# Patient Record
Sex: Male | Born: 1960 | Race: Black or African American | Hispanic: No | Marital: Married | Smoking: Former smoker
Health system: Southern US, Community
[De-identification: ages and names within clinical notes are randomized; demographics above are authoritative.]

## PROBLEM LIST (undated history)

## (undated) ENCOUNTER — Emergency Department (HOSPITAL_COMMUNITY): Discharge status: Absent without leave | Payer: Medicare Other

## (undated) DIAGNOSIS — M549 Dorsalgia, unspecified: Secondary | ICD-10-CM

## (undated) DIAGNOSIS — M1990 Unspecified osteoarthritis, unspecified site: Secondary | ICD-10-CM

## (undated) DIAGNOSIS — R51 Headache: Secondary | ICD-10-CM

## (undated) DIAGNOSIS — I82409 Acute embolism and thrombosis of unspecified deep veins of unspecified lower extremity: Secondary | ICD-10-CM

## (undated) DIAGNOSIS — E785 Hyperlipidemia, unspecified: Secondary | ICD-10-CM

## (undated) DIAGNOSIS — I10 Essential (primary) hypertension: Secondary | ICD-10-CM

## (undated) DIAGNOSIS — D649 Anemia, unspecified: Secondary | ICD-10-CM

## (undated) DIAGNOSIS — I509 Heart failure, unspecified: Secondary | ICD-10-CM

## (undated) DIAGNOSIS — I219 Acute myocardial infarction, unspecified: Secondary | ICD-10-CM

## (undated) DIAGNOSIS — N189 Chronic kidney disease, unspecified: Secondary | ICD-10-CM

## (undated) DIAGNOSIS — H548 Legal blindness, as defined in USA: Secondary | ICD-10-CM

## (undated) DIAGNOSIS — E119 Type 2 diabetes mellitus without complications: Secondary | ICD-10-CM

## (undated) DIAGNOSIS — K219 Gastro-esophageal reflux disease without esophagitis: Secondary | ICD-10-CM

## (undated) DIAGNOSIS — R519 Headache, unspecified: Secondary | ICD-10-CM

## (undated) DIAGNOSIS — Z9289 Personal history of other medical treatment: Secondary | ICD-10-CM

## (undated) HISTORY — PX: KIDNEY TRANSPLANT: SHX239

## (undated) HISTORY — PX: EYE SURGERY: SHX253

## (undated) HISTORY — DX: Chronic kidney disease, unspecified: N18.9

## (undated) HISTORY — DX: Essential (primary) hypertension: I10

## (undated) HISTORY — PX: OTHER SURGICAL HISTORY: SHX169

## (undated) HISTORY — DX: Heart failure, unspecified: I50.9

## (undated) HISTORY — DX: Hyperlipidemia, unspecified: E78.5

## (undated) HISTORY — PX: AV FISTULA PLACEMENT: SHX1204

## (undated) HISTORY — DX: Type 2 diabetes mellitus without complications: E11.9

## (undated) HISTORY — PX: NEPHRECTOMY TRANSPLANTED ORGAN: SUR880

## (undated) HISTORY — PX: REFRACTIVE SURGERY: SHX103

## (undated) HISTORY — PX: COLONOSCOPY: SHX174

---
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
## 2012-06-19 DIAGNOSIS — H3343 Traction detachment of retina, bilateral: Secondary | ICD-10-CM | POA: Insufficient documentation

## 2016-08-13 DIAGNOSIS — N2581 Secondary hyperparathyroidism of renal origin: Secondary | ICD-10-CM | POA: Diagnosis not present

## 2016-08-13 DIAGNOSIS — N186 End stage renal disease: Secondary | ICD-10-CM | POA: Diagnosis not present

## 2016-08-13 DIAGNOSIS — D631 Anemia in chronic kidney disease: Secondary | ICD-10-CM | POA: Diagnosis not present

## 2016-08-15 DIAGNOSIS — N186 End stage renal disease: Secondary | ICD-10-CM | POA: Diagnosis not present

## 2016-08-15 DIAGNOSIS — D631 Anemia in chronic kidney disease: Secondary | ICD-10-CM | POA: Diagnosis not present

## 2016-08-15 DIAGNOSIS — N2581 Secondary hyperparathyroidism of renal origin: Secondary | ICD-10-CM | POA: Diagnosis not present

## 2016-08-20 DIAGNOSIS — D631 Anemia in chronic kidney disease: Secondary | ICD-10-CM | POA: Diagnosis not present

## 2016-08-20 DIAGNOSIS — N2581 Secondary hyperparathyroidism of renal origin: Secondary | ICD-10-CM | POA: Diagnosis not present

## 2016-08-20 DIAGNOSIS — N186 End stage renal disease: Secondary | ICD-10-CM | POA: Diagnosis not present

## 2016-08-22 DIAGNOSIS — N186 End stage renal disease: Secondary | ICD-10-CM | POA: Diagnosis not present

## 2016-08-22 DIAGNOSIS — N2581 Secondary hyperparathyroidism of renal origin: Secondary | ICD-10-CM | POA: Diagnosis not present

## 2016-08-22 DIAGNOSIS — D631 Anemia in chronic kidney disease: Secondary | ICD-10-CM | POA: Diagnosis not present

## 2016-08-24 DIAGNOSIS — N186 End stage renal disease: Secondary | ICD-10-CM | POA: Diagnosis not present

## 2016-08-24 DIAGNOSIS — N2581 Secondary hyperparathyroidism of renal origin: Secondary | ICD-10-CM | POA: Diagnosis not present

## 2016-08-24 DIAGNOSIS — D631 Anemia in chronic kidney disease: Secondary | ICD-10-CM | POA: Diagnosis not present

## 2016-08-27 DIAGNOSIS — N186 End stage renal disease: Secondary | ICD-10-CM | POA: Diagnosis not present

## 2016-08-27 DIAGNOSIS — N2581 Secondary hyperparathyroidism of renal origin: Secondary | ICD-10-CM | POA: Diagnosis not present

## 2016-08-27 DIAGNOSIS — D631 Anemia in chronic kidney disease: Secondary | ICD-10-CM | POA: Diagnosis not present

## 2016-08-29 DIAGNOSIS — N186 End stage renal disease: Secondary | ICD-10-CM | POA: Diagnosis not present

## 2016-08-29 DIAGNOSIS — D631 Anemia in chronic kidney disease: Secondary | ICD-10-CM | POA: Diagnosis not present

## 2016-08-29 DIAGNOSIS — N2581 Secondary hyperparathyroidism of renal origin: Secondary | ICD-10-CM | POA: Diagnosis not present

## 2016-08-31 DIAGNOSIS — N2581 Secondary hyperparathyroidism of renal origin: Secondary | ICD-10-CM | POA: Diagnosis not present

## 2016-08-31 DIAGNOSIS — N186 End stage renal disease: Secondary | ICD-10-CM | POA: Diagnosis not present

## 2016-08-31 DIAGNOSIS — D631 Anemia in chronic kidney disease: Secondary | ICD-10-CM | POA: Diagnosis not present

## 2016-09-03 DIAGNOSIS — N2581 Secondary hyperparathyroidism of renal origin: Secondary | ICD-10-CM | POA: Diagnosis not present

## 2016-09-03 DIAGNOSIS — N186 End stage renal disease: Secondary | ICD-10-CM | POA: Diagnosis not present

## 2016-09-03 DIAGNOSIS — D631 Anemia in chronic kidney disease: Secondary | ICD-10-CM | POA: Diagnosis not present

## 2016-09-05 DIAGNOSIS — N2581 Secondary hyperparathyroidism of renal origin: Secondary | ICD-10-CM | POA: Diagnosis not present

## 2016-09-05 DIAGNOSIS — N186 End stage renal disease: Secondary | ICD-10-CM | POA: Diagnosis not present

## 2016-09-05 DIAGNOSIS — D631 Anemia in chronic kidney disease: Secondary | ICD-10-CM | POA: Diagnosis not present

## 2016-09-07 DIAGNOSIS — N2581 Secondary hyperparathyroidism of renal origin: Secondary | ICD-10-CM | POA: Diagnosis not present

## 2016-09-07 DIAGNOSIS — N186 End stage renal disease: Secondary | ICD-10-CM | POA: Diagnosis not present

## 2016-09-07 DIAGNOSIS — D631 Anemia in chronic kidney disease: Secondary | ICD-10-CM | POA: Diagnosis not present

## 2016-09-10 DIAGNOSIS — N186 End stage renal disease: Secondary | ICD-10-CM | POA: Diagnosis not present

## 2016-09-10 DIAGNOSIS — D631 Anemia in chronic kidney disease: Secondary | ICD-10-CM | POA: Diagnosis not present

## 2016-09-10 DIAGNOSIS — N2581 Secondary hyperparathyroidism of renal origin: Secondary | ICD-10-CM | POA: Diagnosis not present

## 2016-09-11 DIAGNOSIS — M549 Dorsalgia, unspecified: Secondary | ICD-10-CM | POA: Diagnosis not present

## 2016-09-11 DIAGNOSIS — M7581 Other shoulder lesions, right shoulder: Secondary | ICD-10-CM | POA: Diagnosis not present

## 2016-09-12 DIAGNOSIS — D631 Anemia in chronic kidney disease: Secondary | ICD-10-CM | POA: Diagnosis not present

## 2016-09-12 DIAGNOSIS — N186 End stage renal disease: Secondary | ICD-10-CM | POA: Diagnosis not present

## 2016-09-12 DIAGNOSIS — N2581 Secondary hyperparathyroidism of renal origin: Secondary | ICD-10-CM | POA: Diagnosis not present

## 2016-09-13 DIAGNOSIS — N186 End stage renal disease: Secondary | ICD-10-CM | POA: Diagnosis not present

## 2016-09-14 DIAGNOSIS — N2581 Secondary hyperparathyroidism of renal origin: Secondary | ICD-10-CM | POA: Diagnosis not present

## 2016-09-14 DIAGNOSIS — N186 End stage renal disease: Secondary | ICD-10-CM | POA: Diagnosis not present

## 2016-09-14 DIAGNOSIS — D631 Anemia in chronic kidney disease: Secondary | ICD-10-CM | POA: Diagnosis not present

## 2016-09-17 DIAGNOSIS — D631 Anemia in chronic kidney disease: Secondary | ICD-10-CM | POA: Diagnosis not present

## 2016-09-17 DIAGNOSIS — N186 End stage renal disease: Secondary | ICD-10-CM | POA: Diagnosis not present

## 2016-09-17 DIAGNOSIS — N2581 Secondary hyperparathyroidism of renal origin: Secondary | ICD-10-CM | POA: Diagnosis not present

## 2016-09-19 DIAGNOSIS — N2581 Secondary hyperparathyroidism of renal origin: Secondary | ICD-10-CM | POA: Diagnosis not present

## 2016-09-19 DIAGNOSIS — D631 Anemia in chronic kidney disease: Secondary | ICD-10-CM | POA: Diagnosis not present

## 2016-09-19 DIAGNOSIS — N186 End stage renal disease: Secondary | ICD-10-CM | POA: Diagnosis not present

## 2016-09-21 DIAGNOSIS — D631 Anemia in chronic kidney disease: Secondary | ICD-10-CM | POA: Diagnosis not present

## 2016-09-21 DIAGNOSIS — N2581 Secondary hyperparathyroidism of renal origin: Secondary | ICD-10-CM | POA: Diagnosis not present

## 2016-09-21 DIAGNOSIS — N186 End stage renal disease: Secondary | ICD-10-CM | POA: Diagnosis not present

## 2016-09-24 DIAGNOSIS — N2581 Secondary hyperparathyroidism of renal origin: Secondary | ICD-10-CM | POA: Diagnosis not present

## 2016-09-24 DIAGNOSIS — D631 Anemia in chronic kidney disease: Secondary | ICD-10-CM | POA: Diagnosis not present

## 2016-09-24 DIAGNOSIS — N186 End stage renal disease: Secondary | ICD-10-CM | POA: Diagnosis not present

## 2016-09-26 DIAGNOSIS — D631 Anemia in chronic kidney disease: Secondary | ICD-10-CM | POA: Diagnosis not present

## 2016-09-26 DIAGNOSIS — N2581 Secondary hyperparathyroidism of renal origin: Secondary | ICD-10-CM | POA: Diagnosis not present

## 2016-09-26 DIAGNOSIS — N186 End stage renal disease: Secondary | ICD-10-CM | POA: Diagnosis not present

## 2016-09-27 DIAGNOSIS — E113593 Type 2 diabetes mellitus with proliferative diabetic retinopathy without macular edema, bilateral: Secondary | ICD-10-CM | POA: Diagnosis not present

## 2016-09-27 DIAGNOSIS — Z961 Presence of intraocular lens: Secondary | ICD-10-CM | POA: Diagnosis not present

## 2016-09-28 DIAGNOSIS — N2581 Secondary hyperparathyroidism of renal origin: Secondary | ICD-10-CM | POA: Diagnosis not present

## 2016-09-28 DIAGNOSIS — N186 End stage renal disease: Secondary | ICD-10-CM | POA: Diagnosis not present

## 2016-09-28 DIAGNOSIS — D631 Anemia in chronic kidney disease: Secondary | ICD-10-CM | POA: Diagnosis not present

## 2016-10-01 DIAGNOSIS — N2581 Secondary hyperparathyroidism of renal origin: Secondary | ICD-10-CM | POA: Diagnosis not present

## 2016-10-01 DIAGNOSIS — N186 End stage renal disease: Secondary | ICD-10-CM | POA: Diagnosis not present

## 2016-10-01 DIAGNOSIS — D631 Anemia in chronic kidney disease: Secondary | ICD-10-CM | POA: Diagnosis not present

## 2016-10-03 DIAGNOSIS — N2581 Secondary hyperparathyroidism of renal origin: Secondary | ICD-10-CM | POA: Diagnosis not present

## 2016-10-03 DIAGNOSIS — D631 Anemia in chronic kidney disease: Secondary | ICD-10-CM | POA: Diagnosis not present

## 2016-10-03 DIAGNOSIS — N186 End stage renal disease: Secondary | ICD-10-CM | POA: Diagnosis not present

## 2016-10-05 DIAGNOSIS — N186 End stage renal disease: Secondary | ICD-10-CM | POA: Diagnosis not present

## 2016-10-05 DIAGNOSIS — D631 Anemia in chronic kidney disease: Secondary | ICD-10-CM | POA: Diagnosis not present

## 2016-10-05 DIAGNOSIS — N2581 Secondary hyperparathyroidism of renal origin: Secondary | ICD-10-CM | POA: Diagnosis not present

## 2016-10-08 DIAGNOSIS — N186 End stage renal disease: Secondary | ICD-10-CM | POA: Diagnosis not present

## 2016-10-08 DIAGNOSIS — N2581 Secondary hyperparathyroidism of renal origin: Secondary | ICD-10-CM | POA: Diagnosis not present

## 2016-10-08 DIAGNOSIS — D631 Anemia in chronic kidney disease: Secondary | ICD-10-CM | POA: Diagnosis not present

## 2016-10-10 DIAGNOSIS — N186 End stage renal disease: Secondary | ICD-10-CM | POA: Diagnosis not present

## 2016-10-10 DIAGNOSIS — N2581 Secondary hyperparathyroidism of renal origin: Secondary | ICD-10-CM | POA: Diagnosis not present

## 2016-10-10 DIAGNOSIS — D631 Anemia in chronic kidney disease: Secondary | ICD-10-CM | POA: Diagnosis not present

## 2016-10-16 DIAGNOSIS — E875 Hyperkalemia: Secondary | ICD-10-CM | POA: Diagnosis not present

## 2016-10-16 DIAGNOSIS — N2581 Secondary hyperparathyroidism of renal origin: Secondary | ICD-10-CM | POA: Diagnosis not present

## 2016-10-16 DIAGNOSIS — N186 End stage renal disease: Secondary | ICD-10-CM | POA: Diagnosis not present

## 2016-10-18 DIAGNOSIS — N2581 Secondary hyperparathyroidism of renal origin: Secondary | ICD-10-CM | POA: Diagnosis not present

## 2016-10-18 DIAGNOSIS — N186 End stage renal disease: Secondary | ICD-10-CM | POA: Diagnosis not present

## 2016-10-18 DIAGNOSIS — E875 Hyperkalemia: Secondary | ICD-10-CM | POA: Diagnosis not present

## 2016-10-23 DIAGNOSIS — Z992 Dependence on renal dialysis: Secondary | ICD-10-CM | POA: Diagnosis not present

## 2016-10-23 DIAGNOSIS — R109 Unspecified abdominal pain: Secondary | ICD-10-CM | POA: Diagnosis not present

## 2016-10-23 DIAGNOSIS — E875 Hyperkalemia: Secondary | ICD-10-CM | POA: Diagnosis not present

## 2016-10-23 DIAGNOSIS — E1122 Type 2 diabetes mellitus with diabetic chronic kidney disease: Secondary | ICD-10-CM | POA: Diagnosis not present

## 2016-10-23 DIAGNOSIS — I120 Hypertensive chronic kidney disease with stage 5 chronic kidney disease or end stage renal disease: Secondary | ICD-10-CM | POA: Diagnosis not present

## 2016-10-23 DIAGNOSIS — N2581 Secondary hyperparathyroidism of renal origin: Secondary | ICD-10-CM | POA: Diagnosis not present

## 2016-10-23 DIAGNOSIS — Z794 Long term (current) use of insulin: Secondary | ICD-10-CM | POA: Diagnosis not present

## 2016-10-23 DIAGNOSIS — N186 End stage renal disease: Secondary | ICD-10-CM | POA: Diagnosis not present

## 2016-10-23 DIAGNOSIS — M5416 Radiculopathy, lumbar region: Secondary | ICD-10-CM | POA: Diagnosis not present

## 2016-10-25 DIAGNOSIS — E875 Hyperkalemia: Secondary | ICD-10-CM | POA: Diagnosis not present

## 2016-10-25 DIAGNOSIS — N186 End stage renal disease: Secondary | ICD-10-CM | POA: Diagnosis not present

## 2016-10-25 DIAGNOSIS — N2581 Secondary hyperparathyroidism of renal origin: Secondary | ICD-10-CM | POA: Diagnosis not present

## 2016-10-27 DIAGNOSIS — E875 Hyperkalemia: Secondary | ICD-10-CM | POA: Diagnosis not present

## 2016-10-27 DIAGNOSIS — N186 End stage renal disease: Secondary | ICD-10-CM | POA: Diagnosis not present

## 2016-10-27 DIAGNOSIS — N2581 Secondary hyperparathyroidism of renal origin: Secondary | ICD-10-CM | POA: Diagnosis not present

## 2016-10-30 DIAGNOSIS — N186 End stage renal disease: Secondary | ICD-10-CM | POA: Diagnosis not present

## 2016-10-30 DIAGNOSIS — E875 Hyperkalemia: Secondary | ICD-10-CM | POA: Diagnosis not present

## 2016-10-30 DIAGNOSIS — N2581 Secondary hyperparathyroidism of renal origin: Secondary | ICD-10-CM | POA: Diagnosis not present

## 2016-11-01 DIAGNOSIS — E875 Hyperkalemia: Secondary | ICD-10-CM | POA: Diagnosis not present

## 2016-11-01 DIAGNOSIS — N186 End stage renal disease: Secondary | ICD-10-CM | POA: Diagnosis not present

## 2016-11-01 DIAGNOSIS — N2581 Secondary hyperparathyroidism of renal origin: Secondary | ICD-10-CM | POA: Diagnosis not present

## 2016-11-03 DIAGNOSIS — E875 Hyperkalemia: Secondary | ICD-10-CM | POA: Diagnosis not present

## 2016-11-03 DIAGNOSIS — N2581 Secondary hyperparathyroidism of renal origin: Secondary | ICD-10-CM | POA: Diagnosis not present

## 2016-11-03 DIAGNOSIS — N186 End stage renal disease: Secondary | ICD-10-CM | POA: Diagnosis not present

## 2016-11-06 DIAGNOSIS — N186 End stage renal disease: Secondary | ICD-10-CM | POA: Diagnosis not present

## 2016-11-06 DIAGNOSIS — N2581 Secondary hyperparathyroidism of renal origin: Secondary | ICD-10-CM | POA: Diagnosis not present

## 2016-11-06 DIAGNOSIS — E875 Hyperkalemia: Secondary | ICD-10-CM | POA: Diagnosis not present

## 2016-11-08 DIAGNOSIS — N2581 Secondary hyperparathyroidism of renal origin: Secondary | ICD-10-CM | POA: Diagnosis not present

## 2016-11-08 DIAGNOSIS — N186 End stage renal disease: Secondary | ICD-10-CM | POA: Diagnosis not present

## 2016-11-08 DIAGNOSIS — E875 Hyperkalemia: Secondary | ICD-10-CM | POA: Diagnosis not present

## 2016-11-10 DIAGNOSIS — Z992 Dependence on renal dialysis: Secondary | ICD-10-CM | POA: Diagnosis not present

## 2016-11-10 DIAGNOSIS — N2581 Secondary hyperparathyroidism of renal origin: Secondary | ICD-10-CM | POA: Diagnosis not present

## 2016-11-10 DIAGNOSIS — E875 Hyperkalemia: Secondary | ICD-10-CM | POA: Diagnosis not present

## 2016-11-10 DIAGNOSIS — N186 End stage renal disease: Secondary | ICD-10-CM | POA: Diagnosis not present

## 2016-11-13 DIAGNOSIS — N2581 Secondary hyperparathyroidism of renal origin: Secondary | ICD-10-CM | POA: Diagnosis not present

## 2016-11-13 DIAGNOSIS — Z23 Encounter for immunization: Secondary | ICD-10-CM | POA: Diagnosis not present

## 2016-11-13 DIAGNOSIS — N186 End stage renal disease: Secondary | ICD-10-CM | POA: Diagnosis not present

## 2016-11-15 DIAGNOSIS — N2581 Secondary hyperparathyroidism of renal origin: Secondary | ICD-10-CM | POA: Diagnosis not present

## 2016-11-15 DIAGNOSIS — Z23 Encounter for immunization: Secondary | ICD-10-CM | POA: Diagnosis not present

## 2016-11-15 DIAGNOSIS — N186 End stage renal disease: Secondary | ICD-10-CM | POA: Diagnosis not present

## 2016-11-17 DIAGNOSIS — N186 End stage renal disease: Secondary | ICD-10-CM | POA: Diagnosis not present

## 2016-11-17 DIAGNOSIS — Z23 Encounter for immunization: Secondary | ICD-10-CM | POA: Diagnosis not present

## 2016-11-17 DIAGNOSIS — N2581 Secondary hyperparathyroidism of renal origin: Secondary | ICD-10-CM | POA: Diagnosis not present

## 2016-11-20 DIAGNOSIS — Z23 Encounter for immunization: Secondary | ICD-10-CM | POA: Diagnosis not present

## 2016-11-20 DIAGNOSIS — N2581 Secondary hyperparathyroidism of renal origin: Secondary | ICD-10-CM | POA: Diagnosis not present

## 2016-11-20 DIAGNOSIS — N186 End stage renal disease: Secondary | ICD-10-CM | POA: Diagnosis not present

## 2016-11-22 DIAGNOSIS — N2581 Secondary hyperparathyroidism of renal origin: Secondary | ICD-10-CM | POA: Diagnosis not present

## 2016-11-22 DIAGNOSIS — N186 End stage renal disease: Secondary | ICD-10-CM | POA: Diagnosis not present

## 2016-11-22 DIAGNOSIS — Z23 Encounter for immunization: Secondary | ICD-10-CM | POA: Diagnosis not present

## 2016-11-24 DIAGNOSIS — Z23 Encounter for immunization: Secondary | ICD-10-CM | POA: Diagnosis not present

## 2016-11-24 DIAGNOSIS — N2581 Secondary hyperparathyroidism of renal origin: Secondary | ICD-10-CM | POA: Diagnosis not present

## 2016-11-24 DIAGNOSIS — N186 End stage renal disease: Secondary | ICD-10-CM | POA: Diagnosis not present

## 2016-11-27 DIAGNOSIS — Z23 Encounter for immunization: Secondary | ICD-10-CM | POA: Diagnosis not present

## 2016-11-27 DIAGNOSIS — N186 End stage renal disease: Secondary | ICD-10-CM | POA: Diagnosis not present

## 2016-11-27 DIAGNOSIS — N2581 Secondary hyperparathyroidism of renal origin: Secondary | ICD-10-CM | POA: Diagnosis not present

## 2016-11-29 DIAGNOSIS — N186 End stage renal disease: Secondary | ICD-10-CM | POA: Diagnosis not present

## 2016-11-29 DIAGNOSIS — N2581 Secondary hyperparathyroidism of renal origin: Secondary | ICD-10-CM | POA: Diagnosis not present

## 2016-11-29 DIAGNOSIS — Z23 Encounter for immunization: Secondary | ICD-10-CM | POA: Diagnosis not present

## 2016-12-04 DIAGNOSIS — N186 End stage renal disease: Secondary | ICD-10-CM | POA: Diagnosis not present

## 2016-12-04 DIAGNOSIS — N2581 Secondary hyperparathyroidism of renal origin: Secondary | ICD-10-CM | POA: Diagnosis not present

## 2016-12-04 DIAGNOSIS — Z23 Encounter for immunization: Secondary | ICD-10-CM | POA: Diagnosis not present

## 2016-12-06 DIAGNOSIS — N186 End stage renal disease: Secondary | ICD-10-CM | POA: Diagnosis not present

## 2016-12-06 DIAGNOSIS — N2581 Secondary hyperparathyroidism of renal origin: Secondary | ICD-10-CM | POA: Diagnosis not present

## 2016-12-06 DIAGNOSIS — Z23 Encounter for immunization: Secondary | ICD-10-CM | POA: Diagnosis not present

## 2016-12-08 DIAGNOSIS — N186 End stage renal disease: Secondary | ICD-10-CM | POA: Diagnosis not present

## 2016-12-08 DIAGNOSIS — Z23 Encounter for immunization: Secondary | ICD-10-CM | POA: Diagnosis not present

## 2016-12-08 DIAGNOSIS — N2581 Secondary hyperparathyroidism of renal origin: Secondary | ICD-10-CM | POA: Diagnosis not present

## 2016-12-10 DIAGNOSIS — Z992 Dependence on renal dialysis: Secondary | ICD-10-CM | POA: Diagnosis not present

## 2016-12-10 DIAGNOSIS — N186 End stage renal disease: Secondary | ICD-10-CM | POA: Diagnosis not present

## 2016-12-13 DIAGNOSIS — Z23 Encounter for immunization: Secondary | ICD-10-CM | POA: Diagnosis not present

## 2016-12-13 DIAGNOSIS — D631 Anemia in chronic kidney disease: Secondary | ICD-10-CM | POA: Diagnosis not present

## 2016-12-13 DIAGNOSIS — N2581 Secondary hyperparathyroidism of renal origin: Secondary | ICD-10-CM | POA: Diagnosis not present

## 2016-12-13 DIAGNOSIS — N186 End stage renal disease: Secondary | ICD-10-CM | POA: Diagnosis not present

## 2016-12-15 DIAGNOSIS — D631 Anemia in chronic kidney disease: Secondary | ICD-10-CM | POA: Diagnosis not present

## 2016-12-15 DIAGNOSIS — N186 End stage renal disease: Secondary | ICD-10-CM | POA: Diagnosis not present

## 2016-12-15 DIAGNOSIS — Z23 Encounter for immunization: Secondary | ICD-10-CM | POA: Diagnosis not present

## 2016-12-15 DIAGNOSIS — N2581 Secondary hyperparathyroidism of renal origin: Secondary | ICD-10-CM | POA: Diagnosis not present

## 2016-12-18 DIAGNOSIS — D631 Anemia in chronic kidney disease: Secondary | ICD-10-CM | POA: Diagnosis not present

## 2016-12-18 DIAGNOSIS — Z23 Encounter for immunization: Secondary | ICD-10-CM | POA: Diagnosis not present

## 2016-12-18 DIAGNOSIS — N2581 Secondary hyperparathyroidism of renal origin: Secondary | ICD-10-CM | POA: Diagnosis not present

## 2016-12-18 DIAGNOSIS — N186 End stage renal disease: Secondary | ICD-10-CM | POA: Diagnosis not present

## 2016-12-20 DIAGNOSIS — D631 Anemia in chronic kidney disease: Secondary | ICD-10-CM | POA: Diagnosis not present

## 2016-12-20 DIAGNOSIS — N186 End stage renal disease: Secondary | ICD-10-CM | POA: Diagnosis not present

## 2016-12-20 DIAGNOSIS — Z23 Encounter for immunization: Secondary | ICD-10-CM | POA: Diagnosis not present

## 2016-12-20 DIAGNOSIS — N2581 Secondary hyperparathyroidism of renal origin: Secondary | ICD-10-CM | POA: Diagnosis not present

## 2016-12-22 DIAGNOSIS — Z23 Encounter for immunization: Secondary | ICD-10-CM | POA: Diagnosis not present

## 2016-12-22 DIAGNOSIS — D631 Anemia in chronic kidney disease: Secondary | ICD-10-CM | POA: Diagnosis not present

## 2016-12-22 DIAGNOSIS — N186 End stage renal disease: Secondary | ICD-10-CM | POA: Diagnosis not present

## 2016-12-22 DIAGNOSIS — N2581 Secondary hyperparathyroidism of renal origin: Secondary | ICD-10-CM | POA: Diagnosis not present

## 2016-12-25 DIAGNOSIS — N2581 Secondary hyperparathyroidism of renal origin: Secondary | ICD-10-CM | POA: Diagnosis not present

## 2016-12-25 DIAGNOSIS — N186 End stage renal disease: Secondary | ICD-10-CM | POA: Diagnosis not present

## 2016-12-25 DIAGNOSIS — D631 Anemia in chronic kidney disease: Secondary | ICD-10-CM | POA: Diagnosis not present

## 2016-12-25 DIAGNOSIS — Z23 Encounter for immunization: Secondary | ICD-10-CM | POA: Diagnosis not present

## 2016-12-27 DIAGNOSIS — N186 End stage renal disease: Secondary | ICD-10-CM | POA: Diagnosis not present

## 2016-12-27 DIAGNOSIS — N2581 Secondary hyperparathyroidism of renal origin: Secondary | ICD-10-CM | POA: Diagnosis not present

## 2016-12-27 DIAGNOSIS — D631 Anemia in chronic kidney disease: Secondary | ICD-10-CM | POA: Diagnosis not present

## 2016-12-27 DIAGNOSIS — Z23 Encounter for immunization: Secondary | ICD-10-CM | POA: Diagnosis not present

## 2016-12-29 DIAGNOSIS — Z23 Encounter for immunization: Secondary | ICD-10-CM | POA: Diagnosis not present

## 2016-12-29 DIAGNOSIS — N186 End stage renal disease: Secondary | ICD-10-CM | POA: Diagnosis not present

## 2016-12-29 DIAGNOSIS — N2581 Secondary hyperparathyroidism of renal origin: Secondary | ICD-10-CM | POA: Diagnosis not present

## 2016-12-29 DIAGNOSIS — D631 Anemia in chronic kidney disease: Secondary | ICD-10-CM | POA: Diagnosis not present

## 2017-01-01 DIAGNOSIS — N2581 Secondary hyperparathyroidism of renal origin: Secondary | ICD-10-CM | POA: Diagnosis not present

## 2017-01-01 DIAGNOSIS — D631 Anemia in chronic kidney disease: Secondary | ICD-10-CM | POA: Diagnosis not present

## 2017-01-01 DIAGNOSIS — N186 End stage renal disease: Secondary | ICD-10-CM | POA: Diagnosis not present

## 2017-01-01 DIAGNOSIS — Z23 Encounter for immunization: Secondary | ICD-10-CM | POA: Diagnosis not present

## 2017-01-03 DIAGNOSIS — N2581 Secondary hyperparathyroidism of renal origin: Secondary | ICD-10-CM | POA: Diagnosis not present

## 2017-01-03 DIAGNOSIS — D631 Anemia in chronic kidney disease: Secondary | ICD-10-CM | POA: Diagnosis not present

## 2017-01-03 DIAGNOSIS — Z23 Encounter for immunization: Secondary | ICD-10-CM | POA: Diagnosis not present

## 2017-01-03 DIAGNOSIS — N186 End stage renal disease: Secondary | ICD-10-CM | POA: Diagnosis not present

## 2017-01-04 DIAGNOSIS — Z79899 Other long term (current) drug therapy: Secondary | ICD-10-CM | POA: Diagnosis not present

## 2017-01-04 DIAGNOSIS — E11319 Type 2 diabetes mellitus with unspecified diabetic retinopathy without macular edema: Secondary | ICD-10-CM | POA: Diagnosis not present

## 2017-01-04 DIAGNOSIS — K604 Rectal fistula: Secondary | ICD-10-CM | POA: Diagnosis not present

## 2017-01-04 DIAGNOSIS — Z992 Dependence on renal dialysis: Secondary | ICD-10-CM | POA: Diagnosis not present

## 2017-01-04 DIAGNOSIS — E785 Hyperlipidemia, unspecified: Secondary | ICD-10-CM | POA: Diagnosis not present

## 2017-01-04 DIAGNOSIS — N186 End stage renal disease: Secondary | ICD-10-CM | POA: Diagnosis not present

## 2017-01-04 DIAGNOSIS — Z87891 Personal history of nicotine dependence: Secondary | ICD-10-CM | POA: Diagnosis not present

## 2017-01-04 DIAGNOSIS — K610 Anal abscess: Secondary | ICD-10-CM | POA: Diagnosis not present

## 2017-01-04 DIAGNOSIS — I2510 Atherosclerotic heart disease of native coronary artery without angina pectoris: Secondary | ICD-10-CM | POA: Diagnosis not present

## 2017-01-04 DIAGNOSIS — K605 Anorectal fistula: Secondary | ICD-10-CM | POA: Diagnosis not present

## 2017-01-04 DIAGNOSIS — Z794 Long term (current) use of insulin: Secondary | ICD-10-CM | POA: Diagnosis not present

## 2017-01-04 DIAGNOSIS — E1122 Type 2 diabetes mellitus with diabetic chronic kidney disease: Secondary | ICD-10-CM | POA: Diagnosis not present

## 2017-01-04 DIAGNOSIS — I120 Hypertensive chronic kidney disease with stage 5 chronic kidney disease or end stage renal disease: Secondary | ICD-10-CM | POA: Diagnosis not present

## 2017-01-08 DIAGNOSIS — N2581 Secondary hyperparathyroidism of renal origin: Secondary | ICD-10-CM | POA: Diagnosis not present

## 2017-01-08 DIAGNOSIS — N186 End stage renal disease: Secondary | ICD-10-CM | POA: Diagnosis not present

## 2017-01-08 DIAGNOSIS — D631 Anemia in chronic kidney disease: Secondary | ICD-10-CM | POA: Diagnosis not present

## 2017-01-08 DIAGNOSIS — Z23 Encounter for immunization: Secondary | ICD-10-CM | POA: Diagnosis not present

## 2017-01-10 DIAGNOSIS — Z992 Dependence on renal dialysis: Secondary | ICD-10-CM | POA: Diagnosis not present

## 2017-01-10 DIAGNOSIS — N186 End stage renal disease: Secondary | ICD-10-CM | POA: Diagnosis not present

## 2017-01-10 DIAGNOSIS — N2581 Secondary hyperparathyroidism of renal origin: Secondary | ICD-10-CM | POA: Diagnosis not present

## 2017-01-10 DIAGNOSIS — D631 Anemia in chronic kidney disease: Secondary | ICD-10-CM | POA: Diagnosis not present

## 2017-01-10 DIAGNOSIS — Z23 Encounter for immunization: Secondary | ICD-10-CM | POA: Diagnosis not present

## 2017-01-12 DIAGNOSIS — N2581 Secondary hyperparathyroidism of renal origin: Secondary | ICD-10-CM | POA: Diagnosis not present

## 2017-01-12 DIAGNOSIS — D631 Anemia in chronic kidney disease: Secondary | ICD-10-CM | POA: Diagnosis not present

## 2017-01-12 DIAGNOSIS — N186 End stage renal disease: Secondary | ICD-10-CM | POA: Diagnosis not present

## 2017-01-15 DIAGNOSIS — N2581 Secondary hyperparathyroidism of renal origin: Secondary | ICD-10-CM | POA: Diagnosis not present

## 2017-01-15 DIAGNOSIS — N186 End stage renal disease: Secondary | ICD-10-CM | POA: Diagnosis not present

## 2017-01-15 DIAGNOSIS — D631 Anemia in chronic kidney disease: Secondary | ICD-10-CM | POA: Diagnosis not present

## 2017-01-17 DIAGNOSIS — N2581 Secondary hyperparathyroidism of renal origin: Secondary | ICD-10-CM | POA: Diagnosis not present

## 2017-01-17 DIAGNOSIS — N186 End stage renal disease: Secondary | ICD-10-CM | POA: Diagnosis not present

## 2017-01-17 DIAGNOSIS — D631 Anemia in chronic kidney disease: Secondary | ICD-10-CM | POA: Diagnosis not present

## 2017-01-19 DIAGNOSIS — D631 Anemia in chronic kidney disease: Secondary | ICD-10-CM | POA: Diagnosis not present

## 2017-01-19 DIAGNOSIS — N2581 Secondary hyperparathyroidism of renal origin: Secondary | ICD-10-CM | POA: Diagnosis not present

## 2017-01-19 DIAGNOSIS — N186 End stage renal disease: Secondary | ICD-10-CM | POA: Diagnosis not present

## 2017-01-22 DIAGNOSIS — D631 Anemia in chronic kidney disease: Secondary | ICD-10-CM | POA: Diagnosis not present

## 2017-01-22 DIAGNOSIS — N186 End stage renal disease: Secondary | ICD-10-CM | POA: Diagnosis not present

## 2017-01-22 DIAGNOSIS — N2581 Secondary hyperparathyroidism of renal origin: Secondary | ICD-10-CM | POA: Diagnosis not present

## 2017-01-24 DIAGNOSIS — D631 Anemia in chronic kidney disease: Secondary | ICD-10-CM | POA: Diagnosis not present

## 2017-01-24 DIAGNOSIS — N2581 Secondary hyperparathyroidism of renal origin: Secondary | ICD-10-CM | POA: Diagnosis not present

## 2017-01-24 DIAGNOSIS — N186 End stage renal disease: Secondary | ICD-10-CM | POA: Diagnosis not present

## 2017-01-26 DIAGNOSIS — N2581 Secondary hyperparathyroidism of renal origin: Secondary | ICD-10-CM | POA: Diagnosis not present

## 2017-01-26 DIAGNOSIS — N186 End stage renal disease: Secondary | ICD-10-CM | POA: Diagnosis not present

## 2017-01-26 DIAGNOSIS — D631 Anemia in chronic kidney disease: Secondary | ICD-10-CM | POA: Diagnosis not present

## 2017-01-29 DIAGNOSIS — N186 End stage renal disease: Secondary | ICD-10-CM | POA: Diagnosis not present

## 2017-01-29 DIAGNOSIS — N2581 Secondary hyperparathyroidism of renal origin: Secondary | ICD-10-CM | POA: Diagnosis not present

## 2017-01-29 DIAGNOSIS — D631 Anemia in chronic kidney disease: Secondary | ICD-10-CM | POA: Diagnosis not present

## 2017-01-31 DIAGNOSIS — N186 End stage renal disease: Secondary | ICD-10-CM | POA: Diagnosis not present

## 2017-01-31 DIAGNOSIS — D631 Anemia in chronic kidney disease: Secondary | ICD-10-CM | POA: Diagnosis not present

## 2017-01-31 DIAGNOSIS — N2581 Secondary hyperparathyroidism of renal origin: Secondary | ICD-10-CM | POA: Diagnosis not present

## 2017-02-02 DIAGNOSIS — D631 Anemia in chronic kidney disease: Secondary | ICD-10-CM | POA: Diagnosis not present

## 2017-02-02 DIAGNOSIS — N186 End stage renal disease: Secondary | ICD-10-CM | POA: Diagnosis not present

## 2017-02-02 DIAGNOSIS — N2581 Secondary hyperparathyroidism of renal origin: Secondary | ICD-10-CM | POA: Diagnosis not present

## 2017-02-05 DIAGNOSIS — N186 End stage renal disease: Secondary | ICD-10-CM | POA: Diagnosis not present

## 2017-02-05 DIAGNOSIS — N2581 Secondary hyperparathyroidism of renal origin: Secondary | ICD-10-CM | POA: Diagnosis not present

## 2017-02-05 DIAGNOSIS — D631 Anemia in chronic kidney disease: Secondary | ICD-10-CM | POA: Diagnosis not present

## 2017-02-07 DIAGNOSIS — D631 Anemia in chronic kidney disease: Secondary | ICD-10-CM | POA: Diagnosis not present

## 2017-02-07 DIAGNOSIS — N186 End stage renal disease: Secondary | ICD-10-CM | POA: Diagnosis not present

## 2017-02-07 DIAGNOSIS — N2581 Secondary hyperparathyroidism of renal origin: Secondary | ICD-10-CM | POA: Diagnosis not present

## 2017-02-09 DIAGNOSIS — N186 End stage renal disease: Secondary | ICD-10-CM | POA: Diagnosis not present

## 2017-02-09 DIAGNOSIS — N2581 Secondary hyperparathyroidism of renal origin: Secondary | ICD-10-CM | POA: Diagnosis not present

## 2017-02-09 DIAGNOSIS — D631 Anemia in chronic kidney disease: Secondary | ICD-10-CM | POA: Diagnosis not present

## 2017-02-09 DIAGNOSIS — Z992 Dependence on renal dialysis: Secondary | ICD-10-CM | POA: Diagnosis not present

## 2017-02-12 DIAGNOSIS — N2581 Secondary hyperparathyroidism of renal origin: Secondary | ICD-10-CM | POA: Diagnosis not present

## 2017-02-12 DIAGNOSIS — D631 Anemia in chronic kidney disease: Secondary | ICD-10-CM | POA: Diagnosis not present

## 2017-02-12 DIAGNOSIS — N186 End stage renal disease: Secondary | ICD-10-CM | POA: Diagnosis not present

## 2017-02-12 DIAGNOSIS — Z23 Encounter for immunization: Secondary | ICD-10-CM | POA: Diagnosis not present

## 2017-02-14 DIAGNOSIS — Z23 Encounter for immunization: Secondary | ICD-10-CM | POA: Diagnosis not present

## 2017-02-14 DIAGNOSIS — N186 End stage renal disease: Secondary | ICD-10-CM | POA: Diagnosis not present

## 2017-02-14 DIAGNOSIS — N2581 Secondary hyperparathyroidism of renal origin: Secondary | ICD-10-CM | POA: Diagnosis not present

## 2017-02-14 DIAGNOSIS — D631 Anemia in chronic kidney disease: Secondary | ICD-10-CM | POA: Diagnosis not present

## 2017-02-16 DIAGNOSIS — Z23 Encounter for immunization: Secondary | ICD-10-CM | POA: Diagnosis not present

## 2017-02-16 DIAGNOSIS — D631 Anemia in chronic kidney disease: Secondary | ICD-10-CM | POA: Diagnosis not present

## 2017-02-16 DIAGNOSIS — N186 End stage renal disease: Secondary | ICD-10-CM | POA: Diagnosis not present

## 2017-02-16 DIAGNOSIS — N2581 Secondary hyperparathyroidism of renal origin: Secondary | ICD-10-CM | POA: Diagnosis not present

## 2017-02-19 DIAGNOSIS — Z23 Encounter for immunization: Secondary | ICD-10-CM | POA: Diagnosis not present

## 2017-02-19 DIAGNOSIS — N186 End stage renal disease: Secondary | ICD-10-CM | POA: Diagnosis not present

## 2017-02-19 DIAGNOSIS — D631 Anemia in chronic kidney disease: Secondary | ICD-10-CM | POA: Diagnosis not present

## 2017-02-19 DIAGNOSIS — N2581 Secondary hyperparathyroidism of renal origin: Secondary | ICD-10-CM | POA: Diagnosis not present

## 2017-02-21 DIAGNOSIS — D631 Anemia in chronic kidney disease: Secondary | ICD-10-CM | POA: Diagnosis not present

## 2017-02-21 DIAGNOSIS — N2581 Secondary hyperparathyroidism of renal origin: Secondary | ICD-10-CM | POA: Diagnosis not present

## 2017-02-21 DIAGNOSIS — Z23 Encounter for immunization: Secondary | ICD-10-CM | POA: Diagnosis not present

## 2017-02-21 DIAGNOSIS — N186 End stage renal disease: Secondary | ICD-10-CM | POA: Diagnosis not present

## 2017-02-23 DIAGNOSIS — D631 Anemia in chronic kidney disease: Secondary | ICD-10-CM | POA: Diagnosis not present

## 2017-02-23 DIAGNOSIS — N186 End stage renal disease: Secondary | ICD-10-CM | POA: Diagnosis not present

## 2017-02-23 DIAGNOSIS — N2581 Secondary hyperparathyroidism of renal origin: Secondary | ICD-10-CM | POA: Diagnosis not present

## 2017-02-23 DIAGNOSIS — Z23 Encounter for immunization: Secondary | ICD-10-CM | POA: Diagnosis not present

## 2017-02-26 DIAGNOSIS — D631 Anemia in chronic kidney disease: Secondary | ICD-10-CM | POA: Diagnosis not present

## 2017-02-26 DIAGNOSIS — N186 End stage renal disease: Secondary | ICD-10-CM | POA: Diagnosis not present

## 2017-02-26 DIAGNOSIS — N2581 Secondary hyperparathyroidism of renal origin: Secondary | ICD-10-CM | POA: Diagnosis not present

## 2017-02-26 DIAGNOSIS — Z23 Encounter for immunization: Secondary | ICD-10-CM | POA: Diagnosis not present

## 2017-02-28 DIAGNOSIS — N2581 Secondary hyperparathyroidism of renal origin: Secondary | ICD-10-CM | POA: Diagnosis not present

## 2017-02-28 DIAGNOSIS — N186 End stage renal disease: Secondary | ICD-10-CM | POA: Diagnosis not present

## 2017-02-28 DIAGNOSIS — Z23 Encounter for immunization: Secondary | ICD-10-CM | POA: Diagnosis not present

## 2017-02-28 DIAGNOSIS — D631 Anemia in chronic kidney disease: Secondary | ICD-10-CM | POA: Diagnosis not present

## 2017-03-02 DIAGNOSIS — D631 Anemia in chronic kidney disease: Secondary | ICD-10-CM | POA: Diagnosis not present

## 2017-03-02 DIAGNOSIS — N186 End stage renal disease: Secondary | ICD-10-CM | POA: Diagnosis not present

## 2017-03-02 DIAGNOSIS — N2581 Secondary hyperparathyroidism of renal origin: Secondary | ICD-10-CM | POA: Diagnosis not present

## 2017-03-02 DIAGNOSIS — Z23 Encounter for immunization: Secondary | ICD-10-CM | POA: Diagnosis not present

## 2017-03-05 DIAGNOSIS — N186 End stage renal disease: Secondary | ICD-10-CM | POA: Diagnosis not present

## 2017-03-05 DIAGNOSIS — D631 Anemia in chronic kidney disease: Secondary | ICD-10-CM | POA: Diagnosis not present

## 2017-03-05 DIAGNOSIS — N2581 Secondary hyperparathyroidism of renal origin: Secondary | ICD-10-CM | POA: Diagnosis not present

## 2017-03-05 DIAGNOSIS — Z23 Encounter for immunization: Secondary | ICD-10-CM | POA: Diagnosis not present

## 2017-03-07 DIAGNOSIS — N2581 Secondary hyperparathyroidism of renal origin: Secondary | ICD-10-CM | POA: Diagnosis not present

## 2017-03-07 DIAGNOSIS — N186 End stage renal disease: Secondary | ICD-10-CM | POA: Diagnosis not present

## 2017-03-07 DIAGNOSIS — Z23 Encounter for immunization: Secondary | ICD-10-CM | POA: Diagnosis not present

## 2017-03-07 DIAGNOSIS — D631 Anemia in chronic kidney disease: Secondary | ICD-10-CM | POA: Diagnosis not present

## 2017-03-08 DIAGNOSIS — E103553 Type 1 diabetes mellitus with stable proliferative diabetic retinopathy, bilateral: Secondary | ICD-10-CM | POA: Diagnosis not present

## 2017-03-08 DIAGNOSIS — Z961 Presence of intraocular lens: Secondary | ICD-10-CM | POA: Diagnosis not present

## 2017-03-08 DIAGNOSIS — H52223 Regular astigmatism, bilateral: Secondary | ICD-10-CM | POA: Diagnosis not present

## 2017-03-08 DIAGNOSIS — H5203 Hypermetropia, bilateral: Secondary | ICD-10-CM | POA: Diagnosis not present

## 2017-03-09 DIAGNOSIS — Z23 Encounter for immunization: Secondary | ICD-10-CM | POA: Diagnosis not present

## 2017-03-09 DIAGNOSIS — N186 End stage renal disease: Secondary | ICD-10-CM | POA: Diagnosis not present

## 2017-03-09 DIAGNOSIS — N2581 Secondary hyperparathyroidism of renal origin: Secondary | ICD-10-CM | POA: Diagnosis not present

## 2017-03-09 DIAGNOSIS — D631 Anemia in chronic kidney disease: Secondary | ICD-10-CM | POA: Diagnosis not present

## 2017-03-12 DIAGNOSIS — Z992 Dependence on renal dialysis: Secondary | ICD-10-CM | POA: Diagnosis not present

## 2017-03-12 DIAGNOSIS — N186 End stage renal disease: Secondary | ICD-10-CM | POA: Diagnosis not present

## 2017-03-14 DIAGNOSIS — D631 Anemia in chronic kidney disease: Secondary | ICD-10-CM | POA: Diagnosis not present

## 2017-03-14 DIAGNOSIS — D509 Iron deficiency anemia, unspecified: Secondary | ICD-10-CM | POA: Diagnosis not present

## 2017-03-14 DIAGNOSIS — N186 End stage renal disease: Secondary | ICD-10-CM | POA: Diagnosis not present

## 2017-03-14 DIAGNOSIS — E875 Hyperkalemia: Secondary | ICD-10-CM | POA: Diagnosis not present

## 2017-03-14 DIAGNOSIS — N2581 Secondary hyperparathyroidism of renal origin: Secondary | ICD-10-CM | POA: Diagnosis not present

## 2017-03-16 DIAGNOSIS — N2581 Secondary hyperparathyroidism of renal origin: Secondary | ICD-10-CM | POA: Diagnosis not present

## 2017-03-16 DIAGNOSIS — E875 Hyperkalemia: Secondary | ICD-10-CM | POA: Diagnosis not present

## 2017-03-16 DIAGNOSIS — D631 Anemia in chronic kidney disease: Secondary | ICD-10-CM | POA: Diagnosis not present

## 2017-03-16 DIAGNOSIS — D509 Iron deficiency anemia, unspecified: Secondary | ICD-10-CM | POA: Diagnosis not present

## 2017-03-16 DIAGNOSIS — N186 End stage renal disease: Secondary | ICD-10-CM | POA: Diagnosis not present

## 2017-03-18 DIAGNOSIS — Z992 Dependence on renal dialysis: Secondary | ICD-10-CM | POA: Diagnosis not present

## 2017-03-18 DIAGNOSIS — I10 Essential (primary) hypertension: Secondary | ICD-10-CM | POA: Diagnosis not present

## 2017-03-18 DIAGNOSIS — N186 End stage renal disease: Secondary | ICD-10-CM | POA: Diagnosis not present

## 2017-03-18 DIAGNOSIS — E1021 Type 1 diabetes mellitus with diabetic nephropathy: Secondary | ICD-10-CM | POA: Diagnosis not present

## 2017-03-19 DIAGNOSIS — N186 End stage renal disease: Secondary | ICD-10-CM | POA: Diagnosis not present

## 2017-03-19 DIAGNOSIS — E875 Hyperkalemia: Secondary | ICD-10-CM | POA: Diagnosis not present

## 2017-03-19 DIAGNOSIS — D509 Iron deficiency anemia, unspecified: Secondary | ICD-10-CM | POA: Diagnosis not present

## 2017-03-19 DIAGNOSIS — N2581 Secondary hyperparathyroidism of renal origin: Secondary | ICD-10-CM | POA: Diagnosis not present

## 2017-03-19 DIAGNOSIS — D631 Anemia in chronic kidney disease: Secondary | ICD-10-CM | POA: Diagnosis not present

## 2017-03-21 DIAGNOSIS — N186 End stage renal disease: Secondary | ICD-10-CM | POA: Diagnosis not present

## 2017-03-21 DIAGNOSIS — N2581 Secondary hyperparathyroidism of renal origin: Secondary | ICD-10-CM | POA: Diagnosis not present

## 2017-03-21 DIAGNOSIS — E875 Hyperkalemia: Secondary | ICD-10-CM | POA: Diagnosis not present

## 2017-03-21 DIAGNOSIS — D509 Iron deficiency anemia, unspecified: Secondary | ICD-10-CM | POA: Diagnosis not present

## 2017-03-21 DIAGNOSIS — D631 Anemia in chronic kidney disease: Secondary | ICD-10-CM | POA: Diagnosis not present

## 2017-03-23 DIAGNOSIS — N2581 Secondary hyperparathyroidism of renal origin: Secondary | ICD-10-CM | POA: Diagnosis not present

## 2017-03-23 DIAGNOSIS — N186 End stage renal disease: Secondary | ICD-10-CM | POA: Diagnosis not present

## 2017-03-23 DIAGNOSIS — E875 Hyperkalemia: Secondary | ICD-10-CM | POA: Diagnosis not present

## 2017-03-23 DIAGNOSIS — D509 Iron deficiency anemia, unspecified: Secondary | ICD-10-CM | POA: Diagnosis not present

## 2017-03-23 DIAGNOSIS — D631 Anemia in chronic kidney disease: Secondary | ICD-10-CM | POA: Diagnosis not present

## 2017-03-26 DIAGNOSIS — N186 End stage renal disease: Secondary | ICD-10-CM | POA: Diagnosis not present

## 2017-03-26 DIAGNOSIS — E875 Hyperkalemia: Secondary | ICD-10-CM | POA: Diagnosis not present

## 2017-03-26 DIAGNOSIS — D509 Iron deficiency anemia, unspecified: Secondary | ICD-10-CM | POA: Diagnosis not present

## 2017-03-26 DIAGNOSIS — D631 Anemia in chronic kidney disease: Secondary | ICD-10-CM | POA: Diagnosis not present

## 2017-03-26 DIAGNOSIS — N2581 Secondary hyperparathyroidism of renal origin: Secondary | ICD-10-CM | POA: Diagnosis not present

## 2017-03-28 DIAGNOSIS — N2581 Secondary hyperparathyroidism of renal origin: Secondary | ICD-10-CM | POA: Diagnosis not present

## 2017-03-28 DIAGNOSIS — D631 Anemia in chronic kidney disease: Secondary | ICD-10-CM | POA: Diagnosis not present

## 2017-03-28 DIAGNOSIS — E875 Hyperkalemia: Secondary | ICD-10-CM | POA: Diagnosis not present

## 2017-03-28 DIAGNOSIS — D509 Iron deficiency anemia, unspecified: Secondary | ICD-10-CM | POA: Diagnosis not present

## 2017-03-28 DIAGNOSIS — N186 End stage renal disease: Secondary | ICD-10-CM | POA: Diagnosis not present

## 2017-03-30 DIAGNOSIS — N2581 Secondary hyperparathyroidism of renal origin: Secondary | ICD-10-CM | POA: Diagnosis not present

## 2017-03-30 DIAGNOSIS — E875 Hyperkalemia: Secondary | ICD-10-CM | POA: Diagnosis not present

## 2017-03-30 DIAGNOSIS — D509 Iron deficiency anemia, unspecified: Secondary | ICD-10-CM | POA: Diagnosis not present

## 2017-03-30 DIAGNOSIS — N186 End stage renal disease: Secondary | ICD-10-CM | POA: Diagnosis not present

## 2017-03-30 DIAGNOSIS — D631 Anemia in chronic kidney disease: Secondary | ICD-10-CM | POA: Diagnosis not present

## 2017-04-02 DIAGNOSIS — N186 End stage renal disease: Secondary | ICD-10-CM | POA: Diagnosis not present

## 2017-04-02 DIAGNOSIS — D631 Anemia in chronic kidney disease: Secondary | ICD-10-CM | POA: Diagnosis not present

## 2017-04-02 DIAGNOSIS — E875 Hyperkalemia: Secondary | ICD-10-CM | POA: Diagnosis not present

## 2017-04-02 DIAGNOSIS — D509 Iron deficiency anemia, unspecified: Secondary | ICD-10-CM | POA: Diagnosis not present

## 2017-04-02 DIAGNOSIS — N2581 Secondary hyperparathyroidism of renal origin: Secondary | ICD-10-CM | POA: Diagnosis not present

## 2017-04-04 DIAGNOSIS — D509 Iron deficiency anemia, unspecified: Secondary | ICD-10-CM | POA: Diagnosis not present

## 2017-04-04 DIAGNOSIS — E875 Hyperkalemia: Secondary | ICD-10-CM | POA: Diagnosis not present

## 2017-04-04 DIAGNOSIS — N186 End stage renal disease: Secondary | ICD-10-CM | POA: Diagnosis not present

## 2017-04-04 DIAGNOSIS — N2581 Secondary hyperparathyroidism of renal origin: Secondary | ICD-10-CM | POA: Diagnosis not present

## 2017-04-04 DIAGNOSIS — D631 Anemia in chronic kidney disease: Secondary | ICD-10-CM | POA: Diagnosis not present

## 2017-04-06 DIAGNOSIS — E875 Hyperkalemia: Secondary | ICD-10-CM | POA: Diagnosis not present

## 2017-04-06 DIAGNOSIS — N2581 Secondary hyperparathyroidism of renal origin: Secondary | ICD-10-CM | POA: Diagnosis not present

## 2017-04-06 DIAGNOSIS — D509 Iron deficiency anemia, unspecified: Secondary | ICD-10-CM | POA: Diagnosis not present

## 2017-04-06 DIAGNOSIS — D631 Anemia in chronic kidney disease: Secondary | ICD-10-CM | POA: Diagnosis not present

## 2017-04-06 DIAGNOSIS — N186 End stage renal disease: Secondary | ICD-10-CM | POA: Diagnosis not present

## 2017-04-09 DIAGNOSIS — E875 Hyperkalemia: Secondary | ICD-10-CM | POA: Diagnosis not present

## 2017-04-09 DIAGNOSIS — D509 Iron deficiency anemia, unspecified: Secondary | ICD-10-CM | POA: Diagnosis not present

## 2017-04-09 DIAGNOSIS — N2581 Secondary hyperparathyroidism of renal origin: Secondary | ICD-10-CM | POA: Diagnosis not present

## 2017-04-09 DIAGNOSIS — N186 End stage renal disease: Secondary | ICD-10-CM | POA: Diagnosis not present

## 2017-04-09 DIAGNOSIS — D631 Anemia in chronic kidney disease: Secondary | ICD-10-CM | POA: Diagnosis not present

## 2017-04-11 DIAGNOSIS — D509 Iron deficiency anemia, unspecified: Secondary | ICD-10-CM | POA: Diagnosis not present

## 2017-04-11 DIAGNOSIS — N186 End stage renal disease: Secondary | ICD-10-CM | POA: Diagnosis not present

## 2017-04-11 DIAGNOSIS — E875 Hyperkalemia: Secondary | ICD-10-CM | POA: Diagnosis not present

## 2017-04-11 DIAGNOSIS — N2581 Secondary hyperparathyroidism of renal origin: Secondary | ICD-10-CM | POA: Diagnosis not present

## 2017-04-11 DIAGNOSIS — D631 Anemia in chronic kidney disease: Secondary | ICD-10-CM | POA: Diagnosis not present

## 2017-04-12 DIAGNOSIS — N186 End stage renal disease: Secondary | ICD-10-CM | POA: Diagnosis not present

## 2017-04-12 DIAGNOSIS — Z992 Dependence on renal dialysis: Secondary | ICD-10-CM | POA: Diagnosis not present

## 2017-04-13 DIAGNOSIS — D631 Anemia in chronic kidney disease: Secondary | ICD-10-CM | POA: Diagnosis not present

## 2017-04-13 DIAGNOSIS — D509 Iron deficiency anemia, unspecified: Secondary | ICD-10-CM | POA: Diagnosis not present

## 2017-04-13 DIAGNOSIS — N186 End stage renal disease: Secondary | ICD-10-CM | POA: Diagnosis not present

## 2017-04-13 DIAGNOSIS — N2581 Secondary hyperparathyroidism of renal origin: Secondary | ICD-10-CM | POA: Diagnosis not present

## 2017-04-17 DIAGNOSIS — D631 Anemia in chronic kidney disease: Secondary | ICD-10-CM | POA: Diagnosis not present

## 2017-04-17 DIAGNOSIS — D509 Iron deficiency anemia, unspecified: Secondary | ICD-10-CM | POA: Diagnosis not present

## 2017-04-17 DIAGNOSIS — N186 End stage renal disease: Secondary | ICD-10-CM | POA: Diagnosis not present

## 2017-04-17 DIAGNOSIS — N2581 Secondary hyperparathyroidism of renal origin: Secondary | ICD-10-CM | POA: Diagnosis not present

## 2017-04-20 DIAGNOSIS — D509 Iron deficiency anemia, unspecified: Secondary | ICD-10-CM | POA: Diagnosis not present

## 2017-04-20 DIAGNOSIS — N186 End stage renal disease: Secondary | ICD-10-CM | POA: Diagnosis not present

## 2017-04-20 DIAGNOSIS — N2581 Secondary hyperparathyroidism of renal origin: Secondary | ICD-10-CM | POA: Diagnosis not present

## 2017-04-20 DIAGNOSIS — D631 Anemia in chronic kidney disease: Secondary | ICD-10-CM | POA: Diagnosis not present

## 2017-04-22 DIAGNOSIS — N186 End stage renal disease: Secondary | ICD-10-CM | POA: Diagnosis not present

## 2017-04-22 DIAGNOSIS — Z992 Dependence on renal dialysis: Secondary | ICD-10-CM | POA: Diagnosis not present

## 2017-04-22 DIAGNOSIS — G8929 Other chronic pain: Secondary | ICD-10-CM | POA: Diagnosis not present

## 2017-04-22 DIAGNOSIS — E1021 Type 1 diabetes mellitus with diabetic nephropathy: Secondary | ICD-10-CM | POA: Diagnosis not present

## 2017-04-22 DIAGNOSIS — G4733 Obstructive sleep apnea (adult) (pediatric): Secondary | ICD-10-CM | POA: Diagnosis not present

## 2017-04-23 DIAGNOSIS — N186 End stage renal disease: Secondary | ICD-10-CM | POA: Diagnosis not present

## 2017-04-23 DIAGNOSIS — N2581 Secondary hyperparathyroidism of renal origin: Secondary | ICD-10-CM | POA: Diagnosis not present

## 2017-04-23 DIAGNOSIS — D509 Iron deficiency anemia, unspecified: Secondary | ICD-10-CM | POA: Diagnosis not present

## 2017-04-23 DIAGNOSIS — D631 Anemia in chronic kidney disease: Secondary | ICD-10-CM | POA: Diagnosis not present

## 2017-04-27 DIAGNOSIS — D631 Anemia in chronic kidney disease: Secondary | ICD-10-CM | POA: Diagnosis not present

## 2017-04-27 DIAGNOSIS — N2581 Secondary hyperparathyroidism of renal origin: Secondary | ICD-10-CM | POA: Diagnosis not present

## 2017-04-27 DIAGNOSIS — N186 End stage renal disease: Secondary | ICD-10-CM | POA: Diagnosis not present

## 2017-04-27 DIAGNOSIS — D509 Iron deficiency anemia, unspecified: Secondary | ICD-10-CM | POA: Diagnosis not present

## 2017-04-30 DIAGNOSIS — D631 Anemia in chronic kidney disease: Secondary | ICD-10-CM | POA: Diagnosis not present

## 2017-04-30 DIAGNOSIS — D509 Iron deficiency anemia, unspecified: Secondary | ICD-10-CM | POA: Diagnosis not present

## 2017-04-30 DIAGNOSIS — N2581 Secondary hyperparathyroidism of renal origin: Secondary | ICD-10-CM | POA: Diagnosis not present

## 2017-04-30 DIAGNOSIS — N186 End stage renal disease: Secondary | ICD-10-CM | POA: Diagnosis not present

## 2017-05-02 DIAGNOSIS — D509 Iron deficiency anemia, unspecified: Secondary | ICD-10-CM | POA: Diagnosis not present

## 2017-05-02 DIAGNOSIS — N186 End stage renal disease: Secondary | ICD-10-CM | POA: Diagnosis not present

## 2017-05-02 DIAGNOSIS — N2581 Secondary hyperparathyroidism of renal origin: Secondary | ICD-10-CM | POA: Diagnosis not present

## 2017-05-02 DIAGNOSIS — D631 Anemia in chronic kidney disease: Secondary | ICD-10-CM | POA: Diagnosis not present

## 2017-05-04 DIAGNOSIS — D509 Iron deficiency anemia, unspecified: Secondary | ICD-10-CM | POA: Diagnosis not present

## 2017-05-04 DIAGNOSIS — N186 End stage renal disease: Secondary | ICD-10-CM | POA: Diagnosis not present

## 2017-05-04 DIAGNOSIS — D631 Anemia in chronic kidney disease: Secondary | ICD-10-CM | POA: Diagnosis not present

## 2017-05-04 DIAGNOSIS — N2581 Secondary hyperparathyroidism of renal origin: Secondary | ICD-10-CM | POA: Diagnosis not present

## 2017-05-07 DIAGNOSIS — N186 End stage renal disease: Secondary | ICD-10-CM | POA: Diagnosis not present

## 2017-05-07 DIAGNOSIS — D509 Iron deficiency anemia, unspecified: Secondary | ICD-10-CM | POA: Diagnosis not present

## 2017-05-07 DIAGNOSIS — D631 Anemia in chronic kidney disease: Secondary | ICD-10-CM | POA: Diagnosis not present

## 2017-05-07 DIAGNOSIS — N2581 Secondary hyperparathyroidism of renal origin: Secondary | ICD-10-CM | POA: Diagnosis not present

## 2017-05-09 DIAGNOSIS — N2581 Secondary hyperparathyroidism of renal origin: Secondary | ICD-10-CM | POA: Diagnosis not present

## 2017-05-09 DIAGNOSIS — N186 End stage renal disease: Secondary | ICD-10-CM | POA: Diagnosis not present

## 2017-05-09 DIAGNOSIS — D631 Anemia in chronic kidney disease: Secondary | ICD-10-CM | POA: Diagnosis not present

## 2017-05-09 DIAGNOSIS — D509 Iron deficiency anemia, unspecified: Secondary | ICD-10-CM | POA: Diagnosis not present

## 2017-05-11 DIAGNOSIS — N186 End stage renal disease: Secondary | ICD-10-CM | POA: Diagnosis not present

## 2017-05-11 DIAGNOSIS — D509 Iron deficiency anemia, unspecified: Secondary | ICD-10-CM | POA: Diagnosis not present

## 2017-05-11 DIAGNOSIS — N2581 Secondary hyperparathyroidism of renal origin: Secondary | ICD-10-CM | POA: Diagnosis not present

## 2017-05-11 DIAGNOSIS — D631 Anemia in chronic kidney disease: Secondary | ICD-10-CM | POA: Diagnosis not present

## 2017-05-12 DIAGNOSIS — Z992 Dependence on renal dialysis: Secondary | ICD-10-CM | POA: Diagnosis not present

## 2017-05-12 DIAGNOSIS — N186 End stage renal disease: Secondary | ICD-10-CM | POA: Diagnosis not present

## 2017-05-16 DIAGNOSIS — Z992 Dependence on renal dialysis: Secondary | ICD-10-CM | POA: Insufficient documentation

## 2017-05-16 DIAGNOSIS — N2581 Secondary hyperparathyroidism of renal origin: Secondary | ICD-10-CM | POA: Diagnosis not present

## 2017-05-16 DIAGNOSIS — Z1159 Encounter for screening for other viral diseases: Secondary | ICD-10-CM | POA: Diagnosis not present

## 2017-05-16 DIAGNOSIS — I10 Essential (primary) hypertension: Secondary | ICD-10-CM | POA: Insufficient documentation

## 2017-05-16 DIAGNOSIS — D509 Iron deficiency anemia, unspecified: Secondary | ICD-10-CM | POA: Diagnosis not present

## 2017-05-16 DIAGNOSIS — E889 Metabolic disorder, unspecified: Secondary | ICD-10-CM | POA: Insufficient documentation

## 2017-05-16 DIAGNOSIS — Z23 Encounter for immunization: Secondary | ICD-10-CM | POA: Diagnosis not present

## 2017-05-16 DIAGNOSIS — N186 End stage renal disease: Secondary | ICD-10-CM | POA: Diagnosis not present

## 2017-05-16 DIAGNOSIS — D631 Anemia in chronic kidney disease: Secondary | ICD-10-CM | POA: Insufficient documentation

## 2017-05-16 DIAGNOSIS — N185 Chronic kidney disease, stage 5: Secondary | ICD-10-CM

## 2017-05-16 DIAGNOSIS — M9080 Osteopathy in diseases classified elsewhere, unspecified site: Secondary | ICD-10-CM | POA: Insufficient documentation

## 2017-05-17 DIAGNOSIS — K611 Rectal abscess: Secondary | ICD-10-CM | POA: Insufficient documentation

## 2017-05-17 DIAGNOSIS — T859XXA Unspecified complication of internal prosthetic device, implant and graft, initial encounter: Secondary | ICD-10-CM | POA: Insufficient documentation

## 2017-05-18 DIAGNOSIS — N2581 Secondary hyperparathyroidism of renal origin: Secondary | ICD-10-CM | POA: Diagnosis not present

## 2017-05-18 DIAGNOSIS — Z992 Dependence on renal dialysis: Secondary | ICD-10-CM | POA: Diagnosis not present

## 2017-05-18 DIAGNOSIS — D509 Iron deficiency anemia, unspecified: Secondary | ICD-10-CM | POA: Diagnosis not present

## 2017-05-18 DIAGNOSIS — Z23 Encounter for immunization: Secondary | ICD-10-CM | POA: Diagnosis not present

## 2017-05-18 DIAGNOSIS — N186 End stage renal disease: Secondary | ICD-10-CM | POA: Diagnosis not present

## 2017-05-19 DIAGNOSIS — M545 Low back pain: Secondary | ICD-10-CM | POA: Diagnosis not present

## 2017-05-19 DIAGNOSIS — Z87891 Personal history of nicotine dependence: Secondary | ICD-10-CM | POA: Diagnosis not present

## 2017-05-19 DIAGNOSIS — M546 Pain in thoracic spine: Secondary | ICD-10-CM | POA: Diagnosis not present

## 2017-05-19 DIAGNOSIS — G8929 Other chronic pain: Secondary | ICD-10-CM | POA: Diagnosis not present

## 2017-05-21 DIAGNOSIS — Z23 Encounter for immunization: Secondary | ICD-10-CM | POA: Diagnosis not present

## 2017-05-21 DIAGNOSIS — N2581 Secondary hyperparathyroidism of renal origin: Secondary | ICD-10-CM | POA: Diagnosis not present

## 2017-05-21 DIAGNOSIS — Z992 Dependence on renal dialysis: Secondary | ICD-10-CM | POA: Diagnosis not present

## 2017-05-21 DIAGNOSIS — D509 Iron deficiency anemia, unspecified: Secondary | ICD-10-CM | POA: Diagnosis not present

## 2017-05-21 DIAGNOSIS — N186 End stage renal disease: Secondary | ICD-10-CM | POA: Diagnosis not present

## 2017-05-25 DIAGNOSIS — D509 Iron deficiency anemia, unspecified: Secondary | ICD-10-CM | POA: Diagnosis not present

## 2017-05-25 DIAGNOSIS — N186 End stage renal disease: Secondary | ICD-10-CM | POA: Diagnosis not present

## 2017-05-25 DIAGNOSIS — N2581 Secondary hyperparathyroidism of renal origin: Secondary | ICD-10-CM | POA: Diagnosis not present

## 2017-05-25 DIAGNOSIS — Z23 Encounter for immunization: Secondary | ICD-10-CM | POA: Diagnosis not present

## 2017-05-25 DIAGNOSIS — Z992 Dependence on renal dialysis: Secondary | ICD-10-CM | POA: Diagnosis not present

## 2017-05-28 DIAGNOSIS — Z23 Encounter for immunization: Secondary | ICD-10-CM | POA: Diagnosis not present

## 2017-05-28 DIAGNOSIS — N2581 Secondary hyperparathyroidism of renal origin: Secondary | ICD-10-CM | POA: Diagnosis not present

## 2017-05-28 DIAGNOSIS — N186 End stage renal disease: Secondary | ICD-10-CM | POA: Diagnosis not present

## 2017-05-28 DIAGNOSIS — D509 Iron deficiency anemia, unspecified: Secondary | ICD-10-CM | POA: Diagnosis not present

## 2017-05-28 DIAGNOSIS — Z992 Dependence on renal dialysis: Secondary | ICD-10-CM | POA: Diagnosis not present

## 2017-05-30 DIAGNOSIS — Z23 Encounter for immunization: Secondary | ICD-10-CM | POA: Diagnosis not present

## 2017-05-30 DIAGNOSIS — N2581 Secondary hyperparathyroidism of renal origin: Secondary | ICD-10-CM | POA: Diagnosis not present

## 2017-05-30 DIAGNOSIS — Z992 Dependence on renal dialysis: Secondary | ICD-10-CM | POA: Diagnosis not present

## 2017-05-30 DIAGNOSIS — N186 End stage renal disease: Secondary | ICD-10-CM | POA: Diagnosis not present

## 2017-05-30 DIAGNOSIS — D509 Iron deficiency anemia, unspecified: Secondary | ICD-10-CM | POA: Diagnosis not present

## 2017-06-01 DIAGNOSIS — N186 End stage renal disease: Secondary | ICD-10-CM | POA: Diagnosis not present

## 2017-06-01 DIAGNOSIS — Z992 Dependence on renal dialysis: Secondary | ICD-10-CM | POA: Diagnosis not present

## 2017-06-01 DIAGNOSIS — Z23 Encounter for immunization: Secondary | ICD-10-CM | POA: Diagnosis not present

## 2017-06-01 DIAGNOSIS — N2581 Secondary hyperparathyroidism of renal origin: Secondary | ICD-10-CM | POA: Diagnosis not present

## 2017-06-01 DIAGNOSIS — D509 Iron deficiency anemia, unspecified: Secondary | ICD-10-CM | POA: Diagnosis not present

## 2017-06-04 DIAGNOSIS — N186 End stage renal disease: Secondary | ICD-10-CM | POA: Diagnosis not present

## 2017-06-04 DIAGNOSIS — N2581 Secondary hyperparathyroidism of renal origin: Secondary | ICD-10-CM | POA: Diagnosis not present

## 2017-06-04 DIAGNOSIS — Z23 Encounter for immunization: Secondary | ICD-10-CM | POA: Diagnosis not present

## 2017-06-04 DIAGNOSIS — D509 Iron deficiency anemia, unspecified: Secondary | ICD-10-CM | POA: Diagnosis not present

## 2017-06-04 DIAGNOSIS — Z992 Dependence on renal dialysis: Secondary | ICD-10-CM | POA: Diagnosis not present

## 2017-06-06 DIAGNOSIS — Z23 Encounter for immunization: Secondary | ICD-10-CM | POA: Diagnosis not present

## 2017-06-06 DIAGNOSIS — N2581 Secondary hyperparathyroidism of renal origin: Secondary | ICD-10-CM | POA: Diagnosis not present

## 2017-06-06 DIAGNOSIS — Z992 Dependence on renal dialysis: Secondary | ICD-10-CM | POA: Diagnosis not present

## 2017-06-06 DIAGNOSIS — D509 Iron deficiency anemia, unspecified: Secondary | ICD-10-CM | POA: Diagnosis not present

## 2017-06-06 DIAGNOSIS — N186 End stage renal disease: Secondary | ICD-10-CM | POA: Diagnosis not present

## 2017-06-11 DIAGNOSIS — N186 End stage renal disease: Secondary | ICD-10-CM | POA: Diagnosis not present

## 2017-06-11 DIAGNOSIS — N2581 Secondary hyperparathyroidism of renal origin: Secondary | ICD-10-CM | POA: Diagnosis not present

## 2017-06-11 DIAGNOSIS — Z992 Dependence on renal dialysis: Secondary | ICD-10-CM | POA: Diagnosis not present

## 2017-06-11 DIAGNOSIS — Z23 Encounter for immunization: Secondary | ICD-10-CM | POA: Diagnosis not present

## 2017-06-11 DIAGNOSIS — D509 Iron deficiency anemia, unspecified: Secondary | ICD-10-CM | POA: Diagnosis not present

## 2017-06-15 DIAGNOSIS — Z992 Dependence on renal dialysis: Secondary | ICD-10-CM | POA: Diagnosis not present

## 2017-06-15 DIAGNOSIS — N186 End stage renal disease: Secondary | ICD-10-CM | POA: Diagnosis not present

## 2017-06-15 DIAGNOSIS — D509 Iron deficiency anemia, unspecified: Secondary | ICD-10-CM | POA: Diagnosis not present

## 2017-06-15 DIAGNOSIS — N2581 Secondary hyperparathyroidism of renal origin: Secondary | ICD-10-CM | POA: Diagnosis not present

## 2017-06-18 DIAGNOSIS — N186 End stage renal disease: Secondary | ICD-10-CM | POA: Diagnosis not present

## 2017-06-18 DIAGNOSIS — Z992 Dependence on renal dialysis: Secondary | ICD-10-CM | POA: Diagnosis not present

## 2017-06-18 DIAGNOSIS — D509 Iron deficiency anemia, unspecified: Secondary | ICD-10-CM | POA: Diagnosis not present

## 2017-06-18 DIAGNOSIS — N2581 Secondary hyperparathyroidism of renal origin: Secondary | ICD-10-CM | POA: Diagnosis not present

## 2017-06-20 DIAGNOSIS — N186 End stage renal disease: Secondary | ICD-10-CM | POA: Diagnosis not present

## 2017-06-20 DIAGNOSIS — N2581 Secondary hyperparathyroidism of renal origin: Secondary | ICD-10-CM | POA: Diagnosis not present

## 2017-06-20 DIAGNOSIS — D509 Iron deficiency anemia, unspecified: Secondary | ICD-10-CM | POA: Diagnosis not present

## 2017-06-20 DIAGNOSIS — Z992 Dependence on renal dialysis: Secondary | ICD-10-CM | POA: Diagnosis not present

## 2017-06-25 DIAGNOSIS — D509 Iron deficiency anemia, unspecified: Secondary | ICD-10-CM | POA: Diagnosis not present

## 2017-06-25 DIAGNOSIS — N186 End stage renal disease: Secondary | ICD-10-CM | POA: Diagnosis not present

## 2017-06-25 DIAGNOSIS — N2581 Secondary hyperparathyroidism of renal origin: Secondary | ICD-10-CM | POA: Diagnosis not present

## 2017-06-25 DIAGNOSIS — Z992 Dependence on renal dialysis: Secondary | ICD-10-CM | POA: Diagnosis not present

## 2017-06-27 DIAGNOSIS — N186 End stage renal disease: Secondary | ICD-10-CM | POA: Diagnosis not present

## 2017-06-27 DIAGNOSIS — Z992 Dependence on renal dialysis: Secondary | ICD-10-CM | POA: Diagnosis not present

## 2017-06-27 DIAGNOSIS — D509 Iron deficiency anemia, unspecified: Secondary | ICD-10-CM | POA: Diagnosis not present

## 2017-06-27 DIAGNOSIS — N2581 Secondary hyperparathyroidism of renal origin: Secondary | ICD-10-CM | POA: Diagnosis not present

## 2017-06-29 DIAGNOSIS — N2581 Secondary hyperparathyroidism of renal origin: Secondary | ICD-10-CM | POA: Diagnosis not present

## 2017-06-29 DIAGNOSIS — D509 Iron deficiency anemia, unspecified: Secondary | ICD-10-CM | POA: Diagnosis not present

## 2017-06-29 DIAGNOSIS — Z992 Dependence on renal dialysis: Secondary | ICD-10-CM | POA: Diagnosis not present

## 2017-06-29 DIAGNOSIS — N186 End stage renal disease: Secondary | ICD-10-CM | POA: Diagnosis not present

## 2017-07-02 DIAGNOSIS — N2581 Secondary hyperparathyroidism of renal origin: Secondary | ICD-10-CM | POA: Diagnosis not present

## 2017-07-02 DIAGNOSIS — Z992 Dependence on renal dialysis: Secondary | ICD-10-CM | POA: Diagnosis not present

## 2017-07-02 DIAGNOSIS — D509 Iron deficiency anemia, unspecified: Secondary | ICD-10-CM | POA: Diagnosis not present

## 2017-07-02 DIAGNOSIS — N186 End stage renal disease: Secondary | ICD-10-CM | POA: Diagnosis not present

## 2017-07-05 DIAGNOSIS — N2581 Secondary hyperparathyroidism of renal origin: Secondary | ICD-10-CM | POA: Diagnosis not present

## 2017-07-05 DIAGNOSIS — D509 Iron deficiency anemia, unspecified: Secondary | ICD-10-CM | POA: Diagnosis not present

## 2017-07-05 DIAGNOSIS — N186 End stage renal disease: Secondary | ICD-10-CM | POA: Diagnosis not present

## 2017-07-05 DIAGNOSIS — Z992 Dependence on renal dialysis: Secondary | ICD-10-CM | POA: Diagnosis not present

## 2017-07-06 DIAGNOSIS — D509 Iron deficiency anemia, unspecified: Secondary | ICD-10-CM | POA: Diagnosis not present

## 2017-07-06 DIAGNOSIS — N2581 Secondary hyperparathyroidism of renal origin: Secondary | ICD-10-CM | POA: Diagnosis not present

## 2017-07-06 DIAGNOSIS — N186 End stage renal disease: Secondary | ICD-10-CM | POA: Diagnosis not present

## 2017-07-06 DIAGNOSIS — Z992 Dependence on renal dialysis: Secondary | ICD-10-CM | POA: Diagnosis not present

## 2017-07-09 DIAGNOSIS — N2581 Secondary hyperparathyroidism of renal origin: Secondary | ICD-10-CM | POA: Diagnosis not present

## 2017-07-09 DIAGNOSIS — N186 End stage renal disease: Secondary | ICD-10-CM | POA: Diagnosis not present

## 2017-07-09 DIAGNOSIS — D509 Iron deficiency anemia, unspecified: Secondary | ICD-10-CM | POA: Diagnosis not present

## 2017-07-09 DIAGNOSIS — Z992 Dependence on renal dialysis: Secondary | ICD-10-CM | POA: Diagnosis not present

## 2017-07-11 DIAGNOSIS — N186 End stage renal disease: Secondary | ICD-10-CM | POA: Diagnosis not present

## 2017-07-11 DIAGNOSIS — N2581 Secondary hyperparathyroidism of renal origin: Secondary | ICD-10-CM | POA: Diagnosis not present

## 2017-07-11 DIAGNOSIS — Z992 Dependence on renal dialysis: Secondary | ICD-10-CM | POA: Diagnosis not present

## 2017-07-11 DIAGNOSIS — D509 Iron deficiency anemia, unspecified: Secondary | ICD-10-CM | POA: Diagnosis not present

## 2017-07-12 DIAGNOSIS — N186 End stage renal disease: Secondary | ICD-10-CM | POA: Diagnosis not present

## 2017-07-12 DIAGNOSIS — Z992 Dependence on renal dialysis: Secondary | ICD-10-CM | POA: Diagnosis not present

## 2017-07-13 DIAGNOSIS — N2581 Secondary hyperparathyroidism of renal origin: Secondary | ICD-10-CM | POA: Diagnosis not present

## 2017-07-13 DIAGNOSIS — N186 End stage renal disease: Secondary | ICD-10-CM | POA: Diagnosis not present

## 2017-07-13 DIAGNOSIS — Z992 Dependence on renal dialysis: Secondary | ICD-10-CM | POA: Diagnosis not present

## 2017-07-13 DIAGNOSIS — D509 Iron deficiency anemia, unspecified: Secondary | ICD-10-CM | POA: Diagnosis not present

## 2017-07-16 DIAGNOSIS — N186 End stage renal disease: Secondary | ICD-10-CM | POA: Diagnosis not present

## 2017-07-16 DIAGNOSIS — N2581 Secondary hyperparathyroidism of renal origin: Secondary | ICD-10-CM | POA: Diagnosis not present

## 2017-07-16 DIAGNOSIS — Z992 Dependence on renal dialysis: Secondary | ICD-10-CM | POA: Diagnosis not present

## 2017-07-16 DIAGNOSIS — D509 Iron deficiency anemia, unspecified: Secondary | ICD-10-CM | POA: Diagnosis not present

## 2017-07-17 DIAGNOSIS — D509 Iron deficiency anemia, unspecified: Secondary | ICD-10-CM | POA: Diagnosis not present

## 2017-07-17 DIAGNOSIS — Z992 Dependence on renal dialysis: Secondary | ICD-10-CM | POA: Diagnosis not present

## 2017-07-17 DIAGNOSIS — N186 End stage renal disease: Secondary | ICD-10-CM | POA: Diagnosis not present

## 2017-07-17 DIAGNOSIS — N2581 Secondary hyperparathyroidism of renal origin: Secondary | ICD-10-CM | POA: Diagnosis not present

## 2017-07-18 DIAGNOSIS — D509 Iron deficiency anemia, unspecified: Secondary | ICD-10-CM | POA: Diagnosis not present

## 2017-07-18 DIAGNOSIS — N2581 Secondary hyperparathyroidism of renal origin: Secondary | ICD-10-CM | POA: Diagnosis not present

## 2017-07-18 DIAGNOSIS — N186 End stage renal disease: Secondary | ICD-10-CM | POA: Diagnosis not present

## 2017-07-18 DIAGNOSIS — Z992 Dependence on renal dialysis: Secondary | ICD-10-CM | POA: Diagnosis not present

## 2017-07-23 DIAGNOSIS — N186 End stage renal disease: Secondary | ICD-10-CM | POA: Diagnosis not present

## 2017-07-23 DIAGNOSIS — D509 Iron deficiency anemia, unspecified: Secondary | ICD-10-CM | POA: Diagnosis not present

## 2017-07-23 DIAGNOSIS — Z992 Dependence on renal dialysis: Secondary | ICD-10-CM | POA: Diagnosis not present

## 2017-07-23 DIAGNOSIS — N2581 Secondary hyperparathyroidism of renal origin: Secondary | ICD-10-CM | POA: Diagnosis not present

## 2017-07-25 DIAGNOSIS — N186 End stage renal disease: Secondary | ICD-10-CM | POA: Diagnosis not present

## 2017-07-25 DIAGNOSIS — Z992 Dependence on renal dialysis: Secondary | ICD-10-CM | POA: Diagnosis not present

## 2017-07-25 DIAGNOSIS — N2581 Secondary hyperparathyroidism of renal origin: Secondary | ICD-10-CM | POA: Diagnosis not present

## 2017-07-25 DIAGNOSIS — D509 Iron deficiency anemia, unspecified: Secondary | ICD-10-CM | POA: Diagnosis not present

## 2017-07-27 DIAGNOSIS — Z992 Dependence on renal dialysis: Secondary | ICD-10-CM | POA: Diagnosis not present

## 2017-07-27 DIAGNOSIS — N2581 Secondary hyperparathyroidism of renal origin: Secondary | ICD-10-CM | POA: Diagnosis not present

## 2017-07-27 DIAGNOSIS — D509 Iron deficiency anemia, unspecified: Secondary | ICD-10-CM | POA: Diagnosis not present

## 2017-07-27 DIAGNOSIS — N186 End stage renal disease: Secondary | ICD-10-CM | POA: Diagnosis not present

## 2017-07-30 DIAGNOSIS — N2581 Secondary hyperparathyroidism of renal origin: Secondary | ICD-10-CM | POA: Diagnosis not present

## 2017-07-30 DIAGNOSIS — Z992 Dependence on renal dialysis: Secondary | ICD-10-CM | POA: Diagnosis not present

## 2017-07-30 DIAGNOSIS — N186 End stage renal disease: Secondary | ICD-10-CM | POA: Diagnosis not present

## 2017-07-30 DIAGNOSIS — D509 Iron deficiency anemia, unspecified: Secondary | ICD-10-CM | POA: Diagnosis not present

## 2017-08-01 DIAGNOSIS — N2581 Secondary hyperparathyroidism of renal origin: Secondary | ICD-10-CM | POA: Diagnosis not present

## 2017-08-01 DIAGNOSIS — N186 End stage renal disease: Secondary | ICD-10-CM | POA: Diagnosis not present

## 2017-08-01 DIAGNOSIS — D509 Iron deficiency anemia, unspecified: Secondary | ICD-10-CM | POA: Diagnosis not present

## 2017-08-01 DIAGNOSIS — Z992 Dependence on renal dialysis: Secondary | ICD-10-CM | POA: Diagnosis not present

## 2017-08-03 DIAGNOSIS — N186 End stage renal disease: Secondary | ICD-10-CM | POA: Diagnosis not present

## 2017-08-03 DIAGNOSIS — D509 Iron deficiency anemia, unspecified: Secondary | ICD-10-CM | POA: Diagnosis not present

## 2017-08-03 DIAGNOSIS — Z992 Dependence on renal dialysis: Secondary | ICD-10-CM | POA: Diagnosis not present

## 2017-08-03 DIAGNOSIS — N2581 Secondary hyperparathyroidism of renal origin: Secondary | ICD-10-CM | POA: Diagnosis not present

## 2017-08-05 DIAGNOSIS — N186 End stage renal disease: Secondary | ICD-10-CM | POA: Diagnosis not present

## 2017-08-05 DIAGNOSIS — Z992 Dependence on renal dialysis: Secondary | ICD-10-CM | POA: Diagnosis not present

## 2017-08-05 DIAGNOSIS — D509 Iron deficiency anemia, unspecified: Secondary | ICD-10-CM | POA: Diagnosis not present

## 2017-08-05 DIAGNOSIS — N2581 Secondary hyperparathyroidism of renal origin: Secondary | ICD-10-CM | POA: Diagnosis not present

## 2017-08-07 DIAGNOSIS — N2581 Secondary hyperparathyroidism of renal origin: Secondary | ICD-10-CM | POA: Diagnosis not present

## 2017-08-07 DIAGNOSIS — D509 Iron deficiency anemia, unspecified: Secondary | ICD-10-CM | POA: Diagnosis not present

## 2017-08-07 DIAGNOSIS — Z992 Dependence on renal dialysis: Secondary | ICD-10-CM | POA: Diagnosis not present

## 2017-08-07 DIAGNOSIS — N186 End stage renal disease: Secondary | ICD-10-CM | POA: Diagnosis not present

## 2017-08-11 DIAGNOSIS — N2581 Secondary hyperparathyroidism of renal origin: Secondary | ICD-10-CM | POA: Diagnosis not present

## 2017-08-11 DIAGNOSIS — N186 End stage renal disease: Secondary | ICD-10-CM | POA: Diagnosis not present

## 2017-08-11 DIAGNOSIS — D509 Iron deficiency anemia, unspecified: Secondary | ICD-10-CM | POA: Diagnosis not present

## 2017-08-11 DIAGNOSIS — Z992 Dependence on renal dialysis: Secondary | ICD-10-CM | POA: Diagnosis not present

## 2017-08-12 DIAGNOSIS — Z992 Dependence on renal dialysis: Secondary | ICD-10-CM | POA: Diagnosis not present

## 2017-08-12 DIAGNOSIS — N186 End stage renal disease: Secondary | ICD-10-CM | POA: Diagnosis not present

## 2017-08-13 DIAGNOSIS — D631 Anemia in chronic kidney disease: Secondary | ICD-10-CM | POA: Diagnosis not present

## 2017-08-13 DIAGNOSIS — N2581 Secondary hyperparathyroidism of renal origin: Secondary | ICD-10-CM | POA: Diagnosis not present

## 2017-08-13 DIAGNOSIS — D509 Iron deficiency anemia, unspecified: Secondary | ICD-10-CM | POA: Diagnosis not present

## 2017-08-13 DIAGNOSIS — N186 End stage renal disease: Secondary | ICD-10-CM | POA: Diagnosis not present

## 2017-08-13 DIAGNOSIS — Z992 Dependence on renal dialysis: Secondary | ICD-10-CM | POA: Diagnosis not present

## 2017-08-14 DIAGNOSIS — D631 Anemia in chronic kidney disease: Secondary | ICD-10-CM | POA: Diagnosis not present

## 2017-08-14 DIAGNOSIS — D509 Iron deficiency anemia, unspecified: Secondary | ICD-10-CM | POA: Diagnosis not present

## 2017-08-14 DIAGNOSIS — Z992 Dependence on renal dialysis: Secondary | ICD-10-CM | POA: Diagnosis not present

## 2017-08-14 DIAGNOSIS — N186 End stage renal disease: Secondary | ICD-10-CM | POA: Diagnosis not present

## 2017-08-14 DIAGNOSIS — N2581 Secondary hyperparathyroidism of renal origin: Secondary | ICD-10-CM | POA: Diagnosis not present

## 2017-08-16 DIAGNOSIS — N2581 Secondary hyperparathyroidism of renal origin: Secondary | ICD-10-CM | POA: Diagnosis not present

## 2017-08-16 DIAGNOSIS — N186 End stage renal disease: Secondary | ICD-10-CM | POA: Diagnosis not present

## 2017-08-16 DIAGNOSIS — D631 Anemia in chronic kidney disease: Secondary | ICD-10-CM | POA: Diagnosis not present

## 2017-08-16 DIAGNOSIS — D509 Iron deficiency anemia, unspecified: Secondary | ICD-10-CM | POA: Diagnosis not present

## 2017-08-16 DIAGNOSIS — Z992 Dependence on renal dialysis: Secondary | ICD-10-CM | POA: Diagnosis not present

## 2017-08-19 DIAGNOSIS — N2581 Secondary hyperparathyroidism of renal origin: Secondary | ICD-10-CM | POA: Diagnosis not present

## 2017-08-19 DIAGNOSIS — D631 Anemia in chronic kidney disease: Secondary | ICD-10-CM | POA: Diagnosis not present

## 2017-08-19 DIAGNOSIS — Z992 Dependence on renal dialysis: Secondary | ICD-10-CM | POA: Diagnosis not present

## 2017-08-19 DIAGNOSIS — D509 Iron deficiency anemia, unspecified: Secondary | ICD-10-CM | POA: Diagnosis not present

## 2017-08-19 DIAGNOSIS — N186 End stage renal disease: Secondary | ICD-10-CM | POA: Diagnosis not present

## 2017-08-21 DIAGNOSIS — D509 Iron deficiency anemia, unspecified: Secondary | ICD-10-CM | POA: Diagnosis not present

## 2017-08-21 DIAGNOSIS — N186 End stage renal disease: Secondary | ICD-10-CM | POA: Diagnosis not present

## 2017-08-21 DIAGNOSIS — N2581 Secondary hyperparathyroidism of renal origin: Secondary | ICD-10-CM | POA: Diagnosis not present

## 2017-08-21 DIAGNOSIS — Z992 Dependence on renal dialysis: Secondary | ICD-10-CM | POA: Diagnosis not present

## 2017-08-21 DIAGNOSIS — D631 Anemia in chronic kidney disease: Secondary | ICD-10-CM | POA: Diagnosis not present

## 2017-08-23 DIAGNOSIS — D631 Anemia in chronic kidney disease: Secondary | ICD-10-CM | POA: Diagnosis not present

## 2017-08-23 DIAGNOSIS — D509 Iron deficiency anemia, unspecified: Secondary | ICD-10-CM | POA: Diagnosis not present

## 2017-08-23 DIAGNOSIS — N186 End stage renal disease: Secondary | ICD-10-CM | POA: Diagnosis not present

## 2017-08-23 DIAGNOSIS — N2581 Secondary hyperparathyroidism of renal origin: Secondary | ICD-10-CM | POA: Diagnosis not present

## 2017-08-23 DIAGNOSIS — Z992 Dependence on renal dialysis: Secondary | ICD-10-CM | POA: Diagnosis not present

## 2017-08-26 DIAGNOSIS — N186 End stage renal disease: Secondary | ICD-10-CM | POA: Diagnosis not present

## 2017-08-26 DIAGNOSIS — N2581 Secondary hyperparathyroidism of renal origin: Secondary | ICD-10-CM | POA: Diagnosis not present

## 2017-08-26 DIAGNOSIS — D509 Iron deficiency anemia, unspecified: Secondary | ICD-10-CM | POA: Diagnosis not present

## 2017-08-26 DIAGNOSIS — D631 Anemia in chronic kidney disease: Secondary | ICD-10-CM | POA: Diagnosis not present

## 2017-08-26 DIAGNOSIS — Z992 Dependence on renal dialysis: Secondary | ICD-10-CM | POA: Diagnosis not present

## 2017-08-28 DIAGNOSIS — D509 Iron deficiency anemia, unspecified: Secondary | ICD-10-CM | POA: Diagnosis not present

## 2017-08-28 DIAGNOSIS — Z992 Dependence on renal dialysis: Secondary | ICD-10-CM | POA: Diagnosis not present

## 2017-08-28 DIAGNOSIS — N186 End stage renal disease: Secondary | ICD-10-CM | POA: Diagnosis not present

## 2017-08-28 DIAGNOSIS — N2581 Secondary hyperparathyroidism of renal origin: Secondary | ICD-10-CM | POA: Diagnosis not present

## 2017-08-28 DIAGNOSIS — D631 Anemia in chronic kidney disease: Secondary | ICD-10-CM | POA: Diagnosis not present

## 2017-08-30 DIAGNOSIS — Z992 Dependence on renal dialysis: Secondary | ICD-10-CM | POA: Diagnosis not present

## 2017-08-30 DIAGNOSIS — N2581 Secondary hyperparathyroidism of renal origin: Secondary | ICD-10-CM | POA: Diagnosis not present

## 2017-08-30 DIAGNOSIS — N186 End stage renal disease: Secondary | ICD-10-CM | POA: Diagnosis not present

## 2017-08-30 DIAGNOSIS — D509 Iron deficiency anemia, unspecified: Secondary | ICD-10-CM | POA: Diagnosis not present

## 2017-08-30 DIAGNOSIS — D631 Anemia in chronic kidney disease: Secondary | ICD-10-CM | POA: Diagnosis not present

## 2017-09-02 DIAGNOSIS — N2581 Secondary hyperparathyroidism of renal origin: Secondary | ICD-10-CM | POA: Diagnosis not present

## 2017-09-02 DIAGNOSIS — N186 End stage renal disease: Secondary | ICD-10-CM | POA: Diagnosis not present

## 2017-09-02 DIAGNOSIS — D631 Anemia in chronic kidney disease: Secondary | ICD-10-CM | POA: Diagnosis not present

## 2017-09-02 DIAGNOSIS — D509 Iron deficiency anemia, unspecified: Secondary | ICD-10-CM | POA: Diagnosis not present

## 2017-09-02 DIAGNOSIS — Z992 Dependence on renal dialysis: Secondary | ICD-10-CM | POA: Diagnosis not present

## 2017-09-03 DIAGNOSIS — E113599 Type 2 diabetes mellitus with proliferative diabetic retinopathy without macular edema, unspecified eye: Secondary | ICD-10-CM | POA: Insufficient documentation

## 2017-09-03 DIAGNOSIS — H26229 Cataract secondary to ocular disorders (degenerative) (inflammatory), unspecified eye: Secondary | ICD-10-CM | POA: Insufficient documentation

## 2017-09-03 DIAGNOSIS — Z961 Presence of intraocular lens: Secondary | ICD-10-CM | POA: Insufficient documentation

## 2017-09-04 DIAGNOSIS — N2581 Secondary hyperparathyroidism of renal origin: Secondary | ICD-10-CM | POA: Diagnosis not present

## 2017-09-04 DIAGNOSIS — D631 Anemia in chronic kidney disease: Secondary | ICD-10-CM | POA: Diagnosis not present

## 2017-09-04 DIAGNOSIS — D509 Iron deficiency anemia, unspecified: Secondary | ICD-10-CM | POA: Diagnosis not present

## 2017-09-04 DIAGNOSIS — N186 End stage renal disease: Secondary | ICD-10-CM | POA: Diagnosis not present

## 2017-09-04 DIAGNOSIS — Z992 Dependence on renal dialysis: Secondary | ICD-10-CM | POA: Diagnosis not present

## 2017-09-06 DIAGNOSIS — N186 End stage renal disease: Secondary | ICD-10-CM | POA: Diagnosis not present

## 2017-09-06 DIAGNOSIS — D631 Anemia in chronic kidney disease: Secondary | ICD-10-CM | POA: Diagnosis not present

## 2017-09-06 DIAGNOSIS — Z992 Dependence on renal dialysis: Secondary | ICD-10-CM | POA: Diagnosis not present

## 2017-09-06 DIAGNOSIS — D509 Iron deficiency anemia, unspecified: Secondary | ICD-10-CM | POA: Diagnosis not present

## 2017-09-06 DIAGNOSIS — N2581 Secondary hyperparathyroidism of renal origin: Secondary | ICD-10-CM | POA: Diagnosis not present

## 2017-09-09 DIAGNOSIS — N186 End stage renal disease: Secondary | ICD-10-CM | POA: Diagnosis not present

## 2017-09-09 DIAGNOSIS — D509 Iron deficiency anemia, unspecified: Secondary | ICD-10-CM | POA: Diagnosis not present

## 2017-09-09 DIAGNOSIS — Z992 Dependence on renal dialysis: Secondary | ICD-10-CM | POA: Diagnosis not present

## 2017-09-09 DIAGNOSIS — N2581 Secondary hyperparathyroidism of renal origin: Secondary | ICD-10-CM | POA: Diagnosis not present

## 2017-09-09 DIAGNOSIS — D631 Anemia in chronic kidney disease: Secondary | ICD-10-CM | POA: Diagnosis not present

## 2017-09-11 DIAGNOSIS — D631 Anemia in chronic kidney disease: Secondary | ICD-10-CM | POA: Diagnosis not present

## 2017-09-11 DIAGNOSIS — Z992 Dependence on renal dialysis: Secondary | ICD-10-CM | POA: Diagnosis not present

## 2017-09-11 DIAGNOSIS — D509 Iron deficiency anemia, unspecified: Secondary | ICD-10-CM | POA: Diagnosis not present

## 2017-09-11 DIAGNOSIS — N186 End stage renal disease: Secondary | ICD-10-CM | POA: Diagnosis not present

## 2017-09-11 DIAGNOSIS — N2581 Secondary hyperparathyroidism of renal origin: Secondary | ICD-10-CM | POA: Diagnosis not present

## 2017-09-12 DIAGNOSIS — N186 End stage renal disease: Secondary | ICD-10-CM | POA: Diagnosis not present

## 2017-09-12 DIAGNOSIS — D509 Iron deficiency anemia, unspecified: Secondary | ICD-10-CM | POA: Diagnosis not present

## 2017-09-12 DIAGNOSIS — Z992 Dependence on renal dialysis: Secondary | ICD-10-CM | POA: Diagnosis not present

## 2017-09-12 DIAGNOSIS — D631 Anemia in chronic kidney disease: Secondary | ICD-10-CM | POA: Diagnosis not present

## 2017-09-12 DIAGNOSIS — N2581 Secondary hyperparathyroidism of renal origin: Secondary | ICD-10-CM | POA: Diagnosis not present

## 2017-09-13 DIAGNOSIS — N2581 Secondary hyperparathyroidism of renal origin: Secondary | ICD-10-CM | POA: Diagnosis not present

## 2017-09-13 DIAGNOSIS — D509 Iron deficiency anemia, unspecified: Secondary | ICD-10-CM | POA: Diagnosis not present

## 2017-09-13 DIAGNOSIS — Z992 Dependence on renal dialysis: Secondary | ICD-10-CM | POA: Diagnosis not present

## 2017-09-13 DIAGNOSIS — N186 End stage renal disease: Secondary | ICD-10-CM | POA: Diagnosis not present

## 2017-09-13 DIAGNOSIS — D631 Anemia in chronic kidney disease: Secondary | ICD-10-CM | POA: Diagnosis not present

## 2017-09-15 DIAGNOSIS — N186 End stage renal disease: Secondary | ICD-10-CM | POA: Diagnosis not present

## 2017-09-15 DIAGNOSIS — D631 Anemia in chronic kidney disease: Secondary | ICD-10-CM | POA: Diagnosis not present

## 2017-09-15 DIAGNOSIS — Z992 Dependence on renal dialysis: Secondary | ICD-10-CM | POA: Diagnosis not present

## 2017-09-15 DIAGNOSIS — D509 Iron deficiency anemia, unspecified: Secondary | ICD-10-CM | POA: Diagnosis not present

## 2017-09-15 DIAGNOSIS — N2581 Secondary hyperparathyroidism of renal origin: Secondary | ICD-10-CM | POA: Diagnosis not present

## 2017-09-16 DIAGNOSIS — D631 Anemia in chronic kidney disease: Secondary | ICD-10-CM | POA: Diagnosis not present

## 2017-09-16 DIAGNOSIS — D509 Iron deficiency anemia, unspecified: Secondary | ICD-10-CM | POA: Diagnosis not present

## 2017-09-16 DIAGNOSIS — N186 End stage renal disease: Secondary | ICD-10-CM | POA: Diagnosis not present

## 2017-09-16 DIAGNOSIS — Z992 Dependence on renal dialysis: Secondary | ICD-10-CM | POA: Diagnosis not present

## 2017-09-16 DIAGNOSIS — N2581 Secondary hyperparathyroidism of renal origin: Secondary | ICD-10-CM | POA: Diagnosis not present

## 2017-09-18 DIAGNOSIS — N186 End stage renal disease: Secondary | ICD-10-CM | POA: Diagnosis not present

## 2017-09-18 DIAGNOSIS — Z992 Dependence on renal dialysis: Secondary | ICD-10-CM | POA: Diagnosis not present

## 2017-09-18 DIAGNOSIS — N2581 Secondary hyperparathyroidism of renal origin: Secondary | ICD-10-CM | POA: Diagnosis not present

## 2017-09-18 DIAGNOSIS — D631 Anemia in chronic kidney disease: Secondary | ICD-10-CM | POA: Diagnosis not present

## 2017-09-18 DIAGNOSIS — D509 Iron deficiency anemia, unspecified: Secondary | ICD-10-CM | POA: Diagnosis not present

## 2017-09-21 DIAGNOSIS — D509 Iron deficiency anemia, unspecified: Secondary | ICD-10-CM | POA: Diagnosis not present

## 2017-09-21 DIAGNOSIS — D631 Anemia in chronic kidney disease: Secondary | ICD-10-CM | POA: Diagnosis not present

## 2017-09-21 DIAGNOSIS — N186 End stage renal disease: Secondary | ICD-10-CM | POA: Diagnosis not present

## 2017-09-21 DIAGNOSIS — N2581 Secondary hyperparathyroidism of renal origin: Secondary | ICD-10-CM | POA: Diagnosis not present

## 2017-09-21 DIAGNOSIS — Z992 Dependence on renal dialysis: Secondary | ICD-10-CM | POA: Diagnosis not present

## 2017-09-23 DIAGNOSIS — Z992 Dependence on renal dialysis: Secondary | ICD-10-CM | POA: Diagnosis not present

## 2017-09-23 DIAGNOSIS — D631 Anemia in chronic kidney disease: Secondary | ICD-10-CM | POA: Diagnosis not present

## 2017-09-23 DIAGNOSIS — D509 Iron deficiency anemia, unspecified: Secondary | ICD-10-CM | POA: Diagnosis not present

## 2017-09-23 DIAGNOSIS — N186 End stage renal disease: Secondary | ICD-10-CM | POA: Diagnosis not present

## 2017-09-23 DIAGNOSIS — N2581 Secondary hyperparathyroidism of renal origin: Secondary | ICD-10-CM | POA: Diagnosis not present

## 2017-09-25 DIAGNOSIS — N2581 Secondary hyperparathyroidism of renal origin: Secondary | ICD-10-CM | POA: Diagnosis not present

## 2017-09-25 DIAGNOSIS — D509 Iron deficiency anemia, unspecified: Secondary | ICD-10-CM | POA: Diagnosis not present

## 2017-09-25 DIAGNOSIS — Z992 Dependence on renal dialysis: Secondary | ICD-10-CM | POA: Diagnosis not present

## 2017-09-25 DIAGNOSIS — D631 Anemia in chronic kidney disease: Secondary | ICD-10-CM | POA: Diagnosis not present

## 2017-09-25 DIAGNOSIS — N186 End stage renal disease: Secondary | ICD-10-CM | POA: Diagnosis not present

## 2017-09-27 DIAGNOSIS — Z992 Dependence on renal dialysis: Secondary | ICD-10-CM | POA: Diagnosis not present

## 2017-09-27 DIAGNOSIS — N186 End stage renal disease: Secondary | ICD-10-CM | POA: Diagnosis not present

## 2017-09-27 DIAGNOSIS — N2581 Secondary hyperparathyroidism of renal origin: Secondary | ICD-10-CM | POA: Diagnosis not present

## 2017-09-27 DIAGNOSIS — D509 Iron deficiency anemia, unspecified: Secondary | ICD-10-CM | POA: Diagnosis not present

## 2017-09-27 DIAGNOSIS — D631 Anemia in chronic kidney disease: Secondary | ICD-10-CM | POA: Diagnosis not present

## 2017-09-30 DIAGNOSIS — N2581 Secondary hyperparathyroidism of renal origin: Secondary | ICD-10-CM | POA: Diagnosis not present

## 2017-09-30 DIAGNOSIS — D509 Iron deficiency anemia, unspecified: Secondary | ICD-10-CM | POA: Diagnosis not present

## 2017-09-30 DIAGNOSIS — N186 End stage renal disease: Secondary | ICD-10-CM | POA: Diagnosis not present

## 2017-09-30 DIAGNOSIS — Z992 Dependence on renal dialysis: Secondary | ICD-10-CM | POA: Diagnosis not present

## 2017-09-30 DIAGNOSIS — D631 Anemia in chronic kidney disease: Secondary | ICD-10-CM | POA: Diagnosis not present

## 2017-10-02 DIAGNOSIS — N2581 Secondary hyperparathyroidism of renal origin: Secondary | ICD-10-CM | POA: Diagnosis not present

## 2017-10-02 DIAGNOSIS — Z992 Dependence on renal dialysis: Secondary | ICD-10-CM | POA: Diagnosis not present

## 2017-10-02 DIAGNOSIS — D509 Iron deficiency anemia, unspecified: Secondary | ICD-10-CM | POA: Diagnosis not present

## 2017-10-02 DIAGNOSIS — N186 End stage renal disease: Secondary | ICD-10-CM | POA: Diagnosis not present

## 2017-10-02 DIAGNOSIS — D631 Anemia in chronic kidney disease: Secondary | ICD-10-CM | POA: Diagnosis not present

## 2017-10-04 DIAGNOSIS — Z992 Dependence on renal dialysis: Secondary | ICD-10-CM | POA: Diagnosis not present

## 2017-10-04 DIAGNOSIS — D631 Anemia in chronic kidney disease: Secondary | ICD-10-CM | POA: Diagnosis not present

## 2017-10-04 DIAGNOSIS — N2581 Secondary hyperparathyroidism of renal origin: Secondary | ICD-10-CM | POA: Diagnosis not present

## 2017-10-04 DIAGNOSIS — N186 End stage renal disease: Secondary | ICD-10-CM | POA: Diagnosis not present

## 2017-10-04 DIAGNOSIS — D509 Iron deficiency anemia, unspecified: Secondary | ICD-10-CM | POA: Diagnosis not present

## 2017-10-07 DIAGNOSIS — N2581 Secondary hyperparathyroidism of renal origin: Secondary | ICD-10-CM | POA: Diagnosis not present

## 2017-10-07 DIAGNOSIS — D631 Anemia in chronic kidney disease: Secondary | ICD-10-CM | POA: Diagnosis not present

## 2017-10-07 DIAGNOSIS — Z992 Dependence on renal dialysis: Secondary | ICD-10-CM | POA: Diagnosis not present

## 2017-10-07 DIAGNOSIS — N186 End stage renal disease: Secondary | ICD-10-CM | POA: Diagnosis not present

## 2017-10-07 DIAGNOSIS — D509 Iron deficiency anemia, unspecified: Secondary | ICD-10-CM | POA: Diagnosis not present

## 2017-10-09 DIAGNOSIS — D509 Iron deficiency anemia, unspecified: Secondary | ICD-10-CM | POA: Diagnosis not present

## 2017-10-09 DIAGNOSIS — N186 End stage renal disease: Secondary | ICD-10-CM | POA: Diagnosis not present

## 2017-10-09 DIAGNOSIS — Z992 Dependence on renal dialysis: Secondary | ICD-10-CM | POA: Diagnosis not present

## 2017-10-09 DIAGNOSIS — N2581 Secondary hyperparathyroidism of renal origin: Secondary | ICD-10-CM | POA: Diagnosis not present

## 2017-10-09 DIAGNOSIS — D631 Anemia in chronic kidney disease: Secondary | ICD-10-CM | POA: Diagnosis not present

## 2017-10-10 DIAGNOSIS — Z992 Dependence on renal dialysis: Secondary | ICD-10-CM | POA: Diagnosis not present

## 2017-10-10 DIAGNOSIS — N186 End stage renal disease: Secondary | ICD-10-CM | POA: Diagnosis not present

## 2017-10-11 DIAGNOSIS — Z992 Dependence on renal dialysis: Secondary | ICD-10-CM | POA: Diagnosis not present

## 2017-10-11 DIAGNOSIS — N2581 Secondary hyperparathyroidism of renal origin: Secondary | ICD-10-CM | POA: Diagnosis not present

## 2017-10-11 DIAGNOSIS — N186 End stage renal disease: Secondary | ICD-10-CM | POA: Diagnosis not present

## 2017-10-11 DIAGNOSIS — D631 Anemia in chronic kidney disease: Secondary | ICD-10-CM | POA: Diagnosis not present

## 2017-10-11 DIAGNOSIS — D509 Iron deficiency anemia, unspecified: Secondary | ICD-10-CM | POA: Diagnosis not present

## 2017-10-14 DIAGNOSIS — D509 Iron deficiency anemia, unspecified: Secondary | ICD-10-CM | POA: Diagnosis not present

## 2017-10-14 DIAGNOSIS — N2581 Secondary hyperparathyroidism of renal origin: Secondary | ICD-10-CM | POA: Diagnosis not present

## 2017-10-14 DIAGNOSIS — Z992 Dependence on renal dialysis: Secondary | ICD-10-CM | POA: Diagnosis not present

## 2017-10-14 DIAGNOSIS — D631 Anemia in chronic kidney disease: Secondary | ICD-10-CM | POA: Diagnosis not present

## 2017-10-14 DIAGNOSIS — N186 End stage renal disease: Secondary | ICD-10-CM | POA: Diagnosis not present

## 2017-10-18 DIAGNOSIS — D509 Iron deficiency anemia, unspecified: Secondary | ICD-10-CM | POA: Diagnosis not present

## 2017-10-18 DIAGNOSIS — N186 End stage renal disease: Secondary | ICD-10-CM | POA: Diagnosis not present

## 2017-10-18 DIAGNOSIS — D631 Anemia in chronic kidney disease: Secondary | ICD-10-CM | POA: Diagnosis not present

## 2017-10-18 DIAGNOSIS — N2581 Secondary hyperparathyroidism of renal origin: Secondary | ICD-10-CM | POA: Diagnosis not present

## 2017-10-18 DIAGNOSIS — Z992 Dependence on renal dialysis: Secondary | ICD-10-CM | POA: Diagnosis not present

## 2017-10-20 DIAGNOSIS — Z992 Dependence on renal dialysis: Secondary | ICD-10-CM | POA: Diagnosis not present

## 2017-10-20 DIAGNOSIS — D509 Iron deficiency anemia, unspecified: Secondary | ICD-10-CM | POA: Diagnosis not present

## 2017-10-20 DIAGNOSIS — N2581 Secondary hyperparathyroidism of renal origin: Secondary | ICD-10-CM | POA: Diagnosis not present

## 2017-10-20 DIAGNOSIS — N186 End stage renal disease: Secondary | ICD-10-CM | POA: Diagnosis not present

## 2017-10-20 DIAGNOSIS — D631 Anemia in chronic kidney disease: Secondary | ICD-10-CM | POA: Diagnosis not present

## 2017-10-21 DIAGNOSIS — N2581 Secondary hyperparathyroidism of renal origin: Secondary | ICD-10-CM | POA: Diagnosis not present

## 2017-10-21 DIAGNOSIS — D509 Iron deficiency anemia, unspecified: Secondary | ICD-10-CM | POA: Diagnosis not present

## 2017-10-21 DIAGNOSIS — D631 Anemia in chronic kidney disease: Secondary | ICD-10-CM | POA: Diagnosis not present

## 2017-10-21 DIAGNOSIS — N186 End stage renal disease: Secondary | ICD-10-CM | POA: Diagnosis not present

## 2017-10-21 DIAGNOSIS — Z992 Dependence on renal dialysis: Secondary | ICD-10-CM | POA: Diagnosis not present

## 2017-10-23 DIAGNOSIS — D509 Iron deficiency anemia, unspecified: Secondary | ICD-10-CM | POA: Diagnosis not present

## 2017-10-23 DIAGNOSIS — D631 Anemia in chronic kidney disease: Secondary | ICD-10-CM | POA: Diagnosis not present

## 2017-10-23 DIAGNOSIS — Z992 Dependence on renal dialysis: Secondary | ICD-10-CM | POA: Diagnosis not present

## 2017-10-23 DIAGNOSIS — N2581 Secondary hyperparathyroidism of renal origin: Secondary | ICD-10-CM | POA: Diagnosis not present

## 2017-10-23 DIAGNOSIS — N186 End stage renal disease: Secondary | ICD-10-CM | POA: Diagnosis not present

## 2017-10-25 DIAGNOSIS — Z992 Dependence on renal dialysis: Secondary | ICD-10-CM | POA: Diagnosis not present

## 2017-10-25 DIAGNOSIS — D631 Anemia in chronic kidney disease: Secondary | ICD-10-CM | POA: Diagnosis not present

## 2017-10-25 DIAGNOSIS — D509 Iron deficiency anemia, unspecified: Secondary | ICD-10-CM | POA: Diagnosis not present

## 2017-10-25 DIAGNOSIS — N2581 Secondary hyperparathyroidism of renal origin: Secondary | ICD-10-CM | POA: Diagnosis not present

## 2017-10-25 DIAGNOSIS — N186 End stage renal disease: Secondary | ICD-10-CM | POA: Diagnosis not present

## 2017-10-28 DIAGNOSIS — D509 Iron deficiency anemia, unspecified: Secondary | ICD-10-CM | POA: Diagnosis not present

## 2017-10-28 DIAGNOSIS — Z992 Dependence on renal dialysis: Secondary | ICD-10-CM | POA: Diagnosis not present

## 2017-10-28 DIAGNOSIS — N2581 Secondary hyperparathyroidism of renal origin: Secondary | ICD-10-CM | POA: Diagnosis not present

## 2017-10-28 DIAGNOSIS — N186 End stage renal disease: Secondary | ICD-10-CM | POA: Diagnosis not present

## 2017-10-28 DIAGNOSIS — D631 Anemia in chronic kidney disease: Secondary | ICD-10-CM | POA: Diagnosis not present

## 2017-10-30 ENCOUNTER — Encounter (INDEPENDENT_AMBULATORY_CARE_PROVIDER_SITE_OTHER): Payer: Self-pay

## 2017-10-30 DIAGNOSIS — D509 Iron deficiency anemia, unspecified: Secondary | ICD-10-CM | POA: Diagnosis not present

## 2017-10-30 DIAGNOSIS — Z992 Dependence on renal dialysis: Secondary | ICD-10-CM | POA: Diagnosis not present

## 2017-10-30 DIAGNOSIS — N186 End stage renal disease: Secondary | ICD-10-CM | POA: Diagnosis not present

## 2017-10-30 DIAGNOSIS — N2581 Secondary hyperparathyroidism of renal origin: Secondary | ICD-10-CM | POA: Diagnosis not present

## 2017-10-30 DIAGNOSIS — D631 Anemia in chronic kidney disease: Secondary | ICD-10-CM | POA: Diagnosis not present

## 2017-11-01 DIAGNOSIS — D631 Anemia in chronic kidney disease: Secondary | ICD-10-CM | POA: Diagnosis not present

## 2017-11-01 DIAGNOSIS — D509 Iron deficiency anemia, unspecified: Secondary | ICD-10-CM | POA: Diagnosis not present

## 2017-11-01 DIAGNOSIS — Z992 Dependence on renal dialysis: Secondary | ICD-10-CM | POA: Diagnosis not present

## 2017-11-01 DIAGNOSIS — N2581 Secondary hyperparathyroidism of renal origin: Secondary | ICD-10-CM | POA: Diagnosis not present

## 2017-11-01 DIAGNOSIS — N186 End stage renal disease: Secondary | ICD-10-CM | POA: Diagnosis not present

## 2017-11-04 DIAGNOSIS — N186 End stage renal disease: Secondary | ICD-10-CM | POA: Diagnosis not present

## 2017-11-04 DIAGNOSIS — D509 Iron deficiency anemia, unspecified: Secondary | ICD-10-CM | POA: Diagnosis not present

## 2017-11-04 DIAGNOSIS — N2581 Secondary hyperparathyroidism of renal origin: Secondary | ICD-10-CM | POA: Diagnosis not present

## 2017-11-04 DIAGNOSIS — Z992 Dependence on renal dialysis: Secondary | ICD-10-CM | POA: Diagnosis not present

## 2017-11-04 DIAGNOSIS — D631 Anemia in chronic kidney disease: Secondary | ICD-10-CM | POA: Diagnosis not present

## 2017-11-06 DIAGNOSIS — N186 End stage renal disease: Secondary | ICD-10-CM | POA: Diagnosis not present

## 2017-11-06 DIAGNOSIS — D631 Anemia in chronic kidney disease: Secondary | ICD-10-CM | POA: Diagnosis not present

## 2017-11-06 DIAGNOSIS — N2581 Secondary hyperparathyroidism of renal origin: Secondary | ICD-10-CM | POA: Diagnosis not present

## 2017-11-06 DIAGNOSIS — Z992 Dependence on renal dialysis: Secondary | ICD-10-CM | POA: Diagnosis not present

## 2017-11-06 DIAGNOSIS — D509 Iron deficiency anemia, unspecified: Secondary | ICD-10-CM | POA: Diagnosis not present

## 2017-11-10 DIAGNOSIS — Z992 Dependence on renal dialysis: Secondary | ICD-10-CM | POA: Diagnosis not present

## 2017-11-10 DIAGNOSIS — N186 End stage renal disease: Secondary | ICD-10-CM | POA: Diagnosis not present

## 2017-11-11 DIAGNOSIS — D509 Iron deficiency anemia, unspecified: Secondary | ICD-10-CM | POA: Diagnosis not present

## 2017-11-11 DIAGNOSIS — N2581 Secondary hyperparathyroidism of renal origin: Secondary | ICD-10-CM | POA: Diagnosis not present

## 2017-11-11 DIAGNOSIS — Z9289 Personal history of other medical treatment: Secondary | ICD-10-CM

## 2017-11-11 DIAGNOSIS — N186 End stage renal disease: Secondary | ICD-10-CM | POA: Diagnosis not present

## 2017-11-11 DIAGNOSIS — Z992 Dependence on renal dialysis: Secondary | ICD-10-CM | POA: Diagnosis not present

## 2017-11-11 DIAGNOSIS — D631 Anemia in chronic kidney disease: Secondary | ICD-10-CM | POA: Diagnosis not present

## 2017-11-11 HISTORY — DX: Personal history of other medical treatment: Z92.89

## 2017-11-13 DIAGNOSIS — N2581 Secondary hyperparathyroidism of renal origin: Secondary | ICD-10-CM | POA: Diagnosis not present

## 2017-11-13 DIAGNOSIS — N186 End stage renal disease: Secondary | ICD-10-CM | POA: Diagnosis not present

## 2017-11-13 DIAGNOSIS — D509 Iron deficiency anemia, unspecified: Secondary | ICD-10-CM | POA: Diagnosis not present

## 2017-11-13 DIAGNOSIS — Z992 Dependence on renal dialysis: Secondary | ICD-10-CM | POA: Diagnosis not present

## 2017-11-13 DIAGNOSIS — D631 Anemia in chronic kidney disease: Secondary | ICD-10-CM | POA: Diagnosis not present

## 2017-11-15 DIAGNOSIS — G8929 Other chronic pain: Secondary | ICD-10-CM | POA: Diagnosis not present

## 2017-11-15 DIAGNOSIS — D509 Iron deficiency anemia, unspecified: Secondary | ICD-10-CM | POA: Diagnosis not present

## 2017-11-15 DIAGNOSIS — Z79899 Other long term (current) drug therapy: Secondary | ICD-10-CM | POA: Diagnosis not present

## 2017-11-15 DIAGNOSIS — I509 Heart failure, unspecified: Secondary | ICD-10-CM | POA: Diagnosis not present

## 2017-11-15 DIAGNOSIS — Z992 Dependence on renal dialysis: Secondary | ICD-10-CM | POA: Diagnosis not present

## 2017-11-15 DIAGNOSIS — M545 Low back pain: Secondary | ICD-10-CM | POA: Diagnosis not present

## 2017-11-15 DIAGNOSIS — I252 Old myocardial infarction: Secondary | ICD-10-CM | POA: Diagnosis not present

## 2017-11-15 DIAGNOSIS — I132 Hypertensive heart and chronic kidney disease with heart failure and with stage 5 chronic kidney disease, or end stage renal disease: Secondary | ICD-10-CM | POA: Diagnosis not present

## 2017-11-15 DIAGNOSIS — R0602 Shortness of breath: Secondary | ICD-10-CM | POA: Diagnosis not present

## 2017-11-15 DIAGNOSIS — Z87891 Personal history of nicotine dependence: Secondary | ICD-10-CM | POA: Diagnosis not present

## 2017-11-15 DIAGNOSIS — N2581 Secondary hyperparathyroidism of renal origin: Secondary | ICD-10-CM | POA: Diagnosis not present

## 2017-11-15 DIAGNOSIS — M546 Pain in thoracic spine: Secondary | ICD-10-CM | POA: Diagnosis not present

## 2017-11-15 DIAGNOSIS — D631 Anemia in chronic kidney disease: Secondary | ICD-10-CM | POA: Diagnosis not present

## 2017-11-15 DIAGNOSIS — E1122 Type 2 diabetes mellitus with diabetic chronic kidney disease: Secondary | ICD-10-CM | POA: Diagnosis not present

## 2017-11-15 DIAGNOSIS — N186 End stage renal disease: Secondary | ICD-10-CM | POA: Diagnosis not present

## 2017-11-16 DIAGNOSIS — M546 Pain in thoracic spine: Secondary | ICD-10-CM | POA: Diagnosis not present

## 2017-11-16 DIAGNOSIS — M545 Low back pain: Secondary | ICD-10-CM | POA: Diagnosis not present

## 2017-11-17 DIAGNOSIS — N186 End stage renal disease: Secondary | ICD-10-CM | POA: Diagnosis not present

## 2017-11-17 DIAGNOSIS — Z87891 Personal history of nicotine dependence: Secondary | ICD-10-CM | POA: Diagnosis not present

## 2017-11-17 DIAGNOSIS — G8929 Other chronic pain: Secondary | ICD-10-CM | POA: Diagnosis not present

## 2017-11-17 DIAGNOSIS — Z79899 Other long term (current) drug therapy: Secondary | ICD-10-CM | POA: Diagnosis not present

## 2017-11-17 DIAGNOSIS — M545 Low back pain: Secondary | ICD-10-CM | POA: Diagnosis not present

## 2017-11-17 DIAGNOSIS — M546 Pain in thoracic spine: Secondary | ICD-10-CM | POA: Diagnosis not present

## 2017-11-17 DIAGNOSIS — R0602 Shortness of breath: Secondary | ICD-10-CM | POA: Diagnosis not present

## 2017-11-17 DIAGNOSIS — Z992 Dependence on renal dialysis: Secondary | ICD-10-CM | POA: Diagnosis not present

## 2017-11-17 DIAGNOSIS — E1122 Type 2 diabetes mellitus with diabetic chronic kidney disease: Secondary | ICD-10-CM | POA: Diagnosis not present

## 2017-11-17 DIAGNOSIS — I132 Hypertensive heart and chronic kidney disease with heart failure and with stage 5 chronic kidney disease, or end stage renal disease: Secondary | ICD-10-CM | POA: Diagnosis not present

## 2017-11-17 DIAGNOSIS — R079 Chest pain, unspecified: Secondary | ICD-10-CM | POA: Diagnosis not present

## 2017-11-17 DIAGNOSIS — I509 Heart failure, unspecified: Secondary | ICD-10-CM | POA: Diagnosis not present

## 2017-11-17 DIAGNOSIS — R0789 Other chest pain: Secondary | ICD-10-CM | POA: Diagnosis not present

## 2017-11-18 DIAGNOSIS — N186 End stage renal disease: Secondary | ICD-10-CM | POA: Diagnosis not present

## 2017-11-18 DIAGNOSIS — D509 Iron deficiency anemia, unspecified: Secondary | ICD-10-CM | POA: Diagnosis not present

## 2017-11-18 DIAGNOSIS — Z992 Dependence on renal dialysis: Secondary | ICD-10-CM | POA: Diagnosis not present

## 2017-11-18 DIAGNOSIS — N2581 Secondary hyperparathyroidism of renal origin: Secondary | ICD-10-CM | POA: Diagnosis not present

## 2017-11-18 DIAGNOSIS — D631 Anemia in chronic kidney disease: Secondary | ICD-10-CM | POA: Diagnosis not present

## 2017-11-19 DIAGNOSIS — N2581 Secondary hyperparathyroidism of renal origin: Secondary | ICD-10-CM | POA: Diagnosis not present

## 2017-11-19 DIAGNOSIS — D631 Anemia in chronic kidney disease: Secondary | ICD-10-CM | POA: Diagnosis not present

## 2017-11-19 DIAGNOSIS — Z992 Dependence on renal dialysis: Secondary | ICD-10-CM | POA: Diagnosis not present

## 2017-11-19 DIAGNOSIS — D509 Iron deficiency anemia, unspecified: Secondary | ICD-10-CM | POA: Diagnosis not present

## 2017-11-19 DIAGNOSIS — N186 End stage renal disease: Secondary | ICD-10-CM | POA: Diagnosis not present

## 2017-11-20 DIAGNOSIS — D509 Iron deficiency anemia, unspecified: Secondary | ICD-10-CM | POA: Diagnosis not present

## 2017-11-20 DIAGNOSIS — N2581 Secondary hyperparathyroidism of renal origin: Secondary | ICD-10-CM | POA: Diagnosis not present

## 2017-11-20 DIAGNOSIS — D631 Anemia in chronic kidney disease: Secondary | ICD-10-CM | POA: Diagnosis not present

## 2017-11-20 DIAGNOSIS — Z992 Dependence on renal dialysis: Secondary | ICD-10-CM | POA: Diagnosis not present

## 2017-11-20 DIAGNOSIS — N186 End stage renal disease: Secondary | ICD-10-CM | POA: Diagnosis not present

## 2017-11-22 DIAGNOSIS — Z992 Dependence on renal dialysis: Secondary | ICD-10-CM | POA: Diagnosis not present

## 2017-11-22 DIAGNOSIS — N2581 Secondary hyperparathyroidism of renal origin: Secondary | ICD-10-CM | POA: Diagnosis not present

## 2017-11-22 DIAGNOSIS — N186 End stage renal disease: Secondary | ICD-10-CM | POA: Diagnosis not present

## 2017-11-22 DIAGNOSIS — D631 Anemia in chronic kidney disease: Secondary | ICD-10-CM | POA: Diagnosis not present

## 2017-11-22 DIAGNOSIS — D509 Iron deficiency anemia, unspecified: Secondary | ICD-10-CM | POA: Diagnosis not present

## 2017-11-25 DIAGNOSIS — D631 Anemia in chronic kidney disease: Secondary | ICD-10-CM | POA: Diagnosis not present

## 2017-11-25 DIAGNOSIS — N186 End stage renal disease: Secondary | ICD-10-CM | POA: Diagnosis not present

## 2017-11-25 DIAGNOSIS — N2581 Secondary hyperparathyroidism of renal origin: Secondary | ICD-10-CM | POA: Diagnosis not present

## 2017-11-25 DIAGNOSIS — Z992 Dependence on renal dialysis: Secondary | ICD-10-CM | POA: Diagnosis not present

## 2017-11-25 DIAGNOSIS — D509 Iron deficiency anemia, unspecified: Secondary | ICD-10-CM | POA: Diagnosis not present

## 2017-11-27 DIAGNOSIS — D631 Anemia in chronic kidney disease: Secondary | ICD-10-CM | POA: Diagnosis not present

## 2017-11-27 DIAGNOSIS — N2581 Secondary hyperparathyroidism of renal origin: Secondary | ICD-10-CM | POA: Diagnosis not present

## 2017-11-27 DIAGNOSIS — N186 End stage renal disease: Secondary | ICD-10-CM | POA: Diagnosis not present

## 2017-11-27 DIAGNOSIS — Z992 Dependence on renal dialysis: Secondary | ICD-10-CM | POA: Diagnosis not present

## 2017-11-27 DIAGNOSIS — D509 Iron deficiency anemia, unspecified: Secondary | ICD-10-CM | POA: Diagnosis not present

## 2017-11-29 DIAGNOSIS — D631 Anemia in chronic kidney disease: Secondary | ICD-10-CM | POA: Diagnosis not present

## 2017-11-29 DIAGNOSIS — D509 Iron deficiency anemia, unspecified: Secondary | ICD-10-CM | POA: Diagnosis not present

## 2017-11-29 DIAGNOSIS — N2581 Secondary hyperparathyroidism of renal origin: Secondary | ICD-10-CM | POA: Diagnosis not present

## 2017-11-29 DIAGNOSIS — Z992 Dependence on renal dialysis: Secondary | ICD-10-CM | POA: Diagnosis not present

## 2017-11-29 DIAGNOSIS — N186 End stage renal disease: Secondary | ICD-10-CM | POA: Diagnosis not present

## 2017-12-02 DIAGNOSIS — N186 End stage renal disease: Secondary | ICD-10-CM | POA: Diagnosis not present

## 2017-12-02 DIAGNOSIS — D509 Iron deficiency anemia, unspecified: Secondary | ICD-10-CM | POA: Diagnosis not present

## 2017-12-02 DIAGNOSIS — Z992 Dependence on renal dialysis: Secondary | ICD-10-CM | POA: Diagnosis not present

## 2017-12-02 DIAGNOSIS — D631 Anemia in chronic kidney disease: Secondary | ICD-10-CM | POA: Diagnosis not present

## 2017-12-02 DIAGNOSIS — N2581 Secondary hyperparathyroidism of renal origin: Secondary | ICD-10-CM | POA: Diagnosis not present

## 2017-12-06 DIAGNOSIS — D509 Iron deficiency anemia, unspecified: Secondary | ICD-10-CM | POA: Diagnosis not present

## 2017-12-06 DIAGNOSIS — D631 Anemia in chronic kidney disease: Secondary | ICD-10-CM | POA: Diagnosis not present

## 2017-12-06 DIAGNOSIS — N2581 Secondary hyperparathyroidism of renal origin: Secondary | ICD-10-CM | POA: Diagnosis not present

## 2017-12-06 DIAGNOSIS — Z992 Dependence on renal dialysis: Secondary | ICD-10-CM | POA: Diagnosis not present

## 2017-12-06 DIAGNOSIS — N186 End stage renal disease: Secondary | ICD-10-CM | POA: Diagnosis not present

## 2017-12-09 DIAGNOSIS — N2581 Secondary hyperparathyroidism of renal origin: Secondary | ICD-10-CM | POA: Diagnosis not present

## 2017-12-09 DIAGNOSIS — D509 Iron deficiency anemia, unspecified: Secondary | ICD-10-CM | POA: Diagnosis not present

## 2017-12-09 DIAGNOSIS — D631 Anemia in chronic kidney disease: Secondary | ICD-10-CM | POA: Diagnosis not present

## 2017-12-09 DIAGNOSIS — N186 End stage renal disease: Secondary | ICD-10-CM | POA: Diagnosis not present

## 2017-12-09 DIAGNOSIS — Z992 Dependence on renal dialysis: Secondary | ICD-10-CM | POA: Diagnosis not present

## 2017-12-10 DIAGNOSIS — N186 End stage renal disease: Secondary | ICD-10-CM | POA: Diagnosis not present

## 2017-12-10 DIAGNOSIS — Z992 Dependence on renal dialysis: Secondary | ICD-10-CM | POA: Diagnosis not present

## 2017-12-11 DIAGNOSIS — D509 Iron deficiency anemia, unspecified: Secondary | ICD-10-CM | POA: Diagnosis not present

## 2017-12-11 DIAGNOSIS — D631 Anemia in chronic kidney disease: Secondary | ICD-10-CM | POA: Diagnosis not present

## 2017-12-11 DIAGNOSIS — N2581 Secondary hyperparathyroidism of renal origin: Secondary | ICD-10-CM | POA: Diagnosis not present

## 2017-12-11 DIAGNOSIS — N186 End stage renal disease: Secondary | ICD-10-CM | POA: Diagnosis not present

## 2017-12-11 DIAGNOSIS — Z992 Dependence on renal dialysis: Secondary | ICD-10-CM | POA: Diagnosis not present

## 2017-12-16 DIAGNOSIS — N186 End stage renal disease: Secondary | ICD-10-CM | POA: Diagnosis not present

## 2017-12-16 DIAGNOSIS — N2581 Secondary hyperparathyroidism of renal origin: Secondary | ICD-10-CM | POA: Diagnosis not present

## 2017-12-16 DIAGNOSIS — D631 Anemia in chronic kidney disease: Secondary | ICD-10-CM | POA: Diagnosis not present

## 2017-12-16 DIAGNOSIS — D509 Iron deficiency anemia, unspecified: Secondary | ICD-10-CM | POA: Diagnosis not present

## 2017-12-16 DIAGNOSIS — Z992 Dependence on renal dialysis: Secondary | ICD-10-CM | POA: Diagnosis not present

## 2017-12-18 DIAGNOSIS — D631 Anemia in chronic kidney disease: Secondary | ICD-10-CM | POA: Diagnosis not present

## 2017-12-18 DIAGNOSIS — Z992 Dependence on renal dialysis: Secondary | ICD-10-CM | POA: Diagnosis not present

## 2017-12-18 DIAGNOSIS — D509 Iron deficiency anemia, unspecified: Secondary | ICD-10-CM | POA: Diagnosis not present

## 2017-12-18 DIAGNOSIS — N2581 Secondary hyperparathyroidism of renal origin: Secondary | ICD-10-CM | POA: Diagnosis not present

## 2017-12-18 DIAGNOSIS — N186 End stage renal disease: Secondary | ICD-10-CM | POA: Diagnosis not present

## 2017-12-19 ENCOUNTER — Encounter: Payer: Self-pay | Admitting: Family Medicine

## 2017-12-19 ENCOUNTER — Ambulatory Visit (INDEPENDENT_AMBULATORY_CARE_PROVIDER_SITE_OTHER): Payer: Medicare Other | Admitting: Family Medicine

## 2017-12-19 ENCOUNTER — Telehealth: Payer: Self-pay | Admitting: Family Medicine

## 2017-12-19 ENCOUNTER — Other Ambulatory Visit: Payer: Self-pay

## 2017-12-19 VITALS — BP 144/68 | HR 77 | Temp 98.7°F | Resp 20 | Ht 67.0 in | Wt 203.1 lb

## 2017-12-19 DIAGNOSIS — Z23 Encounter for immunization: Secondary | ICD-10-CM | POA: Diagnosis not present

## 2017-12-19 DIAGNOSIS — M545 Low back pain, unspecified: Secondary | ICD-10-CM | POA: Insufficient documentation

## 2017-12-19 DIAGNOSIS — Z992 Dependence on renal dialysis: Secondary | ICD-10-CM | POA: Diagnosis not present

## 2017-12-19 DIAGNOSIS — Z113 Encounter for screening for infections with a predominantly sexual mode of transmission: Secondary | ICD-10-CM | POA: Diagnosis not present

## 2017-12-19 DIAGNOSIS — G8929 Other chronic pain: Secondary | ICD-10-CM

## 2017-12-19 DIAGNOSIS — N186 End stage renal disease: Secondary | ICD-10-CM | POA: Diagnosis not present

## 2017-12-19 DIAGNOSIS — E1169 Type 2 diabetes mellitus with other specified complication: Secondary | ICD-10-CM | POA: Diagnosis not present

## 2017-12-19 DIAGNOSIS — E1122 Type 2 diabetes mellitus with diabetic chronic kidney disease: Secondary | ICD-10-CM | POA: Insufficient documentation

## 2017-12-19 DIAGNOSIS — E108 Type 1 diabetes mellitus with unspecified complications: Secondary | ICD-10-CM | POA: Diagnosis not present

## 2017-12-19 DIAGNOSIS — D509 Iron deficiency anemia, unspecified: Secondary | ICD-10-CM | POA: Diagnosis not present

## 2017-12-19 DIAGNOSIS — N2581 Secondary hyperparathyroidism of renal origin: Secondary | ICD-10-CM | POA: Diagnosis not present

## 2017-12-19 DIAGNOSIS — D631 Anemia in chronic kidney disease: Secondary | ICD-10-CM | POA: Diagnosis not present

## 2017-12-19 MED ORDER — CYCLOBENZAPRINE HCL 10 MG PO TABS: 10.0000 mg | ORAL_TABLET | Freq: Three times a day (TID) | ORAL | 0 refills | Status: DC | PRN

## 2017-12-19 MED ORDER — MELOXICAM 7.5 MG PO TABS: 7.5000 mg | ORAL_TABLET | Freq: Every day | ORAL | 0 refills | Status: DC | PRN

## 2017-12-19 NOTE — Progress Notes (Signed)
Patient ID: Alejandro Lee, male    DOB: 1961/01/20, 57 y.o.   MRN: [id number]  Chief Complaint  Patient presents with  . Establish Care    Allergies Patient has no allergy information on record.  Subjective:   Alejandro Lee is a 57 y.o. male who presents to [facility] today.  HPI [name] is a 57 year old African-American male who presents today to establish care as a new patient.  He reportedly moved to this area after living in [place] for quite some time.  He is accompanied today for most of the visit by his fianc.  He reports that he has end-stage renal disease secondary to uncontrolled diabetes and hypertension.  He is reportedly being seen and evaluated and awaiting kidney transplant from [facility].  He is currently receiving dialysis on MWF at [facility] dialysis center. Seen by nephrology, but he is not sure of his doctors name.  He reports that he has been on dialysis for over 5 years.    He reports that he has been diabetic since he was a child.  He tells me that he is a type I diabetic but he has not had his insulin in over 2 years.  He reports he is not sure how his sugars are running and he has been feeling fine.  He reports that in order to get a transplant he will need to have an endocrinologist who will plan on managing his diabetes.  He reports that when he goes to get his dialysis his sugars always seem to be running well.  He is not sure of the medications which she is taking for his blood pressure but he reports that those are given to him by his nephrologist.  Reports that BP is usually high in the am and then it goes down with the dialysis. Reports that nephrologist is giving him the BP medications.   He reports that he also has a history of back pain.  He reports that he has bone spurs from his third to his 12 vertebrae.  He reports that the ED doctor in gave him prednisone to help with his back pain and that helped tremendously.  He is requesting a  medication for pain at this time.  He reports that the emergency department gave him etodolac and it worked very well.  He reports that he also would like a refill of his muscle relaxer.  He reports that he occasionally needs that after he gets dialysis.  He reports that his pain is in the center of his back and towards the right side.  He denies any radiation of pain down his legs or into his hips.  He reports that the pain is mainly over his back.  He reports he is concerned and bothered that he has not been evaluated for this other than getting a CT of his back.  He reports he is told many physicians about his pain and he would like to get a referral to see someone who can help him with his back.  He reports he would like medication for pain but does not wish to be on chronic narcotic pain medications.  He denies any GI pain.  He denies any epigastric pain.  Denies any belching or burping.  Patient reports that he has no numbness or tingling in his extremities.  He has good use of his legs.  He does report that he has a history of congestive heart failure.  He is  unsure of the etiology.  He reports that he would like for me to complete a form for him to get a lifelong handicap placard based upon this.  He reports he has seen a cardiologist at [facility] secondary to him getting approval for his transplant.  He reports that it is harder for him to walk long distance because he can get winded.  He reports he only gets swelling in his extremities if he misses his dialysis.   Past Medical History:  Diagnosis Date  . CHF (congestive heart failure) ([place])   . Chronic kidney disease   . Diabetes mellitus without complication ([place])   . Hyperlipidemia   . Hypertension     Past Surgical History:  Procedure Laterality Date  . AV FISTULA PLACEMENT Right   . EYE SURGERY    . lens replacement Left   . REFRACTIVE SURGERY Bilateral     Family History  Problem Relation Age of Onset  . Diabetes Mother   .  Hypertension Mother   . Diabetes Father   . Heart disease Father   . Cancer Father   . Hypertension Father   . Diabetes Sister   . Hypertension Sister   . Diabetes Brother   . Hypertension Brother   . Cancer Brother      Social History   Socioeconomic History  . Marital status: Significant Other    Spouse name: Not on file  . Number of children: Not on file  . Years of education: Not on file  . Highest education level: Not on file  Occupational History  . Not on file  Social Needs  . Financial resource strain: Very hard  . Food insecurity:    Worry: Often true    Inability: Often true  . Transportation needs:    Medical: No    Non-medical: No  Tobacco Use  . Smoking status: Former Research scientist (life sciences)  . Smokeless tobacco: Never Used  Substance and Sexual Activity  . Alcohol use: Not Currently  . Drug use: Never  . Sexual activity: Yes  Lifestyle  . Physical activity:    Days per week: 7 days    Minutes per session: 10 min  . Stress: Very much  Relationships  . Social connections:    Talks on phone: More than three times a week    Gets together: Never    Attends religious service: Never    Active member of club or organization: No    Attends meetings of clubs or organizations: Never    Relationship status: Living with partner  Other Topics Concern  . Not on file  Social History Narrative  . Not on file   Current Outpatient Medications on File Prior to Visit  Medication Sig Dispense Refill  . amLODipine (NORVASC) 10 MG tablet Take 10 mg by mouth daily.    . cinacalcet (SENSIPAR) 30 MG tablet Take 30 mg by mouth daily.    . cyclobenzaprine (FLEXERIL) 10 MG tablet Take 10 mg by mouth 2 (two) times daily as needed for muscle spasms (cramping).    . furosemide (LASIX) 40 MG tablet Take 40 mg by mouth daily.    . hydrALAZINE (APRESOLINE) 10 MG tablet Take 10 mg by mouth daily.    Marland Kitchen lisinopril (PRINIVIL,ZESTRIL) 10 MG tablet Take 10 mg by mouth daily.    . calcium acetate  (PHOSLO) 667 MG capsule Take 2,001 mg by mouth 3 (three) times daily.    . insulin NPH-regular Human (HUMULIN 70/30) (70-30) 100  UNIT/ML injection Inject 0-10 Units into the skin 2 (two) times daily. Patient used sliding scale     No current facility-administered medications on file prior to visit.     Review of Systems  Constitutional: Negative for appetite change, chills, fever and unexpected weight change.  HENT: Negative for trouble swallowing and voice change.   Eyes: Negative for visual disturbance.  Respiratory: Negative for cough, chest tightness, shortness of breath and wheezing.   Cardiovascular: Negative for chest pain, palpitations and leg swelling.  Gastrointestinal: Negative for abdominal pain, diarrhea, nausea and vomiting.  Musculoskeletal: Positive for back pain. Negative for arthralgias, joint swelling, neck pain and neck stiffness.  Skin: Negative for rash.  Neurological: Negative for dizziness, tremors, syncope, facial asymmetry, weakness, light-headedness and headaches.  Hematological: Negative for adenopathy. Does not bruise/bleed easily.     Objective:   BP (!) 144/68 (BP Location: Left Arm, Patient Position: Sitting, Cuff Size: Large)   Pulse 77   Temp 98.7 F (37.1 C) (Oral)   Resp 20   Ht 5\' 7"  (1.702 m)   Wt 203 lb 1.9 oz (92.1 kg)   SpO2 91%   BMI 31.81 kg/m   Physical Exam  Constitutional: He is oriented to person, place, and time. He appears well-developed and well-nourished.  HENT:  Head: Normocephalic and atraumatic.  Eyes: Pupils are equal, round, and reactive to light. Conjunctivae are normal.  Cardiovascular: Normal rate, regular rhythm and normal heart sounds.  Pulmonary/Chest: Effort normal and breath sounds normal. No respiratory distress.  Abdominal: Soft. Bowel sounds are normal.  Musculoskeletal: He exhibits no edema or deformity.  Strength 5 out of 5 in upper and lower extremities.  Neurological: He is alert and oriented to person,  place, and time. No cranial nerve deficit.  Skin: Skin is warm and dry.  Vitals reviewed.    Assessment and Plan  1. Screen for STD (sexually transmitted disease) Screening requested per patient. - HIV antibody - Hepatitis C antibody  2. Immunization due Vaccinations given.  Patient will try to obtain his immunization record. - Tdap vaccine greater than or equal to 7yo IM  3. Type 2 diabetes mellitus with complications ([place])  I did explain to patient that until I received some baseline blood work that I was not comfortable with giving him insulin and needed to document his need for this.  He is agreeable to getting some lab tests today.  We will also try to obtain his records.  I can see that he has had some labs at [facility] but there is no hemoglobin A1c that is been checked. - Ambulatory referral to Endocrinology - Hemoglobin A1c - Lipid panel  4. Chronic right-sided low back pain without sciatica X-ray report reviewed which was brought in by patient.  Will refer to orthopedic for evaluation.  I did give him a prescription for meloxicam. Discussed risks of cardiovascular thrombotic events related to NSAIDS. Discussed increased risk of AMI and CVA. Discussed risk of serious GI adverse events including bleeding, ulcers, and perforation. Patient understands risks of this medication.  We did discuss possible side effects.  He was told not to exceed this dose but it would be okay with his hemodialysis.  Referral was placed.  Also did give him a muscle relaxer.  He was told he could use heat to his low back 20 minutes 3 times a day as needed.  He was counseled concerning any worrisome signs and symptoms of low back pain and if those occur he  will contact medical help or go to the urgent care clinic. - Ambulatory referral to Orthopedic Surgery - meloxicam (MOBIC) 7.5 MG tablet; Take 1 tablet (7.5 mg total) by mouth daily as needed for pain.  Dispense: 30 tablet; Refill: 0 - cyclobenzaprine  (FLEXERIL) 10 MG tablet; Take 1 tablet (10 mg total) by mouth 3 (three) times daily as needed for muscle spasms.  Dispense: 30 tablet; Refill: 0  We will try to get patient an urgent appointment with endocrinology.  He will call our office if he has not heard of appointment date within the next week.  In addition, we will contact him with his labs.  He is asked to have his records sent to our office.   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  He does not drive and has assistive transportation.  He has apparently been followed at [facility] for ophthalmology, cardiology, and transplant.  I am not sure who has been following him from an endocrine perspective. Return in about 1 month (around 01/16/2018). [name], MD 12/19/2017

## 2017-12-20 ENCOUNTER — Encounter: Payer: Self-pay | Admitting: Family Medicine

## 2017-12-20 DIAGNOSIS — D509 Iron deficiency anemia, unspecified: Secondary | ICD-10-CM | POA: Diagnosis not present

## 2017-12-20 DIAGNOSIS — N2581 Secondary hyperparathyroidism of renal origin: Secondary | ICD-10-CM | POA: Diagnosis not present

## 2017-12-20 DIAGNOSIS — D631 Anemia in chronic kidney disease: Secondary | ICD-10-CM | POA: Diagnosis not present

## 2017-12-20 DIAGNOSIS — Z992 Dependence on renal dialysis: Secondary | ICD-10-CM | POA: Diagnosis not present

## 2017-12-20 DIAGNOSIS — N186 End stage renal disease: Secondary | ICD-10-CM | POA: Diagnosis not present

## 2017-12-20 LAB — LIPID PANEL
CHOL/HDL RATIO: 4.4 (calc) (ref <5.0)
CHOLESTEROL: 161 mg/dL (ref <200)
HDL: 37 mg/dL — ABNORMAL LOW (ref >40)
LDL CHOLESTEROL (CALC): 104 mg/dL — AB
NON-HDL CHOLESTEROL (CALC): 124 mg/dL (ref <130)
Triglycerides: 104 mg/dL (ref <150)

## 2017-12-20 LAB — HEMOGLOBIN A1C
Hgb A1c MFr Bld: 7.1 % of total Hgb — ABNORMAL HIGH (ref <5.7)
Mean Plasma Glucose: 157 (calc)
eAG (mmol/L): 8.7 (calc)

## 2017-12-20 LAB — HEPATITIS C ANTIBODY
Hepatitis C Ab: NONREACTIVE
SIGNAL TO CUT-OFF: 0.04 (ref <1.00)

## 2017-12-20 LAB — HIV ANTIBODY (ROUTINE TESTING W REFLEX): HIV: NONREACTIVE

## 2017-12-23 DIAGNOSIS — N186 End stage renal disease: Secondary | ICD-10-CM | POA: Diagnosis not present

## 2017-12-23 DIAGNOSIS — D631 Anemia in chronic kidney disease: Secondary | ICD-10-CM | POA: Diagnosis not present

## 2017-12-23 DIAGNOSIS — D509 Iron deficiency anemia, unspecified: Secondary | ICD-10-CM | POA: Diagnosis not present

## 2017-12-23 DIAGNOSIS — Z992 Dependence on renal dialysis: Secondary | ICD-10-CM | POA: Diagnosis not present

## 2017-12-23 DIAGNOSIS — N2581 Secondary hyperparathyroidism of renal origin: Secondary | ICD-10-CM | POA: Diagnosis not present

## 2017-12-25 ENCOUNTER — Ambulatory Visit (INDEPENDENT_AMBULATORY_CARE_PROVIDER_SITE_OTHER): Payer: Medicare Other | Admitting: Vascular Surgery

## 2017-12-25 ENCOUNTER — Other Ambulatory Visit: Payer: Self-pay

## 2017-12-25 ENCOUNTER — Other Ambulatory Visit: Payer: Self-pay | Admitting: *Deleted

## 2017-12-25 ENCOUNTER — Encounter: Payer: Self-pay | Admitting: Vascular Surgery

## 2017-12-25 ENCOUNTER — Encounter: Payer: Self-pay | Admitting: *Deleted

## 2017-12-25 DIAGNOSIS — T82510A Breakdown (mechanical) of surgically created arteriovenous fistula, initial encounter: Secondary | ICD-10-CM

## 2017-12-25 DIAGNOSIS — N2581 Secondary hyperparathyroidism of renal origin: Secondary | ICD-10-CM | POA: Diagnosis not present

## 2017-12-25 DIAGNOSIS — T82898A Other specified complication of vascular prosthetic devices, implants and grafts, initial encounter: Secondary | ICD-10-CM | POA: Diagnosis not present

## 2017-12-25 DIAGNOSIS — D631 Anemia in chronic kidney disease: Secondary | ICD-10-CM | POA: Diagnosis not present

## 2017-12-25 DIAGNOSIS — N186 End stage renal disease: Secondary | ICD-10-CM | POA: Diagnosis not present

## 2017-12-25 DIAGNOSIS — D509 Iron deficiency anemia, unspecified: Secondary | ICD-10-CM | POA: Diagnosis not present

## 2017-12-25 DIAGNOSIS — Z992 Dependence on renal dialysis: Secondary | ICD-10-CM | POA: Diagnosis not present

## 2017-12-25 NOTE — Progress Notes (Signed)
Letter faxed to [facility] for transportation and letter given to patient also for [facility].

## 2017-12-25 NOTE — Progress Notes (Signed)
Requested by:  Fran Lowes, MD [address] [place], [place] [address]  Reason for consultation: thinning R arm fistula   History of Present Illness   Alejandro Lee is a 57 y.o. (April 15, 1961) male who presents for cc: thin area in R arm fistula.  Pt denies any steal sx.  He notes he has had multiple bleeding episodes in the R arm that resolve with compression.  Previously the R AVF was cannulated with a button hole technique.  They have stopped doing and avoid cannulating the aneurysmal segment.  Past Medical History:  Diagnosis Date  . CHF (congestive heart failure) ([place])   . Chronic kidney disease   . Diabetes mellitus without complication ([place])   . Hyperlipidemia   . Hypertension     Past Surgical History:  Procedure Laterality Date  . AV FISTULA PLACEMENT Right   . EYE SURGERY    . lens replacement Left   . REFRACTIVE SURGERY Bilateral     Social History   Socioeconomic History  . Marital status: Significant Other    Spouse name: Not on file  . Number of children: Not on file  . Years of education: Not on file  . Highest education level: Not on file  Occupational History  . Not on file  Social Needs  . Financial resource strain: Very hard  . Food insecurity:    Worry: Often true    Inability: Often true  . Transportation needs:    Medical: No    Non-medical: No  Tobacco Use  . Smoking status: Former Research scientist (life sciences)  . Smokeless tobacco: Never Used  Substance and Sexual Activity  . Alcohol use: Not Currently  . Drug use: Never  . Sexual activity: Yes  Lifestyle  . Physical activity:    Days per week: 7 days    Minutes per session: 10 min  . Stress: Very much  Relationships  . Social connections:    Talks on phone: More than three times a week    Gets together: Never    Attends religious service: Never    Active member of club or organization: No    Attends meetings of clubs or organizations: Never    Relationship status: Living with partner  .  Intimate partner violence:    Fear of current or ex partner: No    Emotionally abused: No    Physically abused: No    Forced sexual activity: No  Other Topics Concern  . Not on file  Social History Narrative  . Not on file    Family History  Problem Relation Age of Onset  . Diabetes Mother   . Hypertension Mother   . Diabetes Father   . Heart disease Father   . Cancer Father   . Hypertension Father   . Diabetes Sister   . Hypertension Sister   . Diabetes Brother   . Hypertension Brother   . Cancer Brother     Current Outpatient Medications  Medication Sig Dispense Refill  . calcium acetate (PHOSLO) 667 MG capsule Take 2,001 mg by mouth 3 (three) times daily.    . cinacalcet (SENSIPAR) 30 MG tablet Take 30 mg by mouth daily.    Marland Kitchen lisinopril (PRINIVIL,ZESTRIL) 10 MG tablet Take 10 mg by mouth daily.    Marland Kitchen amLODipine (NORVASC) 10 MG tablet Take 10 mg by mouth daily.    . cyclobenzaprine (FLEXERIL) 10 MG tablet Take 10 mg by mouth 2 (two) times daily as needed for muscle spasms (  cramping).    . cyclobenzaprine (FLEXERIL) 10 MG tablet Take 1 tablet (10 mg total) by mouth 3 (three) times daily as needed for muscle spasms. (Patient not taking: Reported on 12/25/2017) 30 tablet 0  . furosemide (LASIX) 40 MG tablet Take 40 mg by mouth daily.    . hydrALAZINE (APRESOLINE) 10 MG tablet Take 10 mg by mouth daily.    . insulin NPH-regular Human (HUMULIN 70/30) (70-30) 100 UNIT/ML injection Inject 0-10 Units into the skin 2 (two) times daily. Patient used sliding scale    . meloxicam (MOBIC) 7.5 MG tablet Take 1 tablet (7.5 mg total) by mouth daily as needed for pain. (Patient not taking: Reported on 12/25/2017) 30 tablet 0   No current facility-administered medications for this visit.     NKDA   REVIEW OF SYSTEMS (negative unless checked):   Cardiac:  []  Chest pain or chest pressure? [x]  Shortness of breath upon activity? [x]  Shortness of breath when lying flat? []  Irregular heart  rhythm?  Vascular:  [x]  Pain in calf, thigh, or hip brought on by walking? []  Pain in feet at night that wakes you up from your sleep? []  Blood clot in your veins? [x]  Leg swelling?  Pulmonary:  []  Oxygen at home? []  Productive cough? []  Wheezing?  Neurologic:  []  Sudden weakness in arms or legs? [x]  Sudden numbness in arms or legs? []  Sudden onset of difficult speaking or slurred speech? []  Temporary loss of vision in one eye? [x]  Problems with dizziness?  Gastrointestinal:  []  Blood in stool? []  Vomited blood?  Genitourinary:  []  Burning when urinating? []  Blood in urine? [x]   End stage renal disease-HD: M/W/F   Psychiatric:  []  Major depression  Hematologic:  []  Bleeding problems? []  Problems with blood clotting?  Dermatologic:  []  Rashes or ulcers?  Constitutional:  []  Fever or chills?  Ear/Nose/Throat:  []  Change in hearing? []  Nose bleeds? []  Sore throat?  Musculoskeletal:  []  Back pain? []  Joint pain? []  Muscle pain?   Physical Examination     Vitals:   12/25/17 1431  BP: 136/74  Pulse: 74  Resp: 18  Temp: 97.6 F (36.4 C)  TempSrc: Oral  SpO2: 100%  Weight: 201 lb (91.2 kg)  Height: 5\' 7"  (1.702 m)   Body mass index is 31.48 kg/m.  General Alert, O x 3, WD, NAD  [place]/AT,    Ear/Nose/ Throat Hearing grossly intact, nares without erythema or drainage, oropharynx without Erythema or Exudate, Mallampati score: 3,   Eyes PERRLA, EOMI,    Neck Supple, mid-line trachea,    Pulmonary Sym exp, good B air movt, CTA B  Cardiac RRR, Nl S1, S2, no Murmurs, No rubs, No S3,S4  Vascular Palpable R radial and brachial, large PSA in mid-segment (essentially contained rupture), cannulation scars distal PSA  Gastro- intestinal soft, non-distended, non-tender to palpation, No guarding or rebound, no HSM, no masses, no CVAT B, No palpable prominent aortic pulse,    Musculo- skeletal M/S 5/5 throughout  , Extremities without ischemic changes  ,  No edema present, No visible varicosities , No Lipodermatosclerosis present  Neurologic Cranial nerves 2-12 intact , Pain and light touch intact in extremities , Motor exam as listed above  Psychiatric Judgement intact, Mood & affect appropriate for pt's clinical situation  Dermatologic See M/S exam for extremity exam, No rashes otherwise noted  Lymphatic  Palpable lymph nodes: None    Medical Decision Making   Aniruddh Ciavarella is a 57 y.o. male  who presents with end stage renal disease requiring hemodialysis, large RUA AVF PSA at risk for rupture   I would proceed with plication of RUA AVF PSA. Risk, benefits, and alternatives to access surgery were discussed.   The patient is aware the risks include but are not limited to: bleeding, infection, steal syndrome, nerve damage, ischemic monomelic neuropathy, thrombosis, failure to mature, complications related to venous hypertension, need for additional procedures, death and stroke.   The patient agrees to proceed forward with the procedure.  Due to his HD cycle, likely another partner will have to complete his procedure.   Adele Barthel, MD, FACS [facility] Office: [phone] Pager: [phone]  12/25/2017, 3:34 PM

## 2017-12-25 NOTE — H&P (View-Only) (Signed)
Requested by:  Fran Lowes, MD [address] [place], [place] [address]  Reason for consultation: thinning R arm fistula   History of Present Illness   Alejandro Lee is a 57 y.o. (May 25, 1961) male who presents for cc: thin area in R arm fistula.  Pt denies any steal sx.  He notes he has had multiple bleeding episodes in the R arm that resolve with compression.  Previously the R AVF was cannulated with a button hole technique.  They have stopped doing and avoid cannulating the aneurysmal segment.  Past Medical History:  Diagnosis Date  . CHF (congestive heart failure) ([place])   . Chronic kidney disease   . Diabetes mellitus without complication ([place])   . Hyperlipidemia   . Hypertension     Past Surgical History:  Procedure Laterality Date  . AV FISTULA PLACEMENT Right   . EYE SURGERY    . lens replacement Left   . REFRACTIVE SURGERY Bilateral     Social History   Socioeconomic History  . Marital status: Significant Other    Spouse name: Not on file  . Number of children: Not on file  . Years of education: Not on file  . Highest education level: Not on file  Occupational History  . Not on file  Social Needs  . Financial resource strain: Very hard  . Food insecurity:    Worry: Often true    Inability: Often true  . Transportation needs:    Medical: No    Non-medical: No  Tobacco Use  . Smoking status: Former Research scientist (life sciences)  . Smokeless tobacco: Never Used  Substance and Sexual Activity  . Alcohol use: Not Currently  . Drug use: Never  . Sexual activity: Yes  Lifestyle  . Physical activity:    Days per week: 7 days    Minutes per session: 10 min  . Stress: Very much  Relationships  . Social connections:    Talks on phone: More than three times a week    Gets together: Never    Attends religious service: Never    Active member of club or organization: No    Attends meetings of clubs or organizations: Never    Relationship status: Living with partner  .  Intimate partner violence:    Fear of current or ex partner: No    Emotionally abused: No    Physically abused: No    Forced sexual activity: No  Other Topics Concern  . Not on file  Social History Narrative  . Not on file    Family History  Problem Relation Age of Onset  . Diabetes Mother   . Hypertension Mother   . Diabetes Father   . Heart disease Father   . Cancer Father   . Hypertension Father   . Diabetes Sister   . Hypertension Sister   . Diabetes Brother   . Hypertension Brother   . Cancer Brother     Current Outpatient Medications  Medication Sig Dispense Refill  . calcium acetate (PHOSLO) 667 MG capsule Take 2,001 mg by mouth 3 (three) times daily.    . cinacalcet (SENSIPAR) 30 MG tablet Take 30 mg by mouth daily.    Marland Kitchen lisinopril (PRINIVIL,ZESTRIL) 10 MG tablet Take 10 mg by mouth daily.    Marland Kitchen amLODipine (NORVASC) 10 MG tablet Take 10 mg by mouth daily.    . cyclobenzaprine (FLEXERIL) 10 MG tablet Take 10 mg by mouth 2 (two) times daily as needed for muscle spasms (  cramping).    . cyclobenzaprine (FLEXERIL) 10 MG tablet Take 1 tablet (10 mg total) by mouth 3 (three) times daily as needed for muscle spasms. (Patient not taking: Reported on 12/25/2017) 30 tablet 0  . furosemide (LASIX) 40 MG tablet Take 40 mg by mouth daily.    . hydrALAZINE (APRESOLINE) 10 MG tablet Take 10 mg by mouth daily.    . insulin NPH-regular Human (HUMULIN 70/30) (70-30) 100 UNIT/ML injection Inject 0-10 Units into the skin 2 (two) times daily. Patient used sliding scale    . meloxicam (MOBIC) 7.5 MG tablet Take 1 tablet (7.5 mg total) by mouth daily as needed for pain. (Patient not taking: Reported on 12/25/2017) 30 tablet 0   No current facility-administered medications for this visit.     NKDA   REVIEW OF SYSTEMS (negative unless checked):   Cardiac:  []  Chest pain or chest pressure? [x]  Shortness of breath upon activity? [x]  Shortness of breath when lying flat? []  Irregular heart  rhythm?  Vascular:  [x]  Pain in calf, thigh, or hip brought on by walking? []  Pain in feet at night that wakes you up from your sleep? []  Blood clot in your veins? [x]  Leg swelling?  Pulmonary:  []  Oxygen at home? []  Productive cough? []  Wheezing?  Neurologic:  []  Sudden weakness in arms or legs? [x]  Sudden numbness in arms or legs? []  Sudden onset of difficult speaking or slurred speech? []  Temporary loss of vision in one eye? [x]  Problems with dizziness?  Gastrointestinal:  []  Blood in stool? []  Vomited blood?  Genitourinary:  []  Burning when urinating? []  Blood in urine? [x]   End stage renal disease-HD: M/W/F   Psychiatric:  []  Major depression  Hematologic:  []  Bleeding problems? []  Problems with blood clotting?  Dermatologic:  []  Rashes or ulcers?  Constitutional:  []  Fever or chills?  Ear/Nose/Throat:  []  Change in hearing? []  Nose bleeds? []  Sore throat?  Musculoskeletal:  []  Back pain? []  Joint pain? []  Muscle pain?   Physical Examination     Vitals:   12/25/17 1431  BP: 136/74  Pulse: 74  Resp: 18  Temp: 97.6 F (36.4 C)  TempSrc: Oral  SpO2: 100%  Weight: 201 lb (91.2 kg)  Height: 5\' 7"  (1.702 m)   Body mass index is 31.48 kg/m.  General Alert, O x 3, WD, NAD  Head [place]/AT,    Ear/Nose/ Throat Hearing grossly intact, nares without erythema or drainage, oropharynx without Erythema or Exudate, Mallampati score: 3,   Eyes PERRLA, EOMI,    Neck Supple, mid-line trachea,    Pulmonary Sym exp, good B air movt, CTA B  Cardiac RRR, Nl S1, S2, no Murmurs, No rubs, No S3,S4  Vascular Palpable R radial and brachial, large PSA in mid-segment (essentially contained rupture), cannulation scars distal PSA  Gastro- intestinal soft, non-distended, non-tender to palpation, No guarding or rebound, no HSM, no masses, no CVAT B, No palpable prominent aortic pulse,    Musculo- skeletal M/S 5/5 throughout  , Extremities without ischemic changes  ,  No edema present, No visible varicosities , No Lipodermatosclerosis present  Neurologic Cranial nerves 2-12 intact , Pain and light touch intact in extremities , Motor exam as listed above  Psychiatric Judgement intact, Mood & affect appropriate for pt's clinical situation  Dermatologic See M/S exam for extremity exam, No rashes otherwise noted  Lymphatic  Palpable lymph nodes: None    Medical Decision Making   Blane Worthington is a 57 y.o. male  who presents with end stage renal disease requiring hemodialysis, large RUA AVF PSA at risk for rupture   I would proceed with plication of RUA AVF PSA. Risk, benefits, and alternatives to access surgery were discussed.   The patient is aware the risks include but are not limited to: bleeding, infection, steal syndrome, nerve damage, ischemic monomelic neuropathy, thrombosis, failure to mature, complications related to venous hypertension, need for additional procedures, death and stroke.   The patient agrees to proceed forward with the procedure.  Due to his HD cycle, likely another partner will have to complete his procedure.   Adele Barthel, MD, FACS [facility] Office: [phone] Pager: [phone]  12/25/2017, 3:34 PM

## 2017-12-27 DIAGNOSIS — N2581 Secondary hyperparathyroidism of renal origin: Secondary | ICD-10-CM | POA: Diagnosis not present

## 2017-12-27 DIAGNOSIS — Z992 Dependence on renal dialysis: Secondary | ICD-10-CM | POA: Diagnosis not present

## 2017-12-27 DIAGNOSIS — D509 Iron deficiency anemia, unspecified: Secondary | ICD-10-CM | POA: Diagnosis not present

## 2017-12-27 DIAGNOSIS — N186 End stage renal disease: Secondary | ICD-10-CM | POA: Diagnosis not present

## 2017-12-27 DIAGNOSIS — D631 Anemia in chronic kidney disease: Secondary | ICD-10-CM | POA: Diagnosis not present

## 2017-12-30 DIAGNOSIS — N2581 Secondary hyperparathyroidism of renal origin: Secondary | ICD-10-CM | POA: Diagnosis not present

## 2017-12-30 DIAGNOSIS — D631 Anemia in chronic kidney disease: Secondary | ICD-10-CM | POA: Diagnosis not present

## 2017-12-30 DIAGNOSIS — N186 End stage renal disease: Secondary | ICD-10-CM | POA: Diagnosis not present

## 2017-12-30 DIAGNOSIS — D509 Iron deficiency anemia, unspecified: Secondary | ICD-10-CM | POA: Diagnosis not present

## 2017-12-30 DIAGNOSIS — Z992 Dependence on renal dialysis: Secondary | ICD-10-CM | POA: Diagnosis not present

## 2018-01-01 DIAGNOSIS — D631 Anemia in chronic kidney disease: Secondary | ICD-10-CM | POA: Diagnosis not present

## 2018-01-01 DIAGNOSIS — N186 End stage renal disease: Secondary | ICD-10-CM | POA: Diagnosis not present

## 2018-01-01 DIAGNOSIS — D509 Iron deficiency anemia, unspecified: Secondary | ICD-10-CM | POA: Diagnosis not present

## 2018-01-01 DIAGNOSIS — Z992 Dependence on renal dialysis: Secondary | ICD-10-CM | POA: Diagnosis not present

## 2018-01-01 DIAGNOSIS — N2581 Secondary hyperparathyroidism of renal origin: Secondary | ICD-10-CM | POA: Diagnosis not present

## 2018-01-02 ENCOUNTER — Ambulatory Visit (INDEPENDENT_AMBULATORY_CARE_PROVIDER_SITE_OTHER): Payer: Medicare Other | Admitting: Orthopaedic Surgery

## 2018-01-02 ENCOUNTER — Encounter (INDEPENDENT_AMBULATORY_CARE_PROVIDER_SITE_OTHER): Payer: Self-pay | Admitting: Orthopaedic Surgery

## 2018-01-02 VITALS — BP 137/85 | HR 87 | Ht 67.0 in | Wt 201.0 lb

## 2018-01-02 DIAGNOSIS — M4810 Ankylosing hyperostosis [Forestier], site unspecified: Secondary | ICD-10-CM

## 2018-01-02 NOTE — Progress Notes (Addendum)
Office Visit Note/orthopedic consultation requested by Dr. Mannie Stabile   Patient: Alejandro Lee           Date of Birth: 1961/03/15           MRN: [id number] Visit Date: 01/02/2018              Requested by: [name], [address] [place], [place] [id number] PCP: Patient, No Pcp Per   Assessment & Plan: Visit Diagnoses:  1. DISH (diffuse idiopathic skeletal hyperostosis)     Plan: We reviewed imaging studies with patient.  Recommend we work on a stretching program to maintain flexibility.  Currently has no radicular or neurogenic claudication symptoms.  We discussed best to avoid narcotic medication.  Thank you for the opportunity to see him in consultation.  Office follow-up PRN.  Follow-Up Instructions: No follow-ups on file.   Orders:  No orders of the defined types were placed in this encounter.  No orders of the defined types were placed in this encounter.     Procedures: No procedures performed   Clinical Data: No additional findings.   Subjective: Chief Complaint  Patient presents with  . Neck - Pain  . Middle Back - Pain  . Lower Back - Pain    HPI 57 year old male on renal dialysis recently moved from [place] down to work on area.  He is here at the request of Dr. Mannie Stabile for orthopedic consultation concerning chronic thoracic and low back pain.  He states he is awaiting a kidney transplant with [facility].  He has had back pain that radiates from his neck down to his lower back.  He has had x-rays as well as CT scans done in April available for review.  Pain does not radiate into his arms or legs he took a prednisone taper and states he got good relief from this.  He is used Lodine also Flexeril in the past.  He has diabetes but currently is not on insulin.  He is noticed stiffness in his back decreased excursion.  He denies neurogenic claudication symptoms no chills or fever.  Review of Systems patient has a dialysis fistula is had  problems with bleeding with this in the past.  Spondylosis with aching in his back.  Past history of congestive heart failure.  ESRD on dialysis.  Previous pseudoaneurysm at AV hemodialysis fistula site.  Type 2 diabetes.  Positive for acid reflux bladder problems, cataracts, glaucoma, hypertension, sleep apnea.  Otherwise negative as it pertains HPI 14 point review of systems updated.   Objective: Vital Signs: BP 137/85   Pulse 87   Ht 5\' 7"  (1.702 m)   Wt 201 lb (91.2 kg)   BMI 31.48 kg/m   Physical Exam  Constitutional: He is oriented to person, place, and time. He appears well-developed and well-nourished.  HENT:  Head: Normocephalic and atraumatic.  Eyes: Pupils are equal, round, and reactive to light. EOM are normal.  Neck: No tracheal deviation present. No thyromegaly present.  Cardiovascular: Normal rate.  Pulmonary/Chest: Effort normal. He has no wheezes.  Abdominal: Soft. Bowel sounds are normal.  Neurological: He is alert and oriented to person, place, and time.  Skin: Skin is warm and dry. Capillary refill takes less than 2 seconds.  Psychiatric: He has a normal mood and affect. His behavior is normal. Judgment and thought content normal.    Ortho Exam dialysis fistula upper extremity.  Patient has decreased lumbar and thoracic excursion.  Lower extremity reflexes are 2+ and symmetrical anterior tib gastrocsoleus is intact.  He has slight edema lower extremities.  Specialty Comments:  No specialty comments available.  Imaging: CT scan thoracic spine lumbar spine demonstrates DISH with flowing anterior osteophytes T3 2 through T12.  No acute osseous abnormalities of thoracic or lumbar spine.   PMFS History: Patient Active Problem List   Diagnosis Date Noted  . 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[place]) 12/25/2017  . Type 2 diabetes mellitus with other specified complication ([place]) 32/54/9826  . ESRD (end stage renal disease) on dialysis ([place]) 12/19/2017  .  Chronic right-sided low back pain without sciatica 12/19/2017   Past Medical History:  Diagnosis Date  . CHF (congestive heart failure) ([place])   . Chronic kidney disease    dialysis M/W/F  . Diabetes mellitus without complication ([place])   . Hyperlipidemia   . Hypertension     Family History  Problem Relation Age of Onset  . Diabetes Mother   . Hypertension Mother   . Diabetes Father   . Heart disease Father   . Cancer Father   . Hypertension Father   . Diabetes Sister   . Hypertension Sister   . Diabetes Brother   . Hypertension Brother   . Cancer Brother     Past Surgical History:  Procedure Laterality Date  . AV FISTULA PLACEMENT Right   . EYE SURGERY    . lens replacement Left   . REFRACTIVE SURGERY Bilateral    Social History   Occupational History  . Not on file  Tobacco Use  . Smoking status: Former Research scientist (life sciences)  . Smokeless tobacco: Never Used  Substance and Sexual Activity  . Alcohol use: Not Currently  . Drug use: Never  . Sexual activity: Yes

## 2018-01-03 ENCOUNTER — Encounter (HOSPITAL_COMMUNITY): Payer: Self-pay | Admitting: *Deleted

## 2018-01-03 DIAGNOSIS — N186 End stage renal disease: Secondary | ICD-10-CM | POA: Diagnosis not present

## 2018-01-03 DIAGNOSIS — D631 Anemia in chronic kidney disease: Secondary | ICD-10-CM | POA: Diagnosis not present

## 2018-01-03 DIAGNOSIS — N2581 Secondary hyperparathyroidism of renal origin: Secondary | ICD-10-CM | POA: Diagnosis not present

## 2018-01-03 DIAGNOSIS — D509 Iron deficiency anemia, unspecified: Secondary | ICD-10-CM | POA: Diagnosis not present

## 2018-01-03 DIAGNOSIS — Z992 Dependence on renal dialysis: Secondary | ICD-10-CM | POA: Diagnosis not present

## 2018-01-03 NOTE — Progress Notes (Signed)
Attempted to call pt several time for pre-op call. Could not reach him. Left pre-op instructions on pt's voicemail

## 2018-01-03 NOTE — Progress Notes (Signed)
Anesthesia Chart Review:  Pt is a same day work up   Case:  [id number] Date/Time:  01/07/18 0350   Procedure:  PLICATION OF ARTERIOVENOUS FISTULA RIGHT ARM (Right )   Anesthesia type:  Monitor Anesthesia Care   Pre-op diagnosis:  COMPLICATION WITH FISTULA RIGHT ARM   Location:  [facility] OR ROOM 12 / [place] OR   Surgeon:  Conrad Central High, MD      DISCUSSION:  - Pt is a 57 year old male with hx CHF, HTN, DM, ESRD on hemodialysis  - Being evaluated for kidney transplant at [facility] (notes in care everywhere)    PROVIDERS: - PCP is [name], MD. Initial visit to establish care 12/19/17   LABS: Will be obtained day of surgery    IMAGES:  CXR 09/03/17 (care everywhere): no evidence of acute cardiac or pulmonary abnormality   EKG 122/19 [facility]): Sinus rhythm. LAD. Prolonged QT interval, consider electrolyte imbalance or drug effects    CV:  Echo 12/05/17 (care everywhere):  - LV size is normal. There is mild concentric LVH. LV systolic function is normal. LV ejection fraction = 55-60%. - RV normal in size and function. - LA is mildly dilated. - RA is mildly dilated. - Aortic valve sclerosis. Mild aortic regurgitation. - Mild mitral regurgitation. No significant stenosis seen - Estimated right ventricular systolic pressure is 47 mmHg. - Estimated right atrial pressure is 5 mmHg.. - Mild to moderate pulmonary hypertension. - There is no pericardial effusion.  Stress echo 12/05/17 (care everywhere):  - The patient had no chest pain during stress. The patient achieved 93 % of maximum predicted heart rate. - Negative stress ECG for inducible ischemia at target heart rate. - Normal left ventricular function at rest. There were no segmental wall motion  abnormalities at rest The estimated LV ejection fraction is 55-60% . - Normal left ventricular function and global wall motion with stress. Global LV function is preserved with stress. There were no segmental wall motion  abnormalities with  dobutamine. The estimated LV ejection fraction is 65-70% with stress. - Negative dobutamine echocardiography for inducible ischemia at target heart rate.   Past Medical History:  Diagnosis Date  . CHF (congestive heart failure) ([place])   . Chronic kidney disease    dialysis M/W/F  . Diabetes mellitus without complication ([place])   . Hyperlipidemia   . Hypertension     Past Surgical History:  Procedure Laterality Date  . AV FISTULA PLACEMENT Right   . EYE SURGERY    . lens replacement Left   . REFRACTIVE SURGERY Bilateral     MEDICATIONS: No current facility-administered medications for this encounter.    Marland Kitchen acetaminophen (TYLENOL) 325 MG tablet  . amLODipine (NORVASC) 5 MG tablet  . aspirin-acetaminophen-caffeine (EXCEDRIN MIGRAINE) 250-250-65 MG tablet  . bismuth subsalicylate (PEPTO BISMOL) 262 MG/15ML suspension  . calcium acetate (PHOSLO) 667 MG capsule  . cinacalcet (SENSIPAR) 30 MG tablet  . cyclobenzaprine (FLEXERIL) 10 MG tablet  . etodolac (LODINE) 400 MG tablet  . lidocaine-prilocaine (EMLA) cream  . magnesium hydroxide (MILK OF MAGNESIA) 400 MG/5ML suspension  . meloxicam (MOBIC) 7.5 MG tablet    If labs acceptable day of surgery, I anticipate pt can proceed with surgery as scheduled.  Willeen Cass, FNP-BC [facility]/Anesthesiology Phone: [phone] 01/03/2018 1:18 PM

## 2018-01-06 DIAGNOSIS — N2581 Secondary hyperparathyroidism of renal origin: Secondary | ICD-10-CM | POA: Diagnosis not present

## 2018-01-06 DIAGNOSIS — Z992 Dependence on renal dialysis: Secondary | ICD-10-CM | POA: Diagnosis not present

## 2018-01-06 DIAGNOSIS — D631 Anemia in chronic kidney disease: Secondary | ICD-10-CM | POA: Diagnosis not present

## 2018-01-06 DIAGNOSIS — N186 End stage renal disease: Secondary | ICD-10-CM | POA: Diagnosis not present

## 2018-01-06 DIAGNOSIS — D509 Iron deficiency anemia, unspecified: Secondary | ICD-10-CM | POA: Diagnosis not present

## 2018-01-07 ENCOUNTER — Other Ambulatory Visit: Payer: Self-pay

## 2018-01-07 ENCOUNTER — Ambulatory Visit (HOSPITAL_COMMUNITY)
Admission: RE | Admit: 2018-01-07 | Discharge: 2018-01-07 | Disposition: A | Discharge status: Home / Self Care | Payer: Medicare Other | Source: Ambulatory Visit | Attending: Vascular Surgery | Admitting: Vascular Surgery

## 2018-01-07 ENCOUNTER — Encounter (HOSPITAL_COMMUNITY): Payer: Self-pay

## 2018-01-07 ENCOUNTER — Encounter (INDEPENDENT_AMBULATORY_CARE_PROVIDER_SITE_OTHER): Payer: Self-pay | Admitting: Orthopaedic Surgery

## 2018-01-07 ENCOUNTER — Ambulatory Visit (HOSPITAL_COMMUNITY): Payer: Medicare Other | Admitting: Emergency Medicine

## 2018-01-07 ENCOUNTER — Encounter (HOSPITAL_COMMUNITY)
Admission: RE | Disposition: A | Discharge status: Home / Self Care | Payer: Self-pay | Source: Ambulatory Visit | Attending: Vascular Surgery

## 2018-01-07 DIAGNOSIS — T82898A Other specified complication of vascular prosthetic devices, implants and grafts, initial encounter: Secondary | ICD-10-CM | POA: Diagnosis not present

## 2018-01-07 DIAGNOSIS — E1122 Type 2 diabetes mellitus with diabetic chronic kidney disease: Secondary | ICD-10-CM | POA: Diagnosis not present

## 2018-01-07 DIAGNOSIS — Z794 Long term (current) use of insulin: Secondary | ICD-10-CM | POA: Insufficient documentation

## 2018-01-07 DIAGNOSIS — I120 Hypertensive chronic kidney disease with stage 5 chronic kidney disease or end stage renal disease: Secondary | ICD-10-CM | POA: Diagnosis not present

## 2018-01-07 DIAGNOSIS — Z79899 Other long term (current) drug therapy: Secondary | ICD-10-CM | POA: Insufficient documentation

## 2018-01-07 DIAGNOSIS — I509 Heart failure, unspecified: Secondary | ICD-10-CM | POA: Insufficient documentation

## 2018-01-07 DIAGNOSIS — N186 End stage renal disease: Secondary | ICD-10-CM | POA: Insufficient documentation

## 2018-01-07 DIAGNOSIS — Z87891 Personal history of nicotine dependence: Secondary | ICD-10-CM | POA: Insufficient documentation

## 2018-01-07 DIAGNOSIS — I132 Hypertensive heart and chronic kidney disease with heart failure and with stage 5 chronic kidney disease, or end stage renal disease: Secondary | ICD-10-CM | POA: Diagnosis not present

## 2018-01-07 DIAGNOSIS — T829XXA Unspecified complication of cardiac and vascular prosthetic device, implant and graft, initial encounter: Secondary | ICD-10-CM | POA: Diagnosis not present

## 2018-01-07 DIAGNOSIS — Z992 Dependence on renal dialysis: Secondary | ICD-10-CM | POA: Insufficient documentation

## 2018-01-07 HISTORY — DX: Headache, unspecified: R51.9

## 2018-01-07 HISTORY — DX: Acute myocardial infarction, unspecified: I21.9

## 2018-01-07 HISTORY — DX: Unspecified osteoarthritis, unspecified site: M19.90

## 2018-01-07 HISTORY — DX: Headache: R51

## 2018-01-07 HISTORY — PX: FISTULA SUPERFICIALIZATION: SHX6341

## 2018-01-07 HISTORY — DX: Gastro-esophageal reflux disease without esophagitis: K21.9

## 2018-01-07 LAB — POCT I-STAT, CHEM 8
BUN: 45 mg/dL — AB (ref 6–20)
CALCIUM ION: 1.14 mmol/L — AB (ref 1.15–1.40)
CHLORIDE: 100 mmol/L — AB (ref 101–111)
CREATININE: 11.9 mg/dL — AB (ref 0.61–1.24)
GLUCOSE: 131 mg/dL — AB (ref 65–99)
HCT: 44 % (ref 39.0–52.0)
Hemoglobin: 15 g/dL (ref 13.0–17.0)
Potassium: 4 mmol/L (ref 3.5–5.1)
SODIUM: 140 mmol/L (ref 135–145)
TCO2: 30 mmol/L (ref 22–32)

## 2018-01-07 LAB — GLUCOSE, CAPILLARY
GLUCOSE-CAPILLARY: 139 mg/dL — AB (ref 65–99)
Glucose-Capillary: 96 mg/dL (ref 65–99)

## 2018-01-07 SURGERY — FISTULA SUPERFICIALIZATION
Anesthesia: General | Laterality: Right

## 2018-01-07 MED ORDER — PHENYLEPHRINE 40 MCG/ML (10ML) SYRINGE FOR IV PUSH (FOR BLOOD PRESSURE SUPPORT)
PREFILLED_SYRINGE | INTRAVENOUS | Status: DC | PRN
  Administered 2018-01-07: 40 ug via INTRAVENOUS

## 2018-01-07 MED ORDER — HYDROMORPHONE HCL 2 MG/ML IJ SOLN: 0.3000 mg | INTRAMUSCULAR | Status: DC | PRN

## 2018-01-07 MED ORDER — OXYCODONE HCL 5 MG PO TABS: 5.0000 mg | ORAL_TABLET | Freq: Once | ORAL | Status: DC | PRN

## 2018-01-07 MED ORDER — DEXAMETHASONE SODIUM PHOSPHATE 10 MG/ML IJ SOLN
INTRAMUSCULAR | Status: DC | PRN
  Administered 2018-01-07: 5 mg via INTRAVENOUS

## 2018-01-07 MED ORDER — CHLORHEXIDINE GLUCONATE 4 % EX LIQD: 60.0000 mL | Freq: Once | CUTANEOUS | Status: DC

## 2018-01-07 MED ORDER — HEPARIN SODIUM (PORCINE) 1000 UNIT/ML IJ SOLN
INTRAMUSCULAR | Status: DC | PRN
  Administered 2018-01-07: 9000 [IU] via INTRAVENOUS

## 2018-01-07 MED ORDER — HEMOSTATIC AGENTS (NO CHARGE) OPTIME
TOPICAL | Status: DC | PRN
  Administered 2018-01-07: 1 via TOPICAL

## 2018-01-07 MED ORDER — PROPOFOL 1000 MG/100ML IV EMUL
INTRAVENOUS | Status: AC
  Filled 2018-01-07: qty 100

## 2018-01-07 MED ORDER — PROMETHAZINE HCL 25 MG/ML IJ SOLN: 6.2500 mg | INTRAMUSCULAR | Status: DC | PRN

## 2018-01-07 MED ORDER — LIDOCAINE 2% (20 MG/ML) 5 ML SYRINGE
INTRAMUSCULAR | Status: DC | PRN
  Administered 2018-01-07: 60 mg via INTRAVENOUS

## 2018-01-07 MED ORDER — ONDANSETRON HCL 4 MG/2ML IJ SOLN
INTRAMUSCULAR | Status: AC
  Filled 2018-01-07: qty 2

## 2018-01-07 MED ORDER — DEXAMETHASONE SODIUM PHOSPHATE 10 MG/ML IJ SOLN
INTRAMUSCULAR | Status: AC
  Filled 2018-01-07: qty 1

## 2018-01-07 MED ORDER — PROTAMINE SULFATE 10 MG/ML IV SOLN
INTRAVENOUS | Status: AC
  Filled 2018-01-07: qty 5

## 2018-01-07 MED ORDER — FENTANYL CITRATE (PF) 250 MCG/5ML IJ SOLN
INTRAMUSCULAR | Status: AC
Start: 2018-01-07 — End: ?
  Filled 2018-01-07: qty 5

## 2018-01-07 MED ORDER — OXYCODONE HCL 5 MG/5ML PO SOLN: 5.0000 mg | Freq: Once | ORAL | Status: DC | PRN

## 2018-01-07 MED ORDER — HEPARIN SODIUM (PORCINE) 5000 UNIT/ML IJ SOLN
INTRAMUSCULAR | Status: AC
  Filled 2018-01-07: qty 1.2

## 2018-01-07 MED ORDER — MIDAZOLAM HCL 5 MG/5ML IJ SOLN
INTRAMUSCULAR | Status: DC | PRN
  Administered 2018-01-07 (×2): 1 mg via INTRAVENOUS

## 2018-01-07 MED ORDER — 0.9 % SODIUM CHLORIDE (POUR BTL) OPTIME
TOPICAL | Status: DC | PRN
  Administered 2018-01-07: 1000 mL

## 2018-01-07 MED ORDER — MIDAZOLAM HCL 2 MG/2ML IJ SOLN
INTRAMUSCULAR | Status: AC
  Filled 2018-01-07: qty 2

## 2018-01-07 MED ORDER — CEFAZOLIN SODIUM-DEXTROSE 2-4 GM/100ML-% IV SOLN
2.0000 g | INTRAVENOUS | Status: AC
  Administered 2018-01-07: 2 g via INTRAVENOUS
  Filled 2018-01-07: qty 100

## 2018-01-07 MED ORDER — PROPOFOL 10 MG/ML IV BOLUS
INTRAVENOUS | Status: DC | PRN
  Administered 2018-01-07: 160 mg via INTRAVENOUS

## 2018-01-07 MED ORDER — SODIUM CHLORIDE 0.9 % IV SOLN
INTRAVENOUS | Status: DC
  Administered 2018-01-07 (×2): via INTRAVENOUS

## 2018-01-07 MED ORDER — HYDROCODONE-ACETAMINOPHEN 5-325 MG PO TABS: 1.0000 | ORAL_TABLET | Freq: Four times a day (QID) | ORAL | 0 refills | Status: DC | PRN

## 2018-01-07 MED ORDER — SODIUM CHLORIDE 0.9 % IV SOLN
INTRAVENOUS | Status: DC | PRN
  Administered 2018-01-07: 500 mL

## 2018-01-07 MED ORDER — FENTANYL CITRATE (PF) 100 MCG/2ML IJ SOLN
INTRAMUSCULAR | Status: DC | PRN
  Administered 2018-01-07 (×2): 25 ug via INTRAVENOUS
  Administered 2018-01-07: 50 ug via INTRAVENOUS

## 2018-01-07 MED ORDER — PROTAMINE SULFATE 10 MG/ML IV SOLN
INTRAVENOUS | Status: AC
  Filled 2018-01-07: qty 10

## 2018-01-07 MED ORDER — PROTAMINE SULFATE 10 MG/ML IV SOLN
INTRAVENOUS | Status: DC | PRN
  Administered 2018-01-07 (×6): 10 mg via INTRAVENOUS

## 2018-01-07 MED ORDER — ONDANSETRON HCL 4 MG/2ML IJ SOLN
INTRAMUSCULAR | Status: DC | PRN
  Administered 2018-01-07: 4 mg via INTRAVENOUS

## 2018-01-07 SURGICAL SUPPLY — 45 items
ARMBAND PINK RESTRICT EXTREMIT (MISCELLANEOUS) ×3 IMPLANT
BANDAGE ESMARK 6X9 LF (GAUZE/BANDAGES/DRESSINGS) ×1 IMPLANT
BNDG ESMARK 6X9 LF (GAUZE/BANDAGES/DRESSINGS) ×3
CANISTER SUCT 3000ML PPV (MISCELLANEOUS) ×3 IMPLANT
CLIP VESOCCLUDE MED 6/CT (CLIP) ×3 IMPLANT
CLIP VESOCCLUDE SM WIDE 6/CT (CLIP) ×3 IMPLANT
COVER PROBE W GEL 5X96 (DRAPES) IMPLANT
CUFF TOURNIQUET SINGLE 24IN (TOURNIQUET CUFF) ×3 IMPLANT
DECANTER SPIKE VIAL GLASS SM (MISCELLANEOUS) IMPLANT
DERMABOND ADVANCED (GAUZE/BANDAGES/DRESSINGS) ×2
DERMABOND ADVANCED .7 DNX12 (GAUZE/BANDAGES/DRESSINGS) ×1 IMPLANT
DRAIN PENROSE 1/2X12 LTX STRL (WOUND CARE) IMPLANT
DRSG COVADERM 4X6 (GAUZE/BANDAGES/DRESSINGS) ×3 IMPLANT
ELECT REM PT RETURN 9FT ADLT (ELECTROSURGICAL) ×3
ELECTRODE REM PT RTRN 9FT ADLT (ELECTROSURGICAL) ×1 IMPLANT
GLOVE BIO SURGEON STRL SZ 6.5 (GLOVE) ×2 IMPLANT
GLOVE BIO SURGEON STRL SZ7 (GLOVE) ×3 IMPLANT
GLOVE BIO SURGEONS STRL SZ 6.5 (GLOVE) ×1
GLOVE BIOGEL M STRL SZ7.5 (GLOVE) ×3 IMPLANT
GLOVE BIOGEL PI IND STRL 6.5 (GLOVE) ×1 IMPLANT
GLOVE BIOGEL PI IND STRL 7.0 (GLOVE) ×1 IMPLANT
GLOVE BIOGEL PI IND STRL 7.5 (GLOVE) ×1 IMPLANT
GLOVE BIOGEL PI INDICATOR 6.5 (GLOVE) ×2
GLOVE BIOGEL PI INDICATOR 7.0 (GLOVE) ×2
GLOVE BIOGEL PI INDICATOR 7.5 (GLOVE) ×2
GOWN STRL REUS W/ TWL LRG LVL3 (GOWN DISPOSABLE) ×2 IMPLANT
GOWN STRL REUS W/ TWL XL LVL3 (GOWN DISPOSABLE) ×1 IMPLANT
GOWN STRL REUS W/TWL LRG LVL3 (GOWN DISPOSABLE) ×4
GOWN STRL REUS W/TWL XL LVL3 (GOWN DISPOSABLE) ×2
HEMOSTAT SPONGE AVITENE ULTRA (HEMOSTASIS) ×3 IMPLANT
KIT BASIN OR (CUSTOM PROCEDURE TRAY) ×3 IMPLANT
KIT TURNOVER KIT B (KITS) ×3 IMPLANT
NS IRRIG 1000ML POUR BTL (IV SOLUTION) ×3 IMPLANT
PACK CV ACCESS (CUSTOM PROCEDURE TRAY) ×3 IMPLANT
PAD ARMBOARD 7.5X6 YLW CONV (MISCELLANEOUS) ×6 IMPLANT
STAPLER VISISTAT 35W (STAPLE) ×3 IMPLANT
SUT MNCRL AB 4-0 PS2 18 (SUTURE) ×3 IMPLANT
SUT PROLENE 5 0 C 1 24 (SUTURE) ×6 IMPLANT
SUT PROLENE 6 0 BV (SUTURE) ×3 IMPLANT
SUT PROLENE 7 0 BV 1 (SUTURE) ×3 IMPLANT
SUT VIC AB 3-0 SH 27 (SUTURE) ×2
SUT VIC AB 3-0 SH 27X BRD (SUTURE) ×1 IMPLANT
TOWEL GREEN STERILE (TOWEL DISPOSABLE) ×3 IMPLANT
UNDERPAD 30X30 (UNDERPADS AND DIAPERS) ×3 IMPLANT
WATER STERILE IRR 1000ML POUR (IV SOLUTION) ×3 IMPLANT

## 2018-01-07 NOTE — Interval H&P Note (Signed)
History and Physical Interval Note:  01/07/2018 11:19 AM  Alejandro Lee  has presented today for surgery, with the diagnosis of COMPLICATION WITH FISTULA RIGHT ARM  The various methods of treatment have been discussed with the patient and family. After consideration of risks, benefits and other options for treatment, the patient has consented to  Procedure(s): PLICATION OF ARTERIOVENOUS FISTULA RIGHT ARM (Right) as a surgical intervention .  The patient's history has been reviewed, patient examined, no change in status, stable for surgery.  I have reviewed the patient's chart and labs.  Questions were answered to the patient's satisfaction.     Adele Barthel

## 2018-01-07 NOTE — Anesthesia Preprocedure Evaluation (Signed)
Anesthesia Evaluation  Patient identified by MRN, date of birth, ID band Patient awake    Reviewed: Allergy & Precautions, NPO status , Patient's Chart, lab work & pertinent test results  Airway Mallampati: II  TM Distance: >3 FB Neck ROM: Full    Dental no notable dental hx.    Pulmonary neg pulmonary ROS, former smoker,    Pulmonary exam normal breath sounds clear to auscultation       Cardiovascular hypertension, negative cardio ROS Normal cardiovascular exam Rhythm:Regular Rate:Normal     Neuro/Psych negative neurological ROS  negative psych ROS   GI/Hepatic negative GI ROS, Neg liver ROS, GERD  ,  Endo/Other  negative endocrine ROSdiabetes  Renal/GU ESRF and DialysisRenal diseasenegative Renal ROS  negative genitourinary   Musculoskeletal negative musculoskeletal ROS (+)   Abdominal   Peds negative pediatric ROS (+)  Hematology negative hematology ROS (+)   Anesthesia Other Findings   Reproductive/Obstetrics negative OB ROS                             Anesthesia Physical Anesthesia Plan  ASA: IV  Anesthesia Plan: General   Post-op Pain Management:    Induction: Intravenous  PONV Risk Score and Plan: 2 and Ondansetron and Midazolam  Airway Management Planned: LMA  Additional Equipment:   Intra-op Plan:   Post-operative Plan: Extubation in OR  Informed Consent: I have reviewed the patients History and Physical, chart, labs and discussed the procedure including the risks, benefits and alternatives for the proposed anesthesia with the patient or authorized representative who has indicated his/her understanding and acceptance.   Dental advisory given  Plan Discussed with: CRNA  Anesthesia Plan Comments:         Anesthesia Quick Evaluation

## 2018-01-07 NOTE — Progress Notes (Signed)
Patient's BP 187/88, rechecked BP 165/92.  Dr. Sabra Heck made aware.  No new orders received.  Will continue to monitor patient.

## 2018-01-07 NOTE — Progress Notes (Signed)
   01/07/18 1117  OBSTRUCTIVE SLEEP APNEA  Have you ever been diagnosed with sleep apnea through a sleep study? No  Do you snore loudly (loud enough to be heard through closed doors)?  1  Do you often feel tired, fatigued, or sleepy during the daytime (such as falling asleep during driving or talking to someone)? 0  Has anyone observed you stop breathing during your sleep? 1  Do you have, or are you being treated for high blood pressure? 1  BMI more than 35 kg/m2? 0  Age > 50 (1-yes) 1  Neck circumference greater than:Male 16 inches or larger, Male 17inches or larger? 0  Male Gender (Yes=1) 1  Obstructive Sleep Apnea Score 5  Score 5 or greater  Results sent to PCP

## 2018-01-07 NOTE — Transfer of Care (Signed)
Immediate Anesthesia Transfer of Care Note  Patient: Alejandro Lee  Procedure(s) Performed: PLICATION OF ARTERIOVENOUS FISTULA RIGHT ARM (Right )  Patient Location: PACU  Anesthesia Type:General  Level of Consciousness: awake and drowsy  Airway & Oxygen Therapy: Patient Spontanous Breathing and Patient connected to nasal cannula oxygen  Post-op Assessment: Report given to RN and Post -op Vital signs reviewed and stable  Post vital signs: Reviewed and stable  Last Vitals:  Vitals Value Taken Time  BP 151/80 01/07/2018  3:07 PM  Temp    Pulse 83 01/07/2018  3:08 PM  Resp 26 01/07/2018  3:08 PM  SpO2 100 % 01/07/2018  3:08 PM  Vitals shown include unvalidated device data.  Last Pain:  Vitals:   01/07/18 1119  TempSrc:   PainSc: 0-No pain         Complications: No apparent anesthesia complications

## 2018-01-07 NOTE — Anesthesia Procedure Notes (Signed)
Procedure Name: LMA Insertion Date/Time: 01/07/2018 1:51 PM Performed by: Freddie Breech, CRNA Pre-anesthesia Checklist: Patient identified, Emergency Drugs available, Suction available, Patient being monitored and Timeout performed Patient Re-evaluated:Patient Re-evaluated prior to induction Oxygen Delivery Method: Circle system utilized Preoxygenation: Pre-oxygenation with 100% oxygen Induction Type: IV induction LMA: LMA inserted LMA Size: 4.0 Number of attempts: 1 Placement Confirmation: positive ETCO2 and breath sounds checked- equal and bilateral Tube secured with: Tape Dental Injury: Teeth and Oropharynx as per pre-operative assessment  Comments: LMA inserted by Garner Gavel SRNA under direct supervision

## 2018-01-07 NOTE — Op Note (Signed)
    OPERATIVE NOTE   PROCEDURE: 1. Plication of right arteriovenous fistula   PRE-OPERATIVE DIAGNOSIS: Pseudoaneurysm degeneration of arteriovenous fistula   POST-OPERATIVE DIAGNOSIS: same as above   SURGEON: Adele Barthel, MD  ASSISTANT(S): Dagoberto Ligas, PAC   ANESTHESIA: general  ESTIMATED BLOOD LOSS: 50 cc  FINDING(S): 1. Large pseudoaneurysm with chronic mural thrombus 2. Augmented thrill at end of the case  SPECIMEN(S):  none  INDICATIONS:   Alejandro Lee is a 57 y.o. 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  In order to salvage the fistula and decrease the bleeding complication risks, I recommended plication of the fistula.  Risk, benefits, and alternatives to access surgery were discussed.  The patient is aware the risks include but are not limited to: bleeding, infection, steal syndrome, nerve damage, ischemic monomelic neuropathy, failure to mature, need for additional procedures, thrombosis of the access, death and stroke.  The patient agrees to proceed forward with the procedure.   DESCRIPTION: After obtaining full informed written consent, the patient was brought back to the operating room and placed supine upon the operating table.  The patient received IV antibiotics prior to induction.  After obtaining adequate anesthesia, the patient was prepped and draped in the standard fashion for: right access procedure.  The patient was given 9000 units of Heparin intravenously, which was a therapeutic bolus.  After waiting 5 minutes, a sterile tourniquet was applied to the upper arm and inflated to 250 mm Hg after exsanguinating the arm with an Esmark bandage.    An elliptical incision was made around the skin overlying the pseudoaneurysmal segment in the fistula.  I dissected the skin away from the pseudoaneurysm with electrocautery.  Eventually, I was able to skeletonize the pseudoaneurysm.  I sharply entered into the  pseudoaneurysm to verify the position of of the pseudoaneurysm.  I sharply tailored the redundant pseudoaneurysm wall.  The residual fistula wall was felt to be adequately strong to repair.  I repaired the fistula with a running stitch of 5-0 Prolene, taking care to take large bites of the fistula wall.  Prior to completing this repair, I passed a 5 mm dilator proximally and distally: no resistance was encountered.  The perianastomotic segment of the fistula is aneurysmal 8-10 mm.  The repair was completed in the usual fashion.    At this point, the patient was given 60 mg of Protamine intravenously to reverse the anticoagulation.  Avitene were applied to the incision.  Bleeding points were controlled with suture ligature and electrocautery.  After a few more round of Avitene, no further active bleeding was noted.  I reapproximated the subcutaneous tissue immediately above the fistula with a running stitch of 3-0 Vicryl.  The skin was reapproximated with staples.  The skin was cleaned, dried, and the skin bandaged with sterile bandages.  The patient continued to have an improved thrill in the fistula.   COMPLICATIONS: none   CONDITION: stable   Adele Barthel, MD, [facility] [place] Office: [phone] Pager: [phone]  01/07/2018, 2:48 PM

## 2018-01-07 NOTE — Anesthesia Postprocedure Evaluation (Signed)
Anesthesia Post Note  Patient: Alejandro Lee  Procedure(s) Performed: PLICATION OF ARTERIOVENOUS FISTULA RIGHT ARM (Right )     Patient location during evaluation: PACU Anesthesia Type: General Level of consciousness: awake and alert Pain management: pain level controlled Vital Signs Assessment: post-procedure vital signs reviewed and stable Respiratory status: spontaneous breathing, nonlabored ventilation and respiratory function stable Cardiovascular status: blood pressure returned to baseline and stable Postop Assessment: no apparent nausea or vomiting Anesthetic complications: no    Last Vitals:  Vitals:   01/07/18 1545 01/07/18 1621  BP: (!) 153/79 (!) 160/89  Pulse: 82 86  Resp: 12 12  Temp:  (!) 36.4 C  SpO2: 95% 93%    Last Pain:  Vitals:   01/07/18 1556  TempSrc:   PainSc: 0-No pain                 Lynda Rainwater

## 2018-01-08 ENCOUNTER — Telehealth: Payer: Self-pay | Admitting: Vascular Surgery

## 2018-01-08 ENCOUNTER — Encounter (HOSPITAL_COMMUNITY): Payer: Self-pay | Admitting: Vascular Surgery

## 2018-01-08 NOTE — Telephone Encounter (Signed)
sch appt lvm 01/23/18 1pm staple removal 02/05/18 2pm P/O PA s/p R AVF plications

## 2018-01-09 DIAGNOSIS — Z992 Dependence on renal dialysis: Secondary | ICD-10-CM | POA: Diagnosis not present

## 2018-01-09 DIAGNOSIS — I509 Heart failure, unspecified: Secondary | ICD-10-CM | POA: Diagnosis not present

## 2018-01-09 DIAGNOSIS — N186 End stage renal disease: Secondary | ICD-10-CM | POA: Diagnosis not present

## 2018-01-09 DIAGNOSIS — I132 Hypertensive heart and chronic kidney disease with heart failure and with stage 5 chronic kidney disease, or end stage renal disease: Secondary | ICD-10-CM | POA: Diagnosis not present

## 2018-01-09 DIAGNOSIS — T82898D Other specified complication of vascular prosthetic devices, implants and grafts, subsequent encounter: Secondary | ICD-10-CM | POA: Diagnosis not present

## 2018-01-09 DIAGNOSIS — E1122 Type 2 diabetes mellitus with diabetic chronic kidney disease: Secondary | ICD-10-CM | POA: Diagnosis not present

## 2018-01-10 DIAGNOSIS — N186 End stage renal disease: Secondary | ICD-10-CM | POA: Diagnosis not present

## 2018-01-10 DIAGNOSIS — D631 Anemia in chronic kidney disease: Secondary | ICD-10-CM | POA: Diagnosis not present

## 2018-01-10 DIAGNOSIS — D509 Iron deficiency anemia, unspecified: Secondary | ICD-10-CM | POA: Diagnosis not present

## 2018-01-10 DIAGNOSIS — N2581 Secondary hyperparathyroidism of renal origin: Secondary | ICD-10-CM | POA: Diagnosis not present

## 2018-01-10 DIAGNOSIS — Z992 Dependence on renal dialysis: Secondary | ICD-10-CM | POA: Diagnosis not present

## 2018-01-11 DIAGNOSIS — Z992 Dependence on renal dialysis: Secondary | ICD-10-CM | POA: Diagnosis not present

## 2018-01-11 DIAGNOSIS — N186 End stage renal disease: Secondary | ICD-10-CM | POA: Diagnosis not present

## 2018-01-11 DIAGNOSIS — N2581 Secondary hyperparathyroidism of renal origin: Secondary | ICD-10-CM | POA: Diagnosis not present

## 2018-01-11 DIAGNOSIS — D631 Anemia in chronic kidney disease: Secondary | ICD-10-CM | POA: Diagnosis not present

## 2018-01-11 DIAGNOSIS — D509 Iron deficiency anemia, unspecified: Secondary | ICD-10-CM | POA: Diagnosis not present

## 2018-01-13 DIAGNOSIS — N186 End stage renal disease: Secondary | ICD-10-CM | POA: Diagnosis not present

## 2018-01-13 DIAGNOSIS — N2581 Secondary hyperparathyroidism of renal origin: Secondary | ICD-10-CM | POA: Diagnosis not present

## 2018-01-13 DIAGNOSIS — Z992 Dependence on renal dialysis: Secondary | ICD-10-CM | POA: Diagnosis not present

## 2018-01-13 DIAGNOSIS — D509 Iron deficiency anemia, unspecified: Secondary | ICD-10-CM | POA: Diagnosis not present

## 2018-01-13 DIAGNOSIS — D631 Anemia in chronic kidney disease: Secondary | ICD-10-CM | POA: Diagnosis not present

## 2018-01-14 DIAGNOSIS — I132 Hypertensive heart and chronic kidney disease with heart failure and with stage 5 chronic kidney disease, or end stage renal disease: Secondary | ICD-10-CM | POA: Diagnosis not present

## 2018-01-14 DIAGNOSIS — I509 Heart failure, unspecified: Secondary | ICD-10-CM | POA: Diagnosis not present

## 2018-01-14 DIAGNOSIS — E1122 Type 2 diabetes mellitus with diabetic chronic kidney disease: Secondary | ICD-10-CM | POA: Diagnosis not present

## 2018-01-14 DIAGNOSIS — N186 End stage renal disease: Secondary | ICD-10-CM | POA: Diagnosis not present

## 2018-01-14 DIAGNOSIS — Z992 Dependence on renal dialysis: Secondary | ICD-10-CM | POA: Diagnosis not present

## 2018-01-14 DIAGNOSIS — T82898D Other specified complication of vascular prosthetic devices, implants and grafts, subsequent encounter: Secondary | ICD-10-CM | POA: Diagnosis not present

## 2018-01-15 DIAGNOSIS — I509 Heart failure, unspecified: Secondary | ICD-10-CM | POA: Diagnosis not present

## 2018-01-15 DIAGNOSIS — E1122 Type 2 diabetes mellitus with diabetic chronic kidney disease: Secondary | ICD-10-CM | POA: Diagnosis not present

## 2018-01-15 DIAGNOSIS — Z992 Dependence on renal dialysis: Secondary | ICD-10-CM | POA: Diagnosis not present

## 2018-01-15 DIAGNOSIS — N2581 Secondary hyperparathyroidism of renal origin: Secondary | ICD-10-CM | POA: Diagnosis not present

## 2018-01-15 DIAGNOSIS — N186 End stage renal disease: Secondary | ICD-10-CM | POA: Diagnosis not present

## 2018-01-15 DIAGNOSIS — I132 Hypertensive heart and chronic kidney disease with heart failure and with stage 5 chronic kidney disease, or end stage renal disease: Secondary | ICD-10-CM | POA: Diagnosis not present

## 2018-01-15 DIAGNOSIS — T82898D Other specified complication of vascular prosthetic devices, implants and grafts, subsequent encounter: Secondary | ICD-10-CM | POA: Diagnosis not present

## 2018-01-15 DIAGNOSIS — D509 Iron deficiency anemia, unspecified: Secondary | ICD-10-CM | POA: Diagnosis not present

## 2018-01-15 DIAGNOSIS — D631 Anemia in chronic kidney disease: Secondary | ICD-10-CM | POA: Diagnosis not present

## 2018-01-17 DIAGNOSIS — D631 Anemia in chronic kidney disease: Secondary | ICD-10-CM | POA: Diagnosis not present

## 2018-01-17 DIAGNOSIS — Z992 Dependence on renal dialysis: Secondary | ICD-10-CM | POA: Diagnosis not present

## 2018-01-17 DIAGNOSIS — N186 End stage renal disease: Secondary | ICD-10-CM | POA: Diagnosis not present

## 2018-01-17 DIAGNOSIS — D509 Iron deficiency anemia, unspecified: Secondary | ICD-10-CM | POA: Diagnosis not present

## 2018-01-17 DIAGNOSIS — N2581 Secondary hyperparathyroidism of renal origin: Secondary | ICD-10-CM | POA: Diagnosis not present

## 2018-01-18 DIAGNOSIS — Z992 Dependence on renal dialysis: Secondary | ICD-10-CM | POA: Diagnosis not present

## 2018-01-18 DIAGNOSIS — D509 Iron deficiency anemia, unspecified: Secondary | ICD-10-CM | POA: Diagnosis not present

## 2018-01-18 DIAGNOSIS — N186 End stage renal disease: Secondary | ICD-10-CM | POA: Diagnosis not present

## 2018-01-18 DIAGNOSIS — D631 Anemia in chronic kidney disease: Secondary | ICD-10-CM | POA: Diagnosis not present

## 2018-01-18 DIAGNOSIS — N2581 Secondary hyperparathyroidism of renal origin: Secondary | ICD-10-CM | POA: Diagnosis not present

## 2018-01-20 DIAGNOSIS — Z992 Dependence on renal dialysis: Secondary | ICD-10-CM | POA: Diagnosis not present

## 2018-01-20 DIAGNOSIS — D631 Anemia in chronic kidney disease: Secondary | ICD-10-CM | POA: Diagnosis not present

## 2018-01-20 DIAGNOSIS — D509 Iron deficiency anemia, unspecified: Secondary | ICD-10-CM | POA: Diagnosis not present

## 2018-01-20 DIAGNOSIS — N186 End stage renal disease: Secondary | ICD-10-CM | POA: Diagnosis not present

## 2018-01-20 DIAGNOSIS — N2581 Secondary hyperparathyroidism of renal origin: Secondary | ICD-10-CM | POA: Diagnosis not present

## 2018-01-22 ENCOUNTER — Ambulatory Visit: Payer: Self-pay | Admitting: "Endocrinology

## 2018-01-22 DIAGNOSIS — N2581 Secondary hyperparathyroidism of renal origin: Secondary | ICD-10-CM | POA: Diagnosis not present

## 2018-01-22 DIAGNOSIS — N186 End stage renal disease: Secondary | ICD-10-CM | POA: Diagnosis not present

## 2018-01-22 DIAGNOSIS — I509 Heart failure, unspecified: Secondary | ICD-10-CM | POA: Diagnosis not present

## 2018-01-22 DIAGNOSIS — D631 Anemia in chronic kidney disease: Secondary | ICD-10-CM | POA: Diagnosis not present

## 2018-01-22 DIAGNOSIS — I132 Hypertensive heart and chronic kidney disease with heart failure and with stage 5 chronic kidney disease, or end stage renal disease: Secondary | ICD-10-CM | POA: Diagnosis not present

## 2018-01-22 DIAGNOSIS — D509 Iron deficiency anemia, unspecified: Secondary | ICD-10-CM | POA: Diagnosis not present

## 2018-01-22 DIAGNOSIS — Z992 Dependence on renal dialysis: Secondary | ICD-10-CM | POA: Diagnosis not present

## 2018-01-22 DIAGNOSIS — T82898D Other specified complication of vascular prosthetic devices, implants and grafts, subsequent encounter: Secondary | ICD-10-CM | POA: Diagnosis not present

## 2018-01-22 DIAGNOSIS — E1122 Type 2 diabetes mellitus with diabetic chronic kidney disease: Secondary | ICD-10-CM | POA: Diagnosis not present

## 2018-01-23 ENCOUNTER — Encounter: Payer: Self-pay | Admitting: Physician Assistant

## 2018-01-23 ENCOUNTER — Ambulatory Visit (INDEPENDENT_AMBULATORY_CARE_PROVIDER_SITE_OTHER): Payer: Medicare Other | Admitting: Physician Assistant

## 2018-01-23 ENCOUNTER — Other Ambulatory Visit: Payer: Self-pay

## 2018-01-23 VITALS — BP 133/78 | HR 90 | Resp 20 | Ht 67.0 in | Wt 205.0 lb

## 2018-01-23 DIAGNOSIS — Z992 Dependence on renal dialysis: Secondary | ICD-10-CM

## 2018-01-23 DIAGNOSIS — N186 End stage renal disease: Secondary | ICD-10-CM

## 2018-01-23 NOTE — Progress Notes (Signed)
    Postoperative Access Visit   History of Present Illness   DEAKON FRIX is a 57 y.o. 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  This was performed by Dr. Bridgett Larsson on 01/07/18.  The patient's wounds are healed.  The patient denies steal symptoms.  The patient is able to complete their activities of daily living.  The patient is concerned about another area of pseudoaneurysm degeneration closer to the elbow.  He is dialyzing on a MWF schedule without complication from R arm fistula   Physical Examination   Vitals:   01/23/18 1341  BP: 133/78  Pulse: 90  Resp: 20  SpO2: 99%  Weight: 205 lb (93 kg)  Height: 5\' 7"  (1.702 m)   Body mass index is 32.11 kg/m.  right arm Incision is healed, hand grip is 5/5, sensation in digits is intact, palpable thrill, bruit can be auscultated; palpable R radial pulse     Medical Decision Making   ARIS EVEN is a 57 y.o. year old male who presents s/p right arm Av fistula plication   Staples removed today  He will follow up in 2 weeks for formal post operative office visit  He may require an additional plication of pseudoaneurysmal degeneration closer to the elbow  Continue to avoid area of incision during dialysis until at least 4 weeks post op   Dagoberto Ligas PA-C [facility] Office: [phone]

## 2018-01-24 DIAGNOSIS — N186 End stage renal disease: Secondary | ICD-10-CM | POA: Diagnosis not present

## 2018-01-24 DIAGNOSIS — N2581 Secondary hyperparathyroidism of renal origin: Secondary | ICD-10-CM | POA: Diagnosis not present

## 2018-01-24 DIAGNOSIS — Z992 Dependence on renal dialysis: Secondary | ICD-10-CM | POA: Diagnosis not present

## 2018-01-24 DIAGNOSIS — D509 Iron deficiency anemia, unspecified: Secondary | ICD-10-CM | POA: Diagnosis not present

## 2018-01-24 DIAGNOSIS — D631 Anemia in chronic kidney disease: Secondary | ICD-10-CM | POA: Diagnosis not present

## 2018-01-27 DIAGNOSIS — D509 Iron deficiency anemia, unspecified: Secondary | ICD-10-CM | POA: Diagnosis not present

## 2018-01-27 DIAGNOSIS — N2581 Secondary hyperparathyroidism of renal origin: Secondary | ICD-10-CM | POA: Diagnosis not present

## 2018-01-27 DIAGNOSIS — Z992 Dependence on renal dialysis: Secondary | ICD-10-CM | POA: Diagnosis not present

## 2018-01-27 DIAGNOSIS — D631 Anemia in chronic kidney disease: Secondary | ICD-10-CM | POA: Diagnosis not present

## 2018-01-27 DIAGNOSIS — N186 End stage renal disease: Secondary | ICD-10-CM | POA: Diagnosis not present

## 2018-01-30 DIAGNOSIS — N186 End stage renal disease: Secondary | ICD-10-CM | POA: Diagnosis not present

## 2018-01-30 DIAGNOSIS — I132 Hypertensive heart and chronic kidney disease with heart failure and with stage 5 chronic kidney disease, or end stage renal disease: Secondary | ICD-10-CM | POA: Diagnosis not present

## 2018-01-30 DIAGNOSIS — Z992 Dependence on renal dialysis: Secondary | ICD-10-CM | POA: Diagnosis not present

## 2018-01-30 DIAGNOSIS — I509 Heart failure, unspecified: Secondary | ICD-10-CM | POA: Diagnosis not present

## 2018-01-30 DIAGNOSIS — E1122 Type 2 diabetes mellitus with diabetic chronic kidney disease: Secondary | ICD-10-CM | POA: Diagnosis not present

## 2018-01-30 DIAGNOSIS — T82898D Other specified complication of vascular prosthetic devices, implants and grafts, subsequent encounter: Secondary | ICD-10-CM | POA: Diagnosis not present

## 2018-01-31 DIAGNOSIS — N186 End stage renal disease: Secondary | ICD-10-CM | POA: Diagnosis not present

## 2018-01-31 DIAGNOSIS — D631 Anemia in chronic kidney disease: Secondary | ICD-10-CM | POA: Diagnosis not present

## 2018-01-31 DIAGNOSIS — Z992 Dependence on renal dialysis: Secondary | ICD-10-CM | POA: Diagnosis not present

## 2018-01-31 DIAGNOSIS — D509 Iron deficiency anemia, unspecified: Secondary | ICD-10-CM | POA: Diagnosis not present

## 2018-01-31 DIAGNOSIS — N2581 Secondary hyperparathyroidism of renal origin: Secondary | ICD-10-CM | POA: Diagnosis not present

## 2018-02-03 DIAGNOSIS — N2581 Secondary hyperparathyroidism of renal origin: Secondary | ICD-10-CM | POA: Diagnosis not present

## 2018-02-03 DIAGNOSIS — Z992 Dependence on renal dialysis: Secondary | ICD-10-CM | POA: Diagnosis not present

## 2018-02-03 DIAGNOSIS — N186 End stage renal disease: Secondary | ICD-10-CM | POA: Diagnosis not present

## 2018-02-03 DIAGNOSIS — D509 Iron deficiency anemia, unspecified: Secondary | ICD-10-CM | POA: Diagnosis not present

## 2018-02-03 DIAGNOSIS — D631 Anemia in chronic kidney disease: Secondary | ICD-10-CM | POA: Diagnosis not present

## 2018-02-05 DIAGNOSIS — D631 Anemia in chronic kidney disease: Secondary | ICD-10-CM | POA: Diagnosis not present

## 2018-02-05 DIAGNOSIS — N2581 Secondary hyperparathyroidism of renal origin: Secondary | ICD-10-CM | POA: Diagnosis not present

## 2018-02-05 DIAGNOSIS — N186 End stage renal disease: Secondary | ICD-10-CM | POA: Diagnosis not present

## 2018-02-05 DIAGNOSIS — D509 Iron deficiency anemia, unspecified: Secondary | ICD-10-CM | POA: Diagnosis not present

## 2018-02-05 DIAGNOSIS — Z992 Dependence on renal dialysis: Secondary | ICD-10-CM | POA: Diagnosis not present

## 2018-02-06 ENCOUNTER — Other Ambulatory Visit: Payer: Self-pay | Admitting: Family Medicine

## 2018-02-06 DIAGNOSIS — G8929 Other chronic pain: Secondary | ICD-10-CM

## 2018-02-06 DIAGNOSIS — M545 Low back pain: Principal | ICD-10-CM

## 2018-02-07 DIAGNOSIS — N2581 Secondary hyperparathyroidism of renal origin: Secondary | ICD-10-CM | POA: Diagnosis not present

## 2018-02-07 DIAGNOSIS — Z992 Dependence on renal dialysis: Secondary | ICD-10-CM | POA: Diagnosis not present

## 2018-02-07 DIAGNOSIS — D509 Iron deficiency anemia, unspecified: Secondary | ICD-10-CM | POA: Diagnosis not present

## 2018-02-07 DIAGNOSIS — N186 End stage renal disease: Secondary | ICD-10-CM | POA: Diagnosis not present

## 2018-02-07 DIAGNOSIS — D631 Anemia in chronic kidney disease: Secondary | ICD-10-CM | POA: Diagnosis not present

## 2018-02-09 DIAGNOSIS — N186 End stage renal disease: Secondary | ICD-10-CM | POA: Diagnosis not present

## 2018-02-09 DIAGNOSIS — Z992 Dependence on renal dialysis: Secondary | ICD-10-CM | POA: Diagnosis not present

## 2018-02-10 DIAGNOSIS — N186 End stage renal disease: Secondary | ICD-10-CM | POA: Diagnosis not present

## 2018-02-10 DIAGNOSIS — Z992 Dependence on renal dialysis: Secondary | ICD-10-CM | POA: Diagnosis not present

## 2018-02-10 DIAGNOSIS — D509 Iron deficiency anemia, unspecified: Secondary | ICD-10-CM | POA: Diagnosis not present

## 2018-02-10 DIAGNOSIS — N2581 Secondary hyperparathyroidism of renal origin: Secondary | ICD-10-CM | POA: Diagnosis not present

## 2018-02-10 DIAGNOSIS — D631 Anemia in chronic kidney disease: Secondary | ICD-10-CM | POA: Diagnosis not present

## 2018-02-12 DIAGNOSIS — D509 Iron deficiency anemia, unspecified: Secondary | ICD-10-CM | POA: Diagnosis not present

## 2018-02-12 DIAGNOSIS — Z992 Dependence on renal dialysis: Secondary | ICD-10-CM | POA: Diagnosis not present

## 2018-02-12 DIAGNOSIS — D631 Anemia in chronic kidney disease: Secondary | ICD-10-CM | POA: Diagnosis not present

## 2018-02-12 DIAGNOSIS — N186 End stage renal disease: Secondary | ICD-10-CM | POA: Diagnosis not present

## 2018-02-12 DIAGNOSIS — N2581 Secondary hyperparathyroidism of renal origin: Secondary | ICD-10-CM | POA: Diagnosis not present

## 2018-02-17 DIAGNOSIS — N2581 Secondary hyperparathyroidism of renal origin: Secondary | ICD-10-CM | POA: Diagnosis not present

## 2018-02-17 DIAGNOSIS — D509 Iron deficiency anemia, unspecified: Secondary | ICD-10-CM | POA: Diagnosis not present

## 2018-02-17 DIAGNOSIS — N186 End stage renal disease: Secondary | ICD-10-CM | POA: Diagnosis not present

## 2018-02-17 DIAGNOSIS — D631 Anemia in chronic kidney disease: Secondary | ICD-10-CM | POA: Diagnosis not present

## 2018-02-17 DIAGNOSIS — Z992 Dependence on renal dialysis: Secondary | ICD-10-CM | POA: Diagnosis not present

## 2018-02-18 ENCOUNTER — Ambulatory Visit: Payer: Medicare Other | Admitting: "Endocrinology

## 2018-02-18 ENCOUNTER — Other Ambulatory Visit: Payer: Self-pay

## 2018-02-18 DIAGNOSIS — Z992 Dependence on renal dialysis: Principal | ICD-10-CM

## 2018-02-18 DIAGNOSIS — T82898A Other specified complication of vascular prosthetic devices, implants and grafts, initial encounter: Secondary | ICD-10-CM

## 2018-02-18 DIAGNOSIS — N186 End stage renal disease: Secondary | ICD-10-CM

## 2018-02-18 DIAGNOSIS — T82510A Breakdown (mechanical) of surgically created arteriovenous fistula, initial encounter: Secondary | ICD-10-CM

## 2018-02-19 DIAGNOSIS — N186 End stage renal disease: Secondary | ICD-10-CM | POA: Diagnosis not present

## 2018-02-19 DIAGNOSIS — D631 Anemia in chronic kidney disease: Secondary | ICD-10-CM | POA: Diagnosis not present

## 2018-02-19 DIAGNOSIS — D509 Iron deficiency anemia, unspecified: Secondary | ICD-10-CM | POA: Diagnosis not present

## 2018-02-19 DIAGNOSIS — N2581 Secondary hyperparathyroidism of renal origin: Secondary | ICD-10-CM | POA: Diagnosis not present

## 2018-02-19 DIAGNOSIS — Z992 Dependence on renal dialysis: Secondary | ICD-10-CM | POA: Diagnosis not present

## 2018-02-21 DIAGNOSIS — N2581 Secondary hyperparathyroidism of renal origin: Secondary | ICD-10-CM | POA: Diagnosis not present

## 2018-02-21 DIAGNOSIS — D509 Iron deficiency anemia, unspecified: Secondary | ICD-10-CM | POA: Diagnosis not present

## 2018-02-21 DIAGNOSIS — Z992 Dependence on renal dialysis: Secondary | ICD-10-CM | POA: Diagnosis not present

## 2018-02-21 DIAGNOSIS — N186 End stage renal disease: Secondary | ICD-10-CM | POA: Diagnosis not present

## 2018-02-21 DIAGNOSIS — D631 Anemia in chronic kidney disease: Secondary | ICD-10-CM | POA: Diagnosis not present

## 2018-02-24 DIAGNOSIS — D509 Iron deficiency anemia, unspecified: Secondary | ICD-10-CM | POA: Diagnosis not present

## 2018-02-24 DIAGNOSIS — Z992 Dependence on renal dialysis: Secondary | ICD-10-CM | POA: Diagnosis not present

## 2018-02-24 DIAGNOSIS — N2581 Secondary hyperparathyroidism of renal origin: Secondary | ICD-10-CM | POA: Diagnosis not present

## 2018-02-24 DIAGNOSIS — D631 Anemia in chronic kidney disease: Secondary | ICD-10-CM | POA: Diagnosis not present

## 2018-02-24 DIAGNOSIS — N186 End stage renal disease: Secondary | ICD-10-CM | POA: Diagnosis not present

## 2018-02-26 DIAGNOSIS — N186 End stage renal disease: Secondary | ICD-10-CM | POA: Diagnosis not present

## 2018-02-26 DIAGNOSIS — D509 Iron deficiency anemia, unspecified: Secondary | ICD-10-CM | POA: Diagnosis not present

## 2018-02-26 DIAGNOSIS — N2581 Secondary hyperparathyroidism of renal origin: Secondary | ICD-10-CM | POA: Diagnosis not present

## 2018-02-26 DIAGNOSIS — Z992 Dependence on renal dialysis: Secondary | ICD-10-CM | POA: Diagnosis not present

## 2018-02-26 DIAGNOSIS — D631 Anemia in chronic kidney disease: Secondary | ICD-10-CM | POA: Diagnosis not present

## 2018-02-28 DIAGNOSIS — N2581 Secondary hyperparathyroidism of renal origin: Secondary | ICD-10-CM | POA: Diagnosis not present

## 2018-02-28 DIAGNOSIS — D631 Anemia in chronic kidney disease: Secondary | ICD-10-CM | POA: Diagnosis not present

## 2018-02-28 DIAGNOSIS — Z992 Dependence on renal dialysis: Secondary | ICD-10-CM | POA: Diagnosis not present

## 2018-02-28 DIAGNOSIS — D509 Iron deficiency anemia, unspecified: Secondary | ICD-10-CM | POA: Diagnosis not present

## 2018-02-28 DIAGNOSIS — N186 End stage renal disease: Secondary | ICD-10-CM | POA: Diagnosis not present

## 2018-03-03 DIAGNOSIS — Z992 Dependence on renal dialysis: Secondary | ICD-10-CM | POA: Diagnosis not present

## 2018-03-03 DIAGNOSIS — N2581 Secondary hyperparathyroidism of renal origin: Secondary | ICD-10-CM | POA: Diagnosis not present

## 2018-03-03 DIAGNOSIS — D631 Anemia in chronic kidney disease: Secondary | ICD-10-CM | POA: Diagnosis not present

## 2018-03-03 DIAGNOSIS — N186 End stage renal disease: Secondary | ICD-10-CM | POA: Diagnosis not present

## 2018-03-03 DIAGNOSIS — D509 Iron deficiency anemia, unspecified: Secondary | ICD-10-CM | POA: Diagnosis not present

## 2018-03-05 DIAGNOSIS — N2581 Secondary hyperparathyroidism of renal origin: Secondary | ICD-10-CM | POA: Diagnosis not present

## 2018-03-05 DIAGNOSIS — D509 Iron deficiency anemia, unspecified: Secondary | ICD-10-CM | POA: Diagnosis not present

## 2018-03-05 DIAGNOSIS — D631 Anemia in chronic kidney disease: Secondary | ICD-10-CM | POA: Diagnosis not present

## 2018-03-05 DIAGNOSIS — Z992 Dependence on renal dialysis: Secondary | ICD-10-CM | POA: Diagnosis not present

## 2018-03-05 DIAGNOSIS — N186 End stage renal disease: Secondary | ICD-10-CM | POA: Diagnosis not present

## 2018-03-07 DIAGNOSIS — D509 Iron deficiency anemia, unspecified: Secondary | ICD-10-CM | POA: Diagnosis not present

## 2018-03-07 DIAGNOSIS — D631 Anemia in chronic kidney disease: Secondary | ICD-10-CM | POA: Diagnosis not present

## 2018-03-07 DIAGNOSIS — N186 End stage renal disease: Secondary | ICD-10-CM | POA: Diagnosis not present

## 2018-03-07 DIAGNOSIS — N2581 Secondary hyperparathyroidism of renal origin: Secondary | ICD-10-CM | POA: Diagnosis not present

## 2018-03-07 DIAGNOSIS — Z992 Dependence on renal dialysis: Secondary | ICD-10-CM | POA: Diagnosis not present

## 2018-03-10 DIAGNOSIS — Z992 Dependence on renal dialysis: Secondary | ICD-10-CM | POA: Diagnosis not present

## 2018-03-10 DIAGNOSIS — N186 End stage renal disease: Secondary | ICD-10-CM | POA: Diagnosis not present

## 2018-03-10 DIAGNOSIS — D509 Iron deficiency anemia, unspecified: Secondary | ICD-10-CM | POA: Diagnosis not present

## 2018-03-10 DIAGNOSIS — D631 Anemia in chronic kidney disease: Secondary | ICD-10-CM | POA: Diagnosis not present

## 2018-03-10 DIAGNOSIS — N2581 Secondary hyperparathyroidism of renal origin: Secondary | ICD-10-CM | POA: Diagnosis not present

## 2018-03-12 ENCOUNTER — Ambulatory Visit: Payer: Medicare Other | Admitting: "Endocrinology

## 2018-03-12 DIAGNOSIS — N186 End stage renal disease: Secondary | ICD-10-CM | POA: Diagnosis not present

## 2018-03-12 DIAGNOSIS — Z992 Dependence on renal dialysis: Secondary | ICD-10-CM | POA: Diagnosis not present

## 2018-03-13 DIAGNOSIS — D631 Anemia in chronic kidney disease: Secondary | ICD-10-CM | POA: Diagnosis not present

## 2018-03-13 DIAGNOSIS — N2581 Secondary hyperparathyroidism of renal origin: Secondary | ICD-10-CM | POA: Diagnosis not present

## 2018-03-13 DIAGNOSIS — N186 End stage renal disease: Secondary | ICD-10-CM | POA: Diagnosis not present

## 2018-03-13 DIAGNOSIS — D509 Iron deficiency anemia, unspecified: Secondary | ICD-10-CM | POA: Diagnosis not present

## 2018-03-13 DIAGNOSIS — Z992 Dependence on renal dialysis: Secondary | ICD-10-CM | POA: Diagnosis not present

## 2018-03-14 DIAGNOSIS — D631 Anemia in chronic kidney disease: Secondary | ICD-10-CM | POA: Diagnosis not present

## 2018-03-14 DIAGNOSIS — Z992 Dependence on renal dialysis: Secondary | ICD-10-CM | POA: Diagnosis not present

## 2018-03-14 DIAGNOSIS — D509 Iron deficiency anemia, unspecified: Secondary | ICD-10-CM | POA: Diagnosis not present

## 2018-03-14 DIAGNOSIS — N186 End stage renal disease: Secondary | ICD-10-CM | POA: Diagnosis not present

## 2018-03-14 DIAGNOSIS — N2581 Secondary hyperparathyroidism of renal origin: Secondary | ICD-10-CM | POA: Diagnosis not present

## 2018-03-17 DIAGNOSIS — N186 End stage renal disease: Secondary | ICD-10-CM | POA: Diagnosis not present

## 2018-03-17 DIAGNOSIS — Z992 Dependence on renal dialysis: Secondary | ICD-10-CM | POA: Diagnosis not present

## 2018-03-17 DIAGNOSIS — N2581 Secondary hyperparathyroidism of renal origin: Secondary | ICD-10-CM | POA: Diagnosis not present

## 2018-03-17 DIAGNOSIS — D509 Iron deficiency anemia, unspecified: Secondary | ICD-10-CM | POA: Diagnosis not present

## 2018-03-17 DIAGNOSIS — D631 Anemia in chronic kidney disease: Secondary | ICD-10-CM | POA: Diagnosis not present

## 2018-03-19 DIAGNOSIS — D631 Anemia in chronic kidney disease: Secondary | ICD-10-CM | POA: Diagnosis not present

## 2018-03-19 DIAGNOSIS — D509 Iron deficiency anemia, unspecified: Secondary | ICD-10-CM | POA: Diagnosis not present

## 2018-03-19 DIAGNOSIS — N186 End stage renal disease: Secondary | ICD-10-CM | POA: Diagnosis not present

## 2018-03-19 DIAGNOSIS — N2581 Secondary hyperparathyroidism of renal origin: Secondary | ICD-10-CM | POA: Diagnosis not present

## 2018-03-19 DIAGNOSIS — Z992 Dependence on renal dialysis: Secondary | ICD-10-CM | POA: Diagnosis not present

## 2018-03-24 DIAGNOSIS — D631 Anemia in chronic kidney disease: Secondary | ICD-10-CM | POA: Diagnosis not present

## 2018-03-24 DIAGNOSIS — N2581 Secondary hyperparathyroidism of renal origin: Secondary | ICD-10-CM | POA: Diagnosis not present

## 2018-03-24 DIAGNOSIS — N186 End stage renal disease: Secondary | ICD-10-CM | POA: Diagnosis not present

## 2018-03-24 DIAGNOSIS — Z992 Dependence on renal dialysis: Secondary | ICD-10-CM | POA: Diagnosis not present

## 2018-03-24 DIAGNOSIS — D509 Iron deficiency anemia, unspecified: Secondary | ICD-10-CM | POA: Diagnosis not present

## 2018-03-25 ENCOUNTER — Ambulatory Visit: Payer: Medicare Other | Admitting: Family Medicine

## 2018-03-25 ENCOUNTER — Ambulatory Visit (HOSPITAL_COMMUNITY): Admission: RE | Admit: 2018-03-25 | Payer: Medicare Other | Source: Ambulatory Visit

## 2018-03-25 ENCOUNTER — Ambulatory Visit: Payer: Medicare Other | Admitting: Vascular Surgery

## 2018-03-26 DIAGNOSIS — D509 Iron deficiency anemia, unspecified: Secondary | ICD-10-CM | POA: Diagnosis not present

## 2018-03-26 DIAGNOSIS — N2581 Secondary hyperparathyroidism of renal origin: Secondary | ICD-10-CM | POA: Diagnosis not present

## 2018-03-26 DIAGNOSIS — Z992 Dependence on renal dialysis: Secondary | ICD-10-CM | POA: Diagnosis not present

## 2018-03-26 DIAGNOSIS — D631 Anemia in chronic kidney disease: Secondary | ICD-10-CM | POA: Diagnosis not present

## 2018-03-26 DIAGNOSIS — N186 End stage renal disease: Secondary | ICD-10-CM | POA: Diagnosis not present

## 2018-03-28 DIAGNOSIS — Z992 Dependence on renal dialysis: Secondary | ICD-10-CM | POA: Diagnosis not present

## 2018-03-28 DIAGNOSIS — D509 Iron deficiency anemia, unspecified: Secondary | ICD-10-CM | POA: Diagnosis not present

## 2018-03-28 DIAGNOSIS — D631 Anemia in chronic kidney disease: Secondary | ICD-10-CM | POA: Diagnosis not present

## 2018-03-28 DIAGNOSIS — N186 End stage renal disease: Secondary | ICD-10-CM | POA: Diagnosis not present

## 2018-03-28 DIAGNOSIS — N2581 Secondary hyperparathyroidism of renal origin: Secondary | ICD-10-CM | POA: Diagnosis not present

## 2018-03-31 DIAGNOSIS — N186 End stage renal disease: Secondary | ICD-10-CM | POA: Diagnosis not present

## 2018-03-31 DIAGNOSIS — D631 Anemia in chronic kidney disease: Secondary | ICD-10-CM | POA: Diagnosis not present

## 2018-03-31 DIAGNOSIS — N2581 Secondary hyperparathyroidism of renal origin: Secondary | ICD-10-CM | POA: Diagnosis not present

## 2018-03-31 DIAGNOSIS — D509 Iron deficiency anemia, unspecified: Secondary | ICD-10-CM | POA: Diagnosis not present

## 2018-03-31 DIAGNOSIS — Z992 Dependence on renal dialysis: Secondary | ICD-10-CM | POA: Diagnosis not present

## 2018-04-02 DIAGNOSIS — D509 Iron deficiency anemia, unspecified: Secondary | ICD-10-CM | POA: Diagnosis not present

## 2018-04-02 DIAGNOSIS — D631 Anemia in chronic kidney disease: Secondary | ICD-10-CM | POA: Diagnosis not present

## 2018-04-02 DIAGNOSIS — Z992 Dependence on renal dialysis: Secondary | ICD-10-CM | POA: Diagnosis not present

## 2018-04-02 DIAGNOSIS — N2581 Secondary hyperparathyroidism of renal origin: Secondary | ICD-10-CM | POA: Diagnosis not present

## 2018-04-02 DIAGNOSIS — N186 End stage renal disease: Secondary | ICD-10-CM | POA: Diagnosis not present

## 2018-04-04 DIAGNOSIS — D509 Iron deficiency anemia, unspecified: Secondary | ICD-10-CM | POA: Diagnosis not present

## 2018-04-04 DIAGNOSIS — D631 Anemia in chronic kidney disease: Secondary | ICD-10-CM | POA: Diagnosis not present

## 2018-04-04 DIAGNOSIS — N186 End stage renal disease: Secondary | ICD-10-CM | POA: Diagnosis not present

## 2018-04-04 DIAGNOSIS — Z992 Dependence on renal dialysis: Secondary | ICD-10-CM | POA: Diagnosis not present

## 2018-04-04 DIAGNOSIS — N2581 Secondary hyperparathyroidism of renal origin: Secondary | ICD-10-CM | POA: Diagnosis not present

## 2018-04-07 DIAGNOSIS — N2581 Secondary hyperparathyroidism of renal origin: Secondary | ICD-10-CM | POA: Diagnosis not present

## 2018-04-07 DIAGNOSIS — D631 Anemia in chronic kidney disease: Secondary | ICD-10-CM | POA: Diagnosis not present

## 2018-04-07 DIAGNOSIS — D509 Iron deficiency anemia, unspecified: Secondary | ICD-10-CM | POA: Diagnosis not present

## 2018-04-07 DIAGNOSIS — N186 End stage renal disease: Secondary | ICD-10-CM | POA: Diagnosis not present

## 2018-04-07 DIAGNOSIS — Z992 Dependence on renal dialysis: Secondary | ICD-10-CM | POA: Diagnosis not present

## 2018-04-09 DIAGNOSIS — N2581 Secondary hyperparathyroidism of renal origin: Secondary | ICD-10-CM | POA: Diagnosis not present

## 2018-04-09 DIAGNOSIS — Z992 Dependence on renal dialysis: Secondary | ICD-10-CM | POA: Diagnosis not present

## 2018-04-09 DIAGNOSIS — D509 Iron deficiency anemia, unspecified: Secondary | ICD-10-CM | POA: Diagnosis not present

## 2018-04-09 DIAGNOSIS — D631 Anemia in chronic kidney disease: Secondary | ICD-10-CM | POA: Diagnosis not present

## 2018-04-09 DIAGNOSIS — N186 End stage renal disease: Secondary | ICD-10-CM | POA: Diagnosis not present

## 2018-04-11 DIAGNOSIS — Z992 Dependence on renal dialysis: Secondary | ICD-10-CM | POA: Diagnosis not present

## 2018-04-11 DIAGNOSIS — N186 End stage renal disease: Secondary | ICD-10-CM | POA: Diagnosis not present

## 2018-04-11 DIAGNOSIS — D631 Anemia in chronic kidney disease: Secondary | ICD-10-CM | POA: Diagnosis not present

## 2018-04-11 DIAGNOSIS — N2581 Secondary hyperparathyroidism of renal origin: Secondary | ICD-10-CM | POA: Diagnosis not present

## 2018-04-11 DIAGNOSIS — D509 Iron deficiency anemia, unspecified: Secondary | ICD-10-CM | POA: Diagnosis not present

## 2018-04-13 DIAGNOSIS — N186 End stage renal disease: Secondary | ICD-10-CM | POA: Diagnosis not present

## 2018-04-13 DIAGNOSIS — N2581 Secondary hyperparathyroidism of renal origin: Secondary | ICD-10-CM | POA: Diagnosis not present

## 2018-04-13 DIAGNOSIS — Z992 Dependence on renal dialysis: Secondary | ICD-10-CM | POA: Diagnosis not present

## 2018-04-13 DIAGNOSIS — D509 Iron deficiency anemia, unspecified: Secondary | ICD-10-CM | POA: Diagnosis not present

## 2018-04-13 DIAGNOSIS — D631 Anemia in chronic kidney disease: Secondary | ICD-10-CM | POA: Diagnosis not present

## 2018-04-14 DIAGNOSIS — D631 Anemia in chronic kidney disease: Secondary | ICD-10-CM | POA: Diagnosis not present

## 2018-04-14 DIAGNOSIS — N186 End stage renal disease: Secondary | ICD-10-CM | POA: Diagnosis not present

## 2018-04-14 DIAGNOSIS — N2581 Secondary hyperparathyroidism of renal origin: Secondary | ICD-10-CM | POA: Diagnosis not present

## 2018-04-14 DIAGNOSIS — Z992 Dependence on renal dialysis: Secondary | ICD-10-CM | POA: Diagnosis not present

## 2018-04-14 DIAGNOSIS — D509 Iron deficiency anemia, unspecified: Secondary | ICD-10-CM | POA: Diagnosis not present

## 2018-04-16 DIAGNOSIS — D631 Anemia in chronic kidney disease: Secondary | ICD-10-CM | POA: Diagnosis not present

## 2018-04-16 DIAGNOSIS — D509 Iron deficiency anemia, unspecified: Secondary | ICD-10-CM | POA: Diagnosis not present

## 2018-04-16 DIAGNOSIS — Z992 Dependence on renal dialysis: Secondary | ICD-10-CM | POA: Diagnosis not present

## 2018-04-16 DIAGNOSIS — E875 Hyperkalemia: Secondary | ICD-10-CM | POA: Diagnosis not present

## 2018-04-16 DIAGNOSIS — N186 End stage renal disease: Secondary | ICD-10-CM | POA: Diagnosis not present

## 2018-04-16 DIAGNOSIS — N2581 Secondary hyperparathyroidism of renal origin: Secondary | ICD-10-CM | POA: Diagnosis not present

## 2018-04-18 DIAGNOSIS — N2581 Secondary hyperparathyroidism of renal origin: Secondary | ICD-10-CM | POA: Diagnosis not present

## 2018-04-18 DIAGNOSIS — D631 Anemia in chronic kidney disease: Secondary | ICD-10-CM | POA: Diagnosis not present

## 2018-04-18 DIAGNOSIS — N186 End stage renal disease: Secondary | ICD-10-CM | POA: Diagnosis not present

## 2018-04-18 DIAGNOSIS — Z992 Dependence on renal dialysis: Secondary | ICD-10-CM | POA: Diagnosis not present

## 2018-04-18 DIAGNOSIS — D509 Iron deficiency anemia, unspecified: Secondary | ICD-10-CM | POA: Diagnosis not present

## 2018-04-21 DIAGNOSIS — N2581 Secondary hyperparathyroidism of renal origin: Secondary | ICD-10-CM | POA: Diagnosis not present

## 2018-04-21 DIAGNOSIS — D509 Iron deficiency anemia, unspecified: Secondary | ICD-10-CM | POA: Diagnosis not present

## 2018-04-21 DIAGNOSIS — Z992 Dependence on renal dialysis: Secondary | ICD-10-CM | POA: Diagnosis not present

## 2018-04-21 DIAGNOSIS — D631 Anemia in chronic kidney disease: Secondary | ICD-10-CM | POA: Diagnosis not present

## 2018-04-21 DIAGNOSIS — N186 End stage renal disease: Secondary | ICD-10-CM | POA: Diagnosis not present

## 2018-04-23 DIAGNOSIS — D509 Iron deficiency anemia, unspecified: Secondary | ICD-10-CM | POA: Diagnosis not present

## 2018-04-23 DIAGNOSIS — N186 End stage renal disease: Secondary | ICD-10-CM | POA: Diagnosis not present

## 2018-04-23 DIAGNOSIS — N2581 Secondary hyperparathyroidism of renal origin: Secondary | ICD-10-CM | POA: Diagnosis not present

## 2018-04-23 DIAGNOSIS — D631 Anemia in chronic kidney disease: Secondary | ICD-10-CM | POA: Diagnosis not present

## 2018-04-23 DIAGNOSIS — Z992 Dependence on renal dialysis: Secondary | ICD-10-CM | POA: Diagnosis not present

## 2018-04-25 DIAGNOSIS — Z992 Dependence on renal dialysis: Secondary | ICD-10-CM | POA: Diagnosis not present

## 2018-04-25 DIAGNOSIS — N186 End stage renal disease: Secondary | ICD-10-CM | POA: Diagnosis not present

## 2018-04-25 DIAGNOSIS — N2581 Secondary hyperparathyroidism of renal origin: Secondary | ICD-10-CM | POA: Diagnosis not present

## 2018-04-25 DIAGNOSIS — D509 Iron deficiency anemia, unspecified: Secondary | ICD-10-CM | POA: Diagnosis not present

## 2018-04-25 DIAGNOSIS — D631 Anemia in chronic kidney disease: Secondary | ICD-10-CM | POA: Diagnosis not present

## 2018-04-28 DIAGNOSIS — Z992 Dependence on renal dialysis: Secondary | ICD-10-CM | POA: Diagnosis not present

## 2018-04-28 DIAGNOSIS — D631 Anemia in chronic kidney disease: Secondary | ICD-10-CM | POA: Diagnosis not present

## 2018-04-28 DIAGNOSIS — D509 Iron deficiency anemia, unspecified: Secondary | ICD-10-CM | POA: Diagnosis not present

## 2018-04-28 DIAGNOSIS — N2581 Secondary hyperparathyroidism of renal origin: Secondary | ICD-10-CM | POA: Diagnosis not present

## 2018-04-28 DIAGNOSIS — N186 End stage renal disease: Secondary | ICD-10-CM | POA: Diagnosis not present

## 2018-04-30 DIAGNOSIS — N2581 Secondary hyperparathyroidism of renal origin: Secondary | ICD-10-CM | POA: Diagnosis not present

## 2018-04-30 DIAGNOSIS — Z992 Dependence on renal dialysis: Secondary | ICD-10-CM | POA: Diagnosis not present

## 2018-04-30 DIAGNOSIS — D631 Anemia in chronic kidney disease: Secondary | ICD-10-CM | POA: Diagnosis not present

## 2018-04-30 DIAGNOSIS — D509 Iron deficiency anemia, unspecified: Secondary | ICD-10-CM | POA: Diagnosis not present

## 2018-04-30 DIAGNOSIS — N186 End stage renal disease: Secondary | ICD-10-CM | POA: Diagnosis not present

## 2018-05-05 DIAGNOSIS — Z992 Dependence on renal dialysis: Secondary | ICD-10-CM | POA: Diagnosis not present

## 2018-05-05 DIAGNOSIS — D631 Anemia in chronic kidney disease: Secondary | ICD-10-CM | POA: Diagnosis not present

## 2018-05-05 DIAGNOSIS — N186 End stage renal disease: Secondary | ICD-10-CM | POA: Diagnosis not present

## 2018-05-05 DIAGNOSIS — N2581 Secondary hyperparathyroidism of renal origin: Secondary | ICD-10-CM | POA: Diagnosis not present

## 2018-05-05 DIAGNOSIS — D509 Iron deficiency anemia, unspecified: Secondary | ICD-10-CM | POA: Diagnosis not present

## 2018-05-07 DIAGNOSIS — N186 End stage renal disease: Secondary | ICD-10-CM | POA: Diagnosis not present

## 2018-05-07 DIAGNOSIS — Z992 Dependence on renal dialysis: Secondary | ICD-10-CM | POA: Diagnosis not present

## 2018-05-07 DIAGNOSIS — N2581 Secondary hyperparathyroidism of renal origin: Secondary | ICD-10-CM | POA: Diagnosis not present

## 2018-05-07 DIAGNOSIS — D509 Iron deficiency anemia, unspecified: Secondary | ICD-10-CM | POA: Diagnosis not present

## 2018-05-07 DIAGNOSIS — D631 Anemia in chronic kidney disease: Secondary | ICD-10-CM | POA: Diagnosis not present

## 2018-05-09 DIAGNOSIS — N2581 Secondary hyperparathyroidism of renal origin: Secondary | ICD-10-CM | POA: Diagnosis not present

## 2018-05-09 DIAGNOSIS — N186 End stage renal disease: Secondary | ICD-10-CM | POA: Diagnosis not present

## 2018-05-09 DIAGNOSIS — Z992 Dependence on renal dialysis: Secondary | ICD-10-CM | POA: Diagnosis not present

## 2018-05-09 DIAGNOSIS — D631 Anemia in chronic kidney disease: Secondary | ICD-10-CM | POA: Diagnosis not present

## 2018-05-09 DIAGNOSIS — D509 Iron deficiency anemia, unspecified: Secondary | ICD-10-CM | POA: Diagnosis not present

## 2018-05-12 DIAGNOSIS — D631 Anemia in chronic kidney disease: Secondary | ICD-10-CM | POA: Diagnosis not present

## 2018-05-12 DIAGNOSIS — Z992 Dependence on renal dialysis: Secondary | ICD-10-CM | POA: Diagnosis not present

## 2018-05-12 DIAGNOSIS — N2581 Secondary hyperparathyroidism of renal origin: Secondary | ICD-10-CM | POA: Diagnosis not present

## 2018-05-12 DIAGNOSIS — N186 End stage renal disease: Secondary | ICD-10-CM | POA: Diagnosis not present

## 2018-05-12 DIAGNOSIS — D509 Iron deficiency anemia, unspecified: Secondary | ICD-10-CM | POA: Diagnosis not present

## 2018-05-13 DIAGNOSIS — N2581 Secondary hyperparathyroidism of renal origin: Secondary | ICD-10-CM | POA: Diagnosis not present

## 2018-05-13 DIAGNOSIS — D509 Iron deficiency anemia, unspecified: Secondary | ICD-10-CM | POA: Diagnosis not present

## 2018-05-13 DIAGNOSIS — Z23 Encounter for immunization: Secondary | ICD-10-CM | POA: Diagnosis not present

## 2018-05-13 DIAGNOSIS — Z992 Dependence on renal dialysis: Secondary | ICD-10-CM | POA: Diagnosis not present

## 2018-05-13 DIAGNOSIS — N186 End stage renal disease: Secondary | ICD-10-CM | POA: Diagnosis not present

## 2018-05-14 DIAGNOSIS — N2581 Secondary hyperparathyroidism of renal origin: Secondary | ICD-10-CM | POA: Diagnosis not present

## 2018-05-14 DIAGNOSIS — Z23 Encounter for immunization: Secondary | ICD-10-CM | POA: Diagnosis not present

## 2018-05-14 DIAGNOSIS — Z992 Dependence on renal dialysis: Secondary | ICD-10-CM | POA: Diagnosis not present

## 2018-05-14 DIAGNOSIS — N186 End stage renal disease: Secondary | ICD-10-CM | POA: Diagnosis not present

## 2018-05-14 DIAGNOSIS — D509 Iron deficiency anemia, unspecified: Secondary | ICD-10-CM | POA: Diagnosis not present

## 2018-05-16 DIAGNOSIS — N186 End stage renal disease: Secondary | ICD-10-CM | POA: Diagnosis not present

## 2018-05-16 DIAGNOSIS — D509 Iron deficiency anemia, unspecified: Secondary | ICD-10-CM | POA: Diagnosis not present

## 2018-05-16 DIAGNOSIS — Z23 Encounter for immunization: Secondary | ICD-10-CM | POA: Diagnosis not present

## 2018-05-16 DIAGNOSIS — N2581 Secondary hyperparathyroidism of renal origin: Secondary | ICD-10-CM | POA: Diagnosis not present

## 2018-05-16 DIAGNOSIS — Z992 Dependence on renal dialysis: Secondary | ICD-10-CM | POA: Diagnosis not present

## 2018-05-19 DIAGNOSIS — D509 Iron deficiency anemia, unspecified: Secondary | ICD-10-CM | POA: Diagnosis not present

## 2018-05-19 DIAGNOSIS — Z992 Dependence on renal dialysis: Secondary | ICD-10-CM | POA: Diagnosis not present

## 2018-05-19 DIAGNOSIS — N186 End stage renal disease: Secondary | ICD-10-CM | POA: Diagnosis not present

## 2018-05-19 DIAGNOSIS — Z23 Encounter for immunization: Secondary | ICD-10-CM | POA: Diagnosis not present

## 2018-05-19 DIAGNOSIS — N2581 Secondary hyperparathyroidism of renal origin: Secondary | ICD-10-CM | POA: Diagnosis not present

## 2018-05-23 DIAGNOSIS — Z23 Encounter for immunization: Secondary | ICD-10-CM | POA: Diagnosis not present

## 2018-05-23 DIAGNOSIS — N186 End stage renal disease: Secondary | ICD-10-CM | POA: Diagnosis not present

## 2018-05-23 DIAGNOSIS — D509 Iron deficiency anemia, unspecified: Secondary | ICD-10-CM | POA: Diagnosis not present

## 2018-05-23 DIAGNOSIS — N2581 Secondary hyperparathyroidism of renal origin: Secondary | ICD-10-CM | POA: Diagnosis not present

## 2018-05-23 DIAGNOSIS — Z992 Dependence on renal dialysis: Secondary | ICD-10-CM | POA: Diagnosis not present

## 2018-05-26 DIAGNOSIS — D509 Iron deficiency anemia, unspecified: Secondary | ICD-10-CM | POA: Diagnosis not present

## 2018-05-26 DIAGNOSIS — N186 End stage renal disease: Secondary | ICD-10-CM | POA: Diagnosis not present

## 2018-05-26 DIAGNOSIS — N2581 Secondary hyperparathyroidism of renal origin: Secondary | ICD-10-CM | POA: Diagnosis not present

## 2018-05-26 DIAGNOSIS — Z992 Dependence on renal dialysis: Secondary | ICD-10-CM | POA: Diagnosis not present

## 2018-05-26 DIAGNOSIS — Z23 Encounter for immunization: Secondary | ICD-10-CM | POA: Diagnosis not present

## 2018-05-28 DIAGNOSIS — Z23 Encounter for immunization: Secondary | ICD-10-CM | POA: Diagnosis not present

## 2018-05-28 DIAGNOSIS — D509 Iron deficiency anemia, unspecified: Secondary | ICD-10-CM | POA: Diagnosis not present

## 2018-05-28 DIAGNOSIS — Z992 Dependence on renal dialysis: Secondary | ICD-10-CM | POA: Diagnosis not present

## 2018-05-28 DIAGNOSIS — N2581 Secondary hyperparathyroidism of renal origin: Secondary | ICD-10-CM | POA: Diagnosis not present

## 2018-05-28 DIAGNOSIS — N186 End stage renal disease: Secondary | ICD-10-CM | POA: Diagnosis not present

## 2018-05-30 DIAGNOSIS — Z23 Encounter for immunization: Secondary | ICD-10-CM | POA: Diagnosis not present

## 2018-05-30 DIAGNOSIS — N2581 Secondary hyperparathyroidism of renal origin: Secondary | ICD-10-CM | POA: Diagnosis not present

## 2018-05-30 DIAGNOSIS — N186 End stage renal disease: Secondary | ICD-10-CM | POA: Diagnosis not present

## 2018-05-30 DIAGNOSIS — Z992 Dependence on renal dialysis: Secondary | ICD-10-CM | POA: Diagnosis not present

## 2018-05-30 DIAGNOSIS — D509 Iron deficiency anemia, unspecified: Secondary | ICD-10-CM | POA: Diagnosis not present

## 2018-06-02 DIAGNOSIS — N186 End stage renal disease: Secondary | ICD-10-CM | POA: Diagnosis not present

## 2018-06-02 DIAGNOSIS — Z992 Dependence on renal dialysis: Secondary | ICD-10-CM | POA: Diagnosis not present

## 2018-06-02 DIAGNOSIS — N2581 Secondary hyperparathyroidism of renal origin: Secondary | ICD-10-CM | POA: Diagnosis not present

## 2018-06-02 DIAGNOSIS — D509 Iron deficiency anemia, unspecified: Secondary | ICD-10-CM | POA: Diagnosis not present

## 2018-06-02 DIAGNOSIS — Z23 Encounter for immunization: Secondary | ICD-10-CM | POA: Diagnosis not present

## 2018-06-04 DIAGNOSIS — D509 Iron deficiency anemia, unspecified: Secondary | ICD-10-CM | POA: Diagnosis not present

## 2018-06-04 DIAGNOSIS — N186 End stage renal disease: Secondary | ICD-10-CM | POA: Diagnosis not present

## 2018-06-04 DIAGNOSIS — Z23 Encounter for immunization: Secondary | ICD-10-CM | POA: Diagnosis not present

## 2018-06-04 DIAGNOSIS — N2581 Secondary hyperparathyroidism of renal origin: Secondary | ICD-10-CM | POA: Diagnosis not present

## 2018-06-04 DIAGNOSIS — Z992 Dependence on renal dialysis: Secondary | ICD-10-CM | POA: Diagnosis not present

## 2018-06-06 DIAGNOSIS — N2581 Secondary hyperparathyroidism of renal origin: Secondary | ICD-10-CM | POA: Diagnosis not present

## 2018-06-06 DIAGNOSIS — N186 End stage renal disease: Secondary | ICD-10-CM | POA: Diagnosis not present

## 2018-06-06 DIAGNOSIS — Z992 Dependence on renal dialysis: Secondary | ICD-10-CM | POA: Diagnosis not present

## 2018-06-06 DIAGNOSIS — D509 Iron deficiency anemia, unspecified: Secondary | ICD-10-CM | POA: Diagnosis not present

## 2018-06-06 DIAGNOSIS — Z23 Encounter for immunization: Secondary | ICD-10-CM | POA: Diagnosis not present

## 2018-06-09 DIAGNOSIS — N186 End stage renal disease: Secondary | ICD-10-CM | POA: Diagnosis not present

## 2018-06-09 DIAGNOSIS — N2581 Secondary hyperparathyroidism of renal origin: Secondary | ICD-10-CM | POA: Diagnosis not present

## 2018-06-09 DIAGNOSIS — D509 Iron deficiency anemia, unspecified: Secondary | ICD-10-CM | POA: Diagnosis not present

## 2018-06-09 DIAGNOSIS — Z23 Encounter for immunization: Secondary | ICD-10-CM | POA: Diagnosis not present

## 2018-06-09 DIAGNOSIS — Z992 Dependence on renal dialysis: Secondary | ICD-10-CM | POA: Diagnosis not present

## 2018-06-11 DIAGNOSIS — N2581 Secondary hyperparathyroidism of renal origin: Secondary | ICD-10-CM | POA: Diagnosis not present

## 2018-06-11 DIAGNOSIS — N186 End stage renal disease: Secondary | ICD-10-CM | POA: Diagnosis not present

## 2018-06-11 DIAGNOSIS — D509 Iron deficiency anemia, unspecified: Secondary | ICD-10-CM | POA: Diagnosis not present

## 2018-06-11 DIAGNOSIS — Z992 Dependence on renal dialysis: Secondary | ICD-10-CM | POA: Diagnosis not present

## 2018-06-11 DIAGNOSIS — Z23 Encounter for immunization: Secondary | ICD-10-CM | POA: Diagnosis not present

## 2018-06-12 DIAGNOSIS — N186 End stage renal disease: Secondary | ICD-10-CM | POA: Diagnosis not present

## 2018-06-12 DIAGNOSIS — Z992 Dependence on renal dialysis: Secondary | ICD-10-CM | POA: Diagnosis not present

## 2018-06-13 ENCOUNTER — Emergency Department (HOSPITAL_COMMUNITY)
Admission: EM | Admit: 2018-06-13 | Discharge: 2018-06-13 | Disposition: A | Discharge status: Home / Self Care | Payer: Medicare Other | Attending: Emergency Medicine | Admitting: Emergency Medicine

## 2018-06-13 ENCOUNTER — Encounter (HOSPITAL_COMMUNITY): Payer: Self-pay | Admitting: Emergency Medicine

## 2018-06-13 ENCOUNTER — Other Ambulatory Visit: Payer: Self-pay

## 2018-06-13 DIAGNOSIS — I509 Heart failure, unspecified: Secondary | ICD-10-CM | POA: Diagnosis not present

## 2018-06-13 DIAGNOSIS — M545 Low back pain, unspecified: Secondary | ICD-10-CM

## 2018-06-13 DIAGNOSIS — E1122 Type 2 diabetes mellitus with diabetic chronic kidney disease: Secondary | ICD-10-CM | POA: Diagnosis not present

## 2018-06-13 DIAGNOSIS — Z79899 Other long term (current) drug therapy: Secondary | ICD-10-CM | POA: Diagnosis not present

## 2018-06-13 DIAGNOSIS — W01198A Fall on same level from slipping, tripping and stumbling with subsequent striking against other object, initial encounter: Secondary | ICD-10-CM | POA: Insufficient documentation

## 2018-06-13 DIAGNOSIS — Z87891 Personal history of nicotine dependence: Secondary | ICD-10-CM | POA: Diagnosis not present

## 2018-06-13 DIAGNOSIS — I132 Hypertensive heart and chronic kidney disease with heart failure and with stage 5 chronic kidney disease, or end stage renal disease: Secondary | ICD-10-CM | POA: Diagnosis not present

## 2018-06-13 DIAGNOSIS — N186 End stage renal disease: Secondary | ICD-10-CM | POA: Insufficient documentation

## 2018-06-13 DIAGNOSIS — I252 Old myocardial infarction: Secondary | ICD-10-CM | POA: Insufficient documentation

## 2018-06-13 HISTORY — DX: Dorsalgia, unspecified: M54.9

## 2018-06-13 MED ORDER — OXYCODONE-ACETAMINOPHEN 5-325 MG PO TABS
1.0000 | ORAL_TABLET | Freq: Once | ORAL | Status: AC
  Administered 2018-06-13: 1 via ORAL
  Filled 2018-06-13: qty 1

## 2018-06-13 MED ORDER — METHOCARBAMOL 500 MG PO TABS: 500.0000 mg | ORAL_TABLET | Freq: Three times a day (TID) | ORAL | 0 refills | Status: DC | PRN

## 2018-06-13 MED ORDER — LIDOCAINE 5 % EX PTCH: 1.0000 | MEDICATED_PATCH | CUTANEOUS | 0 refills | Status: DC

## 2018-06-13 NOTE — ED Notes (Signed)
Pt states pain moves into different spots depending on position. Pt is a dialysis patient so cannot obtain urine
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
## 2018-06-13 NOTE — Discharge Instructions (Addendum)
Back Pain:  You were seen in the ER today for back pain.   We are sending you home with 2 medications:  - Lidoderm patches: these are topical patches which you may place 1 directly over area of pain to help soothe the muscle.   - Robaxin is the muscle relaxer I have prescribed, this is meant to help with muscle tightness. Be aware that this medication may make you drowsy therefore the first time you take this it should be at a time you are in an environment where you can rest. Do not drive or operate heavy machinery when taking this medication. Do not drink alcohol or take other sedating medications with this medicine such as narcotics or benzodiazepines.   You make take Tylenol per over the counter dosing with these medications.   We have prescribed you new medication(s) today. Discuss the medications prescribed today with your pharmacist as they can have adverse effects and interactions with your other medicines including over the counter and prescribed medications. Seek medical evaluation if you start to experience new or abnormal symptoms after taking one of these medicines, seek care immediately if you start to experience difficulty breathing, feeling of your throat closing, facial swelling, or rash as these could be indications of a more serious allergic reaction  Your pain should get better over the next 2 weeks.  You will need to follow up with  Your primary healthcare provider within 1 week, if you do not have a primary care provider one is provided in your discharge instructions. However if you develop severe or worsening pain, low back pain with fever, numbness, weakness, loss of bowel or bladder control, or inability to walk or urinate, you should return to the ER immediately.  Please follow up with your doctor this week for a recheck if still having symptoms.  Additionally your blood pressure was elevated in the emergency department today, have this rechecked by your primary care provider  your follow-up appointment.
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
## 2018-06-14 ENCOUNTER — Other Ambulatory Visit: Payer: Self-pay

## 2018-06-14 ENCOUNTER — Emergency Department (HOSPITAL_COMMUNITY): Payer: Medicare Other

## 2018-06-14 ENCOUNTER — Encounter (HOSPITAL_COMMUNITY): Payer: Self-pay | Admitting: Emergency Medicine

## 2018-06-14 ENCOUNTER — Emergency Department (HOSPITAL_COMMUNITY)
Admission: EM | Admit: 2018-06-14 | Discharge: 2018-06-14 | Disposition: A | Discharge status: Home / Self Care | Payer: Medicare Other | Attending: Emergency Medicine | Admitting: Emergency Medicine

## 2018-06-14 DIAGNOSIS — E1122 Type 2 diabetes mellitus with diabetic chronic kidney disease: Secondary | ICD-10-CM | POA: Insufficient documentation

## 2018-06-14 DIAGNOSIS — N186 End stage renal disease: Secondary | ICD-10-CM | POA: Diagnosis not present

## 2018-06-14 DIAGNOSIS — S299XXA Unspecified injury of thorax, initial encounter: Secondary | ICD-10-CM | POA: Diagnosis not present

## 2018-06-14 DIAGNOSIS — M549 Dorsalgia, unspecified: Secondary | ICD-10-CM | POA: Diagnosis not present

## 2018-06-14 DIAGNOSIS — S3992XA Unspecified injury of lower back, initial encounter: Secondary | ICD-10-CM | POA: Diagnosis not present

## 2018-06-14 DIAGNOSIS — I509 Heart failure, unspecified: Secondary | ICD-10-CM | POA: Diagnosis not present

## 2018-06-14 DIAGNOSIS — Z87891 Personal history of nicotine dependence: Secondary | ICD-10-CM | POA: Diagnosis not present

## 2018-06-14 DIAGNOSIS — M545 Low back pain, unspecified: Secondary | ICD-10-CM

## 2018-06-14 DIAGNOSIS — E785 Hyperlipidemia, unspecified: Secondary | ICD-10-CM | POA: Diagnosis not present

## 2018-06-14 DIAGNOSIS — R0602 Shortness of breath: Secondary | ICD-10-CM | POA: Diagnosis not present

## 2018-06-14 DIAGNOSIS — I132 Hypertensive heart and chronic kidney disease with heart failure and with stage 5 chronic kidney disease, or end stage renal disease: Secondary | ICD-10-CM | POA: Insufficient documentation

## 2018-06-14 DIAGNOSIS — I252 Old myocardial infarction: Secondary | ICD-10-CM | POA: Diagnosis not present

## 2018-06-14 DIAGNOSIS — Z992 Dependence on renal dialysis: Secondary | ICD-10-CM | POA: Diagnosis not present

## 2018-06-14 MED ORDER — HYDROCODONE-ACETAMINOPHEN 5-325 MG PO TABS
2.0000 | ORAL_TABLET | Freq: Once | ORAL | Status: AC
  Administered 2018-06-14: 2 via ORAL
  Filled 2018-06-14: qty 2

## 2018-06-14 MED ORDER — HYDROCODONE-ACETAMINOPHEN 5-325 MG PO TABS: 1.0000 | ORAL_TABLET | Freq: Four times a day (QID) | ORAL | 0 refills | Status: DC | PRN

## 2018-06-14 MED ORDER — DEXAMETHASONE SODIUM PHOSPHATE 4 MG/ML IJ SOLN
10.0000 mg | Freq: Once | INTRAMUSCULAR | Status: AC
  Administered 2018-06-14: 10 mg via INTRAMUSCULAR
  Filled 2018-06-14: qty 3

## 2018-06-14 NOTE — ED Triage Notes (Signed)
Pt with hx of back pain  Seen here yesterday  States meds have not helped here for pain relief

## 2018-06-14 NOTE — Discharge Instructions (Signed)
You have been seen in the Emergency Department (ED)  today for back pain.  Your workup and exam have not shown any acute abnormalities and you are likely suffering from muscle strain or possible problems with your discs, but there is no treatment that will fix your symptoms at this time.  Please take Motrin (ibuprofen) as needed for your pain according to the instructions written on the box.  Alternatively, for the next five days you can take 600mg  three times daily with meals (it may upset your stomach).  Take Vicodin as prescribed for severe pain. Do not drink alcohol, drive or participate in any other potentially dangerous activities while taking this medication as it may make you sleepy. Do not take this medication with any other sedating medications, either prescription or over-the-counter. If you were prescribed Percocet or Vicodin, do not take these with acetaminophen (Tylenol) as it is already contained within these medications.   This medication is an opiate (or narcotic) pain medication and can be habit forming.  Use it as little as possible to achieve adequate pain control.  Do not use or use it with extreme caution if you have a history of opiate abuse or dependence. This medication is intended for your use only - do not give any to anyone else and keep it in a secure place where nobody else, especially children, have access to it.  It will also cause or worsen constipation, so you may want to consider taking an over-the-counter stool softener while you are taking this medication.  Please follow up with your doctor as soon as possible regarding today's ED visit and your back pain.  Return to the ED for worsening back pain, fever, weakness or numbness of either leg, or if you develop either (1) an inability to urinate or have bowel movements, or (2) loss of your ability to control your bathroom functions (if you start having "accidents"), or if you develop other new symptoms that concern you.
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
## 2018-06-16 DIAGNOSIS — D631 Anemia in chronic kidney disease: Secondary | ICD-10-CM | POA: Diagnosis not present

## 2018-06-16 DIAGNOSIS — N2581 Secondary hyperparathyroidism of renal origin: Secondary | ICD-10-CM | POA: Diagnosis not present

## 2018-06-16 DIAGNOSIS — N186 End stage renal disease: Secondary | ICD-10-CM | POA: Diagnosis not present

## 2018-06-16 DIAGNOSIS — Z992 Dependence on renal dialysis: Secondary | ICD-10-CM | POA: Diagnosis not present

## 2018-06-16 DIAGNOSIS — D509 Iron deficiency anemia, unspecified: Secondary | ICD-10-CM | POA: Diagnosis not present

## 2018-06-18 DIAGNOSIS — Z992 Dependence on renal dialysis: Secondary | ICD-10-CM | POA: Diagnosis not present

## 2018-06-18 DIAGNOSIS — D631 Anemia in chronic kidney disease: Secondary | ICD-10-CM | POA: Diagnosis not present

## 2018-06-18 DIAGNOSIS — N2581 Secondary hyperparathyroidism of renal origin: Secondary | ICD-10-CM | POA: Diagnosis not present

## 2018-06-18 DIAGNOSIS — D509 Iron deficiency anemia, unspecified: Secondary | ICD-10-CM | POA: Diagnosis not present

## 2018-06-18 DIAGNOSIS — N186 End stage renal disease: Secondary | ICD-10-CM | POA: Diagnosis not present

## 2018-06-20 DIAGNOSIS — N186 End stage renal disease: Secondary | ICD-10-CM | POA: Diagnosis not present

## 2018-06-20 DIAGNOSIS — D631 Anemia in chronic kidney disease: Secondary | ICD-10-CM | POA: Diagnosis not present

## 2018-06-20 DIAGNOSIS — M48061 Spinal stenosis, lumbar region without neurogenic claudication: Secondary | ICD-10-CM | POA: Diagnosis not present

## 2018-06-20 DIAGNOSIS — M545 Low back pain: Secondary | ICD-10-CM | POA: Diagnosis not present

## 2018-06-20 DIAGNOSIS — S3992XA Unspecified injury of lower back, initial encounter: Secondary | ICD-10-CM | POA: Diagnosis not present

## 2018-06-20 DIAGNOSIS — I509 Heart failure, unspecified: Secondary | ICD-10-CM | POA: Diagnosis not present

## 2018-06-20 DIAGNOSIS — D509 Iron deficiency anemia, unspecified: Secondary | ICD-10-CM | POA: Diagnosis not present

## 2018-06-20 DIAGNOSIS — N189 Chronic kidney disease, unspecified: Secondary | ICD-10-CM | POA: Diagnosis not present

## 2018-06-20 DIAGNOSIS — Z79899 Other long term (current) drug therapy: Secondary | ICD-10-CM | POA: Diagnosis not present

## 2018-06-20 DIAGNOSIS — I130 Hypertensive heart and chronic kidney disease with heart failure and stage 1 through stage 4 chronic kidney disease, or unspecified chronic kidney disease: Secondary | ICD-10-CM | POA: Diagnosis not present

## 2018-06-20 DIAGNOSIS — Z992 Dependence on renal dialysis: Secondary | ICD-10-CM | POA: Diagnosis not present

## 2018-06-20 DIAGNOSIS — M5416 Radiculopathy, lumbar region: Secondary | ICD-10-CM | POA: Diagnosis not present

## 2018-06-20 DIAGNOSIS — N2581 Secondary hyperparathyroidism of renal origin: Secondary | ICD-10-CM | POA: Diagnosis not present

## 2018-06-20 DIAGNOSIS — Z87891 Personal history of nicotine dependence: Secondary | ICD-10-CM | POA: Diagnosis not present

## 2018-06-20 DIAGNOSIS — E1122 Type 2 diabetes mellitus with diabetic chronic kidney disease: Secondary | ICD-10-CM | POA: Diagnosis not present

## 2018-06-20 DIAGNOSIS — M549 Dorsalgia, unspecified: Secondary | ICD-10-CM | POA: Diagnosis not present

## 2018-06-25 ENCOUNTER — Other Ambulatory Visit: Payer: Self-pay

## 2018-06-25 ENCOUNTER — Encounter (HOSPITAL_COMMUNITY): Payer: Self-pay | Admitting: Emergency Medicine

## 2018-06-25 ENCOUNTER — Emergency Department (HOSPITAL_COMMUNITY): Payer: Medicare Other

## 2018-06-25 ENCOUNTER — Observation Stay (HOSPITAL_COMMUNITY)
Admission: EM | Admit: 2018-06-25 | Discharge: 2018-06-26 | Disposition: A | Discharge status: Home / Self Care | Payer: Medicare Other | Attending: Internal Medicine | Admitting: Internal Medicine

## 2018-06-25 DIAGNOSIS — Z9115 Patient's noncompliance with renal dialysis: Secondary | ICD-10-CM

## 2018-06-25 DIAGNOSIS — N189 Chronic kidney disease, unspecified: Secondary | ICD-10-CM | POA: Diagnosis not present

## 2018-06-25 DIAGNOSIS — J984 Other disorders of lung: Secondary | ICD-10-CM | POA: Diagnosis not present

## 2018-06-25 DIAGNOSIS — N186 End stage renal disease: Secondary | ICD-10-CM | POA: Insufficient documentation

## 2018-06-25 DIAGNOSIS — Z992 Dependence on renal dialysis: Secondary | ICD-10-CM | POA: Insufficient documentation

## 2018-06-25 DIAGNOSIS — M545 Low back pain: Secondary | ICD-10-CM

## 2018-06-25 DIAGNOSIS — E1169 Type 2 diabetes mellitus with other specified complication: Secondary | ICD-10-CM | POA: Diagnosis not present

## 2018-06-25 DIAGNOSIS — E119 Type 2 diabetes mellitus without complications: Secondary | ICD-10-CM | POA: Insufficient documentation

## 2018-06-25 DIAGNOSIS — G8929 Other chronic pain: Secondary | ICD-10-CM | POA: Diagnosis present

## 2018-06-25 DIAGNOSIS — E8779 Other fluid overload: Secondary | ICD-10-CM

## 2018-06-25 DIAGNOSIS — Z79899 Other long term (current) drug therapy: Secondary | ICD-10-CM | POA: Diagnosis not present

## 2018-06-25 DIAGNOSIS — N185 Chronic kidney disease, stage 5: Secondary | ICD-10-CM

## 2018-06-25 DIAGNOSIS — Z7982 Long term (current) use of aspirin: Secondary | ICD-10-CM | POA: Diagnosis not present

## 2018-06-25 DIAGNOSIS — R1084 Generalized abdominal pain: Secondary | ICD-10-CM | POA: Diagnosis not present

## 2018-06-25 DIAGNOSIS — M549 Dorsalgia, unspecified: Secondary | ICD-10-CM | POA: Diagnosis not present

## 2018-06-25 DIAGNOSIS — I132 Hypertensive heart and chronic kidney disease with heart failure and with stage 5 chronic kidney disease, or end stage renal disease: Secondary | ICD-10-CM | POA: Insufficient documentation

## 2018-06-25 DIAGNOSIS — I5032 Chronic diastolic (congestive) heart failure: Secondary | ICD-10-CM | POA: Diagnosis not present

## 2018-06-25 DIAGNOSIS — K219 Gastro-esophageal reflux disease without esophagitis: Secondary | ICD-10-CM

## 2018-06-25 DIAGNOSIS — E875 Hyperkalemia: Principal | ICD-10-CM | POA: Insufficient documentation

## 2018-06-25 DIAGNOSIS — D631 Anemia in chronic kidney disease: Secondary | ICD-10-CM | POA: Diagnosis not present

## 2018-06-25 DIAGNOSIS — N200 Calculus of kidney: Secondary | ICD-10-CM | POA: Diagnosis not present

## 2018-06-25 DIAGNOSIS — E6609 Other obesity due to excess calories: Secondary | ICD-10-CM | POA: Diagnosis present

## 2018-06-25 DIAGNOSIS — I10 Essential (primary) hypertension: Secondary | ICD-10-CM | POA: Diagnosis present

## 2018-06-25 DIAGNOSIS — Z6832 Body mass index (BMI) 32.0-32.9, adult: Secondary | ICD-10-CM | POA: Diagnosis not present

## 2018-06-25 DIAGNOSIS — Z87891 Personal history of nicotine dependence: Secondary | ICD-10-CM | POA: Insufficient documentation

## 2018-06-25 DIAGNOSIS — E8770 Fluid overload, unspecified: Secondary | ICD-10-CM | POA: Diagnosis not present

## 2018-06-25 DIAGNOSIS — I120 Hypertensive chronic kidney disease with stage 5 chronic kidney disease or end stage renal disease: Secondary | ICD-10-CM | POA: Diagnosis not present

## 2018-06-25 LAB — CBC WITH DIFFERENTIAL/PLATELET
Abs Immature Granulocytes: 0.04 10*3/uL (ref 0.00–0.07)
BASOS ABS: 0 10*3/uL (ref 0.0–0.1)
Basophils Relative: 0 %
EOS PCT: 2 %
Eosinophils Absolute: 0.2 10*3/uL (ref 0.0–0.5)
HCT: 28.9 % — ABNORMAL LOW (ref 39.0–52.0)
HEMOGLOBIN: 8.8 g/dL — AB (ref 13.0–17.0)
Immature Granulocytes: 1 %
LYMPHS PCT: 10 %
Lymphs Abs: 0.9 10*3/uL (ref 0.7–4.0)
MCH: 24.5 pg — ABNORMAL LOW (ref 26.0–34.0)
MCHC: 30.4 g/dL (ref 30.0–36.0)
MCV: 80.5 fL (ref 80.0–100.0)
MONO ABS: 0.7 10*3/uL (ref 0.1–1.0)
Monocytes Relative: 8 %
NRBC: 0 % (ref 0.0–0.2)
Neutro Abs: 6.8 10*3/uL (ref 1.7–7.7)
Neutrophils Relative %: 79 %
Platelets: 158 10*3/uL (ref 150–400)
RBC: 3.59 MIL/uL — ABNORMAL LOW (ref 4.22–5.81)
RDW: 15.6 % — ABNORMAL HIGH (ref 11.5–15.5)
WBC: 8.6 10*3/uL (ref 4.0–10.5)

## 2018-06-25 LAB — I-STAT CHEM 8, ED
BUN: >140 mg/dL — ABNORMAL HIGH (ref 6–20)
CALCIUM ION: 1.11 mmol/L — AB (ref 1.15–1.40)
Chloride: 97 mmol/L — ABNORMAL LOW (ref 98–111)
Creatinine, Ser: >18 mg/dL — ABNORMAL HIGH (ref 0.61–1.24)
GLUCOSE: 161 mg/dL — AB (ref 70–99)
HEMATOCRIT: 28 % — AB (ref 39.0–52.0)
HEMOGLOBIN: 9.5 g/dL — AB (ref 13.0–17.0)
Potassium: 7 mmol/L (ref 3.5–5.1)
Sodium: 133 mmol/L — ABNORMAL LOW (ref 135–145)
TCO2: 24 mmol/L (ref 22–32)

## 2018-06-25 LAB — COMPREHENSIVE METABOLIC PANEL
ALBUMIN: 3.8 g/dL (ref 3.5–5.0)
ALK PHOS: 129 U/L — AB (ref 38–126)
ALT: 31 U/L (ref 0–44)
AST: 14 U/L — AB (ref 15–41)
Anion gap: 19 — ABNORMAL HIGH (ref 5–15)
BILIRUBIN TOTAL: 1.5 mg/dL — AB (ref 0.3–1.2)
BUN: 158 mg/dL — AB (ref 6–20)
CO2: 22 mmol/L (ref 22–32)
CREATININE: 19.49 mg/dL — AB (ref 0.61–1.24)
Calcium: 9.3 mg/dL (ref 8.9–10.3)
Chloride: 94 mmol/L — ABNORMAL LOW (ref 98–111)
GFR calc Af Amer: 3 mL/min — ABNORMAL LOW (ref >60)
GFR, EST NON AFRICAN AMERICAN: 2 mL/min — AB (ref >60)
GLUCOSE: 169 mg/dL — AB (ref 70–99)
Potassium: 7.1 mmol/L (ref 3.5–5.1)
Sodium: 135 mmol/L (ref 135–145)
TOTAL PROTEIN: 7.2 g/dL (ref 6.5–8.1)

## 2018-06-25 LAB — GLUCOSE, CAPILLARY: Glucose-Capillary: 100 mg/dL — ABNORMAL HIGH (ref 70–99)

## 2018-06-25 LAB — TROPONIN I: Troponin I: 0.07 ng/mL (ref <0.03)

## 2018-06-25 LAB — PROTIME-INR
INR: 1.19
Prothrombin Time: 15 seconds (ref 11.4–15.2)

## 2018-06-25 LAB — LIPASE, BLOOD: LIPASE: 70 U/L — AB (ref 11–51)

## 2018-06-25 LAB — CBG MONITORING, ED
Glucose-Capillary: 135 mg/dL — ABNORMAL HIGH (ref 70–99)
Glucose-Capillary: 192 mg/dL — ABNORMAL HIGH (ref 70–99)

## 2018-06-25 LAB — PHOSPHORUS: Phosphorus: 3.8 mg/dL (ref 2.5–4.6)

## 2018-06-25 MED ORDER — LIDOCAINE HCL (PF) 1 % IJ SOLN: 5.0000 mL | INTRAMUSCULAR | Status: DC | PRN

## 2018-06-25 MED ORDER — SODIUM CHLORIDE 0.9 % IV SOLN: 100.0000 mL | INTRAVENOUS | Status: DC | PRN

## 2018-06-25 MED ORDER — HEPARIN SODIUM (PORCINE) 5000 UNIT/ML IJ SOLN
5000.0000 [IU] | Freq: Three times a day (TID) | INTRAMUSCULAR | Status: DC
  Administered 2018-06-25 – 2018-06-26 (×2): 5000 [IU] via SUBCUTANEOUS
  Filled 2018-06-25 (×2): qty 1

## 2018-06-25 MED ORDER — EPOETIN ALFA 3000 UNIT/ML IJ SOLN
3000.0000 [IU] | INTRAMUSCULAR | Status: DC
  Administered 2018-06-25: 3000 [IU] via SUBCUTANEOUS
  Filled 2018-06-25 (×2): qty 1

## 2018-06-25 MED ORDER — PENTAFLUOROPROP-TETRAFLUOROETH EX AERO: 1.0000 "application " | INHALATION_SPRAY | CUTANEOUS | Status: DC | PRN

## 2018-06-25 MED ORDER — AMLODIPINE BESYLATE 5 MG PO TABS
5.0000 mg | ORAL_TABLET | Freq: Every day | ORAL | Status: DC
  Administered 2018-06-26 (×2): 5 mg via ORAL
  Filled 2018-06-25: qty 1

## 2018-06-25 MED ORDER — SODIUM POLYSTYRENE SULFONATE 15 GM/60ML PO SUSP
30.0000 g | Freq: Once | ORAL | Status: AC
  Administered 2018-06-25: 30 g via ORAL
  Filled 2018-06-25: qty 120

## 2018-06-25 MED ORDER — LIDOCAINE-PRILOCAINE 2.5-2.5 % EX CREA: 1.0000 "application " | TOPICAL_CREAM | CUTANEOUS | Status: DC | PRN

## 2018-06-25 MED ORDER — SODIUM CHLORIDE 0.9 % IV SOLN: 250.0000 mL | INTRAVENOUS | Status: DC | PRN

## 2018-06-25 MED ORDER — DIAZEPAM 2 MG PO TABS: 2.0000 mg | ORAL_TABLET | Freq: Three times a day (TID) | ORAL | Status: DC | PRN

## 2018-06-25 MED ORDER — SODIUM CHLORIDE 0.9 % IV SOLN
INTRAVENOUS | Status: DC | PRN
  Administered 2018-06-25: 500 mL via INTRAVENOUS

## 2018-06-25 MED ORDER — CHLORHEXIDINE GLUCONATE CLOTH 2 % EX PADS
6.0000 | MEDICATED_PAD | Freq: Every day | CUTANEOUS | Status: DC
  Administered 2018-06-25 – 2018-06-26 (×2): 6 via TOPICAL

## 2018-06-25 MED ORDER — DEXTROSE 50 % IV SOLN
INTRAVENOUS | Status: AC
  Filled 2018-06-25: qty 50

## 2018-06-25 MED ORDER — CALCIUM ACETATE (PHOS BINDER) 667 MG PO CAPS
2001.0000 mg | ORAL_CAPSULE | Freq: Three times a day (TID) | ORAL | Status: DC
  Administered 2018-06-26 (×2): 2001 mg via ORAL
  Filled 2018-06-25 (×2): qty 3

## 2018-06-25 MED ORDER — ONDANSETRON HCL 4 MG/2ML IJ SOLN: 4.0000 mg | Freq: Four times a day (QID) | INTRAMUSCULAR | Status: DC | PRN

## 2018-06-25 MED ORDER — SODIUM CHLORIDE 0.9% FLUSH: 3.0000 mL | INTRAVENOUS | Status: DC | PRN

## 2018-06-25 MED ORDER — INSULIN ASPART 100 UNIT/ML ~~LOC~~ SOLN
0.0000 [IU] | Freq: Three times a day (TID) | SUBCUTANEOUS | Status: DC
  Administered 2018-06-26: 3 [IU] via SUBCUTANEOUS

## 2018-06-25 MED ORDER — INSULIN DETEMIR 100 UNIT/ML ~~LOC~~ SOLN
10.0000 [IU] | Freq: Every day | SUBCUTANEOUS | Status: DC
  Administered 2018-06-25: 10 [IU] via SUBCUTANEOUS
  Filled 2018-06-25 (×2): qty 0.1

## 2018-06-25 MED ORDER — ONDANSETRON HCL 4 MG PO TABS: 4.0000 mg | ORAL_TABLET | Freq: Four times a day (QID) | ORAL | Status: DC | PRN

## 2018-06-25 MED ORDER — BISACODYL 5 MG PO TBEC: 5.0000 mg | DELAYED_RELEASE_TABLET | Freq: Every day | ORAL | Status: DC | PRN

## 2018-06-25 MED ORDER — SODIUM CHLORIDE 0.9% FLUSH
3.0000 mL | Freq: Two times a day (BID) | INTRAVENOUS | Status: DC
  Administered 2018-06-25 – 2018-06-26 (×2): 3 mL via INTRAVENOUS

## 2018-06-25 MED ORDER — ACETAMINOPHEN 325 MG PO TABS: 650.0000 mg | ORAL_TABLET | Freq: Four times a day (QID) | ORAL | Status: DC | PRN

## 2018-06-25 MED ORDER — INSULIN ASPART 100 UNIT/ML IV SOLN
5.0000 [IU] | Freq: Once | INTRAVENOUS | Status: AC
  Administered 2018-06-25: 5 [IU] via INTRAVENOUS

## 2018-06-25 MED ORDER — ALBUTEROL SULFATE (2.5 MG/3ML) 0.083% IN NEBU
10.0000 mg | INHALATION_SOLUTION | Freq: Once | RESPIRATORY_TRACT | Status: AC
  Administered 2018-06-25: 10 mg via RESPIRATORY_TRACT
  Filled 2018-06-25: qty 12

## 2018-06-25 MED ORDER — PANTOPRAZOLE SODIUM 40 MG PO TBEC
40.0000 mg | DELAYED_RELEASE_TABLET | Freq: Every day | ORAL | Status: DC
  Administered 2018-06-26: 40 mg via ORAL
  Filled 2018-06-25: qty 1

## 2018-06-25 MED ORDER — HEPARIN SODIUM (PORCINE) 1000 UNIT/ML DIALYSIS
20.0000 [IU]/kg | INTRAMUSCULAR | Status: DC | PRN
  Filled 2018-06-25: qty 2

## 2018-06-25 MED ORDER — DEXTROSE 50 % IV SOLN
1.0000 | Freq: Once | INTRAVENOUS | Status: AC
  Administered 2018-06-25: 50 mL via INTRAVENOUS

## 2018-06-25 MED ORDER — CALCIUM GLUCONATE-NACL 1-0.675 GM/50ML-% IV SOLN
1.0000 g | Freq: Once | INTRAVENOUS | Status: AC
  Administered 2018-06-25: 1000 mg via INTRAVENOUS
  Filled 2018-06-25: qty 50

## 2018-06-25 MED ORDER — INSULIN ASPART 100 UNIT/ML ~~LOC~~ SOLN
SUBCUTANEOUS | Status: AC
  Filled 2018-06-25: qty 1

## 2018-06-25 NOTE — ED Triage Notes (Signed)
Pt has not had dialysis for one week. (missed 3 days of dialysis) due to having too much pain per wife. Pt here for abd pain. Reports has had constipation for past 4 days with reporting of increased problems with bowel movements for one week.

## 2018-06-25 NOTE — H&P (Signed)
History and Physical    Alejandro Lee:[id number] DOB: 12/07/1960 DOA: 06/25/2018  Referring MD/NP/PA: Dr. Thurnell Garbe PCP: Alejandro Macadam, MD  Patient coming from: Home  Chief Complaint: Abdominal pain  HPI: Alejandro Lee is a 57 y.o. male who comes to the ED complaining of abdominal pain for the past 3 weeks associated with constipation for the past 4 days and blurry vision for the past 2 days. The patient has a past medical history of diabetes mellitus type 2 with nephropathy, hypertension, diastolic CHF, ESRD on dialysis m/w/f, GERD, and prior hx of MI.  The patient describes the pain as an aching pain in the bilateral flanks, 8/10 in intensity, radiating to the back. He states that in the past he has been non compliant with dialysis because he used to have difficulty finding transportation to his dialysis appointments. He also states that he had been compliant for the past few years, but he has skipped his last two dialysis appointments due to the pain in his abdomen. He states that he thought the pain would go away if he stayed at his home, but it only increased so he decided to come to the hospital.    He denies fevers, chills, headache, neck pain, nausea, vomiting, coughing, shortness of breath, palpitations, chest pain, diarrhea, hematochezia, hematemesis, melena, hematuria, dysuria, muscle weakness and paresthesia. Temp- 98.1 F, Sat O2- 98% on RA, RR- 16, Bp- 185/90, HR- 81.  In ED, CT abd and pelvis failed to reveal any acute intraabdominal abnormalities; but patient blood work demonstrated severe hyperkalemia (7.1) and Cr of almost 20. Calcium gluconate and kayexalate given; nephrology acutely consult to start HD. TR[facility] contacted to place patient in observation and assist with further care and evaluation.  Past Medical/Surgical History: Past Medical History:  Diagnosis Date  . Arthritis   . Back pain   . CHF (congestive heart failure) ([place])   . Chronic kidney disease    dialysis M/W/F    . Diabetes mellitus without complication ([place])   . GERD (gastroesophageal reflux disease)   . Headache    migraines  . Hyperlipidemia   . Hypertension   . Myocardial infarction [facility])    patient states it was seen on EKG, denies a cardiac cath    Past Surgical History:  Procedure Laterality Date  . AV FISTULA PLACEMENT Right   . diabetic cyst removal Bilateral    on buttocks x8  . EYE SURGERY    . FISTULA SUPERFICIALIZATION Right 0/24/0973   Procedure: PLICATION OF ARTERIOVENOUS FISTULA RIGHT ARM;  Surgeon: Alejandro Bucks, MD;  Location: [place];  Service: Vascular;  Laterality: Right;  . lens replacement Left   . REFRACTIVE SURGERY Bilateral     Social History:  reports that he has quit smoking. His smoking use included cigarettes. He has never used smokeless tobacco. He reports that he drank alcohol. He reports that he does not use drugs.  Allergies: No Known Allergies  Family History:  Family History  Problem Relation Age of Onset  . Diabetes Mother   . Hypertension Mother   . Diabetes Father   . Heart disease Father   . Cancer Father   . Hypertension Father   . Diabetes Sister   . Hypertension Sister   . Diabetes Brother   . Hypertension Brother   . Cancer Brother     Prior to Admission medications   Medication Sig Start Date End Date Taking? Authorizing Provider  acetaminophen (TYLENOL) 325 MG tablet Take 650  mg by mouth daily as needed for headache.   Yes [provider]  aspirin-acetaminophen-caffeine (EXCEDRIN MIGRAINE) [phone] MG tablet Take 1 tablet by mouth daily as needed for headache or migraine.   Yes [provider]  bismuth subsalicylate (PEPTO BISMOL) 262 MG/15ML suspension Take 30 mLs by mouth as needed for indigestion or diarrhea or loose stools.   Yes [provider]  calcium acetate (PHOSLO) 667 MG capsule Take 2,001 mg by mouth 3 (three) times daily with meals.    Yes [provider]  diazepam (VALIUM) 5 MG  tablet Take 1 tablet by mouth 3 (three) times daily as needed. 06/20/18  Yes [provider]  lidocaine-prilocaine (EMLA) cream Apply 1 application topically as needed (port acces).   Yes [provider]  magnesium hydroxide (MILK OF MAGNESIA) 400 MG/5ML suspension Take 15 mLs by mouth daily as needed for mild constipation.   Yes [provider]  methocarbamol (ROBAXIN) 500 MG tablet Take 1 tablet (500 mg total) by mouth every 8 (eight) hours as needed for muscle spasms. 06/13/18  Yes Petrucelli, Samantha R, PA-C  predniSONE (DELTASONE) 20 MG tablet Take 1-3 tablets by mouth as directed. Take 1 tablet by mouth three times a day for 4 days, then take 1 tablet by mouth twice a day for 3 days, then take 1 tablet by mouth once a day for 2 days, then stop 06/20/18  Yes [provider]  amLODipine (NORVASC) 5 MG tablet Take 5 mg by mouth daily. 12/23/17   [provider]    Review of Systems:  Negative except as otherwise mentioned on HPI.  Physical Exam: Vitals:   06/25/18 1500 06/25/18 1530 06/25/18 1545 06/25/18 1600  BP: (!) 159/81 (!) 167/88 (!) 170/84 (!) 185/90  Pulse: 85 81 83 81  Resp:      Temp:      TempSrc:      SpO2:      Weight:      Height:        Constitutional: NAD, calm, comfortable, lying in bed and receiving hemodialysis. Reported abd pain improved after having BM. Eyes: PERRL, lids and conjunctivae normal ENMT: Mucous membranes are moist. Posterior pharynx clear of any exudate or lesions.Normal dentition.  Neck: normal, supple, no masses, no thyromegaly Respiratory: clear to auscultation bilaterally, no wheezing, no crackles. Normal respiratory effort. No accessory muscle use.  Cardiovascular: Regular rate and rhythm, no murmurs / rubs / gallops. No extremity edema. 2+ pedal pulses. No carotid bruits.  Abdomen: tenderness in all quadrants specially on bilateral flanks, some swelling noted, no masses palpated. No hepatosplenomegaly.  Bowel sounds positive.  Musculoskeletal: no clubbing / cyanosis. No joint deformity upper and lower extremities. Good ROM, no contractures. Normal muscle tone.  Skin:  Dry skin and induration on bilateral feet. No rashes, lesions, ulcers. Neurologic: CN grossly intact. Sensation intact, DTR normal. Strength 5/5 in all 4.  Psychiatric: Normal judgment and insight. Alert and oriented x 3. Normal mood.    Labs on Admission: I have personally reviewed the following labs and imaging studies  CBC: Recent Labs  Lab 06/25/18 0800 06/25/18 0807  WBC 8.6  --   NEUTROABS 6.8  --   HGB 8.8* 9.5*  HCT 28.9* 28.0*  MCV 80.5  --   PLT 158  --    Basic Metabolic Panel: Recent Labs  Lab 06/25/18 0800 06/25/18 0807  NA 135 133*  K 7.1* 7.0*  CL 94* 97*  CO2 22  --  GLUCOSE 169* 161*  BUN 158* >140*  CREATININE 19.49* >18.00*  CALCIUM 9.3  --    GFR: CrCl cannot be calculated (This lab value cannot be used to calculate CrCl because it is not a number: >18.00).   Liver Function Tests: Recent Labs  Lab 06/25/18 0800  AST 14*  ALT 31  ALKPHOS 129*  BILITOT 1.5*  PROT 7.2  ALBUMIN 3.8   Recent Labs  Lab 06/25/18 0800  LIPASE 70*   Coagulation Profile: Recent Labs  Lab 06/25/18 0800  INR 1.19   Cardiac Enzymes: Recent Labs  Lab 06/25/18 0800  TROPONINI 0.07*   CBG: Recent Labs  Lab 06/25/18 0738 06/25/18 0959  GLUCAP 135* 192*    Radiological Exams on Admission: Ct Abdomen Pelvis Wo Contrast  Result Date: 06/25/2018 CLINICAL DATA:  Bilateral flank pain for 3 weeks, history hypertension, diabetes mellitus, GERD, CHF, chronic kidney disease on dialysis, prior MI EXAM: CT ABDOMEN AND PELVIS WITHOUT CONTRAST TECHNIQUE: Multidetector CT imaging of the abdomen and pelvis was performed following the standard protocol without IV contrast. Sagittal and coronal MPR images reconstructed from axial data set. COMPARISON:  None FINDINGS: Lower chest: Subsegmental atelectasis  at both lung bases Hepatobiliary: Gallbladder and liver normal appearance Pancreas: Normal appearance Spleen: Normal appearance Adrenals/Urinary Tract: Calcifications within RIGHT adrenal gland likely reflect prior hemorrhage or infection. LEFT adrenal gland unremarkable. BILATERAL nonobstructing renal calculi. Tiny exophytic cyst at inferior pole LEFT kidney. Scattered perinephric edema without renal mass or hydronephrosis. Ureters unremarkable. Bladder wall appears thickened though bladder is decompressed question artifact. Stomach/Bowel: Normal appendix. Minimal distal colonic diverticulosis without evidence of diverticulitis. Stomach and bowel loops otherwise normal appearance. Vascular/Lymphatic: Atherosclerotic calcifications aorta, iliac arteries, and proximal LEFT renal artery. Aorta normal caliber. No adenopathy. Reproductive: Mild prostatic enlargement. Other: Small LEFT inguinal hernia containing fat. No free air or free fluid. No definite acute inflammatory process. Musculoskeletal: Multifactorial mild central acquired spinal stenosis L3-L4 and L4-L5. No acute bone lesions. Degenerative disc disease changes thoracic spine. IMPRESSION: BILATERAL nonobstructing renal calculi. Mild prostatic enlargement. Minimal distal colonic diverticulosis without evidence of diverticulitis. Bibasilar atelectasis. LEFT inguinal hernia containing fat. No acute abnormalities. Electronically Signed   By: Lavonia Dana M.D.   On: 06/25/2018 09:00   Dg Chest 2 View  Result Date: 06/25/2018 CLINICAL DATA:  Abdominal pain EXAM: CHEST - 2 VIEW COMPARISON:  06/14/2018 FINDINGS: Mild cardiomegaly and vascular congestion. Scarring in the left lung base is stable. No other confluent opacities. No effusions or acute bony abnormality. IMPRESSION: Cardiomegaly, vascular congestion. Left basilar scarring. Electronically Signed   By: Rolm Baptise M.D.   On: 06/25/2018 09:03    EKG: Independently reviewed. Sinus rhythm. Possible old  anterior infarct. No old EKG to compare.   Assessment/Plan ESRD on dialysis M/W/F -Patient came to the ER c/o abdominal pain for the past 3 weeks and constipation for the past 4 days. He missed his last 2 dialysis appointments.  -Has a K at 7.1, Hgb at 9.5 on admission -Acute hemodialysis was administered -Check phosphorus -repeat BMET and CBC in the am -nephrology consulted and will follow recommendations. -Anticipate another HD treatment in am.  Diabetes type 2: with nephropathy  -CBGs between 132-195 -will Check A1c -Start SSI and Levemir -Modified carbohydrate diet ordered.  Hyperkalemia -Patient had a K at 7.1 on admission -Calcium gluconate, Kayexalate, and acute hemodialysis were administered -Monitor on telemetry -follow potassium trend.   Hypertension -Bp at 170/84 -Resume Amlodipine  Class 1 obesity -low calorie diet,  portion control and increase physical activity recommended.  Chronic diastolic HF -continue volume management with HD -Low sodium diet encouraged -follow daily weight and strict intake/output.  GERD -continue PPI  Chronic back pain -continue PRN analgesics  Secondary hyperparathyroidism  -resume Phoslo -renal service on board; will follow further recommendations -checking phosphorus and calcium level.    DVT prophylaxis: Heparin Code Status: Full Code Family Communication: None at bedside Disposition Plan: Dialysis with possible discharge on 11/14, pending electrolyte stability after acute dialysis treatment Consults called: Nephrology Admission status: Observation, telemetry bed, LOS less than 2 mid nights.    Time Spent: 70 minutes  Barton Dubois MD [facility] Pager [phone]  If 7PM-7AM, please contact night-coverage [email or web address] Password [email or web address]  06/25/2018, 4:16 PM

## 2018-06-25 NOTE — ED Notes (Signed)
Lynnae Sandhoff, RT to administer Neb treatment
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
## 2018-06-25 NOTE — ED Notes (Signed)
Date and time results received: 06/25/18 0846 (use smartphrase ".now" to insert current time)  Test: potassium  Critical Value: 7.1  Name of Provider Notified: Thurnell Garbe MD   Orders Received? Or Actions Taken?: none

## 2018-06-25 NOTE — Procedures (Signed)
HEMODIALYSIS TREATMENT NOTE:  4.5 hour low-heparin dialysis completed via right upper arm AVF (16g/antegrade). Goal met: 2.2 liters removed without interruption in ultrafiltration.  Treatment was paused twice for pt to use restroom as he was given kayexelate in ED prior to dialysis.  Treatment was paused a third time due change out a clotting venous line.  All blood was returned and hemostasis was achieved in 20 minutes.    Rockwell Alexandria, RN.

## 2018-06-25 NOTE — Consult Note (Signed)
Alejandro Lee MRN: [id number] DOB/AGE: 02/19/1961 57 y.o. Primary Care Physician:Hagler, Apolonio Schneiders, MD Admit date: 06/25/2018 Chief Complaint:  Chief Complaint  Patient presents with  . Abdominal Pain   HPI: Pt is a 57 year old male with past medical hx of ESRD who came to ER with c/o Abdominal pain  HPI dates back to past 203 days when pt started having abdominal pain. HPI dates back to 1-2 days when pt started having abdominal pain, generalized. Pt says my pain is now better. Pt main concern was " I need my dialysis" NO c/o fever/cough/chills No c/o frequency/urgency/dysuria No c/o change in speech and vision No c/o sycope Pt has missed his past few dialysis tx.   Past Medical History:  Diagnosis Date  . Arthritis   . Back pain   . CHF (congestive heart failure) ([place])   . Chronic kidney disease    dialysis M/W/F  . Diabetes mellitus without complication ([place])   . GERD (gastroesophageal reflux disease)   . Headache    migraines  . Hyperlipidemia   . Hypertension   . Myocardial infarction [facility])    patient states it was seen on EKG, denies a cardiac cath        Family History  Problem Relation Age of Onset  . Diabetes Mother   . Hypertension Mother   . Diabetes Father   . Heart disease Father   . Cancer Father   . Hypertension Father   . Diabetes Sister   . Hypertension Sister   . Diabetes Brother   . Hypertension Brother   . Cancer Brother     Social History:  reports that he has quit smoking. His smoking use included cigarettes. He has never used smokeless tobacco. He reports that he drank alcohol. He reports that he does not use drugs.   Allergies: No Known Allergies   (Not in a hospital admission)     WER:XVQMG from the symptoms mentioned above,there are no other symptoms referable to all systems reviewed.     Physical Exam: Vital signs in last 24 hours: Temp:  [98.2 F (36.8 C)] 98.2 F (36.8 C) (11/13 0734) Pulse Rate:  [73-87] 73 (11/13  0830) Resp:  [14-20] 14 (11/13 0830) BP: (188-213)/(77-87) 188/77 (11/13 0830) SpO2:  [85 %-94 %] 85 % (11/13 0830) Weight:  [100 kg] 100 kg (11/13 0732) Weight change:     Intake/Output from previous day: No intake/output data recorded. No intake/output data recorded.   Physical Exam: General- pt is awake,alert, oriented to time place and person Resp- No acute REsp distress, CTA B/L NO Rhonchi CVS- S1S2 regular in rate and rhythm GIT- BS+, soft, NT, ND EXT- NO LE Edema, Cyanosis CNS- CN 2-12 grossly intact. Moving all 4 extremities Psych- normal mood and affect Access- AVF+   Lab Results: CBC Recent Labs    06/25/18 0800 06/25/18 0807  WBC 8.6  --   HGB 8.8* 9.5*  HCT 28.9* 28.0*  PLT 158  --     BMET Recent Labs    06/25/18 0800 06/25/18 0807  NA 135 133*  K 7.1* 7.0*  CL 94* 97*  CO2 22  --   GLUCOSE 169* 161*  BUN 158* >140*  CREATININE 19.49* >18.00*  CALCIUM 9.3  --     MICRO No results found for this or any previous visit (from the past 240 hour(s)).    Lab Results  Component Value Date   CALCIUM 9.3 06/25/2018   CAION 1.11 (L) 06/25/2018  Impression: 1)Renal  ESRD on HD                Pt is on Monday/Wednesday/friday schedule                Non adherence to tx                 Pt last HD was on last wednesday  2)HTN    BP not at goal                Will suggest to start outpt meds                Hd should help    3)Anemia IN ESRD the goal for hgb on ESA is  9--11. HGb at goal will keep on epo   4)CKD Mineral-Bone Disorder Calcium is at goal. Phosphorus will check  5)GI came to ER with c/op abdominal pain Primary MD following  6)Electrolytes  hyperkalemic   Sec to ESRD + missed HD  hyponatremic  sec to ESRD-inability to get rid of free water  7)Acid base Co2 at goal     Plan:  Will dialyze today Will use 2k bath Will keep on epo Will try to take 2.5 liters off. I did educate pt about possible need of  Hd again in am.Pt voiced understanding      [place] 06/25/2018, 10:40 AM

## 2018-06-25 NOTE — ED Notes (Signed)
Date and time results received: 06/25/18 0846 (use smartphrase ".now" to insert current time)  Test: troponin  Critical Value: 0.07  Name of Provider Notified: Thurnell Garbe MD   Orders Received? Or Actions Taken?: n/a

## 2018-06-26 DIAGNOSIS — I10 Essential (primary) hypertension: Secondary | ICD-10-CM

## 2018-06-26 DIAGNOSIS — Z6833 Body mass index (BMI) 33.0-33.9, adult: Secondary | ICD-10-CM | POA: Diagnosis not present

## 2018-06-26 DIAGNOSIS — N186 End stage renal disease: Secondary | ICD-10-CM | POA: Diagnosis not present

## 2018-06-26 DIAGNOSIS — E1169 Type 2 diabetes mellitus with other specified complication: Secondary | ICD-10-CM | POA: Diagnosis not present

## 2018-06-26 DIAGNOSIS — G8929 Other chronic pain: Secondary | ICD-10-CM | POA: Diagnosis not present

## 2018-06-26 DIAGNOSIS — D631 Anemia in chronic kidney disease: Secondary | ICD-10-CM

## 2018-06-26 DIAGNOSIS — N185 Chronic kidney disease, stage 5: Secondary | ICD-10-CM

## 2018-06-26 DIAGNOSIS — K219 Gastro-esophageal reflux disease without esophagitis: Secondary | ICD-10-CM | POA: Diagnosis not present

## 2018-06-26 DIAGNOSIS — I5032 Chronic diastolic (congestive) heart failure: Secondary | ICD-10-CM

## 2018-06-26 DIAGNOSIS — E875 Hyperkalemia: Secondary | ICD-10-CM | POA: Diagnosis not present

## 2018-06-26 DIAGNOSIS — E6609 Other obesity due to excess calories: Secondary | ICD-10-CM | POA: Diagnosis not present

## 2018-06-26 DIAGNOSIS — Z992 Dependence on renal dialysis: Secondary | ICD-10-CM | POA: Diagnosis not present

## 2018-06-26 DIAGNOSIS — M549 Dorsalgia, unspecified: Secondary | ICD-10-CM | POA: Diagnosis not present

## 2018-06-26 DIAGNOSIS — Z9115 Patient's noncompliance with renal dialysis: Secondary | ICD-10-CM | POA: Diagnosis not present

## 2018-06-26 LAB — RENAL FUNCTION PANEL
ANION GAP: 14 (ref 5–15)
Albumin: 3.3 g/dL — ABNORMAL LOW (ref 3.5–5.0)
BUN: 80 mg/dL — ABNORMAL HIGH (ref 6–20)
CO2: 28 mmol/L (ref 22–32)
Calcium: 8 mg/dL — ABNORMAL LOW (ref 8.9–10.3)
Chloride: 93 mmol/L — ABNORMAL LOW (ref 98–111)
Creatinine, Ser: 12.48 mg/dL — ABNORMAL HIGH (ref 0.61–1.24)
GFR, EST AFRICAN AMERICAN: 5 mL/min — AB (ref >60)
GFR, EST NON AFRICAN AMERICAN: 4 mL/min — AB (ref >60)
Glucose, Bld: 217 mg/dL — ABNORMAL HIGH (ref 70–99)
PHOSPHORUS: 5.7 mg/dL — AB (ref 2.5–4.6)
Potassium: 5 mmol/L (ref 3.5–5.1)
Sodium: 135 mmol/L (ref 135–145)

## 2018-06-26 LAB — CBC
HCT: 26.6 % — ABNORMAL LOW (ref 39.0–52.0)
HEMOGLOBIN: 8.1 g/dL — AB (ref 13.0–17.0)
MCH: 24.5 pg — AB (ref 26.0–34.0)
MCHC: 30.5 g/dL (ref 30.0–36.0)
MCV: 80.4 fL (ref 80.0–100.0)
Platelets: 167 10*3/uL (ref 150–400)
RBC: 3.31 MIL/uL — ABNORMAL LOW (ref 4.22–5.81)
RDW: 15.6 % — ABNORMAL HIGH (ref 11.5–15.5)
WBC: 6.2 10*3/uL (ref 4.0–10.5)
nRBC: 0 % (ref 0.0–0.2)

## 2018-06-26 LAB — GLUCOSE, CAPILLARY
GLUCOSE-CAPILLARY: 177 mg/dL — AB (ref 70–99)
GLUCOSE-CAPILLARY: 75 mg/dL (ref 70–99)

## 2018-06-26 LAB — PHOSPHORUS: Phosphorus: 5.7 mg/dL — ABNORMAL HIGH (ref 2.5–4.6)

## 2018-06-26 MED ORDER — PANTOPRAZOLE SODIUM 40 MG PO TBEC: 40.0000 mg | DELAYED_RELEASE_TABLET | Freq: Every day | ORAL | 1 refills | Status: DC

## 2018-06-26 MED ORDER — AMLODIPINE BESYLATE 5 MG PO TABS: 5.0000 mg | ORAL_TABLET | Freq: Every day | ORAL | 2 refills | Status: DC

## 2018-06-26 MED ORDER — SORBITOL 70 % PO SOLN: 15.0000 mL | Freq: Every day | ORAL | 0 refills | Status: DC | PRN

## 2018-06-26 NOTE — Progress Notes (Signed)
Alejandro Lee  MRN: [id number]  DOB/AGE: 55/55/1962 55 y.o.  Primary Care Physician:Hagler, Apolonio Schneiders, MD  Admit date: 06/25/2018  Chief Complaint:  Chief Complaint  Patient presents with  . Abdominal Pain    S-Pt presented on  06/25/2018 with  Chief Complaint  Patient presents with  . Abdominal Pain  .    Pt today feels better  Meds  . amLODipine  5 mg Oral Daily  . calcium acetate  2,001 mg Oral TID WC  . Chlorhexidine Gluconate Cloth  6 each Topical Q0600  . epoetin (EPOGEN/PROCRIT) injection  3,000 Units Subcutaneous Q M,W,F-HD  . heparin injection (subcutaneous)  5,000 Units Subcutaneous Q8H  . insulin aspart  0-15 Units Subcutaneous TID WC  . insulin detemir  10 Units Subcutaneous QHS  . pantoprazole  40 mg Oral Daily  . sodium chloride flush  3 mL Intravenous Q12H       Physical Exam: Vital signs in last 24 hours: Temp:  [98 F (36.7 C)-98.6 F (37 C)] 98.6 F (37 C) (11/14 0440) Pulse Rate:  [80-87] 87 (11/14 0440) Resp:  [16-18] 18 (11/14 0440) BP: (147-178)/(61-89) 165/72 (11/14 0440) SpO2:  [87 %-100 %] 95 % (11/14 0818) Weight:  [96 kg] 96 kg (11/13 1900) Weight change:  Last BM Date: 06/25/18  Intake/Output from previous day: 11/13 0701 - 11/14 0700 In: 516.9 [P.O.:480; I.V.:36.9] Out: 2200  No intake/output data recorded.   Physical Exam: General- pt is awake,alert, oriented to time place and person Resp- No acute REsp distress, CTA B/L NO Rhonchi CVS- S1S2 regular ij rate and rhythm GIT- BS+, soft, NT, ND EXT- NO LE Edema, Cyanosis Access- AVF +  Lab Results: CBC Recent Labs    06/25/18 0800 06/25/18 0807 06/26/18 0509  WBC 8.6  --  6.2  HGB 8.8* 9.5* 8.1*  HCT 28.9* 28.0* 26.6*  PLT 158  --  167    BMET Recent Labs    06/25/18 0800 06/25/18 0807 06/26/18 0509  NA 135 133* 135  K 7.1* 7.0* 5.0  CL 94* 97* 93*  CO2 22  --  28  GLUCOSE 169* 161* 217*  BUN 158* >140* 80*  CREATININE 19.49* >18.00* 12.48*  CALCIUM 9.3   --  8.0*      Lab Results  Component Value Date   CALCIUM 8.0 (L) 06/26/2018   CAION 1.11 (L) 06/25/2018   PHOS 5.7 (H) 06/26/2018   PHOS 5.7 (H) 06/26/2018               Impression: 1)Renal  ESRD on HD                Pt is on Monday/Wednesday/friday schedule                Non adherence to tx                Pt was last dialyzed yesterday  2)HTN    BP  at goal                3)Anemia IN ESRD the goal for hgb on ESA is  9--11. HGb at goal will keep on epo   4)CKD Mineral-Bone Disorder Calcium is at goal. Phosphorus not at goal   5)GI came to ER with c/op abdominal pain Primary MD following  6)Electrolytes  hyperkalemic   Sec to ESRD + missed HD   Now better  hyponatremic  sec to ESRD-inability to get rid of free water  now better  7)Acid base Co2 at goal      Plan:  No need for Hd today Will continue current care.     Duston Smolenski S 06/26/2018, 4:11 PM
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
## 2018-06-26 NOTE — Care Management Obs Status (Signed)
[facility] NOTIFICATION   Patient Details  Name: DAREN YEAGLE MRN: [id number] Date of Birth: [birth year]   Medicare Observation Status Notification Given:  Yes    Sherald Barge, RN 06/26/2018, 12:28 PM

## 2018-06-26 NOTE — Plan of Care (Signed)
  Problem: Education: Goal: Knowledge of General Education information will improve Description Including pain rating scale, medication(s)/side effects and non-pharmacologic comfort measures Outcome: Progressing   Problem: Clinical Measurements: Goal: Diagnostic test results will improve Outcome: Progressing   Problem: Nutrition: Goal: Adequate nutrition will be maintained Outcome: Progressing   Problem: Coping: Goal: Level of anxiety will decrease Outcome: Progressing   Problem: Safety: Goal: Ability to remain free from injury will improve Outcome: Progressing   Problem: Skin Integrity: Goal: Risk for impaired skin integrity will decrease Outcome: Progressing

## 2018-06-26 NOTE — Progress Notes (Signed)
Patient discharged home today per MD orders. Patient vital signs WDL. IV removed and site WDL. Discharge Instructions including follow up appointments, medications, and education reviewed with patient. Patient verbalizes understanding. Patient is transported out via wheelchair.  

## 2018-06-27 DIAGNOSIS — D631 Anemia in chronic kidney disease: Secondary | ICD-10-CM | POA: Diagnosis not present

## 2018-06-27 DIAGNOSIS — N186 End stage renal disease: Secondary | ICD-10-CM | POA: Diagnosis not present

## 2018-06-27 DIAGNOSIS — Z992 Dependence on renal dialysis: Secondary | ICD-10-CM | POA: Diagnosis not present

## 2018-06-27 DIAGNOSIS — D509 Iron deficiency anemia, unspecified: Secondary | ICD-10-CM | POA: Diagnosis not present

## 2018-06-27 DIAGNOSIS — N2581 Secondary hyperparathyroidism of renal origin: Secondary | ICD-10-CM | POA: Diagnosis not present

## 2018-06-30 DIAGNOSIS — D509 Iron deficiency anemia, unspecified: Secondary | ICD-10-CM | POA: Diagnosis not present

## 2018-06-30 DIAGNOSIS — N186 End stage renal disease: Secondary | ICD-10-CM | POA: Diagnosis not present

## 2018-06-30 DIAGNOSIS — D631 Anemia in chronic kidney disease: Secondary | ICD-10-CM | POA: Diagnosis not present

## 2018-06-30 DIAGNOSIS — N2581 Secondary hyperparathyroidism of renal origin: Secondary | ICD-10-CM | POA: Diagnosis not present

## 2018-06-30 DIAGNOSIS — Z992 Dependence on renal dialysis: Secondary | ICD-10-CM | POA: Diagnosis not present

## 2018-07-01 ENCOUNTER — Encounter: Payer: Self-pay | Admitting: *Deleted

## 2018-07-01 ENCOUNTER — Other Ambulatory Visit: Payer: Self-pay | Admitting: *Deleted

## 2018-07-01 ENCOUNTER — Ambulatory Visit (HOSPITAL_COMMUNITY)
Admission: RE | Admit: 2018-07-01 | Discharge: 2018-07-01 | Disposition: A | Discharge status: Home / Self Care | Payer: Medicare Other | Source: Ambulatory Visit | Attending: Vascular Surgery | Admitting: Vascular Surgery

## 2018-07-01 ENCOUNTER — Ambulatory Visit (INDEPENDENT_AMBULATORY_CARE_PROVIDER_SITE_OTHER): Payer: Medicare Other | Admitting: Vascular Surgery

## 2018-07-01 ENCOUNTER — Encounter: Payer: Self-pay | Admitting: Vascular Surgery

## 2018-07-01 VITALS — BP 177/82 | HR 77 | Resp 18 | Ht 67.0 in | Wt 211.0 lb

## 2018-07-01 DIAGNOSIS — Z992 Dependence on renal dialysis: Secondary | ICD-10-CM | POA: Diagnosis not present

## 2018-07-01 DIAGNOSIS — N186 End stage renal disease: Secondary | ICD-10-CM | POA: Diagnosis not present

## 2018-07-01 DIAGNOSIS — T82510A Breakdown (mechanical) of surgically created arteriovenous fistula, initial encounter: Secondary | ICD-10-CM

## 2018-07-01 DIAGNOSIS — T82898A Other specified complication of vascular prosthetic devices, implants and grafts, initial encounter: Secondary | ICD-10-CM | POA: Insufficient documentation

## 2018-07-01 NOTE — H&P (View-Only) (Signed)
[facility] [place]  Patient name: Alejandro Lee MRN: [id number] DOB: 05-Nov-1960 Sex: male  REASON FOR VISIT: Evaluation right arm AV fistula.  HPI: Alejandro Lee is a 57 y.o. male her today for evaluation of right arm AV fistula.  He had undergone plication of portion of his right upper arm fistula by Dr. Geryl Councilman on 01/07/2018.  Apparently the plan is been to consider plication of the lower portion after the upper portion and it healed.  He reports that he is having good dialysis and is not alarming the machine and is having no difficulty sticking the access.  He denies any prolonged bleeding or other difficulties with the fistula.  Past Medical History:  Diagnosis Date  . Arthritis   . Back pain   . CHF (congestive heart failure) ([place])   . Chronic kidney disease    dialysis M/W/F  . Diabetes mellitus without complication ([place])   . GERD (gastroesophageal reflux disease)   . Headache    migraines  . Hyperlipidemia   . Hypertension   . Myocardial infarction [facility]    patient states it was seen on EKG, denies a cardiac cath    Family History  Problem Relation Age of Onset  . Diabetes Mother   . Hypertension Mother   . Diabetes Father   . Heart disease Father   . Cancer Father   . Hypertension Father   . Diabetes Sister   . Hypertension Sister   . Diabetes Brother   . Hypertension Brother   . Cancer Brother     SOCIAL HISTORY: Social History   Tobacco Use  . Smoking status: Former Smoker    Types: Cigarettes  . Smokeless tobacco: Never Used  Substance Use Topics  . Alcohol use: Not Currently    No Known Allergies  Current Outpatient Medications  Medication Sig Dispense Refill  . acetaminophen (TYLENOL) 325 MG tablet Take 650 mg by mouth daily as needed for headache.    Marland Kitchen amLODipine (NORVASC) 5 MG tablet Take 1 tablet (5 mg total) by mouth daily. 30 tablet 2  . calcium acetate (PHOSLO) 667 MG capsule Take 2,001  mg by mouth 3 (three) times daily with meals.     . diazepam (VALIUM) 5 MG tablet Take 1 tablet by mouth 3 (three) times daily as needed.  0  . lidocaine-prilocaine (EMLA) cream Apply 1 application topically as needed (port acces).    . methocarbamol (ROBAXIN) 500 MG tablet Take 1 tablet (500 mg total) by mouth every 8 (eight) hours as needed for muscle spasms. 30 tablet 0  . pantoprazole (PROTONIX) 40 MG tablet Take 1 tablet (40 mg total) by mouth daily. 30 tablet 1  . sorbitol 70 % solution Take 15 mLs by mouth daily as needed (severe constipation.). 473 mL 0   No current facility-administered medications for this visit.     REVIEW OF SYSTEMS:  [X]  denotes positive finding, [ ]  denotes negative finding Cardiac  Comments:  Chest pain or chest pressure:    Shortness of breath upon exertion: x   Short of breath when lying flat:    Irregular heart rhythm:        Vascular    Pain in calf, thigh, or hip brought on by ambulation:    Pain in feet at night that wakes you up from your sleep:     Blood clot in your veins:    Leg swelling:  PHYSICAL EXAM: Vitals:   07/01/18 1206  BP: (!) 177/82  Pulse: 77  Resp: 18  SpO2: 93%  Weight: 211 lb (95.7 kg)  Height: 5\' 7"  (1.702 m)    GENERAL: The patient is a well-nourished male, in no acute distress. The vital signs are documented above. 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  Does have some swelling in his right arm versus his left arm.  Has excellent thrill in the fistula. PULMONARY: There is good air exchange  MUSCULOSKELETAL: There are no major deformities or cyanosis. NEUROLOGIC: No focal weakness or paresthesias are detected. SKIN: There are no ulcers or rashes noted. PSYCHIATRIC: The patient has a normal affect.  DATA:  Plex shows some aneurysmal changes expected but good flow through the fistula.  MEDICAL ISSUES: Long discussion with patient and  his wife present.  I do not see any evidence of skin breakdown that would be concerning for bleeding.  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  Explained this would also potentially put his access at risk for thrombosis and potential continued aneurysmal growth.  Have recommended a fistulogram as an outpatient and potential angioplasty if indicated.  We will schedule this at his convenience on a nondialysis day    Rosetta Posner, MD [facility] Tel [phone] Pager [phone]

## 2018-07-01 NOTE — Progress Notes (Signed)
[facility] [place]  Patient name: Alejandro Lee MRN: [id number] DOB: 1961-01-15 Sex: male  REASON FOR VISIT: Evaluation right arm AV fistula.  HPI: Alejandro Lee is a 57 y.o. male her today for evaluation of right arm AV fistula.  He had undergone plication of portion of his right upper arm fistula by Dr. Geryl Councilman on 01/07/2018.  Apparently the plan is been to consider plication of the lower portion after the upper portion and it healed.  He reports that he is having good dialysis and is not alarming the machine and is having no difficulty sticking the access.  He denies any prolonged bleeding or other difficulties with the fistula.  Past Medical History:  Diagnosis Date  . Arthritis   . Back pain   . CHF (congestive heart failure) ([place])   . Chronic kidney disease    dialysis M/W/F  . Diabetes mellitus without complication ([place])   . GERD (gastroesophageal reflux disease)   . Headache    migraines  . Hyperlipidemia   . Hypertension   . Myocardial infarction [facility])    patient states it was seen on EKG, denies a cardiac cath    Family History  Problem Relation Age of Onset  . Diabetes Mother   . Hypertension Mother   . Diabetes Father   . Heart disease Father   . Cancer Father   . Hypertension Father   . Diabetes Sister   . Hypertension Sister   . Diabetes Brother   . Hypertension Brother   . Cancer Brother     SOCIAL HISTORY: Social History   Tobacco Use  . Smoking status: Former Smoker    Types: Cigarettes  . Smokeless tobacco: Never Used  Substance Use Topics  . Alcohol use: Not Currently    No Known Allergies  Current Outpatient Medications  Medication Sig Dispense Refill  . acetaminophen (TYLENOL) 325 MG tablet Take 650 mg by mouth daily as needed for headache.    Marland Kitchen amLODipine (NORVASC) 5 MG tablet Take 1 tablet (5 mg total) by mouth daily. 30 tablet 2  . calcium acetate (PHOSLO) 667 MG capsule Take 2,001  mg by mouth 3 (three) times daily with meals.     . diazepam (VALIUM) 5 MG tablet Take 1 tablet by mouth 3 (three) times daily as needed.  0  . lidocaine-prilocaine (EMLA) cream Apply 1 application topically as needed (port acces).    . methocarbamol (ROBAXIN) 500 MG tablet Take 1 tablet (500 mg total) by mouth every 8 (eight) hours as needed for muscle spasms. 30 tablet 0  . pantoprazole (PROTONIX) 40 MG tablet Take 1 tablet (40 mg total) by mouth daily. 30 tablet 1  . sorbitol 70 % solution Take 15 mLs by mouth daily as needed (severe constipation.). 473 mL 0   No current facility-administered medications for this visit.     REVIEW OF SYSTEMS:  [X]  denotes positive finding, [ ]  denotes negative finding Cardiac  Comments:  Chest pain or chest pressure:    Shortness of breath upon exertion: x   Short of breath when lying flat:    Irregular heart rhythm:        Vascular    Pain in calf, thigh, or hip brought on by ambulation:    Pain in feet at night that wakes you up from your sleep:     Blood clot in your veins:    Leg swelling:  PHYSICAL EXAM: Vitals:   07/01/18 1206  BP: (!) 177/82  Pulse: 77  Resp: 18  SpO2: 93%  Weight: 211 lb (95.7 kg)  Height: 5\' 7"  (1.702 m)    GENERAL: The patient is a well-nourished male, in no acute distress. The vital signs are documented above. 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  Does have some swelling in his right arm versus his left arm.  Has excellent thrill in the fistula. PULMONARY: There is good air exchange  MUSCULOSKELETAL: There are no major deformities or cyanosis. NEUROLOGIC: No focal weakness or paresthesias are detected. SKIN: There are no ulcers or rashes noted. PSYCHIATRIC: The patient has a normal affect.  DATA:  Plex shows some aneurysmal changes expected but good flow through the fistula.  MEDICAL ISSUES: Long discussion with patient and  his wife present.  I do not see any evidence of skin breakdown that would be concerning for bleeding.  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  Explained this would also potentially put his access at risk for thrombosis and potential continued aneurysmal growth.  Have recommended a fistulogram as an outpatient and potential angioplasty if indicated.  We will schedule this at his convenience on a nondialysis day    Rosetta Posner, MD [facility] Tel [phone] Pager [phone]

## 2018-07-02 DIAGNOSIS — N186 End stage renal disease: Secondary | ICD-10-CM | POA: Diagnosis not present

## 2018-07-02 DIAGNOSIS — Z992 Dependence on renal dialysis: Secondary | ICD-10-CM | POA: Diagnosis not present

## 2018-07-02 DIAGNOSIS — D631 Anemia in chronic kidney disease: Secondary | ICD-10-CM | POA: Diagnosis not present

## 2018-07-02 DIAGNOSIS — D509 Iron deficiency anemia, unspecified: Secondary | ICD-10-CM | POA: Diagnosis not present

## 2018-07-02 DIAGNOSIS — N2581 Secondary hyperparathyroidism of renal origin: Secondary | ICD-10-CM | POA: Diagnosis not present

## 2018-07-04 DIAGNOSIS — N2581 Secondary hyperparathyroidism of renal origin: Secondary | ICD-10-CM | POA: Diagnosis not present

## 2018-07-04 DIAGNOSIS — D631 Anemia in chronic kidney disease: Secondary | ICD-10-CM | POA: Diagnosis not present

## 2018-07-04 DIAGNOSIS — N186 End stage renal disease: Secondary | ICD-10-CM | POA: Diagnosis not present

## 2018-07-04 DIAGNOSIS — Z992 Dependence on renal dialysis: Secondary | ICD-10-CM | POA: Diagnosis not present

## 2018-07-04 DIAGNOSIS — D509 Iron deficiency anemia, unspecified: Secondary | ICD-10-CM | POA: Diagnosis not present

## 2018-07-07 DIAGNOSIS — D631 Anemia in chronic kidney disease: Secondary | ICD-10-CM | POA: Diagnosis not present

## 2018-07-07 DIAGNOSIS — D509 Iron deficiency anemia, unspecified: Secondary | ICD-10-CM | POA: Diagnosis not present

## 2018-07-07 DIAGNOSIS — Z992 Dependence on renal dialysis: Secondary | ICD-10-CM | POA: Diagnosis not present

## 2018-07-07 DIAGNOSIS — N186 End stage renal disease: Secondary | ICD-10-CM | POA: Diagnosis not present

## 2018-07-07 DIAGNOSIS — N2581 Secondary hyperparathyroidism of renal origin: Secondary | ICD-10-CM | POA: Diagnosis not present

## 2018-07-09 DIAGNOSIS — D509 Iron deficiency anemia, unspecified: Secondary | ICD-10-CM | POA: Diagnosis not present

## 2018-07-09 DIAGNOSIS — D631 Anemia in chronic kidney disease: Secondary | ICD-10-CM | POA: Diagnosis not present

## 2018-07-09 DIAGNOSIS — N186 End stage renal disease: Secondary | ICD-10-CM | POA: Diagnosis not present

## 2018-07-09 DIAGNOSIS — N2581 Secondary hyperparathyroidism of renal origin: Secondary | ICD-10-CM | POA: Diagnosis not present

## 2018-07-09 DIAGNOSIS — Z992 Dependence on renal dialysis: Secondary | ICD-10-CM | POA: Diagnosis not present

## 2018-07-11 DIAGNOSIS — N186 End stage renal disease: Secondary | ICD-10-CM | POA: Diagnosis not present

## 2018-07-11 DIAGNOSIS — Z992 Dependence on renal dialysis: Secondary | ICD-10-CM | POA: Diagnosis not present

## 2018-07-11 DIAGNOSIS — D631 Anemia in chronic kidney disease: Secondary | ICD-10-CM | POA: Diagnosis not present

## 2018-07-11 DIAGNOSIS — D509 Iron deficiency anemia, unspecified: Secondary | ICD-10-CM | POA: Diagnosis not present

## 2018-07-11 DIAGNOSIS — N2581 Secondary hyperparathyroidism of renal origin: Secondary | ICD-10-CM | POA: Diagnosis not present

## 2018-07-12 DIAGNOSIS — Z992 Dependence on renal dialysis: Secondary | ICD-10-CM | POA: Diagnosis not present

## 2018-07-12 DIAGNOSIS — N186 End stage renal disease: Secondary | ICD-10-CM | POA: Diagnosis not present

## 2018-07-14 ENCOUNTER — Telehealth: Payer: Self-pay | Admitting: *Deleted

## 2018-07-14 ENCOUNTER — Encounter: Payer: Self-pay | Admitting: *Deleted

## 2018-07-14 DIAGNOSIS — Z992 Dependence on renal dialysis: Secondary | ICD-10-CM | POA: Diagnosis not present

## 2018-07-14 DIAGNOSIS — N186 End stage renal disease: Secondary | ICD-10-CM | POA: Diagnosis not present

## 2018-07-14 DIAGNOSIS — D509 Iron deficiency anemia, unspecified: Secondary | ICD-10-CM | POA: Diagnosis not present

## 2018-07-14 DIAGNOSIS — D631 Anemia in chronic kidney disease: Secondary | ICD-10-CM | POA: Diagnosis not present

## 2018-07-14 DIAGNOSIS — N2581 Secondary hyperparathyroidism of renal origin: Secondary | ICD-10-CM | POA: Diagnosis not present

## 2018-07-14 NOTE — Telephone Encounter (Signed)
Fistulogram rescheduled for 07/22/18 10:30 am at patient's request. Letter faxed to [facility] for transportation. Instructed to follow instructions per pre-procedure letter, NPO past MN night prior and to be at [facility] at 8:30 am

## 2018-07-16 DIAGNOSIS — N186 End stage renal disease: Secondary | ICD-10-CM | POA: Diagnosis not present

## 2018-07-16 DIAGNOSIS — N2581 Secondary hyperparathyroidism of renal origin: Secondary | ICD-10-CM | POA: Diagnosis not present

## 2018-07-16 DIAGNOSIS — D509 Iron deficiency anemia, unspecified: Secondary | ICD-10-CM | POA: Diagnosis not present

## 2018-07-16 DIAGNOSIS — D631 Anemia in chronic kidney disease: Secondary | ICD-10-CM | POA: Diagnosis not present

## 2018-07-16 DIAGNOSIS — Z992 Dependence on renal dialysis: Secondary | ICD-10-CM | POA: Diagnosis not present

## 2018-07-18 DIAGNOSIS — N2581 Secondary hyperparathyroidism of renal origin: Secondary | ICD-10-CM | POA: Diagnosis not present

## 2018-07-18 DIAGNOSIS — D631 Anemia in chronic kidney disease: Secondary | ICD-10-CM | POA: Diagnosis not present

## 2018-07-18 DIAGNOSIS — N186 End stage renal disease: Secondary | ICD-10-CM | POA: Diagnosis not present

## 2018-07-18 DIAGNOSIS — D509 Iron deficiency anemia, unspecified: Secondary | ICD-10-CM | POA: Diagnosis not present

## 2018-07-18 DIAGNOSIS — Z992 Dependence on renal dialysis: Secondary | ICD-10-CM | POA: Diagnosis not present

## 2018-07-21 DIAGNOSIS — N2581 Secondary hyperparathyroidism of renal origin: Secondary | ICD-10-CM | POA: Diagnosis not present

## 2018-07-21 DIAGNOSIS — D509 Iron deficiency anemia, unspecified: Secondary | ICD-10-CM | POA: Diagnosis not present

## 2018-07-21 DIAGNOSIS — D631 Anemia in chronic kidney disease: Secondary | ICD-10-CM | POA: Diagnosis not present

## 2018-07-21 DIAGNOSIS — N186 End stage renal disease: Secondary | ICD-10-CM | POA: Diagnosis not present

## 2018-07-21 DIAGNOSIS — Z992 Dependence on renal dialysis: Secondary | ICD-10-CM | POA: Diagnosis not present

## 2018-07-22 ENCOUNTER — Ambulatory Visit (HOSPITAL_COMMUNITY): Payer: Medicare Other | Admitting: Anesthesiology

## 2018-07-22 ENCOUNTER — Ambulatory Visit (HOSPITAL_COMMUNITY)
Admission: RE | Admit: 2018-07-22 | Discharge: 2018-07-22 | Disposition: A | Discharge status: Home / Self Care | Payer: Medicare Other | Source: Ambulatory Visit | Attending: Surgery | Admitting: Surgery

## 2018-07-22 ENCOUNTER — Other Ambulatory Visit: Payer: Self-pay

## 2018-07-22 ENCOUNTER — Encounter (HOSPITAL_COMMUNITY): Payer: Self-pay | Admitting: Surgery

## 2018-07-22 ENCOUNTER — Encounter (HOSPITAL_COMMUNITY)
Admission: RE | Disposition: A | Discharge status: Home / Self Care | Payer: Self-pay | Source: Ambulatory Visit | Attending: Surgery

## 2018-07-22 DIAGNOSIS — I509 Heart failure, unspecified: Secondary | ICD-10-CM | POA: Diagnosis not present

## 2018-07-22 DIAGNOSIS — E785 Hyperlipidemia, unspecified: Secondary | ICD-10-CM | POA: Insufficient documentation

## 2018-07-22 DIAGNOSIS — I132 Hypertensive heart and chronic kidney disease with heart failure and with stage 5 chronic kidney disease, or end stage renal disease: Secondary | ICD-10-CM | POA: Diagnosis not present

## 2018-07-22 DIAGNOSIS — T82858A Stenosis of vascular prosthetic devices, implants and grafts, initial encounter: Secondary | ICD-10-CM | POA: Insufficient documentation

## 2018-07-22 DIAGNOSIS — Z87891 Personal history of nicotine dependence: Secondary | ICD-10-CM | POA: Insufficient documentation

## 2018-07-22 DIAGNOSIS — Y832 Surgical operation with anastomosis, bypass or graft as the cause of abnormal reaction of the patient, or of later complication, without mention of misadventure at the time of the procedure: Secondary | ICD-10-CM | POA: Insufficient documentation

## 2018-07-22 DIAGNOSIS — M1990 Unspecified osteoarthritis, unspecified site: Secondary | ICD-10-CM | POA: Insufficient documentation

## 2018-07-22 DIAGNOSIS — K219 Gastro-esophageal reflux disease without esophagitis: Secondary | ICD-10-CM | POA: Insufficient documentation

## 2018-07-22 DIAGNOSIS — Z8249 Family history of ischemic heart disease and other diseases of the circulatory system: Secondary | ICD-10-CM | POA: Insufficient documentation

## 2018-07-22 DIAGNOSIS — Z79899 Other long term (current) drug therapy: Secondary | ICD-10-CM | POA: Insufficient documentation

## 2018-07-22 DIAGNOSIS — N186 End stage renal disease: Secondary | ICD-10-CM | POA: Insufficient documentation

## 2018-07-22 DIAGNOSIS — I5032 Chronic diastolic (congestive) heart failure: Secondary | ICD-10-CM | POA: Diagnosis not present

## 2018-07-22 DIAGNOSIS — T82898A Other specified complication of vascular prosthetic devices, implants and grafts, initial encounter: Secondary | ICD-10-CM | POA: Diagnosis not present

## 2018-07-22 DIAGNOSIS — Z992 Dependence on renal dialysis: Secondary | ICD-10-CM | POA: Insufficient documentation

## 2018-07-22 DIAGNOSIS — Z833 Family history of diabetes mellitus: Secondary | ICD-10-CM | POA: Insufficient documentation

## 2018-07-22 DIAGNOSIS — E1122 Type 2 diabetes mellitus with diabetic chronic kidney disease: Secondary | ICD-10-CM | POA: Diagnosis not present

## 2018-07-22 DIAGNOSIS — I252 Old myocardial infarction: Secondary | ICD-10-CM | POA: Diagnosis not present

## 2018-07-22 HISTORY — PX: AV FISTULA PLACEMENT: SHX1204

## 2018-07-22 HISTORY — PX: A/V FISTULAGRAM: CATH118298

## 2018-07-22 HISTORY — PX: PERIPHERAL VASCULAR BALLOON ANGIOPLASTY: CATH118281

## 2018-07-22 LAB — POCT I-STAT, CHEM 8
BUN: 42 mg/dL — AB (ref 6–20)
CREATININE: 11.8 mg/dL — AB (ref 0.61–1.24)
Calcium, Ion: 1.18 mmol/L (ref 1.15–1.40)
Chloride: 104 mmol/L (ref 98–111)
Glucose, Bld: 125 mg/dL — ABNORMAL HIGH (ref 70–99)
HEMATOCRIT: 37 % — AB (ref 39.0–52.0)
Hemoglobin: 12.6 g/dL — ABNORMAL LOW (ref 13.0–17.0)
POTASSIUM: 5.4 mmol/L — AB (ref 3.5–5.1)
SODIUM: 140 mmol/L (ref 135–145)
TCO2: 27 mmol/L (ref 22–32)

## 2018-07-22 LAB — GLUCOSE, CAPILLARY
GLUCOSE-CAPILLARY: 84 mg/dL (ref 70–99)
Glucose-Capillary: 91 mg/dL (ref 70–99)
Glucose-Capillary: 79 mg/dL (ref 70–99)

## 2018-07-22 SURGERY — ARTERIOVENOUS (AV) FISTULA CREATION
Anesthesia: General | Site: Arm Upper | Laterality: Right

## 2018-07-22 SURGERY — A/V FISTULAGRAM
Anesthesia: LOCAL | Laterality: Right

## 2018-07-22 MED ORDER — CEFAZOLIN SODIUM 1 G IJ SOLR
INTRAMUSCULAR | Status: AC
  Filled 2018-07-22: qty 20

## 2018-07-22 MED ORDER — MIDAZOLAM HCL 2 MG/2ML IJ SOLN
INTRAMUSCULAR | Status: AC
  Filled 2018-07-22: qty 2

## 2018-07-22 MED ORDER — LIDOCAINE HCL (CARDIAC) PF 100 MG/5ML IV SOSY
PREFILLED_SYRINGE | INTRAVENOUS | Status: DC | PRN
  Administered 2018-07-22: 80 mg via INTRAVENOUS

## 2018-07-22 MED ORDER — LIDOCAINE 2% (20 MG/ML) 5 ML SYRINGE
INTRAMUSCULAR | Status: AC
  Filled 2018-07-22: qty 5

## 2018-07-22 MED ORDER — HYDROMORPHONE HCL 1 MG/ML IJ SOLN: 0.2500 mg | INTRAMUSCULAR | Status: DC | PRN

## 2018-07-22 MED ORDER — HEPARIN SODIUM (PORCINE) 1000 UNIT/ML IJ SOLN
INTRAMUSCULAR | Status: DC | PRN
  Administered 2018-07-22: 3000 [IU] via INTRAVENOUS

## 2018-07-22 MED ORDER — LIDOCAINE HCL (PF) 1 % IJ SOLN
INTRAMUSCULAR | Status: DC | PRN
  Administered 2018-07-22: 2 mL

## 2018-07-22 MED ORDER — HEPARIN (PORCINE) IN NACL 1000-0.9 UT/500ML-% IV SOLN
INTRAVENOUS | Status: AC
  Filled 2018-07-22: qty 500

## 2018-07-22 MED ORDER — ONDANSETRON HCL 4 MG/2ML IJ SOLN
INTRAMUSCULAR | Status: AC
  Filled 2018-07-22: qty 2

## 2018-07-22 MED ORDER — SODIUM CHLORIDE 0.9 % IV SOLN
INTRAVENOUS | Status: AC
  Filled 2018-07-22: qty 1.2

## 2018-07-22 MED ORDER — PROPOFOL 10 MG/ML IV BOLUS
INTRAVENOUS | Status: AC
  Filled 2018-07-22: qty 20

## 2018-07-22 MED ORDER — DEXAMETHASONE SODIUM PHOSPHATE 10 MG/ML IJ SOLN
INTRAMUSCULAR | Status: DC | PRN
  Administered 2018-07-22: 5 mg via INTRAVENOUS

## 2018-07-22 MED ORDER — DEXAMETHASONE SODIUM PHOSPHATE 10 MG/ML IJ SOLN
INTRAMUSCULAR | Status: AC
  Filled 2018-07-22: qty 1

## 2018-07-22 MED ORDER — LIDOCAINE HCL (PF) 1 % IJ SOLN
INTRAMUSCULAR | Status: AC
  Filled 2018-07-22: qty 30

## 2018-07-22 MED ORDER — FENTANYL CITRATE (PF) 250 MCG/5ML IJ SOLN
INTRAMUSCULAR | Status: AC
  Filled 2018-07-22: qty 5

## 2018-07-22 MED ORDER — 0.9 % SODIUM CHLORIDE (POUR BTL) OPTIME
TOPICAL | Status: DC | PRN
  Administered 2018-07-22: 1000 mL

## 2018-07-22 MED ORDER — HEPARIN (PORCINE) IN NACL 1000-0.9 UT/500ML-% IV SOLN
INTRAVENOUS | Status: DC | PRN
  Administered 2018-07-22: 500 mL

## 2018-07-22 MED ORDER — MIDAZOLAM HCL 2 MG/2ML IJ SOLN
INTRAMUSCULAR | Status: DC | PRN
  Administered 2018-07-22: 1 mg via INTRAVENOUS

## 2018-07-22 MED ORDER — ONDANSETRON HCL 4 MG/2ML IJ SOLN
INTRAMUSCULAR | Status: DC | PRN
  Administered 2018-07-22: 4 mg via INTRAVENOUS

## 2018-07-22 MED ORDER — PROPOFOL 10 MG/ML IV BOLUS
INTRAVENOUS | Status: DC | PRN
  Administered 2018-07-22: 150 mg via INTRAVENOUS

## 2018-07-22 MED ORDER — MIDAZOLAM HCL 2 MG/2ML IJ SOLN
INTRAMUSCULAR | Status: DC | PRN
  Administered 2018-07-22 (×2): 1 mg via INTRAVENOUS

## 2018-07-22 MED ORDER — SODIUM CHLORIDE 0.9 % IV SOLN
INTRAVENOUS | Status: DC
  Administered 2018-07-22: 15:00:00 via INTRAVENOUS

## 2018-07-22 MED ORDER — OXYCODONE-ACETAMINOPHEN 5-325 MG PO TABS: 1.0000 | ORAL_TABLET | Freq: Four times a day (QID) | ORAL | 0 refills | Status: DC | PRN

## 2018-07-22 MED ORDER — CEFAZOLIN SODIUM-DEXTROSE 2-3 GM-%(50ML) IV SOLR
INTRAVENOUS | Status: DC | PRN
  Administered 2018-07-22: 2 g via INTRAVENOUS

## 2018-07-22 MED ORDER — FENTANYL CITRATE (PF) 100 MCG/2ML IJ SOLN
INTRAMUSCULAR | Status: AC
  Filled 2018-07-22: qty 2

## 2018-07-22 MED ORDER — IODIXANOL 320 MG/ML IV SOLN
INTRAVENOUS | Status: DC | PRN
  Administered 2018-07-22: 60 mL via INTRAVENOUS

## 2018-07-22 MED ORDER — SODIUM CHLORIDE 0.9% FLUSH: 3.0000 mL | INTRAVENOUS | Status: DC | PRN

## 2018-07-22 MED ORDER — SODIUM CHLORIDE 0.9 % IV SOLN
INTRAVENOUS | Status: DC | PRN
  Administered 2018-07-22: 500 mL

## 2018-07-22 MED ORDER — FENTANYL CITRATE (PF) 100 MCG/2ML IJ SOLN
INTRAMUSCULAR | Status: DC | PRN
  Administered 2018-07-22: 25 ug via INTRAVENOUS

## 2018-07-22 MED ORDER — HEPARIN SODIUM (PORCINE) 1000 UNIT/ML IJ SOLN
INTRAMUSCULAR | Status: AC
  Filled 2018-07-22: qty 1

## 2018-07-22 MED ORDER — PHENYLEPHRINE 40 MCG/ML (10ML) SYRINGE FOR IV PUSH (FOR BLOOD PRESSURE SUPPORT)
PREFILLED_SYRINGE | INTRAVENOUS | Status: DC | PRN
  Administered 2018-07-22: 120 ug via INTRAVENOUS
  Administered 2018-07-22: 80 ug via INTRAVENOUS
  Administered 2018-07-22: 120 ug via INTRAVENOUS
  Administered 2018-07-22: 80 ug via INTRAVENOUS

## 2018-07-22 MED ORDER — FENTANYL CITRATE (PF) 250 MCG/5ML IJ SOLN
INTRAMUSCULAR | Status: DC | PRN
  Administered 2018-07-22: 100 ug via INTRAVENOUS

## 2018-07-22 SURGICAL SUPPLY — 20 items
BAG SNAP BAND KOVER 36X36 (MISCELLANEOUS) ×3 IMPLANT
BALLN ATLAS 14X40X75 (BALLOONS) ×3
BALLN MUSTANG 10X60X75 (BALLOONS) ×3
BALLOON ATLAS 14X40X75 (BALLOONS) ×2 IMPLANT
BALLOON MUSTANG 10X60X75 (BALLOONS) ×2 IMPLANT
CATH ANGIO 5F BER2 65CM (CATHETERS) ×3 IMPLANT
COVER DOME SNAP 22 D (MISCELLANEOUS) ×3 IMPLANT
DEVICE TORQUE H2O (MISCELLANEOUS) ×3 IMPLANT
GUIDEWIRE ANGLED .035X150CM (WIRE) ×3 IMPLANT
KIT ENCORE 26 ADVANTAGE (KITS) ×3 IMPLANT
KIT MICROPUNCTURE NIT STIFF (SHEATH) ×3 IMPLANT
PROTECTION STATION PRESSURIZED (MISCELLANEOUS) ×3
SHEATH PINNACLE R/O II 7F 4CM (SHEATH) ×3 IMPLANT
SHEATH PROBE COVER 6X72 (BAG) ×6 IMPLANT
STATION PROTECTION PRESSURIZED (MISCELLANEOUS) ×2 IMPLANT
STOPCOCK MORSE 400PSI 3WAY (MISCELLANEOUS) ×3 IMPLANT
TRAY PV CATH (CUSTOM PROCEDURE TRAY) ×3 IMPLANT
TUBING CIL FLEX 10 FLL-RA (TUBING) ×6 IMPLANT
WIRE BENTSON .035X145CM (WIRE) ×3 IMPLANT
WIRE ROSEN-J .035X180CM (WIRE) ×3 IMPLANT

## 2018-07-22 SURGICAL SUPPLY — 31 items
ARMBAND PINK RESTRICT EXTREMIT (MISCELLANEOUS) ×6 IMPLANT
CANISTER SUCT 3000ML PPV (MISCELLANEOUS) ×3 IMPLANT
CLIP VESOCCLUDE MED 6/CT (CLIP) ×3 IMPLANT
CLIP VESOCCLUDE SM WIDE 6/CT (CLIP) ×3 IMPLANT
COVER PROBE W GEL 5X96 (DRAPES) ×3 IMPLANT
COVER WAND RF STERILE (DRAPES) ×3 IMPLANT
DERMABOND ADVANCED (GAUZE/BANDAGES/DRESSINGS) ×2
DERMABOND ADVANCED .7 DNX12 (GAUZE/BANDAGES/DRESSINGS) ×1 IMPLANT
ELECT REM PT RETURN 9FT ADLT (ELECTROSURGICAL) ×3
ELECTRODE REM PT RTRN 9FT ADLT (ELECTROSURGICAL) ×1 IMPLANT
GLOVE BIOGEL PI IND STRL 7.5 (GLOVE) ×1 IMPLANT
GLOVE BIOGEL PI INDICATOR 7.5 (GLOVE) ×2
GLOVE SURG SS PI 7.5 STRL IVOR (GLOVE) ×3 IMPLANT
GOWN STRL REUS W/ TWL LRG LVL3 (GOWN DISPOSABLE) ×2 IMPLANT
GOWN STRL REUS W/ TWL XL LVL3 (GOWN DISPOSABLE) ×1 IMPLANT
GOWN STRL REUS W/TWL LRG LVL3 (GOWN DISPOSABLE) ×4
GOWN STRL REUS W/TWL XL LVL3 (GOWN DISPOSABLE) ×2
HEMOSTAT SNOW SURGICEL 2X4 (HEMOSTASIS) IMPLANT
KIT BASIN OR (CUSTOM PROCEDURE TRAY) ×3 IMPLANT
KIT TURNOVER KIT B (KITS) ×3 IMPLANT
NS IRRIG 1000ML POUR BTL (IV SOLUTION) ×3 IMPLANT
PACK CV ACCESS (CUSTOM PROCEDURE TRAY) ×3 IMPLANT
PAD ARMBOARD 7.5X6 YLW CONV (MISCELLANEOUS) ×6 IMPLANT
SUT PROLENE 4 0 SH DA (SUTURE) ×3 IMPLANT
SUT PROLENE 6 0 CC (SUTURE) ×3 IMPLANT
SUT VIC AB 3-0 SH 27 (SUTURE) ×2
SUT VIC AB 3-0 SH 27X BRD (SUTURE) ×1 IMPLANT
SUT VICRYL 4-0 PS2 18IN ABS (SUTURE) ×3 IMPLANT
TOWEL GREEN STERILE (TOWEL DISPOSABLE) ×3 IMPLANT
UNDERPAD 30X30 (UNDERPADS AND DIAPERS) ×3 IMPLANT
WATER STERILE IRR 1000ML POUR (IV SOLUTION) ×3 IMPLANT

## 2018-07-22 NOTE — Op Note (Signed)
    Patient name: Alejandro Lee MRN: [id number] DOB: 1960/10/29 Sex: male  07/22/2018 Pre-operative Diagnosis: End-stage renal disease, pseudoaneurysm right arm AV fistula Post-operative diagnosis:  Same Surgeon:  Eda Paschal. Donzetta Matters, MD Assistant: Laurence Slate, PA Procedure Performed:  Plication right arm AV fistula  Indications: 57 year old male with end-stage renal disease.  He has a right arm AV fistula for which he dialyzes which has had some bleeding.  He underwent fistulogram balloon angioplasty of a central venous stenosis earlier today.  He is now indicated for revision of the fistula with plication.  Findings: There was a large pseudoaneurysm.  This was plicated.  At completion there was a thrill in the fistula with what appears to be enough room for continued dialysis via the fistula without need for catheter.   Procedure:  The patient was identified in the holding area and taken to the operating room where he was placed supine operative table and general anesthesia induced.  He was sterilely prepped draped the right upper extremity usual fashion and a timeout was called.  Antibiotics were administered as was 3000 units of heparin.  A tourniquet was placed to the level of the most cephalad aspect of the arm.  Esmarch was used to exsanguinate the right upper extremity and tourniquet was inflated 250 mmHg.  An elliptical incision was made around the concerning area of skin.  I then put out the affected area of fistula dissected out skin around the.  We then closed the fistula with running 4-0 Prolene in a mattress configuration.  Prior to completing anastomosis I then flushed the fistula with heparinized saline.  Tourniquet was let down.  We completed the suture line.  Obtained hemostasis irrigated the wound place interrupted 3-0 Vicryl sutures closed the skin with 4-0 Monocryl.  Dermabond placed to level skin.  Was allowed awaken anesthesia having tolerated procedure well without immediate  complication.  EBL: 10 cc   Savas Elvin C. Donzetta Matters, MD [facility] Office: [phone] Pager: [phone]

## 2018-07-22 NOTE — Progress Notes (Signed)
   I reviewed the fistulogram from today and evaluated the patient's right upper extremity which demonstrates very thin skin with ulceration over a very large pseudoaneurysm.  Will plan plication of fistula today.  Appears to be enough fistula remaining for cannulation with dialysis tomorrow.  I discussed risk and benefits with patient he agrees to proceed.  Rushil Kimbrell C. Donzetta Matters, MD [facility] [place] [place] [facility] Office: [phone] Pager: [phone]

## 2018-07-22 NOTE — Transfer of Care (Signed)
Immediate Anesthesia Transfer of Care Note  Patient: Alejandro Lee  Procedure(s) Performed: PLICATION OF RIGHT arm FISTULA (Right Arm Upper)  Patient Location: PACU  Anesthesia Type:General  Level of Consciousness: awake, alert , oriented and patient cooperative  Airway & Oxygen Therapy: Patient Spontanous Breathing  Post-op Assessment: Report given to RN and Post -op Vital signs reviewed and stable  Post vital signs: Reviewed and stable  Last Vitals:  Vitals Value Taken Time  BP 174/85 07/22/2018  4:15 PM  Temp 36.5 C 07/22/2018  4:15 PM  Pulse 84 07/22/2018  4:17 PM  Resp 14 07/22/2018  4:17 PM  SpO2 96 % 07/22/2018  4:17 PM  Vitals shown include unvalidated device data.  Last Pain:  Vitals:   07/22/18 1031  TempSrc:   PainSc: 7       Patients Stated Pain Goal: 3 (77/93/90 3009)  Complications: No apparent anesthesia complications

## 2018-07-22 NOTE — Anesthesia Preprocedure Evaluation (Signed)
Anesthesia Evaluation  Patient identified by MRN, date of birth, ID band Patient awake    Reviewed: Allergy & Precautions, H&P , NPO status , Patient's Chart, lab work & pertinent test results  Airway Mallampati: II  TM Distance: >3 FB Neck ROM: Full    Dental no notable dental hx. (+) Teeth Intact, Dental Advisory Given   Pulmonary neg pulmonary ROS, former smoker,    Pulmonary exam normal breath sounds clear to auscultation       Cardiovascular hypertension, Pt. on medications +CHF   Rhythm:Regular Rate:Normal     Neuro/Psych  Headaches, negative psych ROS   GI/Hepatic Neg liver ROS, GERD  Controlled,  Endo/Other  diabetes  Renal/GU ESRF and DialysisRenal disease  negative genitourinary   Musculoskeletal   Abdominal   Peds  Hematology negative hematology ROS (+)   Anesthesia Other Findings   Reproductive/Obstetrics negative OB ROS                             Anesthesia Physical Anesthesia Plan  ASA: III  Anesthesia Plan: General   Post-op Pain Management:    Induction: Intravenous  PONV Risk Score and Plan: 3 and Ondansetron, Dexamethasone and Midazolam  Airway Management Planned: LMA  Additional Equipment:   Intra-op Plan:   Post-operative Plan: Extubation in OR  Informed Consent: I have reviewed the patients History and Physical, chart, labs and discussed the procedure including the risks, benefits and alternatives for the proposed anesthesia with the patient or authorized representative who has indicated his/her understanding and acceptance.   Dental advisory given  Plan Discussed with: CRNA  Anesthesia Plan Comments:         Anesthesia Quick Evaluation

## 2018-07-22 NOTE — Op Note (Signed)
    Patient name: Alejandro Lee MRN: [id number] DOB: 1960-12-11 Sex: male  07/22/2018 Pre-operative Diagnosis: ESRD Post-operative diagnosis:  Same Surgeon:  Annamarie Major Procedure Performed:  1.  Ultrasound-guided access, right arm fistula  2.  Fistulogram  3.  Venoplasty, right innominate vein  4.  Venoplasty, right subclavian vein  5.  Conscious sedation (34 minutes)    Indications: The patient had his fistula placed in [place].  He has previously undergone plication of an aneurysm.  He has a second aneurysm and has prolonged bleeding.  He comes in today for fistulogram.  Procedure:  The patient was identified in the holding area and taken to room 8.  The patient was then placed supine on the table and prepped and draped in the usual sterile fashion.  A time out was called.  Conscious sedation was administered with use of IV fentanyl Versed under continuous physician and nurse monitoring.  Heart, blood pressure, not saturation continue to monitor.  Ultrasound was used to evaluate the fistula.  The vein was patent and compressible.  A digital ultrasound image was acquired.  The fistula was then accessed under ultrasound guidance using a micropuncture needle.  An 018 wire was then asvanced without resistance and a micropuncture sheath was placed.  Contrast injections were then performed through the sheath.  Findings: Aneurysmal changes seen at the fistula near the arterial venous anastomosis.  The fistula is widely patent however in the central venous system in the right innominate and right subclavian vein there are significant stenosis, greater than 80%.   Intervention: After the above images were acquired the decision made to proceed with intervention.  A 7 French sheath was inserted.  No heparin was given.  I used a Berenstein 2 cath and a Glidewire to cross the lesion.  I first proceeded with balloon angioplasty of the right innominate vein and right subclavian vein using a 10 x 60  Mustang balloon.  Follow-up imaging showed significant improved results but there was still residual narrowing and so I inserted a 14 x 40 Atlas balloon and performed balloon angioplasty of the innominate vein and the right subclavian vein on 2 separate inflations taking it to nominal pressure.  Completion imaging showed significant improvement in the central venous stenosis, now less than 10%.  Catheters and wires were removed.  Suture closure for the arteriotomy site was performed.    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 Annamarie Major, M.D. [facility] Office: [phone] Pager:  [phone]

## 2018-07-22 NOTE — Interval H&P Note (Signed)
History and Physical Interval Note:  07/22/2018 9:14 AM  Alejandro Lee  has presented today for surgery, with the diagnosis of complication w/ fistula  The various methods of treatment have been discussed with the patient and family. After consideration of risks, benefits and other options for treatment, the patient has consented to  Procedure(s): A/V FISTULAGRAM (Right) as a surgical intervention .  The patient's history has been reviewed, patient examined, no change in status, stable for surgery.  I have reviewed the patient's chart and labs.  Questions were answered to the patient's satisfaction.     Annamarie Major

## 2018-07-22 NOTE — Anesthesia Postprocedure Evaluation (Signed)
Anesthesia Post Note  Patient: Alejandro Lee  Procedure(s) Performed: PLICATION OF RIGHT arm FISTULA (Right Arm Upper)     Patient location during evaluation: PACU Anesthesia Type: General Level of consciousness: sedated Pain management: pain level controlled Vital Signs Assessment: post-procedure vital signs reviewed and stable Respiratory status: spontaneous breathing and respiratory function stable Cardiovascular status: stable Postop Assessment: no apparent nausea or vomiting Anesthetic complications: no    Last Vitals:  Vitals:   07/22/18 1645 07/22/18 1700  BP: (!) 171/80 (!) 166/84  Pulse: 79 79  Resp: 11 12  Temp:  36.6 C  SpO2: 95% 92%    Last Pain:  Vitals:   07/22/18 1615  TempSrc:   PainSc: [place]

## 2018-07-22 NOTE — Anesthesia Procedure Notes (Signed)
Procedure Name: LMA Insertion Date/Time: 07/22/2018 3:28 PM Performed by: Raenette Rover, CRNA Pre-anesthesia Checklist: Patient identified, Emergency Drugs available, Suction available and Patient being monitored Patient Re-evaluated:Patient Re-evaluated prior to induction Oxygen Delivery Method: Circle system utilized Preoxygenation: Pre-oxygenation with 100% oxygen Induction Type: IV induction LMA: LMA inserted LMA Size: 4.0 Number of attempts: 1 Placement Confirmation: positive ETCO2,  CO2 detector and breath sounds checked- equal and bilateral Tube secured with: Tape Dental Injury: Teeth and Oropharynx as per pre-operative assessment

## 2018-07-23 ENCOUNTER — Encounter (HOSPITAL_COMMUNITY): Payer: Self-pay | Admitting: Vascular Surgery

## 2018-07-23 DIAGNOSIS — Z992 Dependence on renal dialysis: Secondary | ICD-10-CM | POA: Diagnosis not present

## 2018-07-23 DIAGNOSIS — N186 End stage renal disease: Secondary | ICD-10-CM | POA: Diagnosis not present

## 2018-07-23 DIAGNOSIS — D509 Iron deficiency anemia, unspecified: Secondary | ICD-10-CM | POA: Diagnosis not present

## 2018-07-23 DIAGNOSIS — N2581 Secondary hyperparathyroidism of renal origin: Secondary | ICD-10-CM | POA: Diagnosis not present

## 2018-07-23 DIAGNOSIS — D631 Anemia in chronic kidney disease: Secondary | ICD-10-CM | POA: Diagnosis not present

## 2018-07-25 DIAGNOSIS — N186 End stage renal disease: Secondary | ICD-10-CM | POA: Diagnosis not present

## 2018-07-25 DIAGNOSIS — N2581 Secondary hyperparathyroidism of renal origin: Secondary | ICD-10-CM | POA: Diagnosis not present

## 2018-07-25 DIAGNOSIS — D509 Iron deficiency anemia, unspecified: Secondary | ICD-10-CM | POA: Diagnosis not present

## 2018-07-25 DIAGNOSIS — D631 Anemia in chronic kidney disease: Secondary | ICD-10-CM | POA: Diagnosis not present

## 2018-07-25 DIAGNOSIS — Z992 Dependence on renal dialysis: Secondary | ICD-10-CM | POA: Diagnosis not present

## 2018-07-28 DIAGNOSIS — N2581 Secondary hyperparathyroidism of renal origin: Secondary | ICD-10-CM | POA: Diagnosis not present

## 2018-07-28 DIAGNOSIS — Z992 Dependence on renal dialysis: Secondary | ICD-10-CM | POA: Diagnosis not present

## 2018-07-28 DIAGNOSIS — D631 Anemia in chronic kidney disease: Secondary | ICD-10-CM | POA: Diagnosis not present

## 2018-07-28 DIAGNOSIS — N186 End stage renal disease: Secondary | ICD-10-CM | POA: Diagnosis not present

## 2018-07-28 DIAGNOSIS — D509 Iron deficiency anemia, unspecified: Secondary | ICD-10-CM | POA: Diagnosis not present

## 2018-07-30 DIAGNOSIS — N2581 Secondary hyperparathyroidism of renal origin: Secondary | ICD-10-CM | POA: Diagnosis not present

## 2018-07-30 DIAGNOSIS — D509 Iron deficiency anemia, unspecified: Secondary | ICD-10-CM | POA: Diagnosis not present

## 2018-07-30 DIAGNOSIS — Z992 Dependence on renal dialysis: Secondary | ICD-10-CM | POA: Diagnosis not present

## 2018-07-30 DIAGNOSIS — N186 End stage renal disease: Secondary | ICD-10-CM | POA: Diagnosis not present

## 2018-07-30 DIAGNOSIS — D631 Anemia in chronic kidney disease: Secondary | ICD-10-CM | POA: Diagnosis not present

## 2018-08-01 DIAGNOSIS — Z992 Dependence on renal dialysis: Secondary | ICD-10-CM | POA: Diagnosis not present

## 2018-08-01 DIAGNOSIS — D509 Iron deficiency anemia, unspecified: Secondary | ICD-10-CM | POA: Diagnosis not present

## 2018-08-01 DIAGNOSIS — N186 End stage renal disease: Secondary | ICD-10-CM | POA: Diagnosis not present

## 2018-08-01 DIAGNOSIS — N2581 Secondary hyperparathyroidism of renal origin: Secondary | ICD-10-CM | POA: Diagnosis not present

## 2018-08-01 DIAGNOSIS — D631 Anemia in chronic kidney disease: Secondary | ICD-10-CM | POA: Diagnosis not present

## 2018-08-03 DIAGNOSIS — N2581 Secondary hyperparathyroidism of renal origin: Secondary | ICD-10-CM | POA: Diagnosis not present

## 2018-08-03 DIAGNOSIS — D631 Anemia in chronic kidney disease: Secondary | ICD-10-CM | POA: Diagnosis not present

## 2018-08-03 DIAGNOSIS — D509 Iron deficiency anemia, unspecified: Secondary | ICD-10-CM | POA: Diagnosis not present

## 2018-08-03 DIAGNOSIS — Z992 Dependence on renal dialysis: Secondary | ICD-10-CM | POA: Diagnosis not present

## 2018-08-03 DIAGNOSIS — N186 End stage renal disease: Secondary | ICD-10-CM | POA: Diagnosis not present

## 2018-08-04 ENCOUNTER — Encounter (HOSPITAL_COMMUNITY): Payer: Self-pay | Admitting: Vascular Surgery

## 2018-08-05 DIAGNOSIS — N2581 Secondary hyperparathyroidism of renal origin: Secondary | ICD-10-CM | POA: Diagnosis not present

## 2018-08-05 DIAGNOSIS — Z992 Dependence on renal dialysis: Secondary | ICD-10-CM | POA: Diagnosis not present

## 2018-08-05 DIAGNOSIS — N186 End stage renal disease: Secondary | ICD-10-CM | POA: Diagnosis not present

## 2018-08-05 DIAGNOSIS — D509 Iron deficiency anemia, unspecified: Secondary | ICD-10-CM | POA: Diagnosis not present

## 2018-08-05 DIAGNOSIS — D631 Anemia in chronic kidney disease: Secondary | ICD-10-CM | POA: Diagnosis not present

## 2018-08-08 DIAGNOSIS — N186 End stage renal disease: Secondary | ICD-10-CM | POA: Diagnosis not present

## 2018-08-08 DIAGNOSIS — N2581 Secondary hyperparathyroidism of renal origin: Secondary | ICD-10-CM | POA: Diagnosis not present

## 2018-08-08 DIAGNOSIS — D509 Iron deficiency anemia, unspecified: Secondary | ICD-10-CM | POA: Diagnosis not present

## 2018-08-08 DIAGNOSIS — D631 Anemia in chronic kidney disease: Secondary | ICD-10-CM | POA: Diagnosis not present

## 2018-08-08 DIAGNOSIS — Z992 Dependence on renal dialysis: Secondary | ICD-10-CM | POA: Diagnosis not present

## 2018-08-11 DIAGNOSIS — N186 End stage renal disease: Secondary | ICD-10-CM | POA: Diagnosis not present

## 2018-08-11 DIAGNOSIS — Z992 Dependence on renal dialysis: Secondary | ICD-10-CM | POA: Diagnosis not present

## 2018-08-11 DIAGNOSIS — N2581 Secondary hyperparathyroidism of renal origin: Secondary | ICD-10-CM | POA: Diagnosis not present

## 2018-08-11 DIAGNOSIS — D509 Iron deficiency anemia, unspecified: Secondary | ICD-10-CM | POA: Diagnosis not present

## 2018-08-11 DIAGNOSIS — D631 Anemia in chronic kidney disease: Secondary | ICD-10-CM | POA: Diagnosis not present

## 2018-08-12 DIAGNOSIS — N186 End stage renal disease: Secondary | ICD-10-CM | POA: Diagnosis not present

## 2018-08-12 DIAGNOSIS — Z992 Dependence on renal dialysis: Secondary | ICD-10-CM | POA: Diagnosis not present

## 2018-08-15 DIAGNOSIS — Z992 Dependence on renal dialysis: Secondary | ICD-10-CM | POA: Diagnosis not present

## 2018-08-15 DIAGNOSIS — N186 End stage renal disease: Secondary | ICD-10-CM | POA: Diagnosis not present

## 2018-08-15 DIAGNOSIS — N2581 Secondary hyperparathyroidism of renal origin: Secondary | ICD-10-CM | POA: Diagnosis not present

## 2018-08-15 DIAGNOSIS — D509 Iron deficiency anemia, unspecified: Secondary | ICD-10-CM | POA: Diagnosis not present

## 2018-08-15 DIAGNOSIS — D631 Anemia in chronic kidney disease: Secondary | ICD-10-CM | POA: Diagnosis not present

## 2018-08-18 DIAGNOSIS — Z992 Dependence on renal dialysis: Secondary | ICD-10-CM | POA: Diagnosis not present

## 2018-08-18 DIAGNOSIS — N186 End stage renal disease: Secondary | ICD-10-CM | POA: Diagnosis not present

## 2018-08-18 DIAGNOSIS — N2581 Secondary hyperparathyroidism of renal origin: Secondary | ICD-10-CM | POA: Diagnosis not present

## 2018-08-18 DIAGNOSIS — D509 Iron deficiency anemia, unspecified: Secondary | ICD-10-CM | POA: Diagnosis not present

## 2018-08-18 DIAGNOSIS — D631 Anemia in chronic kidney disease: Secondary | ICD-10-CM | POA: Diagnosis not present

## 2018-08-20 DIAGNOSIS — D509 Iron deficiency anemia, unspecified: Secondary | ICD-10-CM | POA: Diagnosis not present

## 2018-08-20 DIAGNOSIS — N2581 Secondary hyperparathyroidism of renal origin: Secondary | ICD-10-CM | POA: Diagnosis not present

## 2018-08-20 DIAGNOSIS — D631 Anemia in chronic kidney disease: Secondary | ICD-10-CM | POA: Diagnosis not present

## 2018-08-20 DIAGNOSIS — N186 End stage renal disease: Secondary | ICD-10-CM | POA: Diagnosis not present

## 2018-08-20 DIAGNOSIS — Z992 Dependence on renal dialysis: Secondary | ICD-10-CM | POA: Diagnosis not present

## 2018-08-22 DIAGNOSIS — D509 Iron deficiency anemia, unspecified: Secondary | ICD-10-CM | POA: Diagnosis not present

## 2018-08-22 DIAGNOSIS — Z992 Dependence on renal dialysis: Secondary | ICD-10-CM | POA: Diagnosis not present

## 2018-08-22 DIAGNOSIS — N2581 Secondary hyperparathyroidism of renal origin: Secondary | ICD-10-CM | POA: Diagnosis not present

## 2018-08-22 DIAGNOSIS — N186 End stage renal disease: Secondary | ICD-10-CM | POA: Diagnosis not present

## 2018-08-22 DIAGNOSIS — D631 Anemia in chronic kidney disease: Secondary | ICD-10-CM | POA: Diagnosis not present

## 2018-08-25 DIAGNOSIS — N2581 Secondary hyperparathyroidism of renal origin: Secondary | ICD-10-CM | POA: Diagnosis not present

## 2018-08-25 DIAGNOSIS — D509 Iron deficiency anemia, unspecified: Secondary | ICD-10-CM | POA: Diagnosis not present

## 2018-08-25 DIAGNOSIS — Z992 Dependence on renal dialysis: Secondary | ICD-10-CM | POA: Diagnosis not present

## 2018-08-25 DIAGNOSIS — N186 End stage renal disease: Secondary | ICD-10-CM | POA: Diagnosis not present

## 2018-08-25 DIAGNOSIS — D631 Anemia in chronic kidney disease: Secondary | ICD-10-CM | POA: Diagnosis not present

## 2018-08-27 DIAGNOSIS — N2581 Secondary hyperparathyroidism of renal origin: Secondary | ICD-10-CM | POA: Diagnosis not present

## 2018-08-27 DIAGNOSIS — D631 Anemia in chronic kidney disease: Secondary | ICD-10-CM | POA: Diagnosis not present

## 2018-08-27 DIAGNOSIS — D509 Iron deficiency anemia, unspecified: Secondary | ICD-10-CM | POA: Diagnosis not present

## 2018-08-27 DIAGNOSIS — N186 End stage renal disease: Secondary | ICD-10-CM | POA: Diagnosis not present

## 2018-08-27 DIAGNOSIS — Z992 Dependence on renal dialysis: Secondary | ICD-10-CM | POA: Diagnosis not present

## 2018-08-29 DIAGNOSIS — N186 End stage renal disease: Secondary | ICD-10-CM | POA: Diagnosis not present

## 2018-08-29 DIAGNOSIS — D509 Iron deficiency anemia, unspecified: Secondary | ICD-10-CM | POA: Diagnosis not present

## 2018-08-29 DIAGNOSIS — D631 Anemia in chronic kidney disease: Secondary | ICD-10-CM | POA: Diagnosis not present

## 2018-08-29 DIAGNOSIS — Z992 Dependence on renal dialysis: Secondary | ICD-10-CM | POA: Diagnosis not present

## 2018-08-29 DIAGNOSIS — N2581 Secondary hyperparathyroidism of renal origin: Secondary | ICD-10-CM | POA: Diagnosis not present

## 2018-09-01 DIAGNOSIS — Z992 Dependence on renal dialysis: Secondary | ICD-10-CM | POA: Diagnosis not present

## 2018-09-01 DIAGNOSIS — D509 Iron deficiency anemia, unspecified: Secondary | ICD-10-CM | POA: Diagnosis not present

## 2018-09-01 DIAGNOSIS — N2581 Secondary hyperparathyroidism of renal origin: Secondary | ICD-10-CM | POA: Diagnosis not present

## 2018-09-01 DIAGNOSIS — D631 Anemia in chronic kidney disease: Secondary | ICD-10-CM | POA: Diagnosis not present

## 2018-09-01 DIAGNOSIS — N186 End stage renal disease: Secondary | ICD-10-CM | POA: Diagnosis not present

## 2018-09-03 DIAGNOSIS — D509 Iron deficiency anemia, unspecified: Secondary | ICD-10-CM | POA: Diagnosis not present

## 2018-09-03 DIAGNOSIS — N186 End stage renal disease: Secondary | ICD-10-CM | POA: Diagnosis not present

## 2018-09-03 DIAGNOSIS — D631 Anemia in chronic kidney disease: Secondary | ICD-10-CM | POA: Diagnosis not present

## 2018-09-03 DIAGNOSIS — Z992 Dependence on renal dialysis: Secondary | ICD-10-CM | POA: Diagnosis not present

## 2018-09-03 DIAGNOSIS — N2581 Secondary hyperparathyroidism of renal origin: Secondary | ICD-10-CM | POA: Diagnosis not present

## 2018-09-05 DIAGNOSIS — N2581 Secondary hyperparathyroidism of renal origin: Secondary | ICD-10-CM | POA: Diagnosis not present

## 2018-09-05 DIAGNOSIS — D631 Anemia in chronic kidney disease: Secondary | ICD-10-CM | POA: Diagnosis not present

## 2018-09-05 DIAGNOSIS — N186 End stage renal disease: Secondary | ICD-10-CM | POA: Diagnosis not present

## 2018-09-05 DIAGNOSIS — D509 Iron deficiency anemia, unspecified: Secondary | ICD-10-CM | POA: Diagnosis not present

## 2018-09-05 DIAGNOSIS — Z992 Dependence on renal dialysis: Secondary | ICD-10-CM | POA: Diagnosis not present

## 2018-09-08 DIAGNOSIS — Z992 Dependence on renal dialysis: Secondary | ICD-10-CM | POA: Diagnosis not present

## 2018-09-08 DIAGNOSIS — N2581 Secondary hyperparathyroidism of renal origin: Secondary | ICD-10-CM | POA: Diagnosis not present

## 2018-09-08 DIAGNOSIS — D509 Iron deficiency anemia, unspecified: Secondary | ICD-10-CM | POA: Diagnosis not present

## 2018-09-08 DIAGNOSIS — D631 Anemia in chronic kidney disease: Secondary | ICD-10-CM | POA: Diagnosis not present

## 2018-09-08 DIAGNOSIS — N186 End stage renal disease: Secondary | ICD-10-CM | POA: Diagnosis not present

## 2018-09-10 DIAGNOSIS — N2581 Secondary hyperparathyroidism of renal origin: Secondary | ICD-10-CM | POA: Diagnosis not present

## 2018-09-10 DIAGNOSIS — Z992 Dependence on renal dialysis: Secondary | ICD-10-CM | POA: Diagnosis not present

## 2018-09-10 DIAGNOSIS — D509 Iron deficiency anemia, unspecified: Secondary | ICD-10-CM | POA: Diagnosis not present

## 2018-09-10 DIAGNOSIS — D631 Anemia in chronic kidney disease: Secondary | ICD-10-CM | POA: Diagnosis not present

## 2018-09-10 DIAGNOSIS — N186 End stage renal disease: Secondary | ICD-10-CM | POA: Diagnosis not present

## 2018-09-12 DIAGNOSIS — N186 End stage renal disease: Secondary | ICD-10-CM | POA: Diagnosis not present

## 2018-09-12 DIAGNOSIS — D509 Iron deficiency anemia, unspecified: Secondary | ICD-10-CM | POA: Diagnosis not present

## 2018-09-12 DIAGNOSIS — N2581 Secondary hyperparathyroidism of renal origin: Secondary | ICD-10-CM | POA: Diagnosis not present

## 2018-09-12 DIAGNOSIS — Z992 Dependence on renal dialysis: Secondary | ICD-10-CM | POA: Diagnosis not present

## 2018-09-12 DIAGNOSIS — D631 Anemia in chronic kidney disease: Secondary | ICD-10-CM | POA: Diagnosis not present

## 2018-09-15 DIAGNOSIS — D631 Anemia in chronic kidney disease: Secondary | ICD-10-CM | POA: Diagnosis not present

## 2018-09-15 DIAGNOSIS — N186 End stage renal disease: Secondary | ICD-10-CM | POA: Diagnosis not present

## 2018-09-15 DIAGNOSIS — D509 Iron deficiency anemia, unspecified: Secondary | ICD-10-CM | POA: Diagnosis not present

## 2018-09-15 DIAGNOSIS — Z992 Dependence on renal dialysis: Secondary | ICD-10-CM | POA: Diagnosis not present

## 2018-09-15 DIAGNOSIS — N2581 Secondary hyperparathyroidism of renal origin: Secondary | ICD-10-CM | POA: Diagnosis not present

## 2018-09-17 DIAGNOSIS — Z992 Dependence on renal dialysis: Secondary | ICD-10-CM | POA: Diagnosis not present

## 2018-09-17 DIAGNOSIS — N2581 Secondary hyperparathyroidism of renal origin: Secondary | ICD-10-CM | POA: Diagnosis not present

## 2018-09-17 DIAGNOSIS — D631 Anemia in chronic kidney disease: Secondary | ICD-10-CM | POA: Diagnosis not present

## 2018-09-17 DIAGNOSIS — N186 End stage renal disease: Secondary | ICD-10-CM | POA: Diagnosis not present

## 2018-09-17 DIAGNOSIS — D509 Iron deficiency anemia, unspecified: Secondary | ICD-10-CM | POA: Diagnosis not present

## 2018-09-19 DIAGNOSIS — N186 End stage renal disease: Secondary | ICD-10-CM | POA: Diagnosis not present

## 2018-09-19 DIAGNOSIS — N2581 Secondary hyperparathyroidism of renal origin: Secondary | ICD-10-CM | POA: Diagnosis not present

## 2018-09-19 DIAGNOSIS — D631 Anemia in chronic kidney disease: Secondary | ICD-10-CM | POA: Diagnosis not present

## 2018-09-19 DIAGNOSIS — Z992 Dependence on renal dialysis: Secondary | ICD-10-CM | POA: Diagnosis not present

## 2018-09-19 DIAGNOSIS — D509 Iron deficiency anemia, unspecified: Secondary | ICD-10-CM | POA: Diagnosis not present

## 2018-09-22 DIAGNOSIS — N2581 Secondary hyperparathyroidism of renal origin: Secondary | ICD-10-CM | POA: Diagnosis not present

## 2018-09-22 DIAGNOSIS — D631 Anemia in chronic kidney disease: Secondary | ICD-10-CM | POA: Diagnosis not present

## 2018-09-22 DIAGNOSIS — N186 End stage renal disease: Secondary | ICD-10-CM | POA: Diagnosis not present

## 2018-09-22 DIAGNOSIS — D509 Iron deficiency anemia, unspecified: Secondary | ICD-10-CM | POA: Diagnosis not present

## 2018-09-22 DIAGNOSIS — Z992 Dependence on renal dialysis: Secondary | ICD-10-CM | POA: Diagnosis not present

## 2018-09-24 DIAGNOSIS — N186 End stage renal disease: Secondary | ICD-10-CM | POA: Diagnosis not present

## 2018-09-24 DIAGNOSIS — D631 Anemia in chronic kidney disease: Secondary | ICD-10-CM | POA: Diagnosis not present

## 2018-09-24 DIAGNOSIS — D509 Iron deficiency anemia, unspecified: Secondary | ICD-10-CM | POA: Diagnosis not present

## 2018-09-24 DIAGNOSIS — N2581 Secondary hyperparathyroidism of renal origin: Secondary | ICD-10-CM | POA: Diagnosis not present

## 2018-09-24 DIAGNOSIS — Z992 Dependence on renal dialysis: Secondary | ICD-10-CM | POA: Diagnosis not present

## 2018-09-26 DIAGNOSIS — Z992 Dependence on renal dialysis: Secondary | ICD-10-CM | POA: Diagnosis not present

## 2018-09-26 DIAGNOSIS — N2581 Secondary hyperparathyroidism of renal origin: Secondary | ICD-10-CM | POA: Diagnosis not present

## 2018-09-26 DIAGNOSIS — D509 Iron deficiency anemia, unspecified: Secondary | ICD-10-CM | POA: Diagnosis not present

## 2018-09-26 DIAGNOSIS — N186 End stage renal disease: Secondary | ICD-10-CM | POA: Diagnosis not present

## 2018-09-26 DIAGNOSIS — D631 Anemia in chronic kidney disease: Secondary | ICD-10-CM | POA: Diagnosis not present

## 2018-09-29 DIAGNOSIS — Z992 Dependence on renal dialysis: Secondary | ICD-10-CM | POA: Diagnosis not present

## 2018-09-29 DIAGNOSIS — D631 Anemia in chronic kidney disease: Secondary | ICD-10-CM | POA: Diagnosis not present

## 2018-09-29 DIAGNOSIS — N186 End stage renal disease: Secondary | ICD-10-CM | POA: Diagnosis not present

## 2018-09-29 DIAGNOSIS — D509 Iron deficiency anemia, unspecified: Secondary | ICD-10-CM | POA: Diagnosis not present

## 2018-09-29 DIAGNOSIS — N2581 Secondary hyperparathyroidism of renal origin: Secondary | ICD-10-CM | POA: Diagnosis not present

## 2018-10-01 DIAGNOSIS — Z992 Dependence on renal dialysis: Secondary | ICD-10-CM | POA: Diagnosis not present

## 2018-10-01 DIAGNOSIS — N2581 Secondary hyperparathyroidism of renal origin: Secondary | ICD-10-CM | POA: Diagnosis not present

## 2018-10-01 DIAGNOSIS — N186 End stage renal disease: Secondary | ICD-10-CM | POA: Diagnosis not present

## 2018-10-01 DIAGNOSIS — D509 Iron deficiency anemia, unspecified: Secondary | ICD-10-CM | POA: Diagnosis not present

## 2018-10-01 DIAGNOSIS — D631 Anemia in chronic kidney disease: Secondary | ICD-10-CM | POA: Diagnosis not present

## 2018-10-06 DIAGNOSIS — D509 Iron deficiency anemia, unspecified: Secondary | ICD-10-CM | POA: Diagnosis not present

## 2018-10-06 DIAGNOSIS — Z992 Dependence on renal dialysis: Secondary | ICD-10-CM | POA: Diagnosis not present

## 2018-10-06 DIAGNOSIS — D631 Anemia in chronic kidney disease: Secondary | ICD-10-CM | POA: Diagnosis not present

## 2018-10-06 DIAGNOSIS — N2581 Secondary hyperparathyroidism of renal origin: Secondary | ICD-10-CM | POA: Diagnosis not present

## 2018-10-06 DIAGNOSIS — N186 End stage renal disease: Secondary | ICD-10-CM | POA: Diagnosis not present

## 2018-10-08 DIAGNOSIS — N186 End stage renal disease: Secondary | ICD-10-CM | POA: Diagnosis not present

## 2018-10-08 DIAGNOSIS — Z992 Dependence on renal dialysis: Secondary | ICD-10-CM | POA: Diagnosis not present

## 2018-10-08 DIAGNOSIS — D509 Iron deficiency anemia, unspecified: Secondary | ICD-10-CM | POA: Diagnosis not present

## 2018-10-08 DIAGNOSIS — N2581 Secondary hyperparathyroidism of renal origin: Secondary | ICD-10-CM | POA: Diagnosis not present

## 2018-10-08 DIAGNOSIS — D631 Anemia in chronic kidney disease: Secondary | ICD-10-CM | POA: Diagnosis not present

## 2018-10-09 ENCOUNTER — Emergency Department (HOSPITAL_COMMUNITY): Payer: Medicare Other

## 2018-10-09 ENCOUNTER — Emergency Department (HOSPITAL_COMMUNITY)
Admission: EM | Admit: 2018-10-09 | Discharge: 2018-10-09 | Disposition: A | Discharge status: Home / Self Care | Payer: Medicare Other | Attending: Emergency Medicine | Admitting: Emergency Medicine

## 2018-10-09 ENCOUNTER — Other Ambulatory Visit: Payer: Self-pay

## 2018-10-09 ENCOUNTER — Encounter (HOSPITAL_COMMUNITY): Payer: Self-pay | Admitting: Emergency Medicine

## 2018-10-09 DIAGNOSIS — N186 End stage renal disease: Secondary | ICD-10-CM | POA: Insufficient documentation

## 2018-10-09 DIAGNOSIS — R05 Cough: Secondary | ICD-10-CM

## 2018-10-09 DIAGNOSIS — Z992 Dependence on renal dialysis: Secondary | ICD-10-CM | POA: Diagnosis not present

## 2018-10-09 DIAGNOSIS — Z87891 Personal history of nicotine dependence: Secondary | ICD-10-CM | POA: Insufficient documentation

## 2018-10-09 DIAGNOSIS — I509 Heart failure, unspecified: Secondary | ICD-10-CM | POA: Insufficient documentation

## 2018-10-09 DIAGNOSIS — Z79899 Other long term (current) drug therapy: Secondary | ICD-10-CM | POA: Insufficient documentation

## 2018-10-09 DIAGNOSIS — R079 Chest pain, unspecified: Secondary | ICD-10-CM | POA: Diagnosis not present

## 2018-10-09 DIAGNOSIS — I132 Hypertensive heart and chronic kidney disease with heart failure and with stage 5 chronic kidney disease, or end stage renal disease: Secondary | ICD-10-CM | POA: Diagnosis not present

## 2018-10-09 DIAGNOSIS — R1110 Vomiting, unspecified: Secondary | ICD-10-CM | POA: Diagnosis not present

## 2018-10-09 DIAGNOSIS — R059 Cough, unspecified: Secondary | ICD-10-CM

## 2018-10-09 DIAGNOSIS — E1122 Type 2 diabetes mellitus with diabetic chronic kidney disease: Secondary | ICD-10-CM | POA: Insufficient documentation

## 2018-10-09 LAB — CBC WITH DIFFERENTIAL/PLATELET
Abs Immature Granulocytes: 0.01 10*3/uL (ref 0.00–0.07)
Basophils Absolute: 0 10*3/uL (ref 0.0–0.1)
Basophils Relative: 0 %
EOS ABS: 0.1 10*3/uL (ref 0.0–0.5)
Eosinophils Relative: 1 %
HCT: 42.1 % (ref 39.0–52.0)
Hemoglobin: 12.3 g/dL — ABNORMAL LOW (ref 13.0–17.0)
Immature Granulocytes: 0 %
LYMPHS ABS: 0.4 10*3/uL — AB (ref 0.7–4.0)
Lymphocytes Relative: 6 %
MCH: 24.3 pg — ABNORMAL LOW (ref 26.0–34.0)
MCHC: 29.2 g/dL — ABNORMAL LOW (ref 30.0–36.0)
MCV: 83 fL (ref 80.0–100.0)
MONOS PCT: 12 %
Monocytes Absolute: 0.7 10*3/uL (ref 0.1–1.0)
Neutro Abs: 5 10*3/uL (ref 1.7–7.7)
Neutrophils Relative %: 81 %
Platelets: 125 10*3/uL — ABNORMAL LOW (ref 150–400)
RBC: 5.07 MIL/uL (ref 4.22–5.81)
RDW: 14.9 % (ref 11.5–15.5)
WBC: 6.2 10*3/uL (ref 4.0–10.5)
nRBC: 0 % (ref 0.0–0.2)

## 2018-10-09 LAB — BASIC METABOLIC PANEL
Anion gap: 17 — ABNORMAL HIGH (ref 5–15)
BUN: 49 mg/dL — ABNORMAL HIGH (ref 6–20)
CALCIUM: 9.9 mg/dL (ref 8.9–10.3)
CO2: 28 mmol/L (ref 22–32)
Chloride: 92 mmol/L — ABNORMAL LOW (ref 98–111)
Creatinine, Ser: 13.35 mg/dL — ABNORMAL HIGH (ref 0.61–1.24)
GFR calc Af Amer: 4 mL/min — ABNORMAL LOW (ref >60)
GFR calc non Af Amer: 4 mL/min — ABNORMAL LOW (ref >60)
Glucose, Bld: 133 mg/dL — ABNORMAL HIGH (ref 70–99)
Potassium: 4.5 mmol/L (ref 3.5–5.1)
Sodium: 137 mmol/L (ref 135–145)

## 2018-10-09 MED ORDER — ACETAMINOPHEN 500 MG PO TABS
1000.0000 mg | ORAL_TABLET | Freq: Once | ORAL | Status: AC
  Administered 2018-10-09: 1000 mg via ORAL
  Filled 2018-10-09: qty 2

## 2018-10-09 MED ORDER — ONDANSETRON 4 MG PO TBDP
4.0000 mg | ORAL_TABLET | Freq: Once | ORAL | Status: AC
  Administered 2018-10-09: 4 mg via ORAL
  Filled 2018-10-09: qty 1

## 2018-10-09 MED ORDER — ONDANSETRON 4 MG PO TBDP: ORAL_TABLET | ORAL | 0 refills | Status: DC

## 2018-10-09 NOTE — Discharge Instructions (Signed)
Use Zofran as needed for nausea and vomiting. Follow-up closely with your primary doctor and your scheduled dialysis. Return for focal abdominal pain, recurrent vomiting or new symptoms

## 2018-10-09 NOTE — ED Provider Notes (Signed)
[facility] EMERGENCY DEPARTMENT Provider Note   CSN: [id number] Arrival date & time: 10/09/18  2725    History   Chief Complaint Chief Complaint  Patient presents with  . Emesis    HPI Alejandro Lee is a 58 y.o. male.     Patient presents with cough vomiting congestion for 1 day.  No significant sick contacts.  Patient has history of high blood pressure, congestive heart failure, diabetes, chronic kidney disease and reflux.  Patient denies shortness of breath no significant weight gain.  Subjective fever.     Past Medical History:  Diagnosis Date  . Arthritis   . Back pain   . CHF (congestive heart failure) ([place])   . Chronic kidney disease    dialysis M/W/F  . Diabetes mellitus without complication ([place])   . GERD (gastroesophageal reflux disease)   . Headache    migraines  . Hyperlipidemia   . Hypertension   . Myocardial infarction [facility])    patient states it was seen on EKG, denies a cardiac cath    Patient Active Problem List   Diagnosis Date Noted  . ESRD on hemodialysis ([place])   . Hyperkalemia 06/25/2018  . Non-compliance with renal dialysis ([place]) 06/25/2018  . Generalized abdominal pain 06/25/2018  . Chronic back pain 06/25/2018  . GERD (gastroesophageal reflux disease) 06/25/2018  . Chronic diastolic HF (heart failure) ([place]) 06/25/2018  . Class 1 obesity due to excess calories in adult 06/25/2018  . Benign essential HTN 06/25/2018  . 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[place]) 12/25/2017  . Type 2 diabetes mellitus with other specified complication ([place]) 36/64/4034  . ESRD (end stage renal disease) on dialysis ([place]) 12/19/2017  . Chronic right-sided low back pain without sciatica 12/19/2017  . Pseudophakia of left eye 09/03/2017  . PDR (proliferative diabetic retinopathy) ([place]) 09/03/2017  . Cataract in degenerative disorder 09/03/2017  . Unspecified complication of internal prosthetic device, implant and graft, initial encounter 05/17/2017  .  Rectal abscess 05/17/2017  . 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 Metabolic disorder 74/25/9563  . Hypertension 05/16/2017  . Dependence on renal dialysis ([place]) 05/16/2017  . Anemia of chronic renal failure, stage 5 ([facility]) 05/16/2017  . Retinal detachment, tractional, both eyes 06/19/2012    Past Surgical History:  Procedure Laterality Date  . A/V FISTULAGRAM Right 07/22/2018   Procedure: A/V FISTULAGRAM;  Surgeon: Serafina Mitchell, MD;  Location: [facility];  Service: Cardiovascular;  Laterality: Right;  . AV FISTULA PLACEMENT Right   . AV FISTULA PLACEMENT Right 87/56/4332   Procedure: PLICATION OF RIGHT arm FISTULA;  Surgeon: Waynetta Sandy, MD;  Location: [facility];  Service: Vascular;  Laterality: Right;  . diabetic cyst removal Bilateral    on buttocks x8  . EYE SURGERY    . FISTULA SUPERFICIALIZATION Right 9/51/8841   Procedure: PLICATION OF ARTERIOVENOUS FISTULA RIGHT ARM;  Surgeon: Conrad Spring Mount, MD;  Location: [facility];  Service: Vascular;  Laterality: Right;  . lens replacement Left   . PERIPHERAL VASCULAR BALLOON ANGIOPLASTY  07/22/2018   Procedure: PERIPHERAL VASCULAR BALLOON ANGIOPLASTY;  Surgeon: Serafina Mitchell, MD;  Location: [facility];  Service: Cardiovascular;;  Right [place] fistula   . REFRACTIVE SURGERY Bilateral         Home Medications    Prior to Admission medications   Medication Sig Start Date End Date Taking? Authorizing Provider  acetaminophen (TYLENOL) 325 MG tablet Take 650 mg by mouth daily as  needed for headache.    [provider]  amLODipine (NORVASC) 5 MG tablet Take 1 tablet (5 mg total) by mouth daily. 06/26/18   Barton Dubois, MD  calcium acetate (PHOSLO) 667 MG capsule Take 2,001 mg by mouth 3 (three) times daily with meals.     [provider]  lidocaine-prilocaine (EMLA) cream Apply 1 application topically as needed (port acces).    [provider]    ondansetron (ZOFRAN ODT) 4 MG disintegrating tablet 4mg  ODT q4 hours prn nausea/vomit 10/09/18   Elnora Morrison, MD  oxyCODONE-acetaminophen (PERCOCET/ROXICET) 5-325 MG tablet Take 1 tablet by mouth every 6 (six) hours as needed. 07/22/18   Ulyses Amor, PA-C  pantoprazole (PROTONIX) 40 MG tablet Take 1 tablet (40 mg total) by mouth daily. 06/27/18   Barton Dubois, MD  sorbitol 70 % solution Take 15 mLs by mouth daily as needed (severe constipation.). 06/26/18   Barton Dubois, MD    Family History Family History  Problem Relation Age of Onset  . Diabetes Mother   . Hypertension Mother   . Diabetes Father   . Heart disease Father   . Cancer Father   . Hypertension Father   . Diabetes Sister   . Hypertension Sister   . Diabetes Brother   . Hypertension Brother   . Cancer Brother     Social History Social History   Tobacco Use  . Smoking status: Former Smoker    Types: Cigarettes  . Smokeless tobacco: Never Used  Substance Use Topics  . Alcohol use: Not Currently  . Drug use: Never     Allergies   Patient has no known allergies.   Review of Systems Review of Systems  Constitutional: Positive for fever. Negative for chills.  HENT: Positive for congestion.   Eyes: Negative for visual disturbance.  Respiratory: Positive for cough. Negative for shortness of breath.   Cardiovascular: Negative for chest pain.  Gastrointestinal: Negative for abdominal pain and vomiting.  Genitourinary: Negative for dysuria and flank pain.  Musculoskeletal: Negative for back pain, neck pain and neck stiffness.  Skin: Negative for rash.  Neurological: Negative for light-headedness and headaches.     Physical Exam Updated Vital Signs BP (!) 158/71   Pulse 91   Temp 99.8 F (37.7 C) (Oral)   Resp 18   Ht 5\' 7"  (1.702 m)   Wt 93 kg   SpO2 92%   BMI 32.11 kg/m   Physical Exam Vitals signs and nursing note reviewed.  Constitutional:      Appearance: He is well-developed.   HENT:     Head: Normocephalic and atraumatic.     Nose: Congestion present.  Eyes:     General:        Right eye: No discharge.        Left eye: No discharge.     Conjunctiva/sclera: Conjunctivae normal.  Neck:     Musculoskeletal: Normal range of motion and neck supple.     Trachea: No tracheal deviation.  Cardiovascular:     Rate and Rhythm: Normal rate and regular rhythm.  Pulmonary:     Effort: Pulmonary effort is normal.     Breath sounds: Normal breath sounds.  Abdominal:     General: There is no distension.     Palpations: Abdomen is soft.     Tenderness: There is no abdominal tenderness. There is no guarding.  Musculoskeletal:     Right lower leg: No edema.     Left lower leg:  No edema.  Skin:    General: Skin is warm.     Findings: No rash.  Neurological:     Mental Status: He is alert and oriented to person, place, and time.      ED Treatments / Results  Labs (all labs ordered are listed, but only abnormal results are displayed) Labs Reviewed  CBC WITH DIFFERENTIAL/PLATELET - Abnormal; Notable for the following components:      Result Value   Hemoglobin 12.3 (*)    MCH 24.3 (*)    MCHC 29.2 (*)    Platelets 125 (*)    Lymphs Abs 0.4 (*)    All other components within normal limits  BASIC METABOLIC PANEL - Abnormal; Notable for the following components:   Chloride 92 (*)    Glucose, Bld 133 (*)    BUN 49 (*)    Creatinine, Ser 13.35 (*)    GFR calc non Af Amer 4 (*)    GFR calc Af Amer 4 (*)    Anion gap 17 (*)    All other components within normal limits    EKG None  Radiology Dg Chest 2 View  Result Date: 10/09/2018 CLINICAL DATA:  Chest pain with coughing and deep inspiration for 3 days. EXAM: CHEST - 2 VIEW COMPARISON:  PA and lateral chest 06/25/2018.  CT chest 11/17/2017. FINDINGS: Small focus of scar in the left lung base is unchanged. The lungs are otherwise clear. Mild cardiomegaly. No pneumothorax or pleural effusion. No acute or focal  bony abnormality. IMPRESSION: No acute disease. Mild cardiomegaly. Electronically Signed   By: Inge Rise M.D.   On: 10/09/2018 11:46    Procedures Procedures (including critical care time)  Medications Ordered in ED Medications  ondansetron (ZOFRAN-ODT) disintegrating tablet 4 mg (4 mg Oral Given 10/09/18 1035)  acetaminophen (TYLENOL) tablet 1,000 mg (1,000 mg Oral Given 10/09/18 1132)     Initial Impression / Assessment and Plan / ED Course  I have reviewed the triage vital signs and the nursing notes.  Pertinent labs & imaging results that were available during my care of the patient were reviewed by me and considered in my medical decision making (see chart for details).       Patient presents with clinically viral syndrome.  Patient does have significant medical history and dialysis history thus blood work checked.  Potassium normal, kidney function elevated expected, mild low hemoglobin.  Chest x-ray reviewed no acute findings.  Patient improved with Tylenol and Zofran.  Discussed close outpatient follow-up.  Results and differential diagnosis were discussed with the patient/parent/guardian. Xrays were independently reviewed by myself.  Close follow up outpatient was discussed, comfortable with the plan.   Medications  ondansetron (ZOFRAN-ODT) disintegrating tablet 4 mg (4 mg Oral Given 10/09/18 1035)  acetaminophen (TYLENOL) tablet 1,000 mg (1,000 mg Oral Given 10/09/18 1132)    Vitals:   10/09/18 0939 10/09/18 0940 10/09/18 1100 10/09/18 1200  BP: (!) 183/87  (!) 158/71 (!) 149/68  Pulse: 99  91 91  Resp: 18  18 18   Temp: 99.8 F (37.7 C)     TempSrc: Oral     SpO2: 100%  92% 91%  Weight:  93 kg    Height:  5\' 7"  (1.702 m)      Final diagnoses:  Vomiting in adult  Cough in adult     Final Clinical Impressions(s) / ED Diagnoses   Final diagnoses:  Vomiting in adult  Cough in adult    ED Discharge  Orders         Ordered    ondansetron (ZOFRAN ODT)  4 MG disintegrating tablet     10/09/18 1229           Elnora Morrison, MD 10/11/18 2244

## 2018-10-09 NOTE — ED Notes (Signed)
Pt in xray

## 2018-10-09 NOTE — ED Triage Notes (Signed)
Vomited x 1 today.  Also c/o cough for past couple of days.

## 2018-10-10 DIAGNOSIS — D509 Iron deficiency anemia, unspecified: Secondary | ICD-10-CM | POA: Diagnosis not present

## 2018-10-10 DIAGNOSIS — N2581 Secondary hyperparathyroidism of renal origin: Secondary | ICD-10-CM | POA: Diagnosis not present

## 2018-10-10 DIAGNOSIS — D631 Anemia in chronic kidney disease: Secondary | ICD-10-CM | POA: Diagnosis not present

## 2018-10-10 DIAGNOSIS — N186 End stage renal disease: Secondary | ICD-10-CM | POA: Diagnosis not present

## 2018-10-10 DIAGNOSIS — Z992 Dependence on renal dialysis: Secondary | ICD-10-CM | POA: Diagnosis not present

## 2018-10-11 DIAGNOSIS — Z992 Dependence on renal dialysis: Secondary | ICD-10-CM | POA: Diagnosis not present

## 2018-10-11 DIAGNOSIS — N186 End stage renal disease: Secondary | ICD-10-CM | POA: Diagnosis not present

## 2018-10-12 ENCOUNTER — Emergency Department (HOSPITAL_COMMUNITY): Payer: Medicare Other

## 2018-10-12 ENCOUNTER — Encounter (HOSPITAL_COMMUNITY): Payer: Self-pay | Admitting: Emergency Medicine

## 2018-10-12 ENCOUNTER — Emergency Department (HOSPITAL_COMMUNITY)
Admission: EM | Admit: 2018-10-12 | Discharge: 2018-10-12 | Disposition: A | Discharge status: Home / Self Care | Payer: Medicare Other | Attending: Emergency Medicine | Admitting: Emergency Medicine

## 2018-10-12 ENCOUNTER — Other Ambulatory Visit: Payer: Self-pay

## 2018-10-12 DIAGNOSIS — E785 Hyperlipidemia, unspecified: Secondary | ICD-10-CM | POA: Diagnosis not present

## 2018-10-12 DIAGNOSIS — I110 Hypertensive heart disease with heart failure: Secondary | ICD-10-CM | POA: Diagnosis not present

## 2018-10-12 DIAGNOSIS — J069 Acute upper respiratory infection, unspecified: Secondary | ICD-10-CM | POA: Insufficient documentation

## 2018-10-12 DIAGNOSIS — Z79899 Other long term (current) drug therapy: Secondary | ICD-10-CM | POA: Diagnosis not present

## 2018-10-12 DIAGNOSIS — Z87891 Personal history of nicotine dependence: Secondary | ICD-10-CM | POA: Diagnosis not present

## 2018-10-12 DIAGNOSIS — I509 Heart failure, unspecified: Secondary | ICD-10-CM | POA: Diagnosis not present

## 2018-10-12 DIAGNOSIS — M25552 Pain in left hip: Secondary | ICD-10-CM | POA: Insufficient documentation

## 2018-10-12 DIAGNOSIS — M25551 Pain in right hip: Secondary | ICD-10-CM | POA: Insufficient documentation

## 2018-10-12 DIAGNOSIS — R0602 Shortness of breath: Secondary | ICD-10-CM | POA: Diagnosis not present

## 2018-10-12 LAB — CBC WITH DIFFERENTIAL/PLATELET
Abs Immature Granulocytes: 0.01 10*3/uL (ref 0.00–0.07)
BASOS PCT: 1 %
Basophils Absolute: 0 10*3/uL (ref 0.0–0.1)
Eosinophils Absolute: 0 10*3/uL (ref 0.0–0.5)
Eosinophils Relative: 1 %
HCT: 43.5 % (ref 39.0–52.0)
Hemoglobin: 13.2 g/dL (ref 13.0–17.0)
Immature Granulocytes: 0 %
Lymphocytes Relative: 23 %
Lymphs Abs: 1 10*3/uL (ref 0.7–4.0)
MCH: 24.5 pg — ABNORMAL LOW (ref 26.0–34.0)
MCHC: 30.3 g/dL (ref 30.0–36.0)
MCV: 80.9 fL (ref 80.0–100.0)
Monocytes Absolute: 0.6 10*3/uL (ref 0.1–1.0)
Monocytes Relative: 13 %
Neutro Abs: 2.8 10*3/uL (ref 1.7–7.7)
Neutrophils Relative %: 62 %
PLATELETS: 123 10*3/uL — AB (ref 150–400)
RBC: 5.38 MIL/uL (ref 4.22–5.81)
RDW: 14.6 % (ref 11.5–15.5)
WBC: 4.4 10*3/uL (ref 4.0–10.5)
nRBC: 0 % (ref 0.0–0.2)

## 2018-10-12 LAB — TROPONIN I
Troponin I: 0.13 ng/mL (ref <0.03)
Troponin I: 0.1 ng/mL (ref <0.03)

## 2018-10-12 LAB — COMPREHENSIVE METABOLIC PANEL
ALT: 31 U/L (ref 0–44)
AST: 46 U/L — ABNORMAL HIGH (ref 15–41)
Albumin: 4.1 g/dL (ref 3.5–5.0)
Alkaline Phosphatase: 73 U/L (ref 38–126)
Anion gap: 25 — ABNORMAL HIGH (ref 5–15)
BUN: 75 mg/dL — ABNORMAL HIGH (ref 6–20)
CO2: 25 mmol/L (ref 22–32)
Calcium: 9.6 mg/dL (ref 8.9–10.3)
Chloride: 86 mmol/L — ABNORMAL LOW (ref 98–111)
Creatinine, Ser: 16.51 mg/dL — ABNORMAL HIGH (ref 0.61–1.24)
GFR calc Af Amer: 3 mL/min — ABNORMAL LOW (ref >60)
GFR, EST NON AFRICAN AMERICAN: 3 mL/min — AB (ref >60)
Glucose, Bld: 151 mg/dL — ABNORMAL HIGH (ref 70–99)
Potassium: 4.3 mmol/L (ref 3.5–5.1)
Sodium: 136 mmol/L (ref 135–145)
Total Bilirubin: 1 mg/dL (ref 0.3–1.2)
Total Protein: 8.5 g/dL — ABNORMAL HIGH (ref 6.5–8.1)

## 2018-10-12 MED ORDER — IPRATROPIUM-ALBUTEROL 0.5-2.5 (3) MG/3ML IN SOLN
3.0000 mL | Freq: Once | RESPIRATORY_TRACT | Status: AC
  Administered 2018-10-12: 3 mL via RESPIRATORY_TRACT
  Filled 2018-10-12: qty 3

## 2018-10-12 MED ORDER — GUAIFENESIN-DM 100-10 MG/5ML PO SYRP: 5.0000 mL | ORAL_SOLUTION | ORAL | 0 refills | Status: DC | PRN

## 2018-10-12 MED ORDER — HYDROCODONE-ACETAMINOPHEN 5-325 MG PO TABS
1.0000 | ORAL_TABLET | Freq: Once | ORAL | Status: AC
  Administered 2018-10-12: 1 via ORAL
  Filled 2018-10-12: qty 1

## 2018-10-12 MED ORDER — HYDROCODONE-ACETAMINOPHEN 5-325 MG PO TABS: 2.0000 | ORAL_TABLET | ORAL | 0 refills | Status: DC | PRN

## 2018-10-12 MED ORDER — ALBUTEROL SULFATE HFA 108 (90 BASE) MCG/ACT IN AERS
2.0000 | INHALATION_SPRAY | RESPIRATORY_TRACT | Status: DC | PRN
  Administered 2018-10-12: 2 via RESPIRATORY_TRACT
  Filled 2018-10-12: qty 6.7

## 2018-10-12 NOTE — ED Notes (Signed)
Date and time results received: 10/12/18 0857 (use smartphrase ".now" to insert current time)  Test: troponin Critical Value: 0.13  Name of Provider Notified: Alvino Chapel MD  Orders Received? Or Actions Taken?: n/a

## 2018-10-12 NOTE — ED Notes (Signed)
Radiology tech Maudie Mercury) in to transport pt for xray.  Pt will not state his name, only moving mouth.  Pt blinking eyes and moving back and forth.  Wife reports, pt got like this am home and says he is having trouble getting words out and having trouble getting air in and out of his lungs.  Pts Sats 96% on RA and 100% on O2@2L /M via n/c.  Pt says SOB comes and goes.

## 2018-10-12 NOTE — ED Notes (Signed)
Pt ambulated to the bathroom without assistance. 

## 2018-10-12 NOTE — ED Provider Notes (Addendum)
[facility] EMERGENCY DEPARTMENT Provider Note   CSN: [id number] Arrival date & time: 10/12/18  6979    History   Chief Complaint Chief Complaint  Patient presents with  . Shortness of Breath  . Hip Pain    HPI Alejandro Lee is a 58 y.o. male.     HPI Patient presents with shortness of breath and bilateral hip pain.  Has been feeling bad for around 4 days.  Previously seen in the ER with nausea and vomiting.  Thought to be viral at that time.  Now states having more trouble talking.  States has had a cough with minimal sputum production.  Worse with laying flat.  No fevers.  Also has dull chest pain.  He is a dialysis patient on Monday Wednesday Friday and was dialyzed on Friday with a full dialysis.  States he does not weigh himself at home.  States he has pain in both his hips.  No trauma.  Also pain up in his neck.  Has a history of some chronic pain but that is mostly in his back. Past Medical History:  Diagnosis Date  . Arthritis   . Back pain   . CHF (congestive heart failure) ([place])   . Chronic kidney disease    dialysis M/W/F  . Diabetes mellitus without complication ([place])   . GERD (gastroesophageal reflux disease)   . Headache    migraines  . Hyperlipidemia   . Hypertension   . Myocardial infarction [facility])    patient states it was seen on EKG, denies a cardiac cath    Patient Active Problem List   Diagnosis Date Noted  . ESRD on hemodialysis ([place])   . Hyperkalemia 06/25/2018  . Non-compliance with renal dialysis ([place]) 06/25/2018  . Generalized abdominal pain 06/25/2018  . Chronic back pain 06/25/2018  . GERD (gastroesophageal reflux disease) 06/25/2018  . Chronic diastolic HF (heart failure) ([place]) 06/25/2018  . Class 1 obesity due to excess calories in adult 06/25/2018  . Benign essential HTN 06/25/2018  . 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[place]) 12/25/2017  . Type 2 diabetes mellitus with other specified complication ([place]) 48/08/6551  . ESRD (end stage  renal disease) on dialysis ([place]) 12/19/2017  . Chronic right-sided low back pain without sciatica 12/19/2017  . Pseudophakia of left eye 09/03/2017  . PDR (proliferative diabetic retinopathy) ([place]) 09/03/2017  . Cataract in degenerative disorder 09/03/2017  . Unspecified complication of internal prosthetic device, implant and graft, initial encounter 05/17/2017  . Rectal abscess 05/17/2017  . 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 Metabolic disorder 74/82/7078  . Hypertension 05/16/2017  . Dependence on renal dialysis ([place]) 05/16/2017  . Anemia of chronic renal failure, stage 5 (HCC) 05/16/2017  . Retinal detachment, tractional, both eyes 06/19/2012    Past Surgical History:  Procedure Laterality Date  . A/V FISTULAGRAM Right 07/22/2018   Procedure: A/V FISTULAGRAM;  Surgeon: Serafina [place], MD;  Location: [place] [facility];  Service: Cardiovascular;  Laterality: Right;  . AV FISTULA PLACEMENT Right   . AV FISTULA PLACEMENT Right 67/54/4920   Procedure: PLICATION OF RIGHT arm FISTULA;  Surgeon: Waynetta Sandy, MD;  Location: [place];  Service: Vascular;  Laterality: Right;  . diabetic cyst removal Bilateral    on buttocks x8  . EYE SURGERY    . FISTULA SUPERFICIALIZATION Right 1/00/7121   Procedure: PLICATION OF ARTERIOVENOUS FISTULA RIGHT ARM;  Surgeon: Conrad [place], MD;  Location: [place];  Service: Vascular;  Laterality: Right;  . lens replacement Left   . PERIPHERAL VASCULAR BALLOON ANGIOPLASTY  07/22/2018   Procedure: PERIPHERAL VASCULAR BALLOON ANGIOPLASTY;  Surgeon: Serafina [place], MD;  Location: [place] [facility];  Service: Cardiovascular;;  Right Farm fistula   . REFRACTIVE SURGERY Bilateral         Home Medications    Prior to Admission medications   Medication Sig Start Date End Date Taking? Authorizing Provider  acetaminophen (TYLENOL) 325 MG tablet Take 650 mg by mouth daily as needed for headache.    [provider]  amLODipine (NORVASC) 5 MG tablet Take 1 tablet (5 mg total) by mouth daily. 06/26/18   Barton Dubois, MD  calcium acetate (PHOSLO) 667 MG capsule Take 2,001 mg by mouth 3 (three) times daily with meals.     [provider]  guaiFENesin-dextromethorphan (ROBITUSSIN DM) 100-10 MG/5ML syrup Take 5 mLs by mouth every 4 (four) hours as needed for cough. 10/12/18   Davonna Belling, MD  HYDROcodone-acetaminophen (NORCO/VICODIN) 5-325 MG tablet Take 2 tablets by mouth every 4 (four) hours as needed. 10/12/18   Davonna Belling, MD  lidocaine-prilocaine (EMLA) cream Apply 1 application topically as needed (port acces).    [provider]  ondansetron (ZOFRAN ODT) 4 MG disintegrating tablet 4mg  ODT q4 hours prn nausea/vomit 10/09/18   Elnora Morrison, MD  oxyCODONE-acetaminophen (PERCOCET/ROXICET) 5-325 MG tablet Take 1 tablet by mouth every 6 (six) hours as needed. 07/22/18   Ulyses Amor, PA-C  pantoprazole (PROTONIX) 40 MG tablet Take 1 tablet (40 mg total) by mouth daily. 06/27/18   Barton Dubois, MD  sorbitol 70 % solution Take 15 mLs by mouth daily as needed (severe constipation.). 06/26/18   Barton Dubois, MD    Family History Family History  Problem Relation Age of Onset  . Diabetes Mother   . Hypertension Mother   . Diabetes Father   . Heart disease Father   . Cancer Father   . Hypertension Father   . Diabetes Sister   . Hypertension Sister   . Diabetes Brother   . Hypertension Brother   . Cancer Brother     Social History Social History   Tobacco Use  . Smoking status: Former Smoker    Types: Cigarettes  . Smokeless tobacco: Never Used  Substance Use Topics  . Alcohol use: Not Currently  . Drug use: Never     Allergies   Patient has no known allergies.   Review of Systems Review of Systems   Physical Exam Updated Vital Signs BP (!) 135/59   Pulse 80   Temp 99 F (37.2 C) (Oral)   Resp (!) 24   Ht 5\' 7"  (1.702 m)   Wt 93 kg    SpO2 94%   BMI 32.11 kg/m   Physical Exam Vitals signs and nursing note reviewed.  HENT:     Head: Atraumatic.  Neck:     Musculoskeletal: Neck supple.  Cardiovascular:     Rate and Rhythm: Normal rate.  Pulmonary:     Breath sounds: Wheezing present.     Comments: Initial mild tachypnea Chest:     Chest wall: No tenderness.  Abdominal:     Tenderness: There is no abdominal tenderness.  Musculoskeletal:     Right lower leg: Edema present.     Left lower leg: Edema present.     Comments: Mild bilateral lateral hip pain.  Skin:    General: Skin is warm.     Capillary  Refill: Capillary refill takes less than 2 seconds.  Neurological:     General: No focal deficit present.     Mental Status: He is alert.      ED Treatments / Results  Labs (all labs ordered are listed, but only abnormal results are displayed) Labs Reviewed  COMPREHENSIVE METABOLIC PANEL - Abnormal; Notable for the following components:      Result Value   Chloride 86 (*)    Glucose, Bld 151 (*)    BUN 75 (*)    Creatinine, Ser 16.51 (*)    Total Protein 8.5 (*)    AST 46 (*)    GFR calc non Af Amer 3 (*)    GFR calc Af Amer 3 (*)    Anion gap 25 (*)    All other components within normal limits  CBC WITH DIFFERENTIAL/PLATELET - Abnormal; Notable for the following components:   MCH 24.5 (*)    Platelets 123 (*)    All other components within normal limits  TROPONIN I - Abnormal; Notable for the following components:   Troponin I 0.13 (*)    All other components within normal limits  TROPONIN I - Abnormal; Notable for the following components:   Troponin I 0.10 (*)    All other components within normal limits    EKG EKG Interpretation  Date/Time:  Sunday October 12 2018 07:27:35 EST Ventricular Rate:  88 PR Interval:    QRS Duration: 90 QT Interval:  410 QTC Calculation: 497 R Axis:   -15 Text Interpretation:  Sinus rhythm Borderline left axis deviation Borderline low voltage, extremity  leads Nonspecific T abnrm, anterolateral leads Borderline prolonged QT interval Confirmed by Davonna Belling [phone]) on 10/12/2018 8:01:27 AM   Radiology Dg Chest 2 View  Result Date: 10/12/2018 CLINICAL DATA:  Shortness of breath EXAM: CHEST - 2 VIEW COMPARISON:  10/09/2018 FINDINGS: Cardiac shadow is stable. The lungs are well aerated bilaterally. Stable scarring in the left base is noted. No bony abnormality is seen. IMPRESSION: No acute abnormality seen. Electronically Signed   By: Inez Catalina M.D.   On: 10/12/2018 08:54   Dg Pelvis 1-2 Views  Result Date: 10/12/2018 CLINICAL DATA:  Bilateral hip pain EXAM: PELVIS - 1 VIEW COMPARISON:  None. FINDINGS: Pelvic ring is intact. No acute fracture or dislocation is noted. No soft tissue abnormality is seen. Diffuse vascular calcifications are seen IMPRESSION: No acute abnormality noted. Electronically Signed   By: Inez Catalina M.D.   On: 10/12/2018 08:55    Procedures Procedures (including critical care time)  Medications Ordered in ED Medications  albuterol (PROVENTIL HFA;VENTOLIN HFA) 108 (90 Base) MCG/ACT inhaler 2 puff (2 puffs Inhalation Given 10/12/18 1055)  ipratropium-albuterol (DUONEB) 0.5-2.5 (3) MG/3ML nebulizer solution 3 mL (3 mLs Nebulization Given 10/12/18 0847)  HYDROcodone-acetaminophen (NORCO/VICODIN) 5-325 MG per tablet 1 tablet (1 tablet Oral Given 10/12/18 0941)     Initial Impression / Assessment and Plan / ED Course  I have reviewed the triage vital signs and the nursing notes.  Pertinent labs & imaging results that were available during my care of the patient were reviewed by me and considered in my medical decision making (see chart for details).        Patient with shortness of breath and cough.  Dull mid chest pain.  Has had URI symptoms.  X-ray reassuring.  Troponin mildly elevated but appears stable and I think is likely secondary to dialysis not large vessel ischemic disease.  Feels better after  breathing  treatment.  Discharge home with albuterol symptomatic treatment of the cough and small dose of pain medicine for the hip pain.  Follow-up as an outpatient and has dialysis scheduled tomorrow  Final Clinical Impressions(s) / ED Diagnoses   Final diagnoses:  Upper respiratory tract infection, unspecified type  Pain of both hip joints    ED Discharge Orders         Ordered    guaiFENesin-dextromethorphan (ROBITUSSIN DM) 100-10 MG/5ML syrup  Every 4 hours PRN     10/12/18 1227    HYDROcodone-acetaminophen (NORCO/VICODIN) 5-325 MG tablet  Every 4 hours PRN     10/12/18 1227           Davonna Belling, MD 10/12/18 1229    Davonna Belling, MD 11/06/18 [phone]

## 2018-10-12 NOTE — ED Notes (Signed)
Family at bedside. 

## 2018-10-12 NOTE — ED Triage Notes (Signed)
Pt c/o of bilateral hip pain and sob x 4 days.

## 2018-10-12 NOTE — ED Notes (Signed)
ED Provider at bedside. 

## 2018-10-13 DIAGNOSIS — D509 Iron deficiency anemia, unspecified: Secondary | ICD-10-CM | POA: Diagnosis not present

## 2018-10-13 DIAGNOSIS — N186 End stage renal disease: Secondary | ICD-10-CM | POA: Diagnosis not present

## 2018-10-13 DIAGNOSIS — N2581 Secondary hyperparathyroidism of renal origin: Secondary | ICD-10-CM | POA: Diagnosis not present

## 2018-10-13 DIAGNOSIS — Z992 Dependence on renal dialysis: Secondary | ICD-10-CM | POA: Diagnosis not present

## 2018-10-13 DIAGNOSIS — D631 Anemia in chronic kidney disease: Secondary | ICD-10-CM | POA: Diagnosis not present

## 2018-10-15 DIAGNOSIS — D631 Anemia in chronic kidney disease: Secondary | ICD-10-CM | POA: Diagnosis not present

## 2018-10-15 DIAGNOSIS — Z992 Dependence on renal dialysis: Secondary | ICD-10-CM | POA: Diagnosis not present

## 2018-10-15 DIAGNOSIS — N186 End stage renal disease: Secondary | ICD-10-CM | POA: Diagnosis not present

## 2018-10-15 DIAGNOSIS — D509 Iron deficiency anemia, unspecified: Secondary | ICD-10-CM | POA: Diagnosis not present

## 2018-10-15 DIAGNOSIS — N2581 Secondary hyperparathyroidism of renal origin: Secondary | ICD-10-CM | POA: Diagnosis not present

## 2018-10-17 DIAGNOSIS — D509 Iron deficiency anemia, unspecified: Secondary | ICD-10-CM | POA: Diagnosis not present

## 2018-10-17 DIAGNOSIS — D631 Anemia in chronic kidney disease: Secondary | ICD-10-CM | POA: Diagnosis not present

## 2018-10-17 DIAGNOSIS — N2581 Secondary hyperparathyroidism of renal origin: Secondary | ICD-10-CM | POA: Diagnosis not present

## 2018-10-17 DIAGNOSIS — Z992 Dependence on renal dialysis: Secondary | ICD-10-CM | POA: Diagnosis not present

## 2018-10-17 DIAGNOSIS — N186 End stage renal disease: Secondary | ICD-10-CM | POA: Diagnosis not present

## 2018-10-20 DIAGNOSIS — N186 End stage renal disease: Secondary | ICD-10-CM | POA: Diagnosis not present

## 2018-10-20 DIAGNOSIS — N2581 Secondary hyperparathyroidism of renal origin: Secondary | ICD-10-CM | POA: Diagnosis not present

## 2018-10-20 DIAGNOSIS — D631 Anemia in chronic kidney disease: Secondary | ICD-10-CM | POA: Diagnosis not present

## 2018-10-20 DIAGNOSIS — D509 Iron deficiency anemia, unspecified: Secondary | ICD-10-CM | POA: Diagnosis not present

## 2018-10-20 DIAGNOSIS — Z992 Dependence on renal dialysis: Secondary | ICD-10-CM | POA: Diagnosis not present

## 2018-10-22 DIAGNOSIS — D509 Iron deficiency anemia, unspecified: Secondary | ICD-10-CM | POA: Diagnosis not present

## 2018-10-22 DIAGNOSIS — N2581 Secondary hyperparathyroidism of renal origin: Secondary | ICD-10-CM | POA: Diagnosis not present

## 2018-10-22 DIAGNOSIS — N186 End stage renal disease: Secondary | ICD-10-CM | POA: Diagnosis not present

## 2018-10-22 DIAGNOSIS — Z992 Dependence on renal dialysis: Secondary | ICD-10-CM | POA: Diagnosis not present

## 2018-10-22 DIAGNOSIS — D631 Anemia in chronic kidney disease: Secondary | ICD-10-CM | POA: Diagnosis not present

## 2018-10-24 DIAGNOSIS — N186 End stage renal disease: Secondary | ICD-10-CM | POA: Diagnosis not present

## 2018-10-24 DIAGNOSIS — Z992 Dependence on renal dialysis: Secondary | ICD-10-CM | POA: Diagnosis not present

## 2018-10-24 DIAGNOSIS — N2581 Secondary hyperparathyroidism of renal origin: Secondary | ICD-10-CM | POA: Diagnosis not present

## 2018-10-24 DIAGNOSIS — D631 Anemia in chronic kidney disease: Secondary | ICD-10-CM | POA: Diagnosis not present

## 2018-10-24 DIAGNOSIS — D509 Iron deficiency anemia, unspecified: Secondary | ICD-10-CM | POA: Diagnosis not present

## 2018-10-27 DIAGNOSIS — D631 Anemia in chronic kidney disease: Secondary | ICD-10-CM | POA: Diagnosis not present

## 2018-10-27 DIAGNOSIS — D509 Iron deficiency anemia, unspecified: Secondary | ICD-10-CM | POA: Diagnosis not present

## 2018-10-27 DIAGNOSIS — N2581 Secondary hyperparathyroidism of renal origin: Secondary | ICD-10-CM | POA: Diagnosis not present

## 2018-10-27 DIAGNOSIS — N186 End stage renal disease: Secondary | ICD-10-CM | POA: Diagnosis not present

## 2018-10-27 DIAGNOSIS — Z992 Dependence on renal dialysis: Secondary | ICD-10-CM | POA: Diagnosis not present

## 2018-10-29 DIAGNOSIS — D631 Anemia in chronic kidney disease: Secondary | ICD-10-CM | POA: Diagnosis not present

## 2018-10-29 DIAGNOSIS — N186 End stage renal disease: Secondary | ICD-10-CM | POA: Diagnosis not present

## 2018-10-29 DIAGNOSIS — Z992 Dependence on renal dialysis: Secondary | ICD-10-CM | POA: Diagnosis not present

## 2018-10-29 DIAGNOSIS — D509 Iron deficiency anemia, unspecified: Secondary | ICD-10-CM | POA: Diagnosis not present

## 2018-10-29 DIAGNOSIS — N2581 Secondary hyperparathyroidism of renal origin: Secondary | ICD-10-CM | POA: Diagnosis not present

## 2018-10-31 DIAGNOSIS — D509 Iron deficiency anemia, unspecified: Secondary | ICD-10-CM | POA: Diagnosis not present

## 2018-10-31 DIAGNOSIS — D631 Anemia in chronic kidney disease: Secondary | ICD-10-CM | POA: Diagnosis not present

## 2018-10-31 DIAGNOSIS — N186 End stage renal disease: Secondary | ICD-10-CM | POA: Diagnosis not present

## 2018-10-31 DIAGNOSIS — Z992 Dependence on renal dialysis: Secondary | ICD-10-CM | POA: Diagnosis not present

## 2018-10-31 DIAGNOSIS — N2581 Secondary hyperparathyroidism of renal origin: Secondary | ICD-10-CM | POA: Diagnosis not present

## 2018-11-03 DIAGNOSIS — N186 End stage renal disease: Secondary | ICD-10-CM | POA: Diagnosis not present

## 2018-11-03 DIAGNOSIS — N2581 Secondary hyperparathyroidism of renal origin: Secondary | ICD-10-CM | POA: Diagnosis not present

## 2018-11-03 DIAGNOSIS — D509 Iron deficiency anemia, unspecified: Secondary | ICD-10-CM | POA: Diagnosis not present

## 2018-11-03 DIAGNOSIS — Z992 Dependence on renal dialysis: Secondary | ICD-10-CM | POA: Diagnosis not present

## 2018-11-03 DIAGNOSIS — D631 Anemia in chronic kidney disease: Secondary | ICD-10-CM | POA: Diagnosis not present

## 2018-11-05 DIAGNOSIS — N2581 Secondary hyperparathyroidism of renal origin: Secondary | ICD-10-CM | POA: Diagnosis not present

## 2018-11-05 DIAGNOSIS — N186 End stage renal disease: Secondary | ICD-10-CM | POA: Diagnosis not present

## 2018-11-05 DIAGNOSIS — D631 Anemia in chronic kidney disease: Secondary | ICD-10-CM | POA: Diagnosis not present

## 2018-11-05 DIAGNOSIS — Z992 Dependence on renal dialysis: Secondary | ICD-10-CM | POA: Diagnosis not present

## 2018-11-05 DIAGNOSIS — D509 Iron deficiency anemia, unspecified: Secondary | ICD-10-CM | POA: Diagnosis not present

## 2018-11-06 DIAGNOSIS — Z87891 Personal history of nicotine dependence: Secondary | ICD-10-CM | POA: Diagnosis not present

## 2018-11-06 DIAGNOSIS — E1065 Type 1 diabetes mellitus with hyperglycemia: Secondary | ICD-10-CM | POA: Diagnosis not present

## 2018-11-06 DIAGNOSIS — Z299 Encounter for prophylactic measures, unspecified: Secondary | ICD-10-CM | POA: Diagnosis not present

## 2018-11-06 DIAGNOSIS — N189 Chronic kidney disease, unspecified: Secondary | ICD-10-CM | POA: Diagnosis not present

## 2018-11-06 DIAGNOSIS — K5909 Other constipation: Secondary | ICD-10-CM | POA: Diagnosis not present

## 2018-11-06 DIAGNOSIS — M5430 Sciatica, unspecified side: Secondary | ICD-10-CM | POA: Diagnosis not present

## 2018-11-07 DIAGNOSIS — D631 Anemia in chronic kidney disease: Secondary | ICD-10-CM | POA: Diagnosis not present

## 2018-11-07 DIAGNOSIS — D509 Iron deficiency anemia, unspecified: Secondary | ICD-10-CM | POA: Diagnosis not present

## 2018-11-07 DIAGNOSIS — Z992 Dependence on renal dialysis: Secondary | ICD-10-CM | POA: Diagnosis not present

## 2018-11-07 DIAGNOSIS — N2581 Secondary hyperparathyroidism of renal origin: Secondary | ICD-10-CM | POA: Diagnosis not present

## 2018-11-07 DIAGNOSIS — N186 End stage renal disease: Secondary | ICD-10-CM | POA: Diagnosis not present

## 2018-11-10 DIAGNOSIS — Z992 Dependence on renal dialysis: Secondary | ICD-10-CM | POA: Diagnosis not present

## 2018-11-10 DIAGNOSIS — D509 Iron deficiency anemia, unspecified: Secondary | ICD-10-CM | POA: Diagnosis not present

## 2018-11-10 DIAGNOSIS — N186 End stage renal disease: Secondary | ICD-10-CM | POA: Diagnosis not present

## 2018-11-10 DIAGNOSIS — N2581 Secondary hyperparathyroidism of renal origin: Secondary | ICD-10-CM | POA: Diagnosis not present

## 2018-11-10 DIAGNOSIS — D631 Anemia in chronic kidney disease: Secondary | ICD-10-CM | POA: Diagnosis not present

## 2018-11-13 DIAGNOSIS — N2581 Secondary hyperparathyroidism of renal origin: Secondary | ICD-10-CM | POA: Diagnosis not present

## 2018-11-13 DIAGNOSIS — N186 End stage renal disease: Secondary | ICD-10-CM | POA: Diagnosis not present

## 2018-11-13 DIAGNOSIS — Z992 Dependence on renal dialysis: Secondary | ICD-10-CM | POA: Diagnosis not present

## 2018-11-13 DIAGNOSIS — D631 Anemia in chronic kidney disease: Secondary | ICD-10-CM | POA: Diagnosis not present

## 2018-11-13 DIAGNOSIS — D509 Iron deficiency anemia, unspecified: Secondary | ICD-10-CM | POA: Diagnosis not present

## 2018-11-14 DIAGNOSIS — D509 Iron deficiency anemia, unspecified: Secondary | ICD-10-CM | POA: Diagnosis not present

## 2018-11-14 DIAGNOSIS — Z992 Dependence on renal dialysis: Secondary | ICD-10-CM | POA: Diagnosis not present

## 2018-11-14 DIAGNOSIS — N186 End stage renal disease: Secondary | ICD-10-CM | POA: Diagnosis not present

## 2018-11-14 DIAGNOSIS — D631 Anemia in chronic kidney disease: Secondary | ICD-10-CM | POA: Diagnosis not present

## 2018-11-14 DIAGNOSIS — N2581 Secondary hyperparathyroidism of renal origin: Secondary | ICD-10-CM | POA: Diagnosis not present

## 2018-11-17 DIAGNOSIS — D631 Anemia in chronic kidney disease: Secondary | ICD-10-CM | POA: Diagnosis not present

## 2018-11-17 DIAGNOSIS — N186 End stage renal disease: Secondary | ICD-10-CM | POA: Diagnosis not present

## 2018-11-17 DIAGNOSIS — N2581 Secondary hyperparathyroidism of renal origin: Secondary | ICD-10-CM | POA: Diagnosis not present

## 2018-11-17 DIAGNOSIS — D509 Iron deficiency anemia, unspecified: Secondary | ICD-10-CM | POA: Diagnosis not present

## 2018-11-17 DIAGNOSIS — Z992 Dependence on renal dialysis: Secondary | ICD-10-CM | POA: Diagnosis not present

## 2018-11-19 DIAGNOSIS — D509 Iron deficiency anemia, unspecified: Secondary | ICD-10-CM | POA: Diagnosis not present

## 2018-11-19 DIAGNOSIS — N2581 Secondary hyperparathyroidism of renal origin: Secondary | ICD-10-CM | POA: Diagnosis not present

## 2018-11-19 DIAGNOSIS — D631 Anemia in chronic kidney disease: Secondary | ICD-10-CM | POA: Diagnosis not present

## 2018-11-19 DIAGNOSIS — Z992 Dependence on renal dialysis: Secondary | ICD-10-CM | POA: Diagnosis not present

## 2018-11-19 DIAGNOSIS — N186 End stage renal disease: Secondary | ICD-10-CM | POA: Diagnosis not present

## 2018-11-21 DIAGNOSIS — Z992 Dependence on renal dialysis: Secondary | ICD-10-CM | POA: Diagnosis not present

## 2018-11-21 DIAGNOSIS — N2581 Secondary hyperparathyroidism of renal origin: Secondary | ICD-10-CM | POA: Diagnosis not present

## 2018-11-21 DIAGNOSIS — N186 End stage renal disease: Secondary | ICD-10-CM | POA: Diagnosis not present

## 2018-11-21 DIAGNOSIS — D631 Anemia in chronic kidney disease: Secondary | ICD-10-CM | POA: Diagnosis not present

## 2018-11-21 DIAGNOSIS — D509 Iron deficiency anemia, unspecified: Secondary | ICD-10-CM | POA: Diagnosis not present

## 2018-11-24 DIAGNOSIS — D509 Iron deficiency anemia, unspecified: Secondary | ICD-10-CM | POA: Diagnosis not present

## 2018-11-24 DIAGNOSIS — N186 End stage renal disease: Secondary | ICD-10-CM | POA: Diagnosis not present

## 2018-11-24 DIAGNOSIS — D631 Anemia in chronic kidney disease: Secondary | ICD-10-CM | POA: Diagnosis not present

## 2018-11-24 DIAGNOSIS — N2581 Secondary hyperparathyroidism of renal origin: Secondary | ICD-10-CM | POA: Diagnosis not present

## 2018-11-24 DIAGNOSIS — Z992 Dependence on renal dialysis: Secondary | ICD-10-CM | POA: Diagnosis not present

## 2018-11-28 DIAGNOSIS — N2581 Secondary hyperparathyroidism of renal origin: Secondary | ICD-10-CM | POA: Diagnosis not present

## 2018-11-28 DIAGNOSIS — N186 End stage renal disease: Secondary | ICD-10-CM | POA: Diagnosis not present

## 2018-11-28 DIAGNOSIS — D631 Anemia in chronic kidney disease: Secondary | ICD-10-CM | POA: Diagnosis not present

## 2018-11-28 DIAGNOSIS — Z992 Dependence on renal dialysis: Secondary | ICD-10-CM | POA: Diagnosis not present

## 2018-11-28 DIAGNOSIS — D509 Iron deficiency anemia, unspecified: Secondary | ICD-10-CM | POA: Diagnosis not present

## 2018-12-01 DIAGNOSIS — D509 Iron deficiency anemia, unspecified: Secondary | ICD-10-CM | POA: Diagnosis not present

## 2018-12-01 DIAGNOSIS — Z992 Dependence on renal dialysis: Secondary | ICD-10-CM | POA: Diagnosis not present

## 2018-12-01 DIAGNOSIS — N2581 Secondary hyperparathyroidism of renal origin: Secondary | ICD-10-CM | POA: Diagnosis not present

## 2018-12-01 DIAGNOSIS — N186 End stage renal disease: Secondary | ICD-10-CM | POA: Diagnosis not present

## 2018-12-01 DIAGNOSIS — D631 Anemia in chronic kidney disease: Secondary | ICD-10-CM | POA: Diagnosis not present

## 2018-12-03 DIAGNOSIS — N186 End stage renal disease: Secondary | ICD-10-CM | POA: Diagnosis not present

## 2018-12-03 DIAGNOSIS — D509 Iron deficiency anemia, unspecified: Secondary | ICD-10-CM | POA: Diagnosis not present

## 2018-12-03 DIAGNOSIS — N2581 Secondary hyperparathyroidism of renal origin: Secondary | ICD-10-CM | POA: Diagnosis not present

## 2018-12-03 DIAGNOSIS — Z992 Dependence on renal dialysis: Secondary | ICD-10-CM | POA: Diagnosis not present

## 2018-12-03 DIAGNOSIS — D631 Anemia in chronic kidney disease: Secondary | ICD-10-CM | POA: Diagnosis not present

## 2018-12-05 DIAGNOSIS — D509 Iron deficiency anemia, unspecified: Secondary | ICD-10-CM | POA: Diagnosis not present

## 2018-12-05 DIAGNOSIS — Z992 Dependence on renal dialysis: Secondary | ICD-10-CM | POA: Diagnosis not present

## 2018-12-05 DIAGNOSIS — N186 End stage renal disease: Secondary | ICD-10-CM | POA: Diagnosis not present

## 2018-12-05 DIAGNOSIS — N2581 Secondary hyperparathyroidism of renal origin: Secondary | ICD-10-CM | POA: Diagnosis not present

## 2018-12-05 DIAGNOSIS — D631 Anemia in chronic kidney disease: Secondary | ICD-10-CM | POA: Diagnosis not present

## 2018-12-08 DIAGNOSIS — N2581 Secondary hyperparathyroidism of renal origin: Secondary | ICD-10-CM | POA: Diagnosis not present

## 2018-12-08 DIAGNOSIS — N186 End stage renal disease: Secondary | ICD-10-CM | POA: Diagnosis not present

## 2018-12-08 DIAGNOSIS — D631 Anemia in chronic kidney disease: Secondary | ICD-10-CM | POA: Diagnosis not present

## 2018-12-08 DIAGNOSIS — D509 Iron deficiency anemia, unspecified: Secondary | ICD-10-CM | POA: Diagnosis not present

## 2018-12-08 DIAGNOSIS — Z992 Dependence on renal dialysis: Secondary | ICD-10-CM | POA: Diagnosis not present

## 2018-12-10 DIAGNOSIS — D509 Iron deficiency anemia, unspecified: Secondary | ICD-10-CM | POA: Diagnosis not present

## 2018-12-10 DIAGNOSIS — N186 End stage renal disease: Secondary | ICD-10-CM | POA: Diagnosis not present

## 2018-12-10 DIAGNOSIS — N2581 Secondary hyperparathyroidism of renal origin: Secondary | ICD-10-CM | POA: Diagnosis not present

## 2018-12-10 DIAGNOSIS — D631 Anemia in chronic kidney disease: Secondary | ICD-10-CM | POA: Diagnosis not present

## 2018-12-10 DIAGNOSIS — Z992 Dependence on renal dialysis: Secondary | ICD-10-CM | POA: Diagnosis not present

## 2018-12-11 DIAGNOSIS — Z992 Dependence on renal dialysis: Secondary | ICD-10-CM | POA: Diagnosis not present

## 2018-12-11 DIAGNOSIS — N186 End stage renal disease: Secondary | ICD-10-CM | POA: Diagnosis not present

## 2018-12-15 DIAGNOSIS — N186 End stage renal disease: Secondary | ICD-10-CM | POA: Diagnosis not present

## 2018-12-15 DIAGNOSIS — D509 Iron deficiency anemia, unspecified: Secondary | ICD-10-CM | POA: Diagnosis not present

## 2018-12-15 DIAGNOSIS — N2581 Secondary hyperparathyroidism of renal origin: Secondary | ICD-10-CM | POA: Diagnosis not present

## 2018-12-15 DIAGNOSIS — Z992 Dependence on renal dialysis: Secondary | ICD-10-CM | POA: Diagnosis not present

## 2018-12-15 DIAGNOSIS — D631 Anemia in chronic kidney disease: Secondary | ICD-10-CM | POA: Diagnosis not present

## 2018-12-17 DIAGNOSIS — N186 End stage renal disease: Secondary | ICD-10-CM | POA: Diagnosis not present

## 2018-12-17 DIAGNOSIS — N2581 Secondary hyperparathyroidism of renal origin: Secondary | ICD-10-CM | POA: Diagnosis not present

## 2018-12-17 DIAGNOSIS — D509 Iron deficiency anemia, unspecified: Secondary | ICD-10-CM | POA: Diagnosis not present

## 2018-12-17 DIAGNOSIS — D631 Anemia in chronic kidney disease: Secondary | ICD-10-CM | POA: Diagnosis not present

## 2018-12-17 DIAGNOSIS — Z992 Dependence on renal dialysis: Secondary | ICD-10-CM | POA: Diagnosis not present

## 2018-12-19 DIAGNOSIS — N2581 Secondary hyperparathyroidism of renal origin: Secondary | ICD-10-CM | POA: Diagnosis not present

## 2018-12-19 DIAGNOSIS — D631 Anemia in chronic kidney disease: Secondary | ICD-10-CM | POA: Diagnosis not present

## 2018-12-19 DIAGNOSIS — D509 Iron deficiency anemia, unspecified: Secondary | ICD-10-CM | POA: Diagnosis not present

## 2018-12-19 DIAGNOSIS — N186 End stage renal disease: Secondary | ICD-10-CM | POA: Diagnosis not present

## 2018-12-19 DIAGNOSIS — Z992 Dependence on renal dialysis: Secondary | ICD-10-CM | POA: Diagnosis not present

## 2018-12-22 DIAGNOSIS — D509 Iron deficiency anemia, unspecified: Secondary | ICD-10-CM | POA: Diagnosis not present

## 2018-12-22 DIAGNOSIS — N186 End stage renal disease: Secondary | ICD-10-CM | POA: Diagnosis not present

## 2018-12-22 DIAGNOSIS — D631 Anemia in chronic kidney disease: Secondary | ICD-10-CM | POA: Diagnosis not present

## 2018-12-22 DIAGNOSIS — Z992 Dependence on renal dialysis: Secondary | ICD-10-CM | POA: Diagnosis not present

## 2018-12-22 DIAGNOSIS — N2581 Secondary hyperparathyroidism of renal origin: Secondary | ICD-10-CM | POA: Diagnosis not present

## 2018-12-24 DIAGNOSIS — D509 Iron deficiency anemia, unspecified: Secondary | ICD-10-CM | POA: Diagnosis not present

## 2018-12-24 DIAGNOSIS — N2581 Secondary hyperparathyroidism of renal origin: Secondary | ICD-10-CM | POA: Diagnosis not present

## 2018-12-24 DIAGNOSIS — Z992 Dependence on renal dialysis: Secondary | ICD-10-CM | POA: Diagnosis not present

## 2018-12-24 DIAGNOSIS — N186 End stage renal disease: Secondary | ICD-10-CM | POA: Diagnosis not present

## 2018-12-24 DIAGNOSIS — D631 Anemia in chronic kidney disease: Secondary | ICD-10-CM | POA: Diagnosis not present

## 2018-12-26 DIAGNOSIS — Z992 Dependence on renal dialysis: Secondary | ICD-10-CM | POA: Diagnosis not present

## 2018-12-26 DIAGNOSIS — D509 Iron deficiency anemia, unspecified: Secondary | ICD-10-CM | POA: Diagnosis not present

## 2018-12-26 DIAGNOSIS — D631 Anemia in chronic kidney disease: Secondary | ICD-10-CM | POA: Diagnosis not present

## 2018-12-26 DIAGNOSIS — N186 End stage renal disease: Secondary | ICD-10-CM | POA: Diagnosis not present

## 2018-12-26 DIAGNOSIS — N2581 Secondary hyperparathyroidism of renal origin: Secondary | ICD-10-CM | POA: Diagnosis not present

## 2018-12-29 DIAGNOSIS — D631 Anemia in chronic kidney disease: Secondary | ICD-10-CM | POA: Diagnosis not present

## 2018-12-29 DIAGNOSIS — N2581 Secondary hyperparathyroidism of renal origin: Secondary | ICD-10-CM | POA: Diagnosis not present

## 2018-12-29 DIAGNOSIS — Z992 Dependence on renal dialysis: Secondary | ICD-10-CM | POA: Diagnosis not present

## 2018-12-29 DIAGNOSIS — N186 End stage renal disease: Secondary | ICD-10-CM | POA: Diagnosis not present

## 2018-12-29 DIAGNOSIS — D509 Iron deficiency anemia, unspecified: Secondary | ICD-10-CM | POA: Diagnosis not present

## 2018-12-31 DIAGNOSIS — D509 Iron deficiency anemia, unspecified: Secondary | ICD-10-CM | POA: Diagnosis not present

## 2018-12-31 DIAGNOSIS — N2581 Secondary hyperparathyroidism of renal origin: Secondary | ICD-10-CM | POA: Diagnosis not present

## 2018-12-31 DIAGNOSIS — D631 Anemia in chronic kidney disease: Secondary | ICD-10-CM | POA: Diagnosis not present

## 2018-12-31 DIAGNOSIS — Z992 Dependence on renal dialysis: Secondary | ICD-10-CM | POA: Diagnosis not present

## 2018-12-31 DIAGNOSIS — N186 End stage renal disease: Secondary | ICD-10-CM | POA: Diagnosis not present

## 2019-01-02 DIAGNOSIS — N2581 Secondary hyperparathyroidism of renal origin: Secondary | ICD-10-CM | POA: Diagnosis not present

## 2019-01-02 DIAGNOSIS — N186 End stage renal disease: Secondary | ICD-10-CM | POA: Diagnosis not present

## 2019-01-02 DIAGNOSIS — Z992 Dependence on renal dialysis: Secondary | ICD-10-CM | POA: Diagnosis not present

## 2019-01-02 DIAGNOSIS — D631 Anemia in chronic kidney disease: Secondary | ICD-10-CM | POA: Diagnosis not present

## 2019-01-02 DIAGNOSIS — D509 Iron deficiency anemia, unspecified: Secondary | ICD-10-CM | POA: Diagnosis not present

## 2019-01-06 DIAGNOSIS — Z992 Dependence on renal dialysis: Secondary | ICD-10-CM | POA: Diagnosis not present

## 2019-01-06 DIAGNOSIS — D509 Iron deficiency anemia, unspecified: Secondary | ICD-10-CM | POA: Diagnosis not present

## 2019-01-06 DIAGNOSIS — N186 End stage renal disease: Secondary | ICD-10-CM | POA: Diagnosis not present

## 2019-01-06 DIAGNOSIS — D631 Anemia in chronic kidney disease: Secondary | ICD-10-CM | POA: Diagnosis not present

## 2019-01-06 DIAGNOSIS — N2581 Secondary hyperparathyroidism of renal origin: Secondary | ICD-10-CM | POA: Diagnosis not present

## 2019-01-07 DIAGNOSIS — N2581 Secondary hyperparathyroidism of renal origin: Secondary | ICD-10-CM | POA: Diagnosis not present

## 2019-01-07 DIAGNOSIS — N186 End stage renal disease: Secondary | ICD-10-CM | POA: Diagnosis not present

## 2019-01-07 DIAGNOSIS — D631 Anemia in chronic kidney disease: Secondary | ICD-10-CM | POA: Diagnosis not present

## 2019-01-07 DIAGNOSIS — Z992 Dependence on renal dialysis: Secondary | ICD-10-CM | POA: Diagnosis not present

## 2019-01-07 DIAGNOSIS — D509 Iron deficiency anemia, unspecified: Secondary | ICD-10-CM | POA: Diagnosis not present

## 2019-01-09 DIAGNOSIS — D509 Iron deficiency anemia, unspecified: Secondary | ICD-10-CM | POA: Diagnosis not present

## 2019-01-09 DIAGNOSIS — N2581 Secondary hyperparathyroidism of renal origin: Secondary | ICD-10-CM | POA: Diagnosis not present

## 2019-01-09 DIAGNOSIS — Z992 Dependence on renal dialysis: Secondary | ICD-10-CM | POA: Diagnosis not present

## 2019-01-09 DIAGNOSIS — D631 Anemia in chronic kidney disease: Secondary | ICD-10-CM | POA: Diagnosis not present

## 2019-01-09 DIAGNOSIS — N186 End stage renal disease: Secondary | ICD-10-CM | POA: Diagnosis not present

## 2019-01-11 DIAGNOSIS — N186 End stage renal disease: Secondary | ICD-10-CM | POA: Diagnosis not present

## 2019-01-11 DIAGNOSIS — Z992 Dependence on renal dialysis: Secondary | ICD-10-CM | POA: Diagnosis not present

## 2019-01-12 DIAGNOSIS — N186 End stage renal disease: Secondary | ICD-10-CM | POA: Diagnosis not present

## 2019-01-12 DIAGNOSIS — Z992 Dependence on renal dialysis: Secondary | ICD-10-CM | POA: Diagnosis not present

## 2019-01-12 DIAGNOSIS — D631 Anemia in chronic kidney disease: Secondary | ICD-10-CM | POA: Diagnosis not present

## 2019-01-12 DIAGNOSIS — D509 Iron deficiency anemia, unspecified: Secondary | ICD-10-CM | POA: Diagnosis not present

## 2019-01-12 DIAGNOSIS — N2581 Secondary hyperparathyroidism of renal origin: Secondary | ICD-10-CM | POA: Diagnosis not present

## 2019-01-14 ENCOUNTER — Telehealth: Payer: Self-pay | Admitting: Vascular Surgery

## 2019-01-16 DIAGNOSIS — Z992 Dependence on renal dialysis: Secondary | ICD-10-CM | POA: Diagnosis not present

## 2019-01-16 DIAGNOSIS — N2581 Secondary hyperparathyroidism of renal origin: Secondary | ICD-10-CM | POA: Diagnosis not present

## 2019-01-16 DIAGNOSIS — D631 Anemia in chronic kidney disease: Secondary | ICD-10-CM | POA: Diagnosis not present

## 2019-01-16 DIAGNOSIS — D509 Iron deficiency anemia, unspecified: Secondary | ICD-10-CM | POA: Diagnosis not present

## 2019-01-16 DIAGNOSIS — N186 End stage renal disease: Secondary | ICD-10-CM | POA: Diagnosis not present

## 2019-01-19 DIAGNOSIS — D509 Iron deficiency anemia, unspecified: Secondary | ICD-10-CM | POA: Diagnosis not present

## 2019-01-19 DIAGNOSIS — Z992 Dependence on renal dialysis: Secondary | ICD-10-CM | POA: Diagnosis not present

## 2019-01-19 DIAGNOSIS — N186 End stage renal disease: Secondary | ICD-10-CM | POA: Diagnosis not present

## 2019-01-19 DIAGNOSIS — D631 Anemia in chronic kidney disease: Secondary | ICD-10-CM | POA: Diagnosis not present

## 2019-01-19 DIAGNOSIS — N2581 Secondary hyperparathyroidism of renal origin: Secondary | ICD-10-CM | POA: Diagnosis not present

## 2019-01-21 DIAGNOSIS — N186 End stage renal disease: Secondary | ICD-10-CM | POA: Diagnosis not present

## 2019-01-21 DIAGNOSIS — N2581 Secondary hyperparathyroidism of renal origin: Secondary | ICD-10-CM | POA: Diagnosis not present

## 2019-01-21 DIAGNOSIS — Z992 Dependence on renal dialysis: Secondary | ICD-10-CM | POA: Diagnosis not present

## 2019-01-21 DIAGNOSIS — D509 Iron deficiency anemia, unspecified: Secondary | ICD-10-CM | POA: Diagnosis not present

## 2019-01-21 DIAGNOSIS — D631 Anemia in chronic kidney disease: Secondary | ICD-10-CM | POA: Diagnosis not present

## 2019-01-23 DIAGNOSIS — D631 Anemia in chronic kidney disease: Secondary | ICD-10-CM | POA: Diagnosis not present

## 2019-01-23 DIAGNOSIS — Z992 Dependence on renal dialysis: Secondary | ICD-10-CM | POA: Diagnosis not present

## 2019-01-23 DIAGNOSIS — N186 End stage renal disease: Secondary | ICD-10-CM | POA: Diagnosis not present

## 2019-01-23 DIAGNOSIS — N2581 Secondary hyperparathyroidism of renal origin: Secondary | ICD-10-CM | POA: Diagnosis not present

## 2019-01-23 DIAGNOSIS — D509 Iron deficiency anemia, unspecified: Secondary | ICD-10-CM | POA: Diagnosis not present

## 2019-01-28 DIAGNOSIS — D509 Iron deficiency anemia, unspecified: Secondary | ICD-10-CM | POA: Diagnosis not present

## 2019-01-28 DIAGNOSIS — Z992 Dependence on renal dialysis: Secondary | ICD-10-CM | POA: Diagnosis not present

## 2019-01-28 DIAGNOSIS — N2581 Secondary hyperparathyroidism of renal origin: Secondary | ICD-10-CM | POA: Diagnosis not present

## 2019-01-28 DIAGNOSIS — D631 Anemia in chronic kidney disease: Secondary | ICD-10-CM | POA: Diagnosis not present

## 2019-01-28 DIAGNOSIS — N186 End stage renal disease: Secondary | ICD-10-CM | POA: Diagnosis not present

## 2019-01-29 ENCOUNTER — Other Ambulatory Visit: Payer: Self-pay

## 2019-01-29 ENCOUNTER — Ambulatory Visit (INDEPENDENT_AMBULATORY_CARE_PROVIDER_SITE_OTHER): Payer: Medicare Other | Admitting: Vascular Surgery

## 2019-01-29 ENCOUNTER — Encounter: Payer: Self-pay | Admitting: Vascular Surgery

## 2019-01-29 ENCOUNTER — Encounter: Payer: Self-pay | Admitting: *Deleted

## 2019-01-29 VITALS — BP 175/89 | HR 73 | Temp 98.0°F | Resp 20 | Ht 67.0 in | Wt 213.7 lb

## 2019-01-29 DIAGNOSIS — N186 End stage renal disease: Secondary | ICD-10-CM

## 2019-01-29 DIAGNOSIS — Z992 Dependence on renal dialysis: Secondary | ICD-10-CM

## 2019-01-29 NOTE — H&P (View-Only) (Signed)
Referring Physician: Dr Hinda Lenis  Patient name: Alejandro Lee MRN: [id number] DOB: [birth year] Sex: male  REASON FOR CONSULT: Poorly functioning right arm AV fistula  HPI: Alejandro Lee is a 58 y.o. male, with a poorly functioning right arm AV fistula.  He has now developed significant right upper extremity arm swelling.  He is also had multiple infiltrations in the past.  He also has aneurysmal degeneration of the fistula.  He has had 1 prior failed access in the left arm.  Dialysis today is Monday Wednesday Friday.  He has had plication of the right arm fistula on 2 prior occasions.  This was done by Dr. Donzetta Matters December 2019 and prior to that Dr. Geryl Councilman May 2019.  He also previously had angioplasty of the right innominate and subclavian vein by Dr. Trula Slade December 2019.  Other medical problems include hyperlipidemia hypertension both of which have been stable.  Past Medical History:  Diagnosis Date  . Arthritis   . Back pain   . CHF (congestive heart failure) ([place])   . Chronic kidney disease    dialysis M/W/F  . Diabetes mellitus without complication ([place])   . GERD (gastroesophageal reflux disease)   . Headache    migraines  . Hyperlipidemia   . Hypertension   . Myocardial infarction [facility])    patient states it was seen on EKG, denies a cardiac cath   Past Surgical History:  Procedure Laterality Date  . A/V FISTULAGRAM Right 07/22/2018   Procedure: A/V FISTULAGRAM;  Surgeon: Serafina [place], MD;  Location: [place] [facility];  Service: Cardiovascular;  Laterality: Right;  . AV FISTULA PLACEMENT Right   . AV FISTULA PLACEMENT Right 12/87/8676   Procedure: PLICATION OF RIGHT arm FISTULA;  Surgeon: Waynetta Sandy, MD;  Location: [place];  Service: Vascular;  Laterality: Right;  . diabetic cyst removal Bilateral    on buttocks x8  . EYE SURGERY    . FISTULA SUPERFICIALIZATION Right 03/01/9469   Procedure: PLICATION OF ARTERIOVENOUS FISTULA RIGHT ARM;  Surgeon: Conrad [place],  MD;  Location: [place];  Service: Vascular;  Laterality: Right;  . lens replacement Left   . PERIPHERAL VASCULAR BALLOON ANGIOPLASTY  07/22/2018   Procedure: PERIPHERAL VASCULAR BALLOON ANGIOPLASTY;  Surgeon: Serafina [place], MD;  Location: [place] [facility];  Service: Cardiovascular;;  Right [place] fistula   . REFRACTIVE SURGERY Bilateral     Family History  Problem Relation Age of Onset  . Diabetes Mother   . Hypertension Mother   . Diabetes Father   . Heart disease Father   . Cancer Father   . Hypertension Father   . Diabetes Sister   . Hypertension Sister   . Diabetes Brother   . Hypertension Brother   . Cancer Brother     SOCIAL HISTORY: Social History   Socioeconomic History  . Marital status: Married    Spouse name: Not on file  . Number of children: Not on file  . Years of education: Not on file  . Highest education level: Not on file  Occupational History  . Not on file  Social Needs  . Financial resource strain: Very hard  . Food insecurity    Worry: Often true    Inability: Often true  . Transportation needs    Medical: No    Non-medical: No  Tobacco Use  . Smoking status: Former Smoker    Types: Cigarettes  . Smokeless tobacco: Never Used  Substance and Sexual  Activity  . Alcohol use: Not Currently  . Drug use: Never  . Sexual activity: Yes  Lifestyle  . Physical activity    Days per week: 7 days    Minutes per session: 10 min  . Stress: Very much  Relationships  . Social Herbalist on phone: More than three times a week    Gets together: Never    Attends religious service: Never    Active member of club or organization: No    Attends meetings of clubs or organizations: Never    Relationship status: Living with partner  . Intimate partner violence    Fear of current or ex partner: No    Emotionally abused: No    Physically abused: No    Forced sexual activity: No  Other Topics Concern  . Not on file  Social History Narrative   . Not on file    No Known Allergies  Current Outpatient Medications  Medication Sig Dispense Refill  . acetaminophen (TYLENOL) 325 MG tablet Take 650 mg by mouth daily as needed for headache.    Marland Kitchen amLODipine (NORVASC) 5 MG tablet Take 1 tablet (5 mg total) by mouth daily. 30 tablet 2  . calcium acetate (PHOSLO) 667 MG capsule Take 2,001 mg by mouth 3 (three) times daily with meals.     . lidocaine-prilocaine (EMLA) cream Apply 1 application topically as needed (port acces).     No current facility-administered medications for this visit.     ROS:   General:  No weight loss, Fever, chills  HEENT: No recent headaches, no nasal bleeding, no visual changes, no sore throat  Neurologic: No dizziness, blackouts, seizures. No recent symptoms of stroke or mini- stroke. No recent episodes of slurred speech, or temporary blindness.  Cardiac: No recent episodes of chest pain/pressure, no shortness of breath at rest.  + shortness of breath with exertion.  Denies history of atrial fibrillation or irregular heartbeat  Vascular: No history of rest pain in feet.  No history of claudication.  No history of non-healing ulcer, No history of DVT   Pulmonary: No home oxygen, no productive cough, no hemoptysis,  No asthma or wheezing  Musculoskeletal:  [ ]  Arthritis, [ ]  Low back pain,  [ ]  Joint pain  Hematologic:No history of hypercoagulable state.  No history of easy bleeding.  No history of anemia  Gastrointestinal: No hematochezia or melena,  No gastroesophageal reflux, no trouble swallowing  Urinary: [X]  chronic Kidney disease, [X]  on HD - [X]  MWF or [ ]  TTHS, [ ]  Burning with urination, [ ]  Frequent urination, [ ]  Difficulty urinating;   Skin: No rashes  Psychological: No history of anxiety,  No history of depression   Physical Examination  Vitals:   01/29/19 1526  BP: (!) 175/89  Pulse: 73  Resp: 20  Temp: 98 F (36.7 C)  SpO2: 100%  Weight: 213 lb 11.2 oz (96.9 kg)  Height: 5'  7" (1.702 m)    Body mass index is 33.47 kg/m.  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 Trula Slade next week.  2.  Depending on the findings of that central venogram the patient is scheduled for a plication of his AV fistula by Dr. Trula Slade on Thursday that week.  However, if the findings of the venogram suggest that the access is not really salvageable then a better option might would be just ligation of the fistula and planning for a new access to prevent bleeding episodes.  Dr. Trula Slade will make that decision based on the central venogram findings on next Tuesday.   Ruta Hinds, MD [facility] Office: [phone] Pager: [phone]

## 2019-01-29 NOTE — Progress Notes (Signed)
Referring Physician: Dr Hinda Lenis  Patient name: Alejandro Lee MRN: [id number] DOB: 1960/09/15 Sex: male  REASON FOR CONSULT: Poorly functioning right arm AV fistula  HPI: Alejandro Lee is a 58 y.o. male, with a poorly functioning right arm AV fistula.  He has now developed significant right upper extremity arm swelling.  He is also had multiple infiltrations in the past.  He also has aneurysmal degeneration of the fistula.  He has had 1 prior failed access in the left arm.  Dialysis today is Monday Wednesday Friday.  He has had plication of the right arm fistula on 2 prior occasions.  This was done by Dr. Donzetta Matters December 2019 and prior to that Dr. Geryl Councilman May 2019.  He also previously had angioplasty of the right innominate and subclavian vein by Dr. Trula Slade December 2019.  Other medical problems include hyperlipidemia hypertension both of which have been stable.  Past Medical History:  Diagnosis Date  . Arthritis   . Back pain   . CHF (congestive heart failure) ([place])   . Chronic kidney disease    dialysis M/W/F  . Diabetes mellitus without complication ([place])   . GERD (gastroesophageal reflux disease)   . Headache    migraines  . Hyperlipidemia   . Hypertension   . Myocardial infarction [facility])    patient states it was seen on EKG, denies a cardiac cath   Past Surgical History:  Procedure Laterality Date  . A/V FISTULAGRAM Right 07/22/2018   Procedure: A/V FISTULAGRAM;  Surgeon: Serafina Mitchell, MD;  Location: [place] [facility];  Service: Cardiovascular;  Laterality: Right;  . AV FISTULA PLACEMENT Right   . AV FISTULA PLACEMENT Right 27/51/7001   Procedure: PLICATION OF RIGHT arm FISTULA;  Surgeon: Waynetta Sandy, MD;  Location: [place];  Service: Vascular;  Laterality: Right;  . diabetic cyst removal Bilateral    on buttocks x8  . EYE SURGERY    . FISTULA SUPERFICIALIZATION Right 7/49/4496   Procedure: PLICATION OF ARTERIOVENOUS FISTULA RIGHT ARM;  Surgeon: Conrad [place],  MD;  Location: [place];  Service: Vascular;  Laterality: Right;  . lens replacement Left   . PERIPHERAL VASCULAR BALLOON ANGIOPLASTY  07/22/2018   Procedure: PERIPHERAL VASCULAR BALLOON ANGIOPLASTY;  Surgeon: Serafina Mitchell, MD;  Location: [place] [facility];  Service: Cardiovascular;;  Right Farm fistula   . REFRACTIVE SURGERY Bilateral     Family History  Problem Relation Age of Onset  . Diabetes Mother   . Hypertension Mother   . Diabetes Father   . Heart disease Father   . Cancer Father   . Hypertension Father   . Diabetes Sister   . Hypertension Sister   . Diabetes Brother   . Hypertension Brother   . Cancer Brother     SOCIAL HISTORY: Social History   Socioeconomic History  . Marital status: Married    Spouse name: Not on file  . Number of children: Not on file  . Years of education: Not on file  . Highest education level: Not on file  Occupational History  . Not on file  Social Needs  . Financial resource strain: Very hard  . Food insecurity    Worry: Often true    Inability: Often true  . Transportation needs    Medical: No    Non-medical: No  Tobacco Use  . Smoking status: Former Smoker    Types: Cigarettes  . Smokeless tobacco: Never Used  Substance and Sexual  Activity  . Alcohol use: Not Currently  . Drug use: Never  . Sexual activity: Yes  Lifestyle  . Physical activity    Days per week: 7 days    Minutes per session: 10 min  . Stress: Very much  Relationships  . Social Herbalist on phone: More than three times a week    Gets together: Never    Attends religious service: Never    Active member of club or organization: No    Attends meetings of clubs or organizations: Never    Relationship status: Living with partner  . Intimate partner violence    Fear of current or ex partner: No    Emotionally abused: No    Physically abused: No    Forced sexual activity: No  Other Topics Concern  . Not on file  Social History Narrative   . Not on file    No Known Allergies  Current Outpatient Medications  Medication Sig Dispense Refill  . acetaminophen (TYLENOL) 325 MG tablet Take 650 mg by mouth daily as needed for headache.    Marland Kitchen amLODipine (NORVASC) 5 MG tablet Take 1 tablet (5 mg total) by mouth daily. 30 tablet 2  . calcium acetate (PHOSLO) 667 MG capsule Take 2,001 mg by mouth 3 (three) times daily with meals.     . lidocaine-prilocaine (EMLA) cream Apply 1 application topically as needed (port acces).     No current facility-administered medications for this visit.     ROS:   General:  No weight loss, Fever, chills  HEENT: No recent headaches, no nasal bleeding, no visual changes, no sore throat  Neurologic: No dizziness, blackouts, seizures. No recent symptoms of stroke or mini- stroke. No recent episodes of slurred speech, or temporary blindness.  Cardiac: No recent episodes of chest pain/pressure, no shortness of breath at rest.  + shortness of breath with exertion.  Denies history of atrial fibrillation or irregular heartbeat  Vascular: No history of rest pain in feet.  No history of claudication.  No history of non-healing ulcer, No history of DVT   Pulmonary: No home oxygen, no productive cough, no hemoptysis,  No asthma or wheezing  Musculoskeletal:  [ ]  Arthritis, [ ]  Low back pain,  [ ]  Joint pain  Hematologic:No history of hypercoagulable state.  No history of easy bleeding.  No history of anemia  Gastrointestinal: No hematochezia or melena,  No gastroesophageal reflux, no trouble swallowing  Urinary: [X]  chronic Kidney disease, [X]  on HD - [X]  MWF or [ ]  TTHS, [ ]  Burning with urination, [ ]  Frequent urination, [ ]  Difficulty urinating;   Skin: No rashes  Psychological: No history of anxiety,  No history of depression   Physical Examination  Vitals:   01/29/19 1526  BP: (!) 175/89  Pulse: 73  Resp: 20  Temp: 98 F (36.7 C)  SpO2: 100%  Weight: 213 lb 11.2 oz (96.9 kg)  Height: 5'  7" (1.702 m)    Body mass index is 33.47 kg/m.  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 Trula Slade next week.  2.  Depending on the findings of that central venogram the patient is scheduled for a plication of his AV fistula by Dr. Trula Slade on Thursday that week.  However, if the findings of the venogram suggest that the access is not really salvageable then a better option might would be just ligation of the fistula and planning for a new access to prevent bleeding episodes.  Dr. Trula Slade will make that decision based on the central venogram findings on next Tuesday.   Ruta Hinds, MD [facility] [place] Office: [phone] Pager: [phone]

## 2019-01-29 NOTE — H&P (View-Only) (Signed)
Referring Physician: Dr Hinda Lenis  Patient name: Alejandro Lee MRN: [id number] DOB: 30-Sep-1960 Sex: male  REASON FOR CONSULT: Poorly functioning right arm AV fistula  HPI: Alejandro Lee is a 58 y.o. male, with a poorly functioning right arm AV fistula.  He has now developed significant right upper extremity arm swelling.  He is also had multiple infiltrations in the past.  He also has aneurysmal degeneration of the fistula.  He has had 1 prior failed access in the left arm.  Dialysis today is Monday Wednesday Friday.  He has had plication of the right arm fistula on 2 prior occasions.  This was done by Dr. Donzetta Matters December 2019 and prior to that Dr. Geryl Councilman May 2019.  He also previously had angioplasty of the right innominate and subclavian vein by Dr. Trula Slade December 2019.  Other medical problems include hyperlipidemia hypertension both of which have been stable.  Past Medical History:  Diagnosis Date  . Arthritis   . Back pain   . CHF (congestive heart failure) ([place])   . Chronic kidney disease    dialysis M/W/F  . Diabetes mellitus without complication ([place])   . GERD (gastroesophageal reflux disease)   . Headache    migraines  . Hyperlipidemia   . Hypertension   . Myocardial infarction [facility])    patient states it was seen on EKG, denies a cardiac cath   Past Surgical History:  Procedure Laterality Date  . A/V FISTULAGRAM Right 07/22/2018   Procedure: A/V FISTULAGRAM;  Surgeon: Serafina Mitchell, MD;  Location: [place] [facility];  Service: Cardiovascular;  Laterality: Right;  . AV FISTULA PLACEMENT Right   . AV FISTULA PLACEMENT Right 16/60/6301   Procedure: PLICATION OF RIGHT arm FISTULA;  Surgeon: Waynetta Sandy, MD;  Location: [place];  Service: Vascular;  Laterality: Right;  . diabetic cyst removal Bilateral    on buttocks x8  . EYE SURGERY    . FISTULA SUPERFICIALIZATION Right 01/12/931   Procedure: PLICATION OF ARTERIOVENOUS FISTULA RIGHT ARM;  Surgeon: Conrad Dell,  MD;  Location: [place];  Service: Vascular;  Laterality: Right;  . lens replacement Left   . PERIPHERAL VASCULAR BALLOON ANGIOPLASTY  07/22/2018   Procedure: PERIPHERAL VASCULAR BALLOON ANGIOPLASTY;  Surgeon: Serafina Mitchell, MD;  Location: [place] [facility];  Service: Cardiovascular;;  Right [place] fistula   . REFRACTIVE SURGERY Bilateral     Family History  Problem Relation Age of Onset  . Diabetes Mother   . Hypertension Mother   . Diabetes Father   . Heart disease Father   . Cancer Father   . Hypertension Father   . Diabetes Sister   . Hypertension Sister   . Diabetes Brother   . Hypertension Brother   . Cancer Brother     SOCIAL HISTORY: Social History   Socioeconomic History  . Marital status: Married    Spouse name: Not on file  . Number of children: Not on file  . Years of education: Not on file  . Highest education level: Not on file  Occupational History  . Not on file  Social Needs  . Financial resource strain: Very hard  . Food insecurity    Worry: Often true    Inability: Often true  . Transportation needs    Medical: No    Non-medical: No  Tobacco Use  . Smoking status: Former Smoker    Types: Cigarettes  . Smokeless tobacco: Never Used  Substance and Sexual  Activity  . Alcohol use: Not Currently  . Drug use: Never  . Sexual activity: Yes  Lifestyle  . Physical activity    Days per week: 7 days    Minutes per session: 10 min  . Stress: Very much  Relationships  . Social Herbalist on phone: More than three times a week    Gets together: Never    Attends religious service: Never    Active member of club or organization: No    Attends meetings of clubs or organizations: Never    Relationship status: Living with partner  . Intimate partner violence    Fear of current or ex partner: No    Emotionally abused: No    Physically abused: No    Forced sexual activity: No  Other Topics Concern  . Not on file  Social History Narrative   . Not on file    No Known Allergies  Current Outpatient Medications  Medication Sig Dispense Refill  . acetaminophen (TYLENOL) 325 MG tablet Take 650 mg by mouth daily as needed for headache.    Marland Kitchen amLODipine (NORVASC) 5 MG tablet Take 1 tablet (5 mg total) by mouth daily. 30 tablet 2  . calcium acetate (PHOSLO) 667 MG capsule Take 2,001 mg by mouth 3 (three) times daily with meals.     . lidocaine-prilocaine (EMLA) cream Apply 1 application topically as needed (port acces).     No current facility-administered medications for this visit.     ROS:   General:  No weight loss, Fever, chills  HEENT: No recent headaches, no nasal bleeding, no visual changes, no sore throat  Neurologic: No dizziness, blackouts, seizures. No recent symptoms of stroke or mini- stroke. No recent episodes of slurred speech, or temporary blindness.  Cardiac: No recent episodes of chest pain/pressure, no shortness of breath at rest.  + shortness of breath with exertion.  Denies history of atrial fibrillation or irregular heartbeat  Vascular: No history of rest pain in feet.  No history of claudication.  No history of non-healing ulcer, No history of DVT   Pulmonary: No home oxygen, no productive cough, no hemoptysis,  No asthma or wheezing  Musculoskeletal:  [ ]  Arthritis, [ ]  Low back pain,  [ ]  Joint pain  Hematologic:No history of hypercoagulable state.  No history of easy bleeding.  No history of anemia  Gastrointestinal: No hematochezia or melena,  No gastroesophageal reflux, no trouble swallowing  Urinary: [X]  chronic Kidney disease, [X]  on HD - [X]  MWF or [ ]  TTHS, [ ]  Burning with urination, [ ]  Frequent urination, [ ]  Difficulty urinating;   Skin: No rashes  Psychological: No history of anxiety,  No history of depression   Physical Examination  Vitals:   01/29/19 1526  BP: (!) 175/89  Pulse: 73  Resp: 20  Temp: 98 F (36.7 C)  SpO2: 100%  Weight: 213 lb 11.2 oz (96.9 kg)  Height: 5'  7" (1.702 m)    Body mass index is 33.47 kg/m.  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 Trula Slade next week.  2.  Depending on the findings of that central venogram the patient is scheduled for a plication of his AV fistula by Dr. Trula Slade on Thursday that week.  However, if the findings of the venogram suggest that the access is not really salvageable then a better option might would be just ligation of the fistula and planning for a new access to prevent bleeding episodes.  Dr. Trula Slade will make that decision based on the central venogram findings on next Tuesday.   Ruta Hinds, MD [facility] [place] Office: [phone] Pager: [phone]

## 2019-01-30 ENCOUNTER — Other Ambulatory Visit: Payer: Self-pay | Admitting: *Deleted

## 2019-01-30 DIAGNOSIS — Z992 Dependence on renal dialysis: Secondary | ICD-10-CM | POA: Diagnosis not present

## 2019-01-30 DIAGNOSIS — N186 End stage renal disease: Secondary | ICD-10-CM | POA: Diagnosis not present

## 2019-01-30 DIAGNOSIS — D509 Iron deficiency anemia, unspecified: Secondary | ICD-10-CM | POA: Diagnosis not present

## 2019-01-30 DIAGNOSIS — D631 Anemia in chronic kidney disease: Secondary | ICD-10-CM | POA: Diagnosis not present

## 2019-01-30 DIAGNOSIS — N2581 Secondary hyperparathyroidism of renal origin: Secondary | ICD-10-CM | POA: Diagnosis not present

## 2019-02-02 ENCOUNTER — Other Ambulatory Visit: Payer: Self-pay

## 2019-02-02 ENCOUNTER — Other Ambulatory Visit (HOSPITAL_COMMUNITY)
Admission: RE | Admit: 2019-02-02 | Discharge: 2019-02-02 | Disposition: A | Discharge status: Home / Self Care | Payer: Medicare Other | Source: Ambulatory Visit | Attending: Surgery | Admitting: Surgery

## 2019-02-02 DIAGNOSIS — Z1159 Encounter for screening for other viral diseases: Secondary | ICD-10-CM | POA: Diagnosis not present

## 2019-02-02 DIAGNOSIS — D509 Iron deficiency anemia, unspecified: Secondary | ICD-10-CM | POA: Diagnosis not present

## 2019-02-02 DIAGNOSIS — N186 End stage renal disease: Secondary | ICD-10-CM | POA: Diagnosis not present

## 2019-02-02 DIAGNOSIS — N2581 Secondary hyperparathyroidism of renal origin: Secondary | ICD-10-CM | POA: Diagnosis not present

## 2019-02-02 DIAGNOSIS — Z992 Dependence on renal dialysis: Secondary | ICD-10-CM | POA: Diagnosis not present

## 2019-02-02 DIAGNOSIS — D631 Anemia in chronic kidney disease: Secondary | ICD-10-CM | POA: Diagnosis not present

## 2019-02-02 LAB — SARS CORONAVIRUS 2 BY RT PCR (HOSPITAL ORDER, PERFORMED IN ~~LOC~~ HOSPITAL LAB): SARS Coronavirus 2: NEGATIVE

## 2019-02-03 ENCOUNTER — Encounter (HOSPITAL_COMMUNITY)
Admission: RE | Disposition: A | Discharge status: Home / Self Care | Payer: Self-pay | Source: Home / Self Care | Attending: Surgery

## 2019-02-03 ENCOUNTER — Ambulatory Visit (HOSPITAL_COMMUNITY)
Admission: RE | Admit: 2019-02-03 | Discharge: 2019-02-03 | Disposition: A | Discharge status: Home / Self Care | Payer: Medicare Other | Attending: Surgery | Admitting: Surgery

## 2019-02-03 ENCOUNTER — Encounter (HOSPITAL_COMMUNITY): Payer: Self-pay | Admitting: Surgery

## 2019-02-03 ENCOUNTER — Other Ambulatory Visit: Payer: Self-pay | Admitting: *Deleted

## 2019-02-03 DIAGNOSIS — N185 Chronic kidney disease, stage 5: Secondary | ICD-10-CM | POA: Diagnosis not present

## 2019-02-03 DIAGNOSIS — Z87891 Personal history of nicotine dependence: Secondary | ICD-10-CM | POA: Insufficient documentation

## 2019-02-03 DIAGNOSIS — Z992 Dependence on renal dialysis: Secondary | ICD-10-CM | POA: Insufficient documentation

## 2019-02-03 DIAGNOSIS — T82898A Other specified complication of vascular prosthetic devices, implants and grafts, initial encounter: Secondary | ICD-10-CM | POA: Diagnosis not present

## 2019-02-03 DIAGNOSIS — Z833 Family history of diabetes mellitus: Secondary | ICD-10-CM | POA: Diagnosis not present

## 2019-02-03 DIAGNOSIS — I252 Old myocardial infarction: Secondary | ICD-10-CM | POA: Diagnosis not present

## 2019-02-03 DIAGNOSIS — M1990 Unspecified osteoarthritis, unspecified site: Secondary | ICD-10-CM | POA: Insufficient documentation

## 2019-02-03 DIAGNOSIS — E785 Hyperlipidemia, unspecified: Secondary | ICD-10-CM | POA: Insufficient documentation

## 2019-02-03 DIAGNOSIS — I509 Heart failure, unspecified: Secondary | ICD-10-CM | POA: Insufficient documentation

## 2019-02-03 DIAGNOSIS — K219 Gastro-esophageal reflux disease without esophagitis: Secondary | ICD-10-CM | POA: Diagnosis not present

## 2019-02-03 DIAGNOSIS — I132 Hypertensive heart and chronic kidney disease with heart failure and with stage 5 chronic kidney disease, or end stage renal disease: Secondary | ICD-10-CM | POA: Diagnosis not present

## 2019-02-03 DIAGNOSIS — Z8249 Family history of ischemic heart disease and other diseases of the circulatory system: Secondary | ICD-10-CM | POA: Diagnosis not present

## 2019-02-03 DIAGNOSIS — Y832 Surgical operation with anastomosis, bypass or graft as the cause of abnormal reaction of the patient, or of later complication, without mention of misadventure at the time of the procedure: Secondary | ICD-10-CM | POA: Insufficient documentation

## 2019-02-03 DIAGNOSIS — T82868A Thrombosis of vascular prosthetic devices, implants and grafts, initial encounter: Secondary | ICD-10-CM | POA: Diagnosis not present

## 2019-02-03 DIAGNOSIS — Z79899 Other long term (current) drug therapy: Secondary | ICD-10-CM | POA: Diagnosis not present

## 2019-02-03 DIAGNOSIS — E1122 Type 2 diabetes mellitus with diabetic chronic kidney disease: Secondary | ICD-10-CM | POA: Diagnosis not present

## 2019-02-03 HISTORY — PX: PERIPHERAL VASCULAR BALLOON ANGIOPLASTY: CATH118281

## 2019-02-03 LAB — POCT I-STAT, CHEM 8
BUN: 59 mg/dL — ABNORMAL HIGH (ref 6–20)
Calcium, Ion: 1.19 mmol/L (ref 1.15–1.40)
Chloride: 100 mmol/L (ref 98–111)
Creatinine, Ser: 10.8 mg/dL — ABNORMAL HIGH (ref 0.61–1.24)
Glucose, Bld: 132 mg/dL — ABNORMAL HIGH (ref 70–99)
HCT: 40 % (ref 39.0–52.0)
Hemoglobin: 13.6 g/dL (ref 13.0–17.0)
Potassium: 4.5 mmol/L (ref 3.5–5.1)
Sodium: 139 mmol/L (ref 135–145)
TCO2: 33 mmol/L — ABNORMAL HIGH (ref 22–32)

## 2019-02-03 SURGERY — PERIPHERAL VASCULAR BALLOON ANGIOPLASTY
Anesthesia: LOCAL | Laterality: Right

## 2019-02-03 MED ORDER — MIDAZOLAM HCL 2 MG/2ML IJ SOLN
INTRAMUSCULAR | Status: AC
  Filled 2019-02-03: qty 2

## 2019-02-03 MED ORDER — IODIXANOL 320 MG/ML IV SOLN
INTRAVENOUS | Status: DC | PRN
  Administered 2019-02-03: 30 mL via INTRAVENOUS

## 2019-02-03 MED ORDER — HEPARIN (PORCINE) IN NACL 1000-0.9 UT/500ML-% IV SOLN
INTRAVENOUS | Status: AC
  Filled 2019-02-03: qty 500

## 2019-02-03 MED ORDER — LIDOCAINE HCL (PF) 1 % IJ SOLN
INTRAMUSCULAR | Status: AC
  Filled 2019-02-03: qty 30

## 2019-02-03 MED ORDER — FENTANYL CITRATE (PF) 100 MCG/2ML IJ SOLN
INTRAMUSCULAR | Status: DC | PRN
  Administered 2019-02-03: 50 ug via INTRAVENOUS

## 2019-02-03 MED ORDER — LIDOCAINE HCL (PF) 1 % IJ SOLN
INTRAMUSCULAR | Status: DC | PRN
  Administered 2019-02-03: 5 mL

## 2019-02-03 MED ORDER — SODIUM CHLORIDE 0.9% FLUSH: 3.0000 mL | INTRAVENOUS | Status: DC | PRN

## 2019-02-03 MED ORDER — FENTANYL CITRATE (PF) 100 MCG/2ML IJ SOLN
INTRAMUSCULAR | Status: AC
  Filled 2019-02-03: qty 2

## 2019-02-03 MED ORDER — HEPARIN (PORCINE) IN NACL 1000-0.9 UT/500ML-% IV SOLN
INTRAVENOUS | Status: DC | PRN
  Administered 2019-02-03: 500 mL

## 2019-02-03 MED ORDER — MIDAZOLAM HCL 2 MG/2ML IJ SOLN
INTRAMUSCULAR | Status: DC | PRN
  Administered 2019-02-03: 2 mg via INTRAVENOUS

## 2019-02-03 SURGICAL SUPPLY — 13 items
BAG SNAP BAND KOVER 36X36 (MISCELLANEOUS) ×2 IMPLANT
CATH ANGIO 5F BER2 65CM (CATHETERS) ×1 IMPLANT
COVER DOME SNAP 22 D (MISCELLANEOUS) ×2 IMPLANT
DEVICE TORQUE H2O (MISCELLANEOUS) ×1 IMPLANT
GUIDEWIRE ANGLED .035X150CM (WIRE) ×1 IMPLANT
KIT MICROPUNCTURE NIT STIFF (SHEATH) ×1 IMPLANT
PROTECTION STATION PRESSURIZED (MISCELLANEOUS) ×2
SHEATH PINNACLE R/O II 5F 6CM (SHEATH) ×1 IMPLANT
SHEATH PROBE COVER 6X72 (BAG) ×2 IMPLANT
STATION PROTECTION PRESSURIZED (MISCELLANEOUS) ×1 IMPLANT
STOPCOCK MORSE 400PSI 3WAY (MISCELLANEOUS) ×3 IMPLANT
TRAY PV CATH (CUSTOM PROCEDURE TRAY) ×2 IMPLANT
TUBING CIL FLEX 10 FLL-RA (TUBING) ×2 IMPLANT

## 2019-02-03 NOTE — Op Note (Signed)
02/03/2019 Pre-operative Diagnosis: ESRD Post-operative diagnosis:  Same Surgeon:  Annamarie Major Procedure Performed:  1.  U/s guided access right cephalic vein fistula  2.  Fistulogram  3.  Failed angioplasty, cephalic vein  4.  Conscious sedation (24 minutes)    Indications: The patient comes in today for evaluation of his fistula  Procedure:  The patient was identified in the holding area and taken to room 8.  The patient was then placed supine on the table and prepped and draped in the usual sterile fashion.  A time out was called.  Conscious sedation was administered with the use of IV fentanyl and Versed under continuous physician and nurse monitoring.  Heart rate, blood pressure, and oxygen saturations were continuously monitored.  Total sedation time was 24 minutes ultrasound was used to evaluate the fistula.  The vein was patent and compressible.  A digital ultrasound image was acquired.  The fistula was then accessed under ultrasound guidance using a micropuncture needle.  An 018 wire was then asvanced without resistance and a micropuncture sheath was placed.  Contrast injections were then performed through the sheath.  Findings: Aneurysmal changes were noted at the arterial venous anastomosis.  The fistula is occluded at the level of the axilla.   Intervention: After the images were acquired the decision was made to proceed with intervention.  I inserted a Glidewire and was able to get access into the axillary vein.  Once this was done I placed a 5 French sheath followed by a Berenstein catheter.  I tried to recanalize the occlusion however this was unsuccessful after multiple attempts.  I ultimately elected to stop as I did not think this was salvageable.  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 Annamarie Major, M.D., [facility] [place] Office: [phone] Pager:   [phone]

## 2019-02-03 NOTE — Interval H&P Note (Signed)
History and Physical Interval Note:  02/03/2019 9:47 AM  Alejandro Lee  has presented today for surgery, with the diagnosis of Complication with fistula.  The various methods of treatment have been discussed with the patient and family. After consideration of risks, benefits and other options for treatment, the patient has consented to  Procedure(s): A/V FISTULAGRAM (Right) as a surgical intervention.  The patient's history has been reviewed, patient examined, no change in status, stable for surgery.  I have reviewed the patient's chart and labs.  Questions were answered to the patient's satisfaction.     Annamarie Major

## 2019-02-03 NOTE — Discharge Instructions (Signed)

## 2019-02-04 ENCOUNTER — Other Ambulatory Visit: Payer: Self-pay

## 2019-02-04 ENCOUNTER — Encounter (HOSPITAL_COMMUNITY): Payer: Self-pay | Admitting: *Deleted

## 2019-02-04 DIAGNOSIS — N2581 Secondary hyperparathyroidism of renal origin: Secondary | ICD-10-CM | POA: Diagnosis not present

## 2019-02-04 DIAGNOSIS — Z992 Dependence on renal dialysis: Secondary | ICD-10-CM | POA: Diagnosis not present

## 2019-02-04 DIAGNOSIS — N186 End stage renal disease: Secondary | ICD-10-CM | POA: Diagnosis not present

## 2019-02-04 DIAGNOSIS — D631 Anemia in chronic kidney disease: Secondary | ICD-10-CM | POA: Diagnosis not present

## 2019-02-04 DIAGNOSIS — D509 Iron deficiency anemia, unspecified: Secondary | ICD-10-CM | POA: Diagnosis not present

## 2019-02-04 NOTE — Progress Notes (Signed)
   02/04/19 1724  OBSTRUCTIVE SLEEP APNEA  Have you ever been diagnosed with sleep apnea through a sleep study? No  Do you snore loudly (loud enough to be heard through closed doors)?  1  Do you often feel tired, fatigued, or sleepy during the daytime (such as falling asleep during driving or talking to someone)? 0  Has anyone observed you stop breathing during your sleep? 1  Do you have, or are you being treated for high blood pressure? 1  BMI more than 35 kg/m2? 0  Age > 50 (1-yes) 1  Neck circumference greater than:Male 16 inches or larger, Male 17inches or larger? 1  Male Gender (Yes=1) 1  Obstructive Sleep Apnea Score 6

## 2019-02-04 NOTE — Progress Notes (Signed)
Pt denies SOB, chest pain, and being under the care of a cardiologist. Pt stated that a cardiac cath was performed  "4-5 years ago-everything was okay."  Nurse requested LOV note, cath report and any cardiac studies from [facility] in [place]. Pt made aware to stop taking vitamins, fish oil and herbal medications. Do not take any NSAIDs ie: Ibuprofen, Advil, Naproxen (Aleve), Motrin, BC and Goody Powder.  Pt denies that he and family members tested positive for COVID-19.  Pt denies that he and family members experienced the following symptoms:  Cough yes/no: No Fever (>100.38F)  yes/no: No Runny nose yes/no: No Sore throat yes/no: No Difficulty breathing/shortness of breath  yes/no: No  Have you or a family member traveled in the last 14 days and where? yes/no: No  Pt reminded that hospital visitation restrictions are in effect and the importance of the restrictions.   Pt verbalized understanding of all pre-op instructions.

## 2019-02-05 ENCOUNTER — Ambulatory Visit (HOSPITAL_COMMUNITY): Payer: Medicare Other | Admitting: Anesthesiology

## 2019-02-05 ENCOUNTER — Other Ambulatory Visit: Payer: Self-pay

## 2019-02-05 ENCOUNTER — Encounter (HOSPITAL_COMMUNITY)
Admission: RE | Disposition: A | Discharge status: Home / Self Care | Payer: Self-pay | Source: Home / Self Care | Attending: Surgery

## 2019-02-05 ENCOUNTER — Ambulatory Visit (HOSPITAL_COMMUNITY)
Admission: RE | Admit: 2019-02-05 | Discharge: 2019-02-05 | Disposition: A | Discharge status: Home / Self Care | Payer: Medicare Other | Attending: Surgery | Admitting: Surgery

## 2019-02-05 DIAGNOSIS — E1122 Type 2 diabetes mellitus with diabetic chronic kidney disease: Secondary | ICD-10-CM | POA: Insufficient documentation

## 2019-02-05 DIAGNOSIS — Z809 Family history of malignant neoplasm, unspecified: Secondary | ICD-10-CM | POA: Diagnosis not present

## 2019-02-05 DIAGNOSIS — N186 End stage renal disease: Secondary | ICD-10-CM | POA: Diagnosis not present

## 2019-02-05 DIAGNOSIS — E875 Hyperkalemia: Secondary | ICD-10-CM | POA: Diagnosis not present

## 2019-02-05 DIAGNOSIS — E785 Hyperlipidemia, unspecified: Secondary | ICD-10-CM | POA: Insufficient documentation

## 2019-02-05 DIAGNOSIS — Z87891 Personal history of nicotine dependence: Secondary | ICD-10-CM | POA: Diagnosis not present

## 2019-02-05 DIAGNOSIS — Z833 Family history of diabetes mellitus: Secondary | ICD-10-CM | POA: Diagnosis not present

## 2019-02-05 DIAGNOSIS — K219 Gastro-esophageal reflux disease without esophagitis: Secondary | ICD-10-CM | POA: Insufficient documentation

## 2019-02-05 DIAGNOSIS — M1990 Unspecified osteoarthritis, unspecified site: Secondary | ICD-10-CM | POA: Diagnosis not present

## 2019-02-05 DIAGNOSIS — Z8249 Family history of ischemic heart disease and other diseases of the circulatory system: Secondary | ICD-10-CM | POA: Insufficient documentation

## 2019-02-05 DIAGNOSIS — T82590A Other mechanical complication of surgically created arteriovenous fistula, initial encounter: Secondary | ICD-10-CM | POA: Diagnosis not present

## 2019-02-05 DIAGNOSIS — I509 Heart failure, unspecified: Secondary | ICD-10-CM | POA: Diagnosis not present

## 2019-02-05 DIAGNOSIS — T829XXA Unspecified complication of cardiac and vascular prosthetic device, implant and graft, initial encounter: Secondary | ICD-10-CM | POA: Diagnosis not present

## 2019-02-05 DIAGNOSIS — Z79899 Other long term (current) drug therapy: Secondary | ICD-10-CM | POA: Diagnosis not present

## 2019-02-05 DIAGNOSIS — I252 Old myocardial infarction: Secondary | ICD-10-CM | POA: Insufficient documentation

## 2019-02-05 DIAGNOSIS — I132 Hypertensive heart and chronic kidney disease with heart failure and with stage 5 chronic kidney disease, or end stage renal disease: Secondary | ICD-10-CM | POA: Insufficient documentation

## 2019-02-05 DIAGNOSIS — N185 Chronic kidney disease, stage 5: Secondary | ICD-10-CM | POA: Diagnosis not present

## 2019-02-05 DIAGNOSIS — X58XXXA Exposure to other specified factors, initial encounter: Secondary | ICD-10-CM | POA: Diagnosis not present

## 2019-02-05 DIAGNOSIS — Z992 Dependence on renal dialysis: Secondary | ICD-10-CM | POA: Insufficient documentation

## 2019-02-05 HISTORY — DX: Legal blindness, as defined in USA: H54.8

## 2019-02-05 HISTORY — PX: BASCILIC VEIN TRANSPOSITION: SHX5742

## 2019-02-05 HISTORY — DX: Acute embolism and thrombosis of unspecified deep veins of unspecified lower extremity: I82.409

## 2019-02-05 LAB — GLUCOSE, CAPILLARY
Glucose-Capillary: 104 mg/dL — ABNORMAL HIGH (ref 70–99)
Glucose-Capillary: 124 mg/dL — ABNORMAL HIGH (ref 70–99)

## 2019-02-05 LAB — POCT I-STAT 4, (NA,K, GLUC, HGB,HCT)
Glucose, Bld: 126 mg/dL — ABNORMAL HIGH (ref 70–99)
HCT: 40 % (ref 39.0–52.0)
Hemoglobin: 13.6 g/dL (ref 13.0–17.0)
Potassium: 4.5 mmol/L (ref 3.5–5.1)
Sodium: 140 mmol/L (ref 135–145)

## 2019-02-05 SURGERY — TRANSPOSITION, VEIN, BASILIC
Anesthesia: General | Site: Arm Upper | Laterality: Left

## 2019-02-05 MED ORDER — MIDAZOLAM HCL 2 MG/2ML IJ SOLN
INTRAMUSCULAR | Status: AC
  Filled 2019-02-05: qty 2

## 2019-02-05 MED ORDER — FENTANYL CITRATE (PF) 250 MCG/5ML IJ SOLN
INTRAMUSCULAR | Status: AC
  Filled 2019-02-05: qty 5

## 2019-02-05 MED ORDER — ONDANSETRON HCL 4 MG/2ML IJ SOLN
INTRAMUSCULAR | Status: DC | PRN
  Administered 2019-02-05: 4 mg via INTRAVENOUS

## 2019-02-05 MED ORDER — SODIUM CHLORIDE 0.9 % IV SOLN
INTRAVENOUS | Status: DC | PRN
  Administered 2019-02-05: 40 ug/min via INTRAVENOUS

## 2019-02-05 MED ORDER — SODIUM CHLORIDE 0.9 % IV SOLN
INTRAVENOUS | Status: DC
  Administered 2019-02-05 (×2): via INTRAVENOUS

## 2019-02-05 MED ORDER — FENTANYL CITRATE (PF) 100 MCG/2ML IJ SOLN: 25.0000 ug | INTRAMUSCULAR | Status: DC | PRN

## 2019-02-05 MED ORDER — HYDROCODONE-ACETAMINOPHEN 5-325 MG PO TABS: 1.0000 | ORAL_TABLET | Freq: Four times a day (QID) | ORAL | 0 refills | Status: DC | PRN

## 2019-02-05 MED ORDER — 0.9 % SODIUM CHLORIDE (POUR BTL) OPTIME
TOPICAL | Status: DC | PRN
  Administered 2019-02-05: 1000 mL

## 2019-02-05 MED ORDER — SODIUM CHLORIDE 0.9 % IV SOLN
INTRAVENOUS | Status: AC
  Filled 2019-02-05: qty 1.2

## 2019-02-05 MED ORDER — SODIUM CHLORIDE 0.9 % IV SOLN
INTRAVENOUS | Status: DC | PRN
  Administered 2019-02-05: 500 mL

## 2019-02-05 MED ORDER — MIDAZOLAM HCL 2 MG/2ML IJ SOLN
INTRAMUSCULAR | Status: DC | PRN
  Administered 2019-02-05: 2 mg via INTRAVENOUS

## 2019-02-05 MED ORDER — PROPOFOL 10 MG/ML IV BOLUS
INTRAVENOUS | Status: DC | PRN
  Administered 2019-02-05: 20 mg via INTRAVENOUS
  Administered 2019-02-05: 130 mg via INTRAVENOUS

## 2019-02-05 MED ORDER — CEFAZOLIN SODIUM-DEXTROSE 2-4 GM/100ML-% IV SOLN
INTRAVENOUS | Status: AC
  Filled 2019-02-05: qty 100

## 2019-02-05 MED ORDER — EPHEDRINE SULFATE-NACL 50-0.9 MG/10ML-% IV SOSY
PREFILLED_SYRINGE | INTRAVENOUS | Status: DC | PRN
  Administered 2019-02-05 (×2): 15 mg via INTRAVENOUS

## 2019-02-05 MED ORDER — PROMETHAZINE HCL 25 MG/ML IJ SOLN: 6.2500 mg | INTRAMUSCULAR | Status: DC | PRN

## 2019-02-05 MED ORDER — LIDOCAINE-EPINEPHRINE (PF) 1 %-1:200000 IJ SOLN: INTRAMUSCULAR | Status: DC | PRN

## 2019-02-05 MED ORDER — CHLORHEXIDINE GLUCONATE 4 % EX LIQD: 60.0000 mL | Freq: Once | CUTANEOUS | Status: DC

## 2019-02-05 MED ORDER — FENTANYL CITRATE (PF) 250 MCG/5ML IJ SOLN
INTRAMUSCULAR | Status: DC | PRN
  Administered 2019-02-05 (×2): 25 ug via INTRAVENOUS
  Administered 2019-02-05 (×2): 50 ug via INTRAVENOUS
  Administered 2019-02-05: 25 ug via INTRAVENOUS

## 2019-02-05 MED ORDER — LIDOCAINE-EPINEPHRINE (PF) 1 %-1:200000 IJ SOLN
INTRAMUSCULAR | Status: AC
  Filled 2019-02-05: qty 30

## 2019-02-05 MED ORDER — CEFAZOLIN SODIUM-DEXTROSE 2-4 GM/100ML-% IV SOLN
2.0000 g | INTRAVENOUS | Status: AC
  Administered 2019-02-05: 10:00:00 2 g via INTRAVENOUS

## 2019-02-05 MED ORDER — LIDOCAINE 2% (20 MG/ML) 5 ML SYRINGE
INTRAMUSCULAR | Status: DC | PRN
  Administered 2019-02-05: 100 mg via INTRAVENOUS

## 2019-02-05 SURGICAL SUPPLY — 38 items
ARMBAND PINK RESTRICT EXTREMIT (MISCELLANEOUS) ×3 IMPLANT
CANISTER SUCT 3000ML PPV (MISCELLANEOUS) ×3 IMPLANT
CLIP VESOCCLUDE MED 24/CT (CLIP) ×2 IMPLANT
CLIP VESOCCLUDE MED 6/CT (CLIP) ×2 IMPLANT
CLIP VESOCCLUDE SM WIDE 24/CT (CLIP) IMPLANT
CLIP VESOCCLUDE SM WIDE 6/CT (CLIP) IMPLANT
COVER PROBE W GEL 5X96 (DRAPES) ×3 IMPLANT
COVER WAND RF STERILE (DRAPES) ×1 IMPLANT
DERMABOND ADVANCED (GAUZE/BANDAGES/DRESSINGS) ×2
DERMABOND ADVANCED .7 DNX12 (GAUZE/BANDAGES/DRESSINGS) ×1 IMPLANT
ELECT REM PT RETURN 9FT ADLT (ELECTROSURGICAL) ×3
ELECTRODE REM PT RTRN 9FT ADLT (ELECTROSURGICAL) ×1 IMPLANT
GLOVE BIOGEL PI IND STRL 6.5 (GLOVE) IMPLANT
GLOVE BIOGEL PI IND STRL 7.5 (GLOVE) ×1 IMPLANT
GLOVE BIOGEL PI IND STRL 8 (GLOVE) IMPLANT
GLOVE BIOGEL PI INDICATOR 6.5 (GLOVE) ×4
GLOVE BIOGEL PI INDICATOR 7.5 (GLOVE) ×2
GLOVE BIOGEL PI INDICATOR 8 (GLOVE) ×4
GLOVE SURG SS PI 7.5 STRL IVOR (GLOVE) ×3 IMPLANT
GOWN STRL REUS W/ TWL LRG LVL3 (GOWN DISPOSABLE) ×2 IMPLANT
GOWN STRL REUS W/ TWL XL LVL3 (GOWN DISPOSABLE) ×1 IMPLANT
GOWN STRL REUS W/TWL 2XL LVL3 (GOWN DISPOSABLE) ×4 IMPLANT
GOWN STRL REUS W/TWL LRG LVL3 (GOWN DISPOSABLE) ×2
GOWN STRL REUS W/TWL XL LVL3 (GOWN DISPOSABLE) ×4
HEMOSTAT SNOW SURGICEL 2X4 (HEMOSTASIS) IMPLANT
KIT BASIN OR (CUSTOM PROCEDURE TRAY) ×3 IMPLANT
KIT TURNOVER KIT B (KITS) ×3 IMPLANT
NS IRRIG 1000ML POUR BTL (IV SOLUTION) ×3 IMPLANT
PACK CV ACCESS (CUSTOM PROCEDURE TRAY) ×3 IMPLANT
PAD ARMBOARD 7.5X6 YLW CONV (MISCELLANEOUS) ×6 IMPLANT
SUT PROLENE 6 0 CC (SUTURE) ×7 IMPLANT
SUT SILK 2 0 SH (SUTURE) IMPLANT
SUT VIC AB 3-0 SH 27 (SUTURE) ×2
SUT VIC AB 3-0 SH 27X BRD (SUTURE) ×1 IMPLANT
SUT VICRYL 4-0 PS2 18IN ABS (SUTURE) ×3 IMPLANT
TOWEL GREEN STERILE (TOWEL DISPOSABLE) ×3 IMPLANT
UNDERPAD 30X30 (UNDERPADS AND DIAPERS) ×3 IMPLANT
WATER STERILE IRR 1000ML POUR (IV SOLUTION) ×3 IMPLANT

## 2019-02-05 NOTE — Transfer of Care (Signed)
Immediate Anesthesia Transfer of Care Note  Patient: Alejandro Lee  Procedure(s) Performed: BRACHIOCEPHALIC FISTULA  LEFT ARM (Left Arm Upper)  Patient Location: PACU  Anesthesia Type:General  Level of Consciousness: drowsy and responds to stimulation  Airway & Oxygen Therapy: Patient Spontanous Breathing  Post-op Assessment: Report given to RN and Post -op Vital signs reviewed and stable  Post vital signs: Reviewed and stable  Last Vitals:  Vitals Value Taken Time  BP    Temp    Pulse    Resp    SpO2      Last Pain:  Vitals:   02/05/19 0749  PainSc: 0-No pain         Complications: No apparent anesthesia complications

## 2019-02-05 NOTE — Anesthesia Postprocedure Evaluation (Signed)
Anesthesia Post Note  Patient: Alejandro Lee  Procedure(s) Performed: BRACHIOCEPHALIC FISTULA  LEFT ARM (Left Arm Upper)     Patient location during evaluation: PACU Anesthesia Type: General Level of consciousness: awake and alert Pain management: pain level controlled Vital Signs Assessment: post-procedure vital signs reviewed and stable Respiratory status: spontaneous breathing, nonlabored ventilation, respiratory function stable and patient connected to nasal cannula oxygen Cardiovascular status: stable and blood pressure returned to baseline Postop Assessment: no apparent nausea or vomiting Anesthetic complications: no    Last Vitals:  Vitals:   02/05/19 1206 02/05/19 1214  BP: (!) 157/48 (!) 145/63  Pulse: 85 86  Resp: 19 18  Temp: 36.6 C 36.6 C  SpO2: 100% 100%    Last Pain:  Vitals:   02/05/19 1214  PainSc: 0-No pain                 Jashiya Bassett S

## 2019-02-05 NOTE — Discharge Instructions (Signed)
° °  [facility] [place] ° °Discharge Instructions ° °AV Fistula or Graft Surgery for Dialysis Access ° °Please refer to the following instructions for your post-procedure care. Your surgeon or physician assistant will discuss any changes with you. ° °Activity ° °You may drive the day following your surgery, if you are comfortable and no longer taking prescription pain medication. Resume full activity as the soreness in your incision resolves. ° °Bathing/Showering ° °You may shower after you go home. Keep your incision dry for 48 hours. Do not soak in a bathtub, hot tub, or swim until the incision heals completely. You may not shower if you have a hemodialysis catheter. ° °Incision Care ° °Clean your incision with mild soap and water after 48 hours. Pat the area dry with a clean towel. You do not need a bandage unless otherwise instructed. Do not apply any ointments or creams to your incision. You may have skin glue on your incision. Do not peel it off. It will come off on its own in about one week. Your arm may swell a bit after surgery. To reduce swelling use pillows to elevate your arm so it is above your heart. Your doctor will tell you if you need to lightly wrap your arm with an ACE bandage. ° °Diet ° °Resume your normal diet. There are not special food restrictions following this procedure. In order to heal from your surgery, it is CRITICAL to get adequate nutrition. Your body requires vitamins, minerals, and protein. Vegetables are the best source of vitamins and minerals. Vegetables also provide the perfect balance of protein. Processed food has little nutritional value, so try to avoid this. ° °Medications ° °Resume taking all of your medications. If your incision is causing pain, you may take over-the counter pain relievers such as acetaminophen (Tylenol). If you were prescribed a stronger pain medication, please be aware these medications can cause nausea and constipation. Prevent  nausea by taking the medication with a snack or meal. Avoid constipation by drinking plenty of fluids and eating foods with high amount of fiber, such as fruits, vegetables, and grains. Do not take Tylenol if you are taking prescription pain medications. ° ° ° ° °Follow up °Your surgeon may want to see you in the office following your access surgery. If so, this will be arranged at the time of your surgery. ° °Please call us immediately for any of the following conditions: ° °Increased pain, redness, drainage (pus) from your incision site °Fever of 101 degrees or higher °Severe or worsening pain at your incision site °Hand pain or numbness. ° °Reduce your risk of vascular disease: ° °Stop smoking. If you would like help, call [facility] at 1-800-QUIT-NOW (1-800-784-8669) or [place] at 336-586-4000 ° °Manage your cholesterol °Maintain a desired weight °Control your diabetes °Keep your blood pressure down ° °Dialysis ° °It will take several weeks to several months for your new dialysis access to be ready for use. Your surgeon will determine when it is OK to use it. Your nephrologist will continue to direct your dialysis. You can continue to use your Permcath until your new access is ready for use. ° °If you have any questions, please call the office at [phone]. ° °

## 2019-02-05 NOTE — Progress Notes (Signed)
Verbal ok from Dr. Kalman Shan to place IV on rt arm.

## 2019-02-05 NOTE — Anesthesia Procedure Notes (Signed)
Procedure Name: LMA Insertion Date/Time: 02/05/2019 10:09 AM Performed by: Elayne Snare, CRNA Pre-anesthesia Checklist: Patient identified, Emergency Drugs available, Suction available and Patient being monitored Patient Re-evaluated:Patient Re-evaluated prior to induction Oxygen Delivery Method: Circle System Utilized Preoxygenation: Pre-oxygenation with 100% oxygen Induction Type: IV induction LMA: LMA inserted LMA Size: 4.0 Number of attempts: 1 Placement Confirmation: positive ETCO2 Tube secured with: Tape Dental Injury: Teeth and Oropharynx as per pre-operative assessment

## 2019-02-05 NOTE — Interval H&P Note (Signed)
History and Physical Interval Note:  02/05/2019 9:27 AM  Alejandro Lee  has presented today for surgery, with the diagnosis of COMPLICATION OF ARTERIOVENOUS FISTULA.  The various methods of treatment have been discussed with the patient and family. After consideration of risks, benefits and other options for treatment, the patient has consented to  Procedure(s): FIRST STAGE BASILIC VEIN TRANSPOSITION LEFT ARM (Left) as a surgical intervention.  The patient's history has been reviewed, patient examined, no change in status, stable for surgery.  I have reviewed the patient's chart and labs.  Questions were answered to the patient's satisfaction.     Annamarie Major

## 2019-02-05 NOTE — Op Note (Signed)
    Patient name: Alejandro Lee MRN: [id number] DOB: 10-24-1960 Sex: male  02/05/2019 Pre-operative Diagnosis: ESRD Post-operative diagnosis:  Same Surgeon:  Annamarie Major Assistants:  Arlee Muslim Procedure:   Left Brachio-cephalic fistula Anesthesia:  GEN Blood Loss:  minimal Specimens:  none  Findings:  I ended up using the cephalic vein below the [facility]  Indications:  The patient is in need of new access.  His right arm AVF is functioning, but he has a central vein occlusion  Procedure:  The patient was identified in the holding area and taken to [place] [facility]  The patient was then placed supine on the table. general anesthesia was administered.  The patient was prepped and draped in the usual sterile fashion.  A time out was called and antibiotics were administered.  I evaluated the surface veins in the left arm.  The cephalic vein was the only usable vein.  I made a transverse incision distal to the arm crease.  I dissected out the artery and vein.  The vein appeared to be 18mm.  The artery was exposed at the bifurcation and so each artery was isolated.  The vein was then ligated.  It distended nicely with saline.  The artery was then occluded and a end to side anastamosis was created.  After completion, there was a good thrill in the fistula and a palpable radial pulse.  Once hemostasis was achieved, the incision was closed with 2 layers of vicryl and dermabond   Disposition:  To PACU stable   V. Annamarie Major, M.D., [facility] [place] Office: [phone] Pager:  [phone]

## 2019-02-05 NOTE — Anesthesia Preprocedure Evaluation (Signed)
Anesthesia Evaluation  Patient identified by MRN, date of birth, ID band Patient awake    Reviewed: Allergy & Precautions, NPO status , Patient's Chart, lab work & pertinent test results  Airway Mallampati: II  TM Distance: >3 FB Neck ROM: Full    Dental no notable dental hx.    Pulmonary neg pulmonary ROS, former smoker,    Pulmonary exam normal breath sounds clear to auscultation       Cardiovascular hypertension, Normal cardiovascular exam Rhythm:Regular Rate:Normal     Neuro/Psych negative neurological ROS  negative psych ROS   GI/Hepatic negative GI ROS, Neg liver ROS,   Endo/Other  negative endocrine ROSdiabetes  Renal/GU DialysisRenal disease  negative genitourinary   Musculoskeletal negative musculoskeletal ROS (+)   Abdominal   Peds negative pediatric ROS (+)  Hematology negative hematology ROS (+)   Anesthesia Other Findings   Reproductive/Obstetrics negative OB ROS                             Anesthesia Physical Anesthesia Plan  ASA: III  Anesthesia Plan: General   Post-op Pain Management:    Induction: Intravenous  PONV Risk Score and Plan: 2 and Ondansetron and Treatment may vary due to age or medical condition  Airway Management Planned: LMA  Additional Equipment:   Intra-op Plan:   Post-operative Plan: Extubation in OR  Informed Consent: I have reviewed the patients History and Physical, chart, labs and discussed the procedure including the risks, benefits and alternatives for the proposed anesthesia with the patient or authorized representative who has indicated his/her understanding and acceptance.     Dental advisory given  Plan Discussed with: CRNA and Surgeon  Anesthesia Plan Comments:         Anesthesia Quick Evaluation

## 2019-02-06 ENCOUNTER — Encounter (HOSPITAL_COMMUNITY): Payer: Self-pay | Admitting: Surgery

## 2019-02-06 DIAGNOSIS — Z992 Dependence on renal dialysis: Secondary | ICD-10-CM | POA: Diagnosis not present

## 2019-02-06 DIAGNOSIS — D631 Anemia in chronic kidney disease: Secondary | ICD-10-CM | POA: Diagnosis not present

## 2019-02-06 DIAGNOSIS — N2581 Secondary hyperparathyroidism of renal origin: Secondary | ICD-10-CM | POA: Diagnosis not present

## 2019-02-06 DIAGNOSIS — N186 End stage renal disease: Secondary | ICD-10-CM | POA: Diagnosis not present

## 2019-02-06 DIAGNOSIS — D509 Iron deficiency anemia, unspecified: Secondary | ICD-10-CM | POA: Diagnosis not present

## 2019-02-09 DIAGNOSIS — N2581 Secondary hyperparathyroidism of renal origin: Secondary | ICD-10-CM | POA: Diagnosis not present

## 2019-02-09 DIAGNOSIS — D631 Anemia in chronic kidney disease: Secondary | ICD-10-CM | POA: Diagnosis not present

## 2019-02-09 DIAGNOSIS — Z992 Dependence on renal dialysis: Secondary | ICD-10-CM | POA: Diagnosis not present

## 2019-02-09 DIAGNOSIS — N186 End stage renal disease: Secondary | ICD-10-CM | POA: Diagnosis not present

## 2019-02-09 DIAGNOSIS — D509 Iron deficiency anemia, unspecified: Secondary | ICD-10-CM | POA: Diagnosis not present

## 2019-02-10 DIAGNOSIS — Z992 Dependence on renal dialysis: Secondary | ICD-10-CM | POA: Diagnosis not present

## 2019-02-10 DIAGNOSIS — N186 End stage renal disease: Secondary | ICD-10-CM | POA: Diagnosis not present

## 2019-02-11 DIAGNOSIS — D631 Anemia in chronic kidney disease: Secondary | ICD-10-CM | POA: Diagnosis not present

## 2019-02-11 DIAGNOSIS — D509 Iron deficiency anemia, unspecified: Secondary | ICD-10-CM | POA: Diagnosis not present

## 2019-02-11 DIAGNOSIS — Z992 Dependence on renal dialysis: Secondary | ICD-10-CM | POA: Diagnosis not present

## 2019-02-11 DIAGNOSIS — N2581 Secondary hyperparathyroidism of renal origin: Secondary | ICD-10-CM | POA: Diagnosis not present

## 2019-02-11 DIAGNOSIS — N186 End stage renal disease: Secondary | ICD-10-CM | POA: Diagnosis not present

## 2019-02-16 DIAGNOSIS — Z992 Dependence on renal dialysis: Secondary | ICD-10-CM | POA: Diagnosis not present

## 2019-02-16 DIAGNOSIS — N186 End stage renal disease: Secondary | ICD-10-CM | POA: Diagnosis not present

## 2019-02-16 DIAGNOSIS — N2581 Secondary hyperparathyroidism of renal origin: Secondary | ICD-10-CM | POA: Diagnosis not present

## 2019-02-16 DIAGNOSIS — D631 Anemia in chronic kidney disease: Secondary | ICD-10-CM | POA: Diagnosis not present

## 2019-02-16 DIAGNOSIS — D509 Iron deficiency anemia, unspecified: Secondary | ICD-10-CM | POA: Diagnosis not present

## 2019-02-17 DIAGNOSIS — N189 Chronic kidney disease, unspecified: Secondary | ICD-10-CM | POA: Diagnosis not present

## 2019-02-17 DIAGNOSIS — I10 Essential (primary) hypertension: Secondary | ICD-10-CM | POA: Diagnosis not present

## 2019-02-17 DIAGNOSIS — E1065 Type 1 diabetes mellitus with hyperglycemia: Secondary | ICD-10-CM | POA: Diagnosis not present

## 2019-02-17 DIAGNOSIS — Z299 Encounter for prophylactic measures, unspecified: Secondary | ICD-10-CM | POA: Diagnosis not present

## 2019-02-18 DIAGNOSIS — N2581 Secondary hyperparathyroidism of renal origin: Secondary | ICD-10-CM | POA: Diagnosis not present

## 2019-02-18 DIAGNOSIS — N186 End stage renal disease: Secondary | ICD-10-CM | POA: Diagnosis not present

## 2019-02-18 DIAGNOSIS — D631 Anemia in chronic kidney disease: Secondary | ICD-10-CM | POA: Diagnosis not present

## 2019-02-18 DIAGNOSIS — D509 Iron deficiency anemia, unspecified: Secondary | ICD-10-CM | POA: Diagnosis not present

## 2019-02-18 DIAGNOSIS — Z992 Dependence on renal dialysis: Secondary | ICD-10-CM | POA: Diagnosis not present

## 2019-02-20 DIAGNOSIS — N186 End stage renal disease: Secondary | ICD-10-CM | POA: Diagnosis not present

## 2019-02-20 DIAGNOSIS — D509 Iron deficiency anemia, unspecified: Secondary | ICD-10-CM | POA: Diagnosis not present

## 2019-02-20 DIAGNOSIS — N2581 Secondary hyperparathyroidism of renal origin: Secondary | ICD-10-CM | POA: Diagnosis not present

## 2019-02-20 DIAGNOSIS — Z992 Dependence on renal dialysis: Secondary | ICD-10-CM | POA: Diagnosis not present

## 2019-02-20 DIAGNOSIS — D631 Anemia in chronic kidney disease: Secondary | ICD-10-CM | POA: Diagnosis not present

## 2019-02-23 DIAGNOSIS — N186 End stage renal disease: Secondary | ICD-10-CM | POA: Diagnosis not present

## 2019-02-23 DIAGNOSIS — D509 Iron deficiency anemia, unspecified: Secondary | ICD-10-CM | POA: Diagnosis not present

## 2019-02-23 DIAGNOSIS — D631 Anemia in chronic kidney disease: Secondary | ICD-10-CM | POA: Diagnosis not present

## 2019-02-23 DIAGNOSIS — Z992 Dependence on renal dialysis: Secondary | ICD-10-CM | POA: Diagnosis not present

## 2019-02-23 DIAGNOSIS — N2581 Secondary hyperparathyroidism of renal origin: Secondary | ICD-10-CM | POA: Diagnosis not present

## 2019-02-25 DIAGNOSIS — Z992 Dependence on renal dialysis: Secondary | ICD-10-CM | POA: Diagnosis not present

## 2019-02-25 DIAGNOSIS — N2581 Secondary hyperparathyroidism of renal origin: Secondary | ICD-10-CM | POA: Diagnosis not present

## 2019-02-25 DIAGNOSIS — N186 End stage renal disease: Secondary | ICD-10-CM | POA: Diagnosis not present

## 2019-02-25 DIAGNOSIS — D631 Anemia in chronic kidney disease: Secondary | ICD-10-CM | POA: Diagnosis not present

## 2019-02-25 DIAGNOSIS — D509 Iron deficiency anemia, unspecified: Secondary | ICD-10-CM | POA: Diagnosis not present

## 2019-02-27 DIAGNOSIS — D631 Anemia in chronic kidney disease: Secondary | ICD-10-CM | POA: Diagnosis not present

## 2019-02-27 DIAGNOSIS — Z992 Dependence on renal dialysis: Secondary | ICD-10-CM | POA: Diagnosis not present

## 2019-02-27 DIAGNOSIS — D509 Iron deficiency anemia, unspecified: Secondary | ICD-10-CM | POA: Diagnosis not present

## 2019-02-27 DIAGNOSIS — N186 End stage renal disease: Secondary | ICD-10-CM | POA: Diagnosis not present

## 2019-02-27 DIAGNOSIS — N2581 Secondary hyperparathyroidism of renal origin: Secondary | ICD-10-CM | POA: Diagnosis not present

## 2019-03-02 DIAGNOSIS — D631 Anemia in chronic kidney disease: Secondary | ICD-10-CM | POA: Diagnosis not present

## 2019-03-02 DIAGNOSIS — N2581 Secondary hyperparathyroidism of renal origin: Secondary | ICD-10-CM | POA: Diagnosis not present

## 2019-03-02 DIAGNOSIS — N186 End stage renal disease: Secondary | ICD-10-CM | POA: Diagnosis not present

## 2019-03-02 DIAGNOSIS — Z992 Dependence on renal dialysis: Secondary | ICD-10-CM | POA: Diagnosis not present

## 2019-03-02 DIAGNOSIS — D509 Iron deficiency anemia, unspecified: Secondary | ICD-10-CM | POA: Diagnosis not present

## 2019-03-04 DIAGNOSIS — D631 Anemia in chronic kidney disease: Secondary | ICD-10-CM | POA: Diagnosis not present

## 2019-03-04 DIAGNOSIS — Z992 Dependence on renal dialysis: Secondary | ICD-10-CM | POA: Diagnosis not present

## 2019-03-04 DIAGNOSIS — D509 Iron deficiency anemia, unspecified: Secondary | ICD-10-CM | POA: Diagnosis not present

## 2019-03-04 DIAGNOSIS — N186 End stage renal disease: Secondary | ICD-10-CM | POA: Diagnosis not present

## 2019-03-04 DIAGNOSIS — N2581 Secondary hyperparathyroidism of renal origin: Secondary | ICD-10-CM | POA: Diagnosis not present

## 2019-03-06 DIAGNOSIS — N186 End stage renal disease: Secondary | ICD-10-CM | POA: Diagnosis not present

## 2019-03-06 DIAGNOSIS — D631 Anemia in chronic kidney disease: Secondary | ICD-10-CM | POA: Diagnosis not present

## 2019-03-06 DIAGNOSIS — D509 Iron deficiency anemia, unspecified: Secondary | ICD-10-CM | POA: Diagnosis not present

## 2019-03-06 DIAGNOSIS — N2581 Secondary hyperparathyroidism of renal origin: Secondary | ICD-10-CM | POA: Diagnosis not present

## 2019-03-06 DIAGNOSIS — Z992 Dependence on renal dialysis: Secondary | ICD-10-CM | POA: Diagnosis not present

## 2019-03-09 DIAGNOSIS — D509 Iron deficiency anemia, unspecified: Secondary | ICD-10-CM | POA: Diagnosis not present

## 2019-03-09 DIAGNOSIS — Z992 Dependence on renal dialysis: Secondary | ICD-10-CM | POA: Diagnosis not present

## 2019-03-09 DIAGNOSIS — D631 Anemia in chronic kidney disease: Secondary | ICD-10-CM | POA: Diagnosis not present

## 2019-03-09 DIAGNOSIS — N186 End stage renal disease: Secondary | ICD-10-CM | POA: Diagnosis not present

## 2019-03-09 DIAGNOSIS — N2581 Secondary hyperparathyroidism of renal origin: Secondary | ICD-10-CM | POA: Diagnosis not present

## 2019-03-11 DIAGNOSIS — N2581 Secondary hyperparathyroidism of renal origin: Secondary | ICD-10-CM | POA: Diagnosis not present

## 2019-03-11 DIAGNOSIS — D509 Iron deficiency anemia, unspecified: Secondary | ICD-10-CM | POA: Diagnosis not present

## 2019-03-11 DIAGNOSIS — D631 Anemia in chronic kidney disease: Secondary | ICD-10-CM | POA: Diagnosis not present

## 2019-03-11 DIAGNOSIS — N186 End stage renal disease: Secondary | ICD-10-CM | POA: Diagnosis not present

## 2019-03-11 DIAGNOSIS — Z992 Dependence on renal dialysis: Secondary | ICD-10-CM | POA: Diagnosis not present

## 2019-03-13 ENCOUNTER — Other Ambulatory Visit: Payer: Self-pay | Admitting: Surgery

## 2019-03-13 ENCOUNTER — Telehealth (HOSPITAL_COMMUNITY): Payer: Self-pay | Admitting: *Deleted

## 2019-03-13 DIAGNOSIS — D631 Anemia in chronic kidney disease: Secondary | ICD-10-CM | POA: Diagnosis not present

## 2019-03-13 DIAGNOSIS — N2581 Secondary hyperparathyroidism of renal origin: Secondary | ICD-10-CM | POA: Diagnosis not present

## 2019-03-13 DIAGNOSIS — Z992 Dependence on renal dialysis: Secondary | ICD-10-CM

## 2019-03-13 DIAGNOSIS — D509 Iron deficiency anemia, unspecified: Secondary | ICD-10-CM | POA: Diagnosis not present

## 2019-03-13 DIAGNOSIS — N186 End stage renal disease: Secondary | ICD-10-CM | POA: Diagnosis not present

## 2019-03-13 NOTE — Telephone Encounter (Signed)
The above patient or their representative was contacted and gave the following answers to these questions:         Do you have any of the following symptoms?n  Fever                    Cough                   Shortness of breath  Do  you have any of the following other symptoms? n   muscle pain         vomiting,        diarrhea        rash         weakness        red eye        abdominal pain         bruising          bruising or bleeding              joint pain           severe headache    Have you been in contact with someone who was or has been sick in the past 2 weeks?n  Yes                 Unsure                         Unable to assess   Does the person that you were in contact with have any of the following symptoms?   Cough         shortness of breath           muscle pain         vomiting,            diarrhea            rash            weakness           fever            red eye           abdominal pain           bruising  or  bleeding                joint pain                severe headache               Have you  or someone you have been in contact with traveled internationally in th last month?         If yes, which countries?   Have you  or someone you have been in contact with traveled outside [place] in th last month?         If yes, which state and city?   COMMENTS OR ACTION PLAN FOR THIS PATIENT:         

## 2019-03-13 NOTE — Telephone Encounter (Signed)
The above patient or their representative was contacted and gave the following answers to these questions:         Do you have any of the following symptoms?n  Fever                    Cough                   Shortness of breath  Do  you have any of the following other symptoms? n   muscle pain         vomiting,        diarrhea        rash         weakness        red eye        abdominal pain         bruising          bruising or bleeding              joint pain           severe headache    Have you been in contact with someone who was or has been sick in the past 2 weeks?n  Yes                 Unsure                         Unable to assess   Does the person that you were in contact with have any of the following symptoms?   Cough         shortness of breath           muscle pain         vomiting,            diarrhea            rash            weakness           fever            red eye           abdominal pain           bruising  or  bleeding                joint pain                severe headache               Have you  or someone you have been in contact with traveled internationally in th last month?   n      If yes, which countries?   Have you  or someone you have been in contact with traveled outside [place] in th last month?         If yes, which state and city?   COMMENTS OR ACTION PLAN FOR THIS PATIENT:

## 2019-03-16 ENCOUNTER — Ambulatory Visit (HOSPITAL_COMMUNITY)
Admission: RE | Admit: 2019-03-16 | Discharge: 2019-03-16 | Disposition: A | Discharge status: Home / Self Care | Payer: Medicare Other | Source: Ambulatory Visit | Attending: Surgery | Admitting: Surgery

## 2019-03-16 ENCOUNTER — Other Ambulatory Visit: Payer: Self-pay

## 2019-03-16 ENCOUNTER — Ambulatory Visit (INDEPENDENT_AMBULATORY_CARE_PROVIDER_SITE_OTHER): Payer: Medicare Other | Admitting: Surgery

## 2019-03-16 ENCOUNTER — Encounter: Payer: Self-pay | Admitting: *Deleted

## 2019-03-16 ENCOUNTER — Encounter: Payer: Self-pay | Admitting: Surgery

## 2019-03-16 ENCOUNTER — Other Ambulatory Visit: Payer: Self-pay | Admitting: *Deleted

## 2019-03-16 VITALS — BP 132/70 | HR 90 | Temp 97.3°F | Resp 20 | Ht 67.0 in | Wt 206.0 lb

## 2019-03-16 DIAGNOSIS — D631 Anemia in chronic kidney disease: Secondary | ICD-10-CM | POA: Diagnosis not present

## 2019-03-16 DIAGNOSIS — Z992 Dependence on renal dialysis: Secondary | ICD-10-CM | POA: Insufficient documentation

## 2019-03-16 DIAGNOSIS — N186 End stage renal disease: Secondary | ICD-10-CM

## 2019-03-16 DIAGNOSIS — N2581 Secondary hyperparathyroidism of renal origin: Secondary | ICD-10-CM | POA: Diagnosis not present

## 2019-03-16 DIAGNOSIS — D509 Iron deficiency anemia, unspecified: Secondary | ICD-10-CM | POA: Diagnosis not present

## 2019-03-16 MED ORDER — HYDROCODONE-ACETAMINOPHEN 5-325 MG PO TABS: 1.0000 | ORAL_TABLET | ORAL | 0 refills | Status: DC | PRN

## 2019-03-16 NOTE — Progress Notes (Signed)
Patient name: Alejandro Lee MRN: [id number] DOB: 10/05/1960 Sex: male  REASON FOR VISIT:     post op  HISTORY OF PRESENT ILLNESS:   Alejandro Lee is a 58 y.o. male status post left BCF on 02-05-2019.  He is on HD via a right arm fistula with known central vein occlusion.    CURRENT MEDICATIONS:    Current Outpatient Medications  Medication Sig Dispense Refill  . acetaminophen (TYLENOL) 500 MG tablet Take 1,000 mg by mouth every 6 (six) hours as needed for moderate pain.    Marland Kitchen amLODipine (NORVASC) 5 MG tablet Take 1 tablet (5 mg total) by mouth daily. 30 tablet 2  . atorvastatin (LIPITOR) 20 MG tablet Take 20 mg by mouth daily.    . calcium acetate (PHOSLO) 667 MG capsule Take 2,001 mg by mouth 3 (three) times daily with meals.     Marland Kitchen HYDROcodone-acetaminophen (NORCO) 5-325 MG tablet Take 1 tablet by mouth every 6 (six) hours as needed for moderate pain. 10 tablet 0  . lidocaine-prilocaine (EMLA) cream Apply 1 application topically as needed (port acces).    Marland Kitchen loperamide (IMODIUM A-D) 2 MG tablet Take 2 mg by mouth 4 (four) times daily as needed for diarrhea or loose stools.    . sorbitol 70 % solution Take 15 mLs by mouth daily as needed (constipation).     Marland Kitchen VITAMIN D PO Take 1 capsule by mouth 4 (four) times a week. Tues, Thurs, Sat, and Sun (non-dialysis days)     No current facility-administered medications for this visit.     REVIEW OF SYSTEMS:   [X]  denotes positive finding, [ ]  denotes negative finding Cardiac  Comments:  Chest pain or chest pressure:    Shortness of breath upon exertion:    Short of breath when lying flat:    Irregular heart rhythm:    Constitutional    Fever or chills:      PHYSICAL EXAM:   Vitals:   03/16/19 1513  BP: 132/70  Pulse: 90  Resp: 20  Temp: (!) 97.3 F (36.3 C)  SpO2: 98%  Weight: 93.4 kg  Height: 5\' 7"  (1.702 m)    GENERAL: The patient is a well-nourished male, in no acute distress. The vital  signs are documented above. CARDIOVASCULAR: There is a regular rate and rhythm. PULMONARY: Non-labored respirations Palpable thrill in fistula on left arm swollen right arm with chest collaterals   STUDIES:   +------------+---------+-------------+----------+-----------------------------+ OUTFLOW VEIN   PSV   Diameter (cm)Depth (cm)          Describe                         (cm/s)                                                       +------------+---------+-------------+----------+-----------------------------+ Shoulder       161       0.61        1.08                                 +------------+---------+-------------+----------+-----------------------------+ Prox UA        163       0.52  0.57         competing branch        +------------+---------+-------------+----------+-----------------------------+ Mid UA         129       0.40        0.29    competing branch and mid to                                                           dst              +------------+---------+-------------+----------+-----------------------------+ Dist UA        220       0.22        0.35       narrowing of outflow      +------------+---------+-------------+----------+-----------------------------+ AC Fossa    662 / 237 0.23 / 0.76   1.27 /                                                                     0.22                                 +------------+---------+-------------+----------+-----------------------------+    MEDICAL ISSUES:   Non-maturing left brachiocephalic fistula: I reimaged the fistula with the SonoSite and felt that the vein was of adequate caliber for utilization however there was a large branch beginning around the antecubital crease and joining in the mid upper arm.  I think the next step should be branch ligation.  I may consider trying to pass a dilator across the anastomosis to make sure that there is no proximal  stenosis.  This is been scheduled for Thursday, August 6.  The patient is having significant swelling and discomfort in his right arm from his central vein occlusion and patent right arm fistula.  I gave him hydrocodone to help with the pain.  He will need his fistula ligated once his left arm fistula has matured.  Leia Alf, MD, FACS [facility] [phone] Pager [phone]

## 2019-03-16 NOTE — H&P (View-Only) (Signed)
Patient name: Alejandro Lee MRN: [id number] DOB: September 17, 1960 Sex: male  REASON FOR VISIT:     post op  HISTORY OF PRESENT ILLNESS:   Alejandro Lee is a 58 y.o. male status post left BCF on 02-05-2019.  He is on HD via a right arm fistula with known central vein occlusion.    CURRENT MEDICATIONS:    Current Outpatient Medications  Medication Sig Dispense Refill  . acetaminophen (TYLENOL) 500 MG tablet Take 1,000 mg by mouth every 6 (six) hours as needed for moderate pain.    Marland Kitchen amLODipine (NORVASC) 5 MG tablet Take 1 tablet (5 mg total) by mouth daily. 30 tablet 2  . atorvastatin (LIPITOR) 20 MG tablet Take 20 mg by mouth daily.    . calcium acetate (PHOSLO) 667 MG capsule Take 2,001 mg by mouth 3 (three) times daily with meals.     Marland Kitchen HYDROcodone-acetaminophen (NORCO) 5-325 MG tablet Take 1 tablet by mouth every 6 (six) hours as needed for moderate pain. 10 tablet 0  . lidocaine-prilocaine (EMLA) cream Apply 1 application topically as needed (port acces).    Marland Kitchen loperamide (IMODIUM A-D) 2 MG tablet Take 2 mg by mouth 4 (four) times daily as needed for diarrhea or loose stools.    . sorbitol 70 % solution Take 15 mLs by mouth daily as needed (constipation).     Marland Kitchen VITAMIN D PO Take 1 capsule by mouth 4 (four) times a week. Tues, Thurs, Sat, and Sun (non-dialysis days)     No current facility-administered medications for this visit.     REVIEW OF SYSTEMS:   [X]  denotes positive finding, [ ]  denotes negative finding Cardiac  Comments:  Chest pain or chest pressure:    Shortness of breath upon exertion:    Short of breath when lying flat:    Irregular heart rhythm:    Constitutional    Fever or chills:      PHYSICAL EXAM:   Vitals:   03/16/19 1513  BP: 132/70  Pulse: 90  Resp: 20  Temp: (!) 97.3 F (36.3 C)  SpO2: 98%  Weight: 93.4 kg  Height: 5\' 7"  (1.702 m)    GENERAL: The patient is a well-nourished male, in no acute distress. The vital  signs are documented above. CARDIOVASCULAR: There is a regular rate and rhythm. PULMONARY: Non-labored respirations Palpable thrill in fistula on left arm swollen right arm with chest collaterals   STUDIES:   +------------+---------+-------------+----------+-----------------------------+ OUTFLOW VEIN   PSV   Diameter (cm)Depth (cm)          Describe                         (cm/s)                                                       +------------+---------+-------------+----------+-----------------------------+ Shoulder       161       0.61        1.08                                 +------------+---------+-------------+----------+-----------------------------+ Prox UA        163       0.52  0.57         competing branch        +------------+---------+-------------+----------+-----------------------------+ Mid UA         129       0.40        0.29    competing branch and mid to                                                           dst              +------------+---------+-------------+----------+-----------------------------+ Dist UA        220       0.22        0.35       narrowing of outflow      +------------+---------+-------------+----------+-----------------------------+ AC Fossa    662 / 237 0.23 / 0.76   1.27 /                                                                     0.22                                 +------------+---------+-------------+----------+-----------------------------+    MEDICAL ISSUES:   Non-maturing left brachiocephalic fistula: I reimaged the fistula with the SonoSite and felt that the vein was of adequate caliber for utilization however there was a large branch beginning around the antecubital crease and joining in the mid upper arm.  I think the next step should be branch ligation.  I may consider trying to pass a dilator across the anastomosis to make sure that there is no proximal  stenosis.  This is been scheduled for Thursday, August 6.  The patient is having significant swelling and discomfort in his right arm from his central vein occlusion and patent right arm fistula.  I gave him hydrocodone to help with the pain.  He will need his fistula ligated once his left arm fistula has matured.  Leia Alf, MD, FACS [facility] [phone] Pager [phone]

## 2019-03-17 ENCOUNTER — Other Ambulatory Visit (HOSPITAL_COMMUNITY)
Admission: RE | Admit: 2019-03-17 | Discharge: 2019-03-17 | Disposition: A | Discharge status: Home / Self Care | Payer: Medicare Other | Source: Ambulatory Visit | Attending: Surgery | Admitting: Surgery

## 2019-03-17 ENCOUNTER — Other Ambulatory Visit: Payer: Self-pay

## 2019-03-17 ENCOUNTER — Telehealth: Payer: Self-pay | Admitting: *Deleted

## 2019-03-17 ENCOUNTER — Encounter (HOSPITAL_COMMUNITY): Payer: Self-pay | Admitting: *Deleted

## 2019-03-17 DIAGNOSIS — Z01812 Encounter for preprocedural laboratory examination: Secondary | ICD-10-CM | POA: Insufficient documentation

## 2019-03-17 DIAGNOSIS — Z20828 Contact with and (suspected) exposure to other viral communicable diseases: Secondary | ICD-10-CM | POA: Diagnosis not present

## 2019-03-17 LAB — SARS CORONAVIRUS 2 (TAT 6-24 HRS): SARS Coronavirus 2: NEGATIVE

## 2019-03-17 NOTE — Telephone Encounter (Signed)
Call and spoke with patient's wife. Fax confirmed received at [facility] as well as message left on voice mail regarding transportation of 03/19/2019 procedure.

## 2019-03-17 NOTE — Progress Notes (Signed)
Spoke with pt for pre-op call. Pt states he's not had any recent chest pain or sob. Was told years ago that an EKG showed a heart attack, denies being under the care of a cardiologist. Pt is a type 2 diabetic. States last A1C was 7.0 two weeks ago at his PCP. States he does not check his blood sugar at home, nor does he have a meter. Pt is on no medications for diabetes.  Pt had Covid test done today. He states he understands to quarantine.    Coronavirus Screening  Have you experienced the following symptoms:  Cough NO Fever (>100.83F)  NO Runny nose  NO Sore throat NO Difficulty breathing/shortness of breath  NO  Have you or a family member traveled in the last 14 days and where? NO  Patient reminded that hospital visitation restrictions are in effect and the importance of the restrictions. Informed pt that he may have 1 visitor to wait in the waiting room while he is in pre-op, surgery and PACU. Pt voiced understanding.

## 2019-03-18 ENCOUNTER — Emergency Department (HOSPITAL_COMMUNITY): Payer: Medicare Other

## 2019-03-18 ENCOUNTER — Encounter (HOSPITAL_COMMUNITY): Payer: Self-pay | Admitting: Emergency Medicine

## 2019-03-18 ENCOUNTER — Other Ambulatory Visit: Payer: Self-pay

## 2019-03-18 ENCOUNTER — Emergency Department (HOSPITAL_COMMUNITY)
Admission: EM | Admit: 2019-03-18 | Discharge: 2019-03-18 | Disposition: A | Discharge status: Home / Self Care | Payer: Medicare Other | Attending: Emergency Medicine | Admitting: Emergency Medicine

## 2019-03-18 DIAGNOSIS — Z7984 Long term (current) use of oral hypoglycemic drugs: Secondary | ICD-10-CM | POA: Diagnosis not present

## 2019-03-18 DIAGNOSIS — I10 Essential (primary) hypertension: Secondary | ICD-10-CM | POA: Diagnosis not present

## 2019-03-18 DIAGNOSIS — Z87891 Personal history of nicotine dependence: Secondary | ICD-10-CM | POA: Diagnosis not present

## 2019-03-18 DIAGNOSIS — R05 Cough: Secondary | ICD-10-CM | POA: Diagnosis not present

## 2019-03-18 DIAGNOSIS — D509 Iron deficiency anemia, unspecified: Secondary | ICD-10-CM | POA: Diagnosis not present

## 2019-03-18 DIAGNOSIS — N186 End stage renal disease: Secondary | ICD-10-CM

## 2019-03-18 DIAGNOSIS — I120 Hypertensive chronic kidney disease with stage 5 chronic kidney disease or end stage renal disease: Secondary | ICD-10-CM | POA: Diagnosis not present

## 2019-03-18 DIAGNOSIS — R002 Palpitations: Secondary | ICD-10-CM | POA: Diagnosis not present

## 2019-03-18 DIAGNOSIS — Z209 Contact with and (suspected) exposure to unspecified communicable disease: Secondary | ICD-10-CM | POA: Diagnosis not present

## 2019-03-18 DIAGNOSIS — D631 Anemia in chronic kidney disease: Secondary | ICD-10-CM | POA: Diagnosis not present

## 2019-03-18 DIAGNOSIS — E1122 Type 2 diabetes mellitus with diabetic chronic kidney disease: Secondary | ICD-10-CM | POA: Diagnosis not present

## 2019-03-18 DIAGNOSIS — N2581 Secondary hyperparathyroidism of renal origin: Secondary | ICD-10-CM | POA: Diagnosis not present

## 2019-03-18 DIAGNOSIS — Z992 Dependence on renal dialysis: Secondary | ICD-10-CM | POA: Diagnosis not present

## 2019-03-18 DIAGNOSIS — Z79899 Other long term (current) drug therapy: Secondary | ICD-10-CM | POA: Insufficient documentation

## 2019-03-18 DIAGNOSIS — J90 Pleural effusion, not elsewhere classified: Secondary | ICD-10-CM | POA: Diagnosis not present

## 2019-03-18 DIAGNOSIS — I493 Ventricular premature depolarization: Secondary | ICD-10-CM

## 2019-03-18 DIAGNOSIS — I5032 Chronic diastolic (congestive) heart failure: Secondary | ICD-10-CM | POA: Insufficient documentation

## 2019-03-18 LAB — CBC WITH DIFFERENTIAL/PLATELET
Abs Immature Granulocytes: 0.01 10*3/uL (ref 0.00–0.07)
Basophils Absolute: 0 10*3/uL (ref 0.0–0.1)
Basophils Relative: 0 %
Eosinophils Absolute: 0.2 10*3/uL (ref 0.0–0.5)
Eosinophils Relative: 3 %
HCT: 34.2 % — ABNORMAL LOW (ref 39.0–52.0)
Hemoglobin: 10.4 g/dL — ABNORMAL LOW (ref 13.0–17.0)
Immature Granulocytes: 0 %
Lymphocytes Relative: 29 %
Lymphs Abs: 1.5 10*3/uL (ref 0.7–4.0)
MCH: 25.8 pg — ABNORMAL LOW (ref 26.0–34.0)
MCHC: 30.4 g/dL (ref 30.0–36.0)
MCV: 84.9 fL (ref 80.0–100.0)
Monocytes Absolute: 0.4 10*3/uL (ref 0.1–1.0)
Monocytes Relative: 8 %
Neutro Abs: 3 10*3/uL (ref 1.7–7.7)
Neutrophils Relative %: 60 %
Platelets: 159 10*3/uL (ref 150–400)
RBC: 4.03 MIL/uL — ABNORMAL LOW (ref 4.22–5.81)
RDW: 15.8 % — ABNORMAL HIGH (ref 11.5–15.5)
WBC: 5.1 10*3/uL (ref 4.0–10.5)
nRBC: 0 % (ref 0.0–0.2)

## 2019-03-18 LAB — BASIC METABOLIC PANEL
Anion gap: 16 — ABNORMAL HIGH (ref 5–15)
BUN: 51 mg/dL — ABNORMAL HIGH (ref 6–20)
CO2: 27 mmol/L (ref 22–32)
Calcium: 9.6 mg/dL (ref 8.9–10.3)
Chloride: 97 mmol/L — ABNORMAL LOW (ref 98–111)
Creatinine, Ser: 11.98 mg/dL — ABNORMAL HIGH (ref 0.61–1.24)
GFR calc Af Amer: 5 mL/min — ABNORMAL LOW (ref >60)
GFR calc non Af Amer: 4 mL/min — ABNORMAL LOW (ref >60)
Glucose, Bld: 138 mg/dL — ABNORMAL HIGH (ref 70–99)
Potassium: 4.4 mmol/L (ref 3.5–5.1)
Sodium: 140 mmol/L (ref 135–145)

## 2019-03-18 LAB — TROPONIN I (HIGH SENSITIVITY)
Troponin I (High Sensitivity): 19 ng/L — ABNORMAL HIGH (ref <18)
Troponin I (High Sensitivity): <2 ng/L (ref <18)

## 2019-03-18 LAB — MAGNESIUM: Magnesium: 2.3 mg/dL (ref 1.7–2.4)

## 2019-03-18 MED ORDER — AMLODIPINE BESYLATE 5 MG PO TABS
5.0000 mg | ORAL_TABLET | Freq: Once | ORAL | Status: AC
  Administered 2019-03-18: 14:00:00 5 mg via ORAL
  Filled 2019-03-18: qty 1

## 2019-03-18 NOTE — Discharge Instructions (Addendum)
Take your usual prescriptions as previously directed.  Call your regular medical doctor today to schedule a follow up appointment within the next 3 days. Call the Cardiologist today to schedule a follow up appointment within the week.  Return to the Emergency Department immediately sooner if worsening.

## 2019-03-18 NOTE — ED Provider Notes (Signed)
[facility] EMERGENCY DEPARTMENT Provider Note   CSN: [id number] Arrival date & time: 03/18/19  0741     History   Chief Complaint Chief Complaint  Patient presents with   Palpitations    HPI Alejandro Lee is a 58 y.o. male.     HPI  Pt was seen at [place]. Per pt, c/o sudden onset and resolution of multiple very brief intermittent episodes of "heart pounds then stops and starts again" since this morning PTA. States his symptoms last for a second or 2 before completely resolving. Denies CP, no SOB/cough, no back pain, no abd pain, no N/V/D, no syncope/near syncope, no focal motor weakness, no fevers, no rash, no injury.     Past Medical History:  Diagnosis Date   Anemia    1 blood transfusion   Arthritis    Back pain    CHF (congestive heart failure) (HCC)    Chronic kidney disease    dialysis M/W/F   Diabetes mellitus without complication (HCC)    DVT (deep venous thrombosis) (HCC)    GERD (gastroesophageal reflux disease)    Headache    migraines   History of cardiovascular stress test 11/2017   [facility]) negative dobutamine stress test   Hyperlipidemia    Hypertension    Legally blind    right   Myocardial infarction [facility])    patient states it was seen on EKG, denies a cardiac cath    Patient Active Problem List   Diagnosis Date Noted   ESRD on hemodialysis ([place])    Hyperkalemia 06/25/2018   Non-compliance with renal dialysis ([place]) 06/25/2018   Generalized abdominal pain 06/25/2018   Chronic back pain 06/25/2018   GERD (gastroesophageal reflux disease) 06/25/2018   Chronic diastolic HF (heart failure) ([place]) 06/25/2018   Class 1 obesity due to excess calories in adult 06/25/2018   Benign essential HTN 06/25/2018   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[place]) 12/25/2017   Type 2 diabetes mellitus with other specified complication ([place]) 90/24/0973   ESRD (end stage renal disease) on dialysis ([place]) 12/19/2017   Chronic right-sided  low back pain without sciatica 12/19/2017   Pseudophakia of left eye 09/03/2017   PDR (proliferative diabetic retinopathy) ([place]) 09/03/2017   Cataract in degenerative disorder 09/03/2017   Unspecified complication of internal prosthetic device, implant and graft, initial encounter 05/17/2017   Rectal abscess 05/17/2017   Osteopathy in diseases classified elsewhere, unspecified site 53/29/9242   Metabolic disorder 68/34/1962   Hypertension 05/16/2017   Dependence on renal dialysis ([place]) 05/16/2017   Anemia of chronic renal failure, stage 5 ([place]) 05/16/2017   Retinal detachment, tractional, both eyes 06/19/2012    Past Surgical History:  Procedure Laterality Date   A/V FISTULAGRAM Right 07/22/2018   Procedure: A/V FISTULAGRAM;  Surgeon: Serafina Mitchell, MD;  Location: [place] CV LAB;  Service: Cardiovascular;  Laterality: Right;   AV FISTULA PLACEMENT Right    AV FISTULA PLACEMENT Right 22/97/9892   Procedure: PLICATION OF RIGHT arm FISTULA;  Surgeon: Waynetta Sandy, MD;  Location: [place];  Service: Vascular;  Laterality: Right;   [place] Left 02/05/2019   Procedure: BRACHIOCEPHALIC FISTULA  LEFT ARM;  Surgeon: Serafina Mitchell, MD;  Location: [facility] OR;  Service: Vascular;  Laterality: Left;   COLONOSCOPY     diabetic cyst removal Bilateral    on buttocks x8   EYE SURGERY     FISTULA SUPERFICIALIZATION Right 08/31/4172   Procedure: PLICATION OF ARTERIOVENOUS FISTULA RIGHT ARM;  Surgeon: Conrad Lakota, MD;  Location: [facility] OR;  Service: Vascular;  Laterality: Right;   lens replacement Left    PERIPHERAL VASCULAR BALLOON ANGIOPLASTY  07/22/2018   Procedure: PERIPHERAL VASCULAR BALLOON ANGIOPLASTY;  Surgeon: Serafina Mitchell, MD;  Location: [place] CV LAB;  Service: Cardiovascular;;  Right [place] fistula    PERIPHERAL VASCULAR BALLOON ANGIOPLASTY Right 02/03/2019   Procedure: PERIPHERAL VASCULAR BALLOON ANGIOPLASTY;  Surgeon: Serafina Mitchell, MD;  Location: [place] CV LAB;  Service: Cardiovascular;  Laterality: Right;  RUE AVF   REFRACTIVE SURGERY Bilateral         Home Medications    Prior to Admission medications   Medication Sig Start Date End Date Taking? Authorizing Provider  acetaminophen (TYLENOL) 500 MG tablet Take 1,000 mg by mouth every 6 (six) hours as needed for moderate pain.    [provider]  amLODipine (NORVASC) 5 MG tablet Take 1 tablet (5 mg total) by mouth daily. 06/26/18   Barton Dubois, MD  calcium acetate (PHOSLO) 667 MG capsule Take 2,001 mg by mouth 3 (three) times daily with meals.     [provider]  HYDROcodone-acetaminophen (NORCO/VICODIN) 5-325 MG tablet Take 1 tablet by mouth every 4 (four) hours as needed for moderate pain. 03/16/19 03/15/20  Serafina Mitchell, MD  lidocaine-prilocaine (EMLA) cream Apply 1 application topically as needed (port acces).    [provider]  loperamide (IMODIUM A-D) 2 MG tablet Take 2 mg by mouth 4 (four) times daily as needed for diarrhea or loose stools.    [provider]  sitaGLIPtin (JANUVIA) 25 MG tablet Take 25 mg by mouth daily.    [provider]  sorbitol 70 % solution Take 15 mLs by mouth daily as needed (constipation).     [provider]  VITAMIN D PO Take 1 capsule by mouth 4 (four) times a week. Tues, Thurs, Sat, and Sun (non-dialysis days)    [provider]    Family History Family History  Problem Relation Age of Onset   Diabetes Mother    Hypertension Mother    Diabetes Father    Heart disease Father    Cancer Father    Hypertension Father    Diabetes Sister    Hypertension Sister    Diabetes Brother    Hypertension Brother    Cancer Brother     Social History Social History   Tobacco Use   Smoking status: Former Smoker    Types: Cigarettes   Smokeless tobacco: Never Used  Substance Use Topics   Alcohol use: Not Currently   Drug use: Never       Allergies   Patient has no known allergies.   Review of Systems Review of Systems ROS: Statement: All systems negative except as marked or noted in the HPI; Constitutional: Negative for fever and chills. ; ; Eyes: Negative for eye pain, redness and discharge. ; ; ENMT: Negative for ear pain, hoarseness, nasal congestion, sinus pressure and sore throat. ; ; Cardiovascular: +"heart stops for a second." Negative for chest pain, palpitations, diaphoresis, dyspnea and peripheral edema. ; ; Respiratory: Negative for cough, wheezing and stridor. ; ; Gastrointestinal: Negative for nausea, vomiting, diarrhea, abdominal pain, blood in stool, hematemesis, jaundice and rectal bleeding. . ; ; Genitourinary: Negative for dysuria, flank pain and hematuria. ; ; Musculoskeletal: Negative for back pain and neck pain. Negative for swelling and trauma.; ; Skin: Negative for pruritus, rash, abrasions, blisters, bruising and skin lesion.; ;  Neuro: Negative for headache, lightheadedness and neck stiffness. Negative for weakness, altered level of consciousness, altered mental status, extremity weakness, paresthesias, involuntary movement, seizure and syncope.       Physical Exam Updated Vital Signs BP (!) 168/67 (BP Location: Right Leg)    Pulse 87    Temp 98.1 F (36.7 C) (Oral)    Resp 12    Ht 5\' 7"  (1.702 m)    Wt 93.4 kg    SpO2 99%    BMI 32.26 kg/m   Physical Exam 0825: Physical examination:  Nursing notes reviewed; Vital signs and O2 SAT reviewed;  Constitutional: Well developed, Well nourished, Well hydrated, In no acute distress; Head:  Normocephalic, atraumatic; Eyes: EOMI, PERRL, No scleral icterus; ENMT: Mouth and pharynx normal, Mucous membranes moist; Neck: Supple, Full range of motion, No lymphadenopathy; Cardiovascular: Regular rate and rhythm, No gallop. +PVC noted on monitor which correlates when pt has symptoms; Respiratory: Breath sounds clear & equal bilaterally, No wheezes.  Speaking full  sentences with ease, Normal respiratory effort/excursion; Chest: Nontender, Movement normal; Abdomen: Soft, Nontender, Nondistended, Normal bowel sounds; Genitourinary: No CVA tenderness; Extremities: Peripheral pulses normal, No tenderness, No edema, No calf edema or asymmetry.; Neuro: AA&Ox3, Major CN grossly intact.  Speech clear. No gross focal motor or sensory deficits in extremities.; Skin: Color normal, Warm, Dry.     ED Treatments / Results  Labs (all labs ordered are listed, but only abnormal results are displayed)   EKG EKG Interpretation  Date/Time:  Wednesday March 18 2019 07:56:10 EDT   #1 Ventricular Rate:  84 PR Interval:    QRS Duration: 96 QT Interval:  406 QTC Calculation: 480 R Axis:   -31 Text Interpretation:  Sinus rhythm Left axis deviation Borderline low voltage, extremity leads Borderline prolonged QT interval When compared with ECG of 06/25/2018 No significant change was found Confirmed by Francine Graven [phone]) on 03/18/2019 8:45:33 AM     EKG Interpretation  Date/Time:  Wednesday March 18 2019 11:16:49 EDT  #2 Ventricular Rate:  85 PR Interval:    QRS Duration: 96 QT Interval:  411 QTC Calculation: 489 R Axis:   -31 Text Interpretation:  Sinus rhythm Left axis deviation Borderline prolonged QT interval Since last tracing of earlier today No significant change was found Confirmed by Francine Graven [phone]) on 03/18/2019 11:54:12 AM          Radiology   Procedures Procedures (including critical care time)  Medications Ordered in ED Medications - No data to display   Initial Impression / Assessment and Plan / ED Course  I have reviewed the triage vital signs and the nursing notes.  Pertinent labs & imaging results that were available during my care of the patient were reviewed by me and considered in my medical decision making (see chart for details).     MDM Reviewed: previous chart, nursing note and vitals Reviewed previous:  labs and ECG Interpretation: labs, ECG and x-ray    Results for orders placed or performed during the hospital encounter of 03/18/19  Magnesium  Result Value Ref Range   Magnesium 2.3 1.7 - 2.4 mg/dL  Basic metabolic panel  Result Value Ref Range   Sodium 140 135 - 145 mmol/L   Potassium 4.4 3.5 - 5.1 mmol/L   Chloride 97 (L) 98 - 111 mmol/L   CO2 27 22 - 32 mmol/L   Glucose, Bld 138 (H) 70 - 99 mg/dL   BUN 51 (H) 6 - 20 mg/dL  Creatinine, Ser 11.98 (H) 0.61 - 1.24 mg/dL   Calcium 9.6 8.9 - 10.3 mg/dL   GFR calc non Af Amer 4 (L) >60 mL/min   GFR calc Af Amer 5 (L) >60 mL/min   Anion gap 16 (H) 5 - 15  CBC with Differential  Result Value Ref Range   WBC 5.1 4.0 - 10.5 K/uL   RBC 4.03 (L) 4.22 - 5.81 MIL/uL   Hemoglobin 10.4 (L) 13.0 - 17.0 g/dL   HCT 34.2 (L) 39.0 - 52.0 %   MCV 84.9 80.0 - 100.0 fL   MCH 25.8 (L) 26.0 - 34.0 pg   MCHC 30.4 30.0 - 36.0 g/dL   RDW 15.8 (H) 11.5 - 15.5 %   Platelets 159 150 - 400 K/uL   nRBC 0.0 0.0 - 0.2 %   Neutrophils Relative % 60 %   Neutro Abs 3.0 1.7 - 7.7 K/uL   Lymphocytes Relative 29 %   Lymphs Abs 1.5 0.7 - 4.0 K/uL   Monocytes Relative 8 %   Monocytes Absolute 0.4 0.1 - 1.0 K/uL   Eosinophils Relative 3 %   Eosinophils Absolute 0.2 0.0 - 0.5 K/uL   Basophils Relative 0 %   Basophils Absolute 0.0 0.0 - 0.1 K/uL   Immature Granulocytes 0 %   Abs Immature Granulocytes 0.01 0.00 - 0.07 K/uL  Troponin I (High Sensitivity)  Result Value Ref Range   Troponin I (High Sensitivity) 19 (H) <18 ng/L  Troponin I (High Sensitivity)  Result Value Ref Range   Troponin I (High Sensitivity) <2.0 <18 ng/L   Dg Chest Port 1 View Result Date: 03/18/2019 CLINICAL DATA:  Palpitations, chest discomfort. EXAM: PORTABLE CHEST 1 VIEW COMPARISON:  Chest radiograph 10/12/2018 FINDINGS: The heart is at the upper limits of normal for size. Unchanged scarring in the left lung base. No focal consolidation within the lungs. No evidence of pleural  effusion or pneumothorax. No acute bony abnormality. IMPRESSION: No focal consolidation within the lungs. Heart at the upper limits of normal for size. Electronically Signed   By: Kellie Simmering   On: 03/18/2019 08:53    Alejandro Lee was evaluated in Emergency Department on 03/18/2019 for the symptoms described in the history of present illness. He was evaluated in the context of the global COVID-19 pandemic, which necessitated consideration that the patient might be at risk for infection with the SARS-CoV-2 virus that causes COVID-19. Institutional protocols and algorithms that pertain to the evaluation of patients at risk for COVID-19 are in a state of rapid change based on information released by regulatory bodies including the CDC and federal and state organizations. These policies and algorithms were followed during the patient's care in the ED.    1230:  Troponin trending downward. EKG x2 unchanged. Pt c/o symptoms only when PVC noted on monitor. Denies any chest discomfort otherwise. Pt also has reassuring hx normal stress test several months ago. Pt would like to go home.  T/C returned from [facility] Cards Dr. Bronson Ing, case discussed, including:  HPI, pertinent PM/SHx, VS/PE, dx testing, ED course and treatment:  Pt does not need to be admitted at this time, Cards will arrange outpatient f/u and further cardiac testing. Dx and testing, as well as d/w Cards MD, d/w pt and family.  Questions answered.  Verb understanding, agreeable to d/c home with outpt f/u.       Final Clinical Impressions(s) / ED Diagnoses   Final diagnoses:  None    ED Discharge Orders  None       Francine Graven, DO 03/21/19 [phone]

## 2019-03-18 NOTE — ED Notes (Signed)
Called Dr Bronson Ing and made follow up appointment for patient per Dr Thurnell Garbe. Spoke with Jarrett Soho in Cardiology, appointment scheduled for Sept 21, 2020 @ 0920. Patient given appointment and verbalized understanding.

## 2019-03-18 NOTE — ED Triage Notes (Signed)
Pt sent from dialysis for chest discomfort. He states that his heart feels like it started beating hard and then stops until he coughs he has had multiple times.

## 2019-03-19 ENCOUNTER — Ambulatory Visit (HOSPITAL_COMMUNITY): Payer: Medicare Other | Admitting: Certified Registered Nurse Anesthetist

## 2019-03-19 ENCOUNTER — Other Ambulatory Visit: Payer: Self-pay

## 2019-03-19 ENCOUNTER — Encounter (HOSPITAL_COMMUNITY)
Admission: RE | Disposition: A | Discharge status: Home / Self Care | Payer: Self-pay | Source: Home / Self Care | Attending: Surgery

## 2019-03-19 ENCOUNTER — Ambulatory Visit (HOSPITAL_COMMUNITY)
Admission: RE | Admit: 2019-03-19 | Discharge: 2019-03-19 | Disposition: A | Discharge status: Home / Self Care | Payer: Medicare Other | Attending: Surgery | Admitting: Surgery

## 2019-03-19 ENCOUNTER — Encounter (HOSPITAL_COMMUNITY): Payer: Self-pay | Admitting: *Deleted

## 2019-03-19 DIAGNOSIS — I509 Heart failure, unspecified: Secondary | ICD-10-CM | POA: Insufficient documentation

## 2019-03-19 DIAGNOSIS — X58XXXA Exposure to other specified factors, initial encounter: Secondary | ICD-10-CM | POA: Diagnosis not present

## 2019-03-19 DIAGNOSIS — K219 Gastro-esophageal reflux disease without esophagitis: Secondary | ICD-10-CM | POA: Diagnosis not present

## 2019-03-19 DIAGNOSIS — R51 Headache: Secondary | ICD-10-CM | POA: Diagnosis not present

## 2019-03-19 DIAGNOSIS — Z992 Dependence on renal dialysis: Secondary | ICD-10-CM | POA: Diagnosis not present

## 2019-03-19 DIAGNOSIS — Z87891 Personal history of nicotine dependence: Secondary | ICD-10-CM | POA: Insufficient documentation

## 2019-03-19 DIAGNOSIS — M1990 Unspecified osteoarthritis, unspecified site: Secondary | ICD-10-CM | POA: Diagnosis not present

## 2019-03-19 DIAGNOSIS — D631 Anemia in chronic kidney disease: Secondary | ICD-10-CM | POA: Diagnosis not present

## 2019-03-19 DIAGNOSIS — E1122 Type 2 diabetes mellitus with diabetic chronic kidney disease: Secondary | ICD-10-CM | POA: Insufficient documentation

## 2019-03-19 DIAGNOSIS — I252 Old myocardial infarction: Secondary | ICD-10-CM | POA: Diagnosis not present

## 2019-03-19 DIAGNOSIS — N186 End stage renal disease: Secondary | ICD-10-CM | POA: Diagnosis not present

## 2019-03-19 DIAGNOSIS — T82898A Other specified complication of vascular prosthetic devices, implants and grafts, initial encounter: Secondary | ICD-10-CM | POA: Insufficient documentation

## 2019-03-19 DIAGNOSIS — I120 Hypertensive chronic kidney disease with stage 5 chronic kidney disease or end stage renal disease: Secondary | ICD-10-CM | POA: Diagnosis not present

## 2019-03-19 DIAGNOSIS — Z6832 Body mass index (BMI) 32.0-32.9, adult: Secondary | ICD-10-CM | POA: Insufficient documentation

## 2019-03-19 DIAGNOSIS — Z79899 Other long term (current) drug therapy: Secondary | ICD-10-CM | POA: Insufficient documentation

## 2019-03-19 DIAGNOSIS — I132 Hypertensive heart and chronic kidney disease with heart failure and with stage 5 chronic kidney disease, or end stage renal disease: Secondary | ICD-10-CM | POA: Diagnosis not present

## 2019-03-19 HISTORY — DX: Anemia, unspecified: D64.9

## 2019-03-19 HISTORY — PX: LIGATION OF ARTERIOVENOUS  FISTULA: SHX5948

## 2019-03-19 LAB — GLUCOSE, CAPILLARY
Glucose-Capillary: 105 mg/dL — ABNORMAL HIGH (ref 70–99)
Glucose-Capillary: 104 mg/dL — ABNORMAL HIGH (ref 70–99)

## 2019-03-19 SURGERY — LIGATION OF ARTERIOVENOUS  FISTULA
Anesthesia: Monitor Anesthesia Care | Laterality: Left

## 2019-03-19 MED ORDER — FENTANYL CITRATE (PF) 100 MCG/2ML IJ SOLN
INTRAMUSCULAR | Status: DC | PRN
  Administered 2019-03-19: 50 ug via INTRAVENOUS

## 2019-03-19 MED ORDER — CHLORHEXIDINE GLUCONATE 4 % EX LIQD: 60.0000 mL | Freq: Once | CUTANEOUS | Status: DC

## 2019-03-19 MED ORDER — ONDANSETRON HCL 4 MG/2ML IJ SOLN
INTRAMUSCULAR | Status: AC
  Filled 2019-03-19: qty 2

## 2019-03-19 MED ORDER — SODIUM CHLORIDE 0.9 % IV SOLN
INTRAVENOUS | Status: DC | PRN
  Administered 2019-03-19: 08:00:00 via INTRAVENOUS

## 2019-03-19 MED ORDER — SODIUM CHLORIDE 0.9 % IV SOLN
INTRAVENOUS | Status: DC | PRN
  Administered 2019-03-19: 08:00:00 500 mL

## 2019-03-19 MED ORDER — SODIUM CHLORIDE 0.9 % IV SOLN: INTRAVENOUS | Status: DC

## 2019-03-19 MED ORDER — PROPOFOL 10 MG/ML IV BOLUS
INTRAVENOUS | Status: AC
  Filled 2019-03-19: qty 20

## 2019-03-19 MED ORDER — ACETAMINOPHEN 160 MG/5ML PO SOLN: 1000.0000 mg | Freq: Once | ORAL | Status: DC | PRN

## 2019-03-19 MED ORDER — ONDANSETRON HCL 4 MG/2ML IJ SOLN
INTRAMUSCULAR | Status: DC | PRN
  Administered 2019-03-19: 4 mg via INTRAVENOUS

## 2019-03-19 MED ORDER — ACETAMINOPHEN 10 MG/ML IV SOLN: 1000.0000 mg | Freq: Once | INTRAVENOUS | Status: DC | PRN

## 2019-03-19 MED ORDER — LIDOCAINE-EPINEPHRINE (PF) 1 %-1:200000 IJ SOLN
INTRAMUSCULAR | Status: DC | PRN
  Administered 2019-03-19: 30 mL

## 2019-03-19 MED ORDER — 0.9 % SODIUM CHLORIDE (POUR BTL) OPTIME
TOPICAL | Status: DC | PRN
  Administered 2019-03-19: 1000 mL

## 2019-03-19 MED ORDER — ACETAMINOPHEN 500 MG PO TABS: 1000.0000 mg | ORAL_TABLET | Freq: Once | ORAL | Status: DC | PRN

## 2019-03-19 MED ORDER — CEFAZOLIN SODIUM-DEXTROSE 2-4 GM/100ML-% IV SOLN
2.0000 g | INTRAVENOUS | Status: AC
  Administered 2019-03-19: 2 g via INTRAVENOUS
  Filled 2019-03-19: qty 100

## 2019-03-19 MED ORDER — PROPOFOL 10 MG/ML IV BOLUS
INTRAVENOUS | Status: DC | PRN
  Administered 2019-03-19: 20 mg via INTRAVENOUS

## 2019-03-19 MED ORDER — SODIUM CHLORIDE 0.9 % IV SOLN
INTRAVENOUS | Status: DC | PRN
  Administered 2019-03-19: 50 ug/min via INTRAVENOUS

## 2019-03-19 MED ORDER — PHENYLEPHRINE 40 MCG/ML (10ML) SYRINGE FOR IV PUSH (FOR BLOOD PRESSURE SUPPORT)
PREFILLED_SYRINGE | INTRAVENOUS | Status: AC
  Filled 2019-03-19: qty 10

## 2019-03-19 MED ORDER — FENTANYL CITRATE (PF) 250 MCG/5ML IJ SOLN
INTRAMUSCULAR | Status: AC
  Filled 2019-03-19: qty 5

## 2019-03-19 MED ORDER — LIDOCAINE-EPINEPHRINE (PF) 1 %-1:200000 IJ SOLN
INTRAMUSCULAR | Status: AC
  Filled 2019-03-19: qty 30

## 2019-03-19 MED ORDER — OXYCODONE HCL 5 MG PO TABS: 5.0000 mg | ORAL_TABLET | Freq: Once | ORAL | Status: DC | PRN

## 2019-03-19 MED ORDER — OXYCODONE HCL 5 MG/5ML PO SOLN: 5.0000 mg | Freq: Once | ORAL | Status: DC | PRN

## 2019-03-19 MED ORDER — FENTANYL CITRATE (PF) 100 MCG/2ML IJ SOLN: 25.0000 ug | INTRAMUSCULAR | Status: DC | PRN

## 2019-03-19 MED ORDER — PROPOFOL 500 MG/50ML IV EMUL
INTRAVENOUS | Status: DC | PRN
  Administered 2019-03-19: 110 ug/kg/min via INTRAVENOUS

## 2019-03-19 MED ORDER — SODIUM CHLORIDE 0.9 % IV SOLN
INTRAVENOUS | Status: AC
  Filled 2019-03-19: qty 1.2

## 2019-03-19 SURGICAL SUPPLY — 37 items
CANISTER SUCT 3000ML PPV (MISCELLANEOUS) ×4 IMPLANT
CLIP VESOCCLUDE MED 6/CT (CLIP) ×4 IMPLANT
CLIP VESOCCLUDE SM WIDE 6/CT (CLIP) ×4 IMPLANT
COVER PROBE W GEL 5X96 (DRAPES) ×4 IMPLANT
COVER WAND RF STERILE (DRAPES) IMPLANT
DERMABOND ADVANCED (GAUZE/BANDAGES/DRESSINGS) ×2
DERMABOND ADVANCED .7 DNX12 (GAUZE/BANDAGES/DRESSINGS) ×2 IMPLANT
ELECT REM PT RETURN 9FT ADLT (ELECTROSURGICAL) ×4
ELECTRODE REM PT RTRN 9FT ADLT (ELECTROSURGICAL) ×2 IMPLANT
GLOVE BIO SURGEON STRL SZ 6.5 (GLOVE) ×3 IMPLANT
GLOVE BIO SURGEONS STRL SZ 6.5 (GLOVE) ×1
GLOVE BIOGEL PI IND STRL 7.0 (GLOVE) ×2 IMPLANT
GLOVE BIOGEL PI IND STRL 7.5 (GLOVE) ×2 IMPLANT
GLOVE BIOGEL PI INDICATOR 7.0 (GLOVE) ×2
GLOVE BIOGEL PI INDICATOR 7.5 (GLOVE) ×2
GLOVE SURG SS PI 6.5 STRL IVOR (GLOVE) ×4 IMPLANT
GLOVE SURG SS PI 7.5 STRL IVOR (GLOVE) ×4 IMPLANT
GOWN STRL REUS W/ TWL LRG LVL3 (GOWN DISPOSABLE) ×2 IMPLANT
GOWN STRL REUS W/ TWL XL LVL3 (GOWN DISPOSABLE) ×4 IMPLANT
GOWN STRL REUS W/TWL LRG LVL3 (GOWN DISPOSABLE) ×2
GOWN STRL REUS W/TWL XL LVL3 (GOWN DISPOSABLE) ×4
HEMOSTAT SNOW SURGICEL 2X4 (HEMOSTASIS) IMPLANT
KIT BASIN OR (CUSTOM PROCEDURE TRAY) ×4 IMPLANT
KIT TURNOVER KIT B (KITS) ×4 IMPLANT
NS IRRIG 1000ML POUR BTL (IV SOLUTION) ×4 IMPLANT
PACK CV ACCESS (CUSTOM PROCEDURE TRAY) ×4 IMPLANT
PAD ARMBOARD 7.5X6 YLW CONV (MISCELLANEOUS) ×8 IMPLANT
SUT ETHILON 3 0 PS 1 (SUTURE) IMPLANT
SUT PROLENE 6 0 BV (SUTURE) ×4 IMPLANT
SUT SILK 0 TIES 10X30 (SUTURE) IMPLANT
SUT VIC AB 3-0 SH 27 (SUTURE) ×2
SUT VIC AB 3-0 SH 27X BRD (SUTURE) ×2 IMPLANT
SUT VIC AB 4-0 PS2 18 (SUTURE) ×4 IMPLANT
SUT VICRYL 4-0 PS2 18IN ABS (SUTURE) IMPLANT
TOWEL GREEN STERILE (TOWEL DISPOSABLE) ×4 IMPLANT
UNDERPAD 30X30 (UNDERPADS AND DIAPERS) ×4 IMPLANT
WATER STERILE IRR 1000ML POUR (IV SOLUTION) ×4 IMPLANT

## 2019-03-19 NOTE — Transfer of Care (Signed)
Immediate Anesthesia Transfer of Care Note  Patient: Alejandro Lee  Procedure(s) Performed: LIGATION OF ARTERIOVENOUS  OF COMPETING BRANCHES OF FISTULA LEFT UPPER ARM  Patient Location: PACU  Anesthesia Type:MAC  Level of Consciousness: awake, alert  and oriented  Airway & Oxygen Therapy: Patient Spontanous Breathing  Post-op Assessment: Report given to RN and Post -op Vital signs reviewed and stable  Post vital signs: Reviewed and stable  Last Vitals:  Vitals Value Taken Time  BP 130/87   Temp    Pulse 89   Resp 12   SpO2 95     Last Pain:  Vitals:   03/19/19 0635  TempSrc:   PainSc: 0-No pain      Patients Stated Pain Goal: 10 (60/04/59 9774)  Complications: No apparent anesthesia complications

## 2019-03-19 NOTE — Interval H&P Note (Signed)
History and Physical Interval Note:  03/19/2019 7:29 AM  Alejandro Lee  has presented today for surgery, with the diagnosis of COMPLICATION OF ARTERIOVENOUS FISTULA LEFT ARM.  The various methods of treatment have been discussed with the patient and family. After consideration of risks, benefits and other options for treatment, the patient has consented to  Procedure(s): LIGATION OF ARTERIOVENOUS  FISTULA LEFT ARM (Left) ANGIOPLASTY LEFT ARM ARTERIOVENOUS FISTULA (Left) as a surgical intervention.  The patient's history has been reviewed, patient examined, no change in status, stable for surgery.  I have reviewed the patient's chart and labs.  Questions were answered to the patient's satisfaction.     Annamarie Major

## 2019-03-19 NOTE — Anesthesia Preprocedure Evaluation (Signed)
Anesthesia Evaluation  Patient identified by MRN, date of birth, ID band Patient awake    Reviewed: Allergy & Precautions, NPO status , Patient's Chart, lab work & pertinent test results  History of Anesthesia Complications Negative for: history of anesthetic complications  Airway Mallampati: II  TM Distance: >3 FB Neck ROM: Full    Dental  (+) Dental Advisory Given   Pulmonary neg shortness of breath, neg sleep apnea, neg COPD, neg recent URI, former smoker,    breath sounds clear to auscultation       Cardiovascular hypertension, Pt. on medications (-) angina+ Past MI and +CHF   Rhythm:Regular     Neuro/Psych  Headaches, neg Seizures negative psych ROS   GI/Hepatic Neg liver ROS, GERD  ,  Endo/Other  diabetesMorbid obesity  Renal/GU ESRF and DialysisRenal disease     Musculoskeletal  (+) Arthritis ,   Abdominal   Peds  Hematology  (+) anemia ,   Anesthesia Other Findings   Reproductive/Obstetrics                             Anesthesia Physical Anesthesia Plan  ASA: IV  Anesthesia Plan: MAC   Post-op Pain Management:    Induction: Intravenous  PONV Risk Score and Plan: 1 and Treatment may vary due to age or medical condition and Propofol infusion  Airway Management Planned: Nasal Cannula  Additional Equipment: None  Intra-op Plan:   Post-operative Plan:   Informed Consent: I have reviewed the patients History and Physical, chart, labs and discussed the procedure including the risks, benefits and alternatives for the proposed anesthesia with the patient or authorized representative who has indicated his/her understanding and acceptance.     Dental advisory given  Plan Discussed with: CRNA and Surgeon  Anesthesia Plan Comments:         Anesthesia Quick Evaluation

## 2019-03-19 NOTE — Op Note (Signed)
    Patient name: Alejandro Lee MRN: [id number] DOB: August 15, 1960 Sex: male  03/19/2019 Pre-operative Diagnosis: Non-maturing left brachiocephalic fistula Post-operative diagnosis:  Same Surgeon:  Annamarie Major Assistants: Linus Orn Procedure:   Ligation of branches x2 to left brachiocephalic fistula Anesthesia: MAC Blood Loss: None Specimens: None  Findings: 2 branches were ligated.  I also passed a #4 dilator easily across the anastomosis.  Indications: The patient has a non-maturing left brachiocephalic fistula.  He is here today for branch ligation  Procedure:  The patient was identified in the holding area and taken to [place] 11  The patient was then placed supine on the table. MAC anesthesia was administered.  The patient was prepped and draped in the usual sterile fashion.  A time out was called and antibiotics were administered.  Ultrasound was used to evaluate the fistula.  The branch was identified under ultrasound.  1% lidocaine was used for local anesthesia.  2 incisions were made over top of the branches.  The more proximal branch was ligated with a 2-0 silk tie then divided.  The distal branch, the one near the antecubital crease was divided distally.  I then occluded the vein with a vessel loop and passed sequential dilators across the arterial venous anastomosis.  A 4 dilator went without resistance.  I then oversewed the proximal portion of this branch through which I was passing the dilator with 6-0 Prolene.  There was a palpable thrill within the fistula.  I evaluated the vein with ultrasound.  It did appear to be of adequate size for maturation.  The wound was then irrigated.  The incision was closed with 4-0 Vicryl and Dermabond.  There were no immediate complications.   Disposition: To PACU stable.   Theotis Burrow, M.D., [facility] [place] Office: [phone] Pager:  [phone]

## 2019-03-19 NOTE — Progress Notes (Addendum)
Spoke with Dr. Conrad Speedway regarding where to draw blood for I-stat this am. Right arm restricted, surgery on left arm and pt. Is diabetic. He stated to wait and get the blood when nurse anesthetist places the  IV.  No labs per Dr. Ermalene Postin. Pt. Had labs yesterday, viewed in epic.

## 2019-03-19 NOTE — Anesthesia Procedure Notes (Signed)
Procedure Name: MAC Date/Time: 03/19/2019 7:48 AM Performed by: Inda Coke, CRNA Pre-anesthesia Checklist: Patient identified, Emergency Drugs available, Suction available, Timeout performed and Patient being monitored Patient Re-evaluated:Patient Re-evaluated prior to induction Oxygen Delivery Method: Simple face mask Induction Type: IV induction Dental Injury: Teeth and Oropharynx as per pre-operative assessment

## 2019-03-20 ENCOUNTER — Encounter (HOSPITAL_COMMUNITY): Payer: Self-pay | Admitting: Surgery

## 2019-03-20 DIAGNOSIS — Z992 Dependence on renal dialysis: Secondary | ICD-10-CM | POA: Diagnosis not present

## 2019-03-20 DIAGNOSIS — D631 Anemia in chronic kidney disease: Secondary | ICD-10-CM | POA: Diagnosis not present

## 2019-03-20 DIAGNOSIS — N2581 Secondary hyperparathyroidism of renal origin: Secondary | ICD-10-CM | POA: Diagnosis not present

## 2019-03-20 DIAGNOSIS — N186 End stage renal disease: Secondary | ICD-10-CM | POA: Diagnosis not present

## 2019-03-20 DIAGNOSIS — D509 Iron deficiency anemia, unspecified: Secondary | ICD-10-CM | POA: Diagnosis not present

## 2019-03-23 DIAGNOSIS — N2581 Secondary hyperparathyroidism of renal origin: Secondary | ICD-10-CM | POA: Diagnosis not present

## 2019-03-23 DIAGNOSIS — Z992 Dependence on renal dialysis: Secondary | ICD-10-CM | POA: Diagnosis not present

## 2019-03-23 DIAGNOSIS — D509 Iron deficiency anemia, unspecified: Secondary | ICD-10-CM | POA: Diagnosis not present

## 2019-03-23 DIAGNOSIS — D631 Anemia in chronic kidney disease: Secondary | ICD-10-CM | POA: Diagnosis not present

## 2019-03-23 DIAGNOSIS — N186 End stage renal disease: Secondary | ICD-10-CM | POA: Diagnosis not present

## 2019-03-25 DIAGNOSIS — D631 Anemia in chronic kidney disease: Secondary | ICD-10-CM | POA: Diagnosis not present

## 2019-03-25 DIAGNOSIS — D509 Iron deficiency anemia, unspecified: Secondary | ICD-10-CM | POA: Diagnosis not present

## 2019-03-25 DIAGNOSIS — N186 End stage renal disease: Secondary | ICD-10-CM | POA: Diagnosis not present

## 2019-03-25 DIAGNOSIS — N2581 Secondary hyperparathyroidism of renal origin: Secondary | ICD-10-CM | POA: Diagnosis not present

## 2019-03-25 DIAGNOSIS — Z992 Dependence on renal dialysis: Secondary | ICD-10-CM | POA: Diagnosis not present

## 2019-03-25 NOTE — Anesthesia Postprocedure Evaluation (Signed)
Anesthesia Post Note  Patient: Alejandro Lee  Procedure(s) Performed: LIGATION OF ARTERIOVENOUS  OF COMPETING BRANCHES OF FISTULA LEFT UPPER ARM     Patient location during evaluation: PACU Anesthesia Type: MAC Level of consciousness: awake and alert Pain management: pain level controlled Vital Signs Assessment: post-procedure vital signs reviewed and stable Respiratory status: spontaneous breathing, nonlabored ventilation, respiratory function stable and patient connected to nasal cannula oxygen Cardiovascular status: stable and blood pressure returned to baseline Postop Assessment: no apparent nausea or vomiting Anesthetic complications: no    Last Vitals:  Vitals:   03/19/19 0915 03/19/19 0920  BP: (!) 170/75 (!) 172/80  Pulse: 73 72  Resp: 16 15  Temp:  36.4 C  SpO2: 95% 96%    Last Pain:  Vitals:   03/19/19 0920  TempSrc:   PainSc: 0-No pain                 Kierre Deines

## 2019-03-27 DIAGNOSIS — D631 Anemia in chronic kidney disease: Secondary | ICD-10-CM | POA: Diagnosis not present

## 2019-03-27 DIAGNOSIS — D509 Iron deficiency anemia, unspecified: Secondary | ICD-10-CM | POA: Diagnosis not present

## 2019-03-27 DIAGNOSIS — N2581 Secondary hyperparathyroidism of renal origin: Secondary | ICD-10-CM | POA: Diagnosis not present

## 2019-03-27 DIAGNOSIS — N186 End stage renal disease: Secondary | ICD-10-CM | POA: Diagnosis not present

## 2019-03-27 DIAGNOSIS — Z992 Dependence on renal dialysis: Secondary | ICD-10-CM | POA: Diagnosis not present

## 2019-03-30 DIAGNOSIS — Z992 Dependence on renal dialysis: Secondary | ICD-10-CM | POA: Diagnosis not present

## 2019-03-30 DIAGNOSIS — N2581 Secondary hyperparathyroidism of renal origin: Secondary | ICD-10-CM | POA: Diagnosis not present

## 2019-03-30 DIAGNOSIS — D631 Anemia in chronic kidney disease: Secondary | ICD-10-CM | POA: Diagnosis not present

## 2019-03-30 DIAGNOSIS — N186 End stage renal disease: Secondary | ICD-10-CM | POA: Diagnosis not present

## 2019-03-30 DIAGNOSIS — D509 Iron deficiency anemia, unspecified: Secondary | ICD-10-CM | POA: Diagnosis not present

## 2019-04-03 DIAGNOSIS — D631 Anemia in chronic kidney disease: Secondary | ICD-10-CM | POA: Diagnosis not present

## 2019-04-03 DIAGNOSIS — N186 End stage renal disease: Secondary | ICD-10-CM | POA: Diagnosis not present

## 2019-04-03 DIAGNOSIS — Z992 Dependence on renal dialysis: Secondary | ICD-10-CM | POA: Diagnosis not present

## 2019-04-03 DIAGNOSIS — N2581 Secondary hyperparathyroidism of renal origin: Secondary | ICD-10-CM | POA: Diagnosis not present

## 2019-04-03 DIAGNOSIS — D509 Iron deficiency anemia, unspecified: Secondary | ICD-10-CM | POA: Diagnosis not present

## 2019-04-06 DIAGNOSIS — N2581 Secondary hyperparathyroidism of renal origin: Secondary | ICD-10-CM | POA: Diagnosis not present

## 2019-04-06 DIAGNOSIS — D631 Anemia in chronic kidney disease: Secondary | ICD-10-CM | POA: Diagnosis not present

## 2019-04-06 DIAGNOSIS — N186 End stage renal disease: Secondary | ICD-10-CM | POA: Diagnosis not present

## 2019-04-06 DIAGNOSIS — D509 Iron deficiency anemia, unspecified: Secondary | ICD-10-CM | POA: Diagnosis not present

## 2019-04-06 DIAGNOSIS — Z992 Dependence on renal dialysis: Secondary | ICD-10-CM | POA: Diagnosis not present

## 2019-04-07 ENCOUNTER — Other Ambulatory Visit: Payer: Self-pay

## 2019-04-07 ENCOUNTER — Emergency Department (HOSPITAL_COMMUNITY)
Admission: EM | Admit: 2019-04-07 | Discharge: 2019-04-08 | Disposition: A | Discharge status: Home / Self Care | Payer: Medicare Other | Attending: Emergency Medicine | Admitting: Emergency Medicine

## 2019-04-07 ENCOUNTER — Encounter (HOSPITAL_COMMUNITY): Payer: Self-pay | Admitting: *Deleted

## 2019-04-07 DIAGNOSIS — Z6832 Body mass index (BMI) 32.0-32.9, adult: Secondary | ICD-10-CM | POA: Insufficient documentation

## 2019-04-07 DIAGNOSIS — I252 Old myocardial infarction: Secondary | ICD-10-CM | POA: Insufficient documentation

## 2019-04-07 DIAGNOSIS — E1122 Type 2 diabetes mellitus with diabetic chronic kidney disease: Secondary | ICD-10-CM | POA: Insufficient documentation

## 2019-04-07 DIAGNOSIS — R238 Other skin changes: Secondary | ICD-10-CM | POA: Diagnosis not present

## 2019-04-07 DIAGNOSIS — I5032 Chronic diastolic (congestive) heart failure: Secondary | ICD-10-CM | POA: Diagnosis not present

## 2019-04-07 DIAGNOSIS — Z79899 Other long term (current) drug therapy: Secondary | ICD-10-CM | POA: Diagnosis not present

## 2019-04-07 DIAGNOSIS — E669 Obesity, unspecified: Secondary | ICD-10-CM | POA: Insufficient documentation

## 2019-04-07 DIAGNOSIS — Z87891 Personal history of nicotine dependence: Secondary | ICD-10-CM | POA: Diagnosis not present

## 2019-04-07 DIAGNOSIS — M7989 Other specified soft tissue disorders: Secondary | ICD-10-CM | POA: Insufficient documentation

## 2019-04-07 DIAGNOSIS — N186 End stage renal disease: Secondary | ICD-10-CM | POA: Insufficient documentation

## 2019-04-07 DIAGNOSIS — I132 Hypertensive heart and chronic kidney disease with heart failure and with stage 5 chronic kidney disease, or end stage renal disease: Secondary | ICD-10-CM | POA: Insufficient documentation

## 2019-04-07 DIAGNOSIS — M79601 Pain in right arm: Secondary | ICD-10-CM

## 2019-04-07 DIAGNOSIS — Z992 Dependence on renal dialysis: Secondary | ICD-10-CM | POA: Insufficient documentation

## 2019-04-07 NOTE — ED Triage Notes (Signed)
Pt c/o pain to his right arm; pt states he was told that he has a "dead artery" in his arm and it is causing pain and swelling to left arm up to his shoulder blade and down his back

## 2019-04-08 DIAGNOSIS — M7989 Other specified soft tissue disorders: Secondary | ICD-10-CM | POA: Diagnosis not present

## 2019-04-08 DIAGNOSIS — Z992 Dependence on renal dialysis: Secondary | ICD-10-CM | POA: Diagnosis not present

## 2019-04-08 DIAGNOSIS — D631 Anemia in chronic kidney disease: Secondary | ICD-10-CM | POA: Diagnosis not present

## 2019-04-08 DIAGNOSIS — N186 End stage renal disease: Secondary | ICD-10-CM | POA: Diagnosis not present

## 2019-04-08 DIAGNOSIS — D509 Iron deficiency anemia, unspecified: Secondary | ICD-10-CM | POA: Diagnosis not present

## 2019-04-08 DIAGNOSIS — N2581 Secondary hyperparathyroidism of renal origin: Secondary | ICD-10-CM | POA: Diagnosis not present

## 2019-04-08 LAB — BASIC METABOLIC PANEL
Anion gap: 15 (ref 5–15)
BUN: 64 mg/dL — ABNORMAL HIGH (ref 6–20)
CO2: 26 mmol/L (ref 22–32)
Calcium: 9.5 mg/dL (ref 8.9–10.3)
Chloride: 99 mmol/L (ref 98–111)
Creatinine, Ser: 14.37 mg/dL — ABNORMAL HIGH (ref 0.61–1.24)
GFR calc Af Amer: 4 mL/min — ABNORMAL LOW (ref >60)
GFR calc non Af Amer: 3 mL/min — ABNORMAL LOW (ref >60)
Glucose, Bld: 127 mg/dL — ABNORMAL HIGH (ref 70–99)
Potassium: 4.6 mmol/L (ref 3.5–5.1)
Sodium: 140 mmol/L (ref 135–145)

## 2019-04-08 MED ORDER — HYDROCODONE-ACETAMINOPHEN 5-325 MG PO TABS
2.0000 | ORAL_TABLET | Freq: Once | ORAL | Status: AC
  Administered 2019-04-08: 02:00:00 2 via ORAL
  Filled 2019-04-08: qty 2

## 2019-04-08 MED ORDER — HYDROMORPHONE HCL 1 MG/ML IJ SOLN
1.0000 mg | Freq: Once | INTRAMUSCULAR | Status: AC
  Administered 2019-04-08: 01:00:00 1 mg via INTRAMUSCULAR
  Filled 2019-04-08: qty 1

## 2019-04-08 MED ORDER — ENOXAPARIN SODIUM 100 MG/ML ~~LOC~~ SOLN
1.0000 mg/kg | Freq: Once | SUBCUTANEOUS | Status: AC
  Administered 2019-04-08: 95.3 mg via SUBCUTANEOUS
  Filled 2019-04-08: qty 1

## 2019-04-08 MED ORDER — HYDROCODONE-ACETAMINOPHEN 5-325 MG PO TABS: 1.0000 | ORAL_TABLET | ORAL | 0 refills | Status: DC | PRN

## 2019-04-08 NOTE — ED Provider Notes (Addendum)
[facility] EMERGENCY DEPARTMENT Provider Note   CSN: [id number] Arrival date & time: 04/07/19  2112     History   Chief Complaint Chief Complaint  Patient presents with  . Arm Pain    HPI Alejandro Lee is a 58 y.o. male.     Alejandro Lee is a 58 y.o. male status post left BCF on 02-05-2019.  He is on HD via a right arm fistula with known central vein occlusion.  Presents with worsening pain and swelling to his right arm over the past month or more.  States he was seen by his vascular surgeon and was told there is nothing he could do until his access in the left side matured.  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  Does not take any anticoagulation however.  Patient reports increasing pain without evidence of trauma.  He is out of hydrocodone.  He denies any fevers, chills, nausea or vomiting.  No chest pain or shortness of breath.  States he has dialysis in the morning and is Monday Wednesday Friday.  Is not missed any sessions. The pain is in his entire right arm and worse when he moves his head.  He denies any falls or trauma.  Patient does have a history of DVT in the past but not recently.  He denies having an ultrasound of this arm.  He was told by his vascular surgeon that nothing could be done until his dialysis access was switched to his left arm.  The history is provided by the patient.  Arm Pain Pertinent negatives include no chest pain, no abdominal pain, no headaches and no shortness of breath.    Past Medical History:  Diagnosis Date  . Anemia    1 blood transfusion  . Arthritis   . Back pain   . CHF (congestive heart failure) ([place])   . Chronic kidney disease    dialysis M/W/F  . Diabetes mellitus without complication ([place])   . DVT (deep venous thrombosis) ([place])   . GERD (gastroesophageal reflux disease)   . Headache    migraines  . History of cardiovascular stress test 11/2017   [facility]) negative dobutamine stress test  . Hyperlipidemia   .  Hypertension   . Legally blind    right  . Myocardial infarction [facility])    patient states it was seen on EKG, denies a cardiac cath    Patient Active Problem List   Diagnosis Date Noted  . ESRD on hemodialysis ([place])   . Hyperkalemia 06/25/2018  . Non-compliance with renal dialysis ([place]) 06/25/2018  . Generalized abdominal pain 06/25/2018  . Chronic back pain 06/25/2018  . GERD (gastroesophageal reflux disease) 06/25/2018  . Chronic diastolic HF (heart failure) ([facility]) 06/25/2018  . Class 1 obesity due to excess calories in adult 06/25/2018  . Benign essential HTN 06/25/2018  . 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[place]) 12/25/2017  . Type 2 diabetes mellitus with other specified complication ([place]) [id number]  . ESRD (end stage renal disease) on dialysis ([place]) 12/19/2017  . Chronic right-sided low back pain without sciatica 12/19/2017  . Pseudophakia of left eye 09/03/2017  . PDR (proliferative diabetic retinopathy) ([place]) 09/03/2017  . Cataract in degenerative disorder 09/03/2017  . Unspecified complication of internal prosthetic device, implant and graft, initial encounter 05/17/2017  . Rectal abscess 05/17/2017  . 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 Metabolic disorder [id number]  . Hypertension 05/16/2017  . Dependence on renal dialysis ([place])  05/16/2017  . Anemia of chronic renal failure, stage 5 (HCC) 05/16/2017  . Retinal detachment, tractional, both eyes 06/19/2012    Past Surgical History:  Procedure Laterality Date  . A/V FISTULAGRAM Right 07/22/2018   Procedure: A/V FISTULAGRAM;  Surgeon: Serafina Mitchell, MD;  Location: [place] [facility];  Service: Cardiovascular;  Laterality: Right;  . AV FISTULA PLACEMENT Right   . AV FISTULA PLACEMENT Right [id number]   Procedure: PLICATION OF RIGHT arm FISTULA;  Surgeon: Waynetta Sandy, MD;  Location: [place];  Service: Vascular;  Laterality: Right;  . BASCILIC VEIN TRANSPOSITION  Left 02/05/2019   Procedure: BRACHIOCEPHALIC FISTULA  LEFT ARM;  Surgeon: Serafina Mitchell, MD;  Location: [facility] OR;  Service: Vascular;  Laterality: Left;  . COLONOSCOPY    . diabetic cyst removal Bilateral    on buttocks x8  . EYE SURGERY    . FISTULA SUPERFICIALIZATION Right [id number]   Procedure: PLICATION OF ARTERIOVENOUS FISTULA RIGHT ARM;  Surgeon: Conrad Spofford, MD;  Location: [place];  Service: Vascular;  Laterality: Right;  . lens replacement Left   . LIGATION OF ARTERIOVENOUS  FISTULA  03/19/2019   Procedure: LIGATION OF ARTERIOVENOUS  OF COMPETING BRANCHES OF FISTULA LEFT UPPER ARM;  Surgeon: Serafina Mitchell, MD;  Location: [facility] OR;  Service: Vascular;;  . PERIPHERAL VASCULAR BALLOON ANGIOPLASTY  07/22/2018   Procedure: PERIPHERAL VASCULAR BALLOON ANGIOPLASTY;  Surgeon: Serafina Mitchell, MD;  Location: [place] [facility];  Service: Cardiovascular;;  Right [place] fistula   . PERIPHERAL VASCULAR BALLOON ANGIOPLASTY Right 02/03/2019   Procedure: PERIPHERAL VASCULAR BALLOON ANGIOPLASTY;  Surgeon: Serafina Mitchell, MD;  Location: [place] [facility];  Service: Cardiovascular;  Laterality: Right;  RUE AVF  . REFRACTIVE SURGERY Bilateral         Home Medications    Prior to Admission medications   Medication Sig Start Date End Date Taking? Authorizing Provider  acetaminophen (TYLENOL) 500 MG tablet Take 1,000 mg by mouth every 6 (six) hours as needed for moderate pain.    [provider]  amLODipine (NORVASC) 5 MG tablet Take 1 tablet (5 mg total) by mouth daily. 06/26/18   Barton Dubois, MD  calcium acetate (PHOSLO) 667 MG capsule Take 2,001 mg by mouth 3 (three) times daily with meals.     [provider]  HYDROcodone-acetaminophen (NORCO/VICODIN) 5-325 MG tablet Take 1 tablet by mouth every 4 (four) hours as needed for moderate pain. 03/16/19 03/15/20  Serafina Mitchell, MD  lidocaine-prilocaine (EMLA) cream Apply 1 application topically as needed (port acces).    [provider]  loperamide (IMODIUM A-D) 2 MG tablet Take 2 mg by mouth 4 (four) times daily as needed for diarrhea or loose stools.    [provider]  sitaGLIPtin (JANUVIA) 25 MG tablet Take 25 mg by mouth daily.    [provider]  sorbitol 70 % solution Take 15 mLs by mouth daily as needed (constipation).     [provider]  VITAMIN D PO Take 1 capsule by mouth 4 (four) times a week. Tues, Thurs, Sat, and Sun (non-dialysis days)    [provider]    Family History Family History  Problem Relation Age of Onset  . Diabetes Mother   . Hypertension Mother   . Diabetes Father   . Heart disease Father   . Cancer Father   . Hypertension Father   . Diabetes Sister   . Hypertension Sister   . Diabetes  Brother   . Hypertension Brother   . Cancer Brother     Social History Social History   Tobacco Use  . Smoking status: Former Smoker    Types: Cigarettes  . Smokeless tobacco: Never Used  Substance Use Topics  . Alcohol use: Not Currently  . Drug use: Never     Allergies   Patient has no known allergies.   Review of Systems Review of Systems  Constitutional: Negative for activity change, appetite change and fever.  HENT: Negative for congestion and rhinorrhea.   Eyes: Negative for visual disturbance.  Respiratory: Negative for cough, chest tightness and shortness of breath.   Cardiovascular: Negative for chest pain.  Gastrointestinal: Negative for abdominal pain, nausea and vomiting.  Genitourinary: Negative for dysuria and hematuria.  Musculoskeletal: Positive for arthralgias and myalgias.  Neurological: Negative for dizziness, weakness and headaches.   all other systems are negative except as noted in the HPI and PMH.     Physical Exam Updated Vital Signs BP (!) 204/87 (BP Location: Left Leg)   Pulse 83   Temp 98.1 F (36.7 C) (Oral)   Resp 16   Ht 5\' 7"  (1.702 m)   Wt 95.3 kg   SpO2 99%   BMI 32.89 kg/m   Physical  Exam Vitals signs and nursing note reviewed.  Constitutional:      General: He is not in acute distress.    Appearance: He is well-developed. He is obese.     Comments: Uncomfortable  HENT:     Head: Normocephalic and atraumatic.     Mouth/Throat:     Pharynx: No oropharyngeal exudate.  Eyes:     Conjunctiva/sclera: Conjunctivae normal.     Pupils: Pupils are equal, round, and reactive to light.  Neck:     Musculoskeletal: Normal range of motion and neck supple.     Comments: No meningismus. Cardiovascular:     Rate and Rhythm: Normal rate and regular rhythm.     Heart sounds: Normal heart sounds. No murmur.  Pulmonary:     Effort: Pulmonary effort is normal. No respiratory distress.     Breath sounds: Normal breath sounds.  Chest:     Chest wall: No tenderness.  Abdominal:     Palpations: Abdomen is soft.     Tenderness: There is no abdominal tenderness. There is no guarding or rebound.  Musculoskeletal:        General: Swelling and tenderness present.     Comments: Massive swelling of entire right arm with reduced range of motion of the elbow and wrist due to swelling.  Intact radial pulse.  Increase cardinal hand movements. No significant warmth or erythema.  Thrill present over fistula on the right arm.  Collateral veins extending onto the shoulder, chest and upper back.  Thrill present fistula on the left.  No overlying skin changes.  Skin:    General: Skin is warm.     Capillary Refill: Capillary refill takes less than 2 seconds.  Neurological:     General: No focal deficit present.     Mental Status: He is alert and oriented to person, place, and time. Mental status is at baseline.     Cranial Nerves: No cranial nerve deficit.     Motor: No abnormal muscle tone.     Coordination: Coordination normal.     Comments: No ataxia on finger to nose bilaterally. No pronator drift. 5/5 strength throughout. CN 2-12 intact.Equal grip strength. Sensation intact.    Psychiatric:  Behavior: Behavior normal.      ED Treatments / Results  Labs (all labs ordered are listed, but only abnormal results are displayed) Labs Reviewed  BASIC METABOLIC PANEL - Abnormal; Notable for the following components:      Result Value   Glucose, Bld 127 (*)    BUN 64 (*)    Creatinine, Ser 14.37 (*)    GFR calc non Af Amer 3 (*)    GFR calc Af Amer 4 (*)    All other components within normal limits    EKG None  Radiology No results found.  Procedures Procedures (including critical care time)  Medications Ordered in ED Medications  HYDROmorphone (DILAUDID) injection 1 mg (has no administration in time range)     Initial Impression / Assessment and Plan / ED Course  I have reviewed the triage vital signs and the nursing notes.  Pertinent labs & imaging results that were available during my care of the patient were reviewed by me and considered in my medical decision making (see chart for details).       Right arm swelling at site of dialysis fistula with known central vein occlusion.  Intact distal pulses.  Discussed with Dr. Scot Dock [facility].  He states nothing to do acutely tonight.  Agrees with pain control and states can wrap Ace bandage around arm loosely.  Okay to include fistula. He states patient would benefit from duplex ultrasound in the morning.  Will cover with anticoagulation tonight.  Denies any need for specific imaging of the fistula itself. Unclear why patient is not on anticoagulation due to central vein occlusion.   Potassium is normal. Patient given dose of Lovenox tonight.  He agrees to ultrasound tomorrow to rule out DVT.  He has dialysis at 5 AM.  States he will come after this for his ultrasound.  Arm wrapped with Ace bandages per vascular surgery instructions.  Patient agreeable to return for duplex ultrasound tomorrow after dialysis.  Keep arm elevated.  Follow-up with his vascular surgeon.   Return precautions discussed.  Final Clinical Impressions(s) / ED Diagnoses   Final diagnoses:  Redness and swelling of upper arm    ED Discharge Orders    None       Jeweliana Dudgeon, Annie Main, MD 04/08/19 [id number]    Ezequiel Essex, MD 04/08/19 [phone]

## 2019-04-08 NOTE — Discharge Instructions (Signed)
Keep the arm elevated as we discussed.  Go to dialysis in the morning as scheduled.  Keep the arm wrapped when you are not being dialyzed.  You should return tomorrow for the ultrasound of your arm to rule out blood clots.  You are given a dose of blood thinner tonight.  Follow-up with Dr. Trula Slade as soon as possible.  Return to the ED for left chest pain, shortness of breath, any other concerns.

## 2019-04-08 NOTE — ED Notes (Signed)
Pt ambulatory to waiting room. Pt verbalized understanding of discharge instructions.   

## 2019-04-09 ENCOUNTER — Other Ambulatory Visit: Payer: Self-pay

## 2019-04-09 DIAGNOSIS — N186 End stage renal disease: Secondary | ICD-10-CM

## 2019-04-09 DIAGNOSIS — Z992 Dependence on renal dialysis: Secondary | ICD-10-CM

## 2019-04-10 DIAGNOSIS — N186 End stage renal disease: Secondary | ICD-10-CM | POA: Diagnosis not present

## 2019-04-10 DIAGNOSIS — D631 Anemia in chronic kidney disease: Secondary | ICD-10-CM | POA: Diagnosis not present

## 2019-04-10 DIAGNOSIS — N2581 Secondary hyperparathyroidism of renal origin: Secondary | ICD-10-CM | POA: Diagnosis not present

## 2019-04-10 DIAGNOSIS — D509 Iron deficiency anemia, unspecified: Secondary | ICD-10-CM | POA: Diagnosis not present

## 2019-04-10 DIAGNOSIS — Z992 Dependence on renal dialysis: Secondary | ICD-10-CM | POA: Diagnosis not present

## 2019-04-12 IMAGING — DX DG CHEST 2V
2 series · 2 of 2 positions shown · non-contrast
Comparison: PA and lateral chest 06/25/2018.  CT chest 11/17/2017.

CLINICAL DATA: Chest pain with coughing and deep inspiration for 3
days.

EXAM:
CHEST - 2 VIEW

[chest pa]
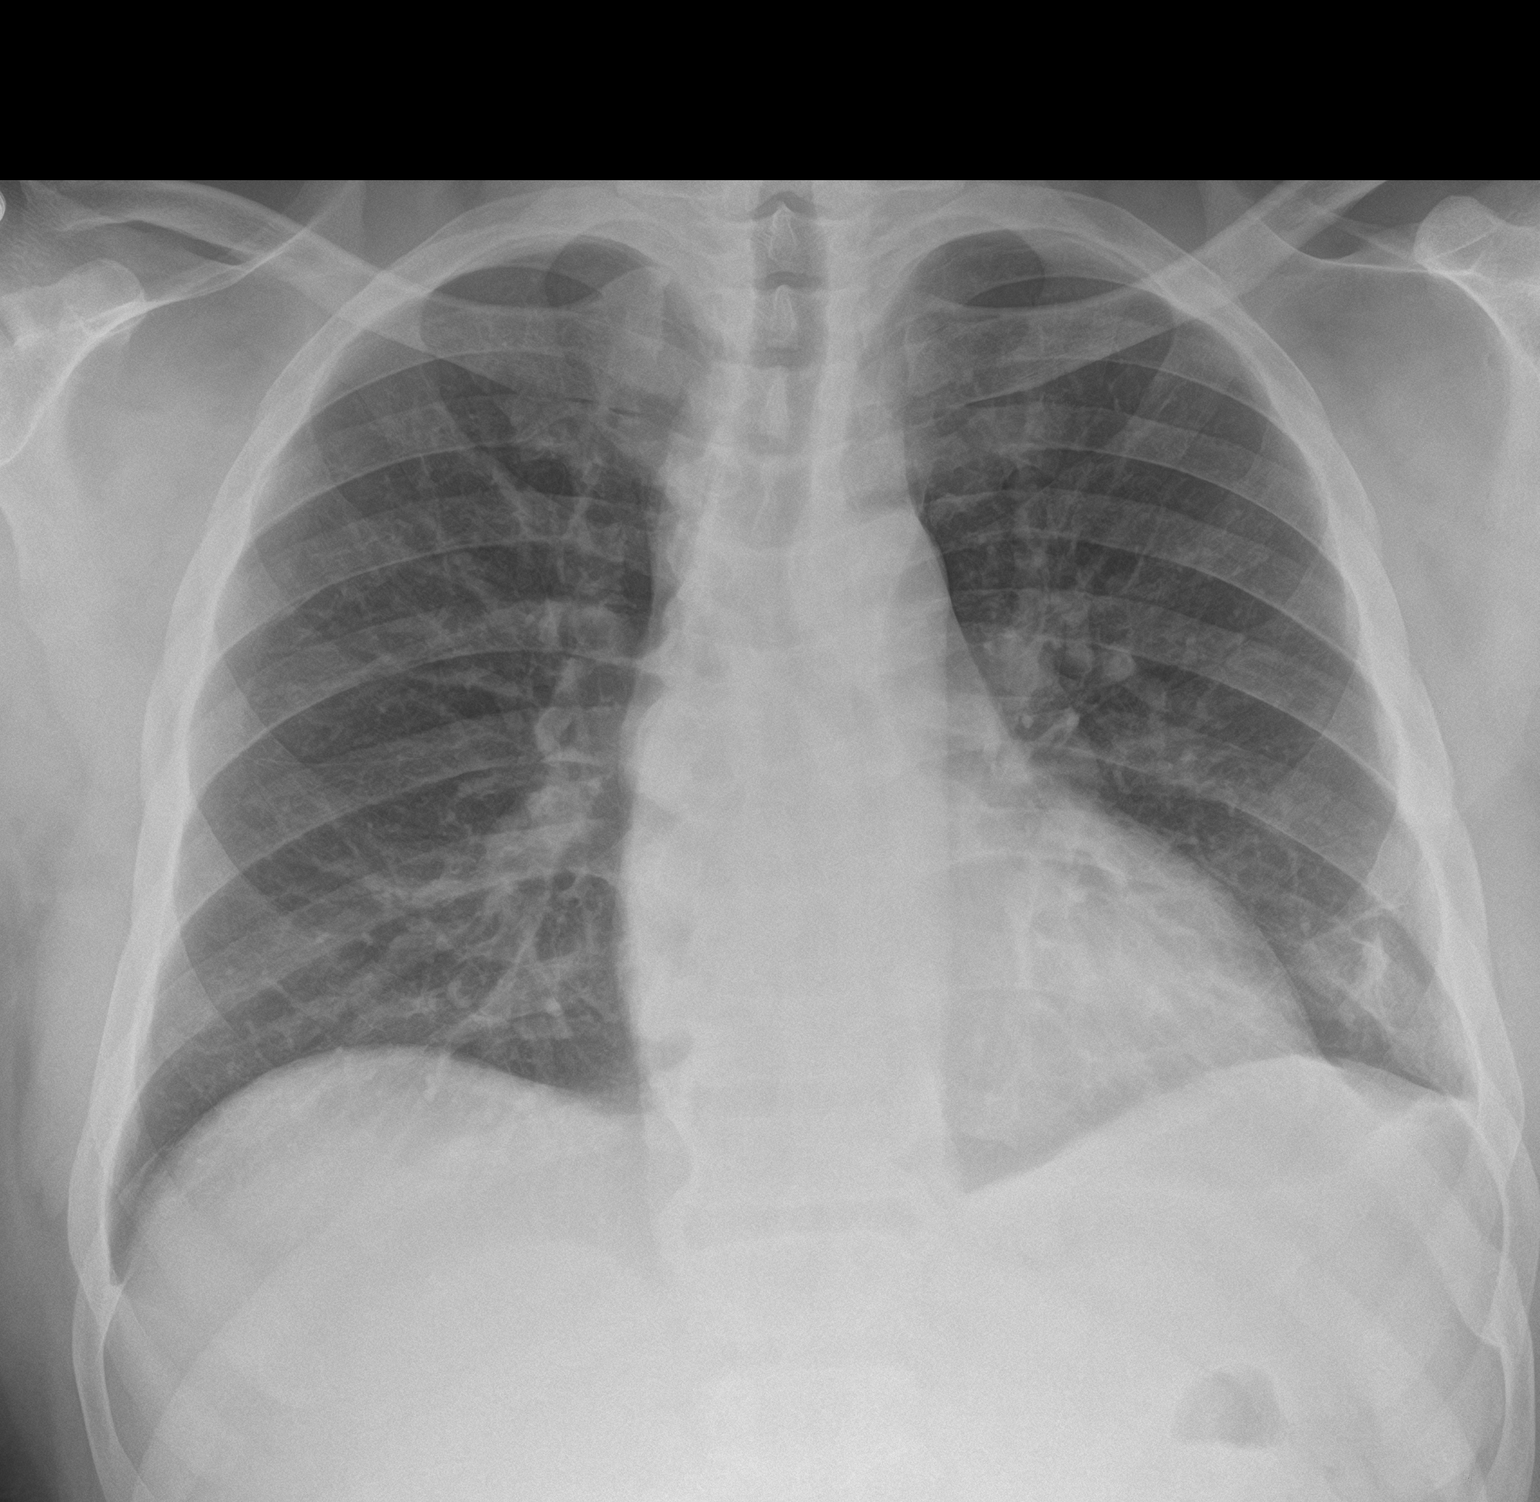

[chest lat]
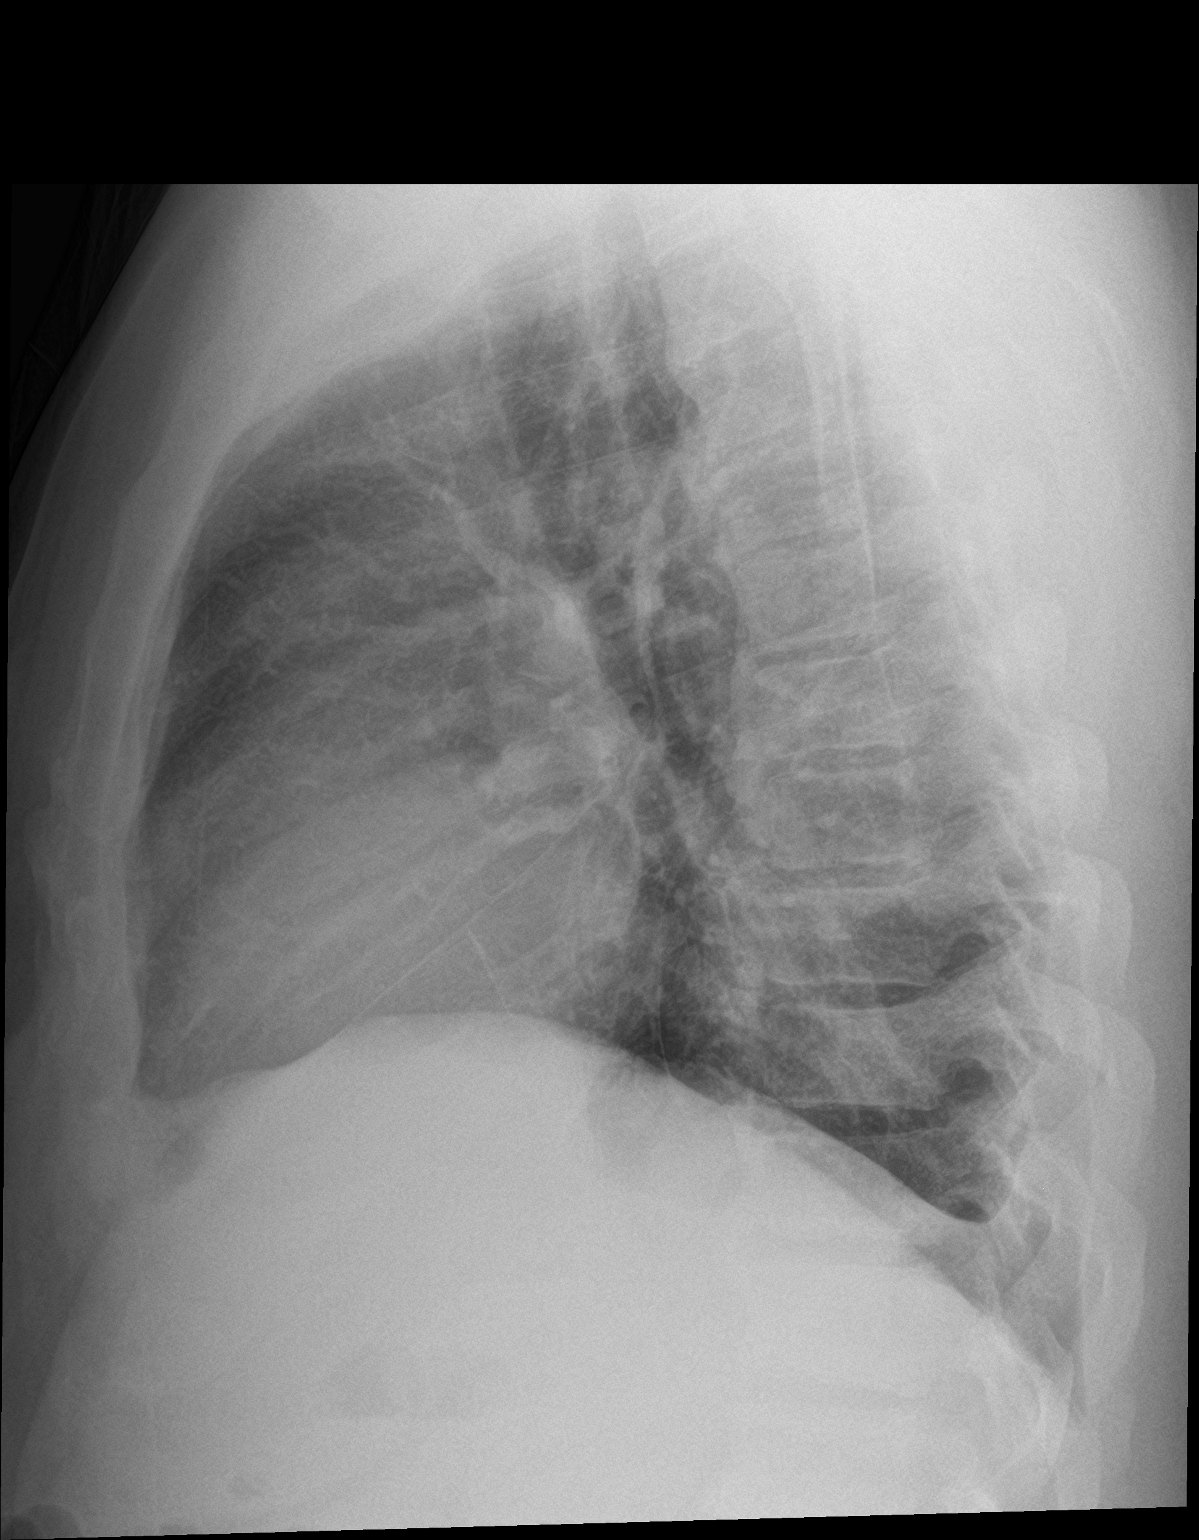

[2 of 2 positions shown; findings below may reference images not displayed]

FINDINGS: Small focus of scar in the left lung base is unchanged. The lungs
are otherwise clear. Mild cardiomegaly. No pneumothorax or pleural
effusion. No acute or focal bony abnormality.
IMPRESSION: No acute disease.

Mild cardiomegaly.

## 2019-04-13 ENCOUNTER — Other Ambulatory Visit: Payer: Self-pay | Admitting: *Deleted

## 2019-04-13 ENCOUNTER — Ambulatory Visit (HOSPITAL_COMMUNITY): Admission: RE | Admit: 2019-04-13 | Payer: Medicare Other | Source: Ambulatory Visit

## 2019-04-13 ENCOUNTER — Ambulatory Visit (INDEPENDENT_AMBULATORY_CARE_PROVIDER_SITE_OTHER): Payer: Medicare Other | Admitting: Surgery

## 2019-04-13 ENCOUNTER — Encounter: Payer: Self-pay | Admitting: Surgery

## 2019-04-13 ENCOUNTER — Encounter: Payer: Self-pay | Admitting: *Deleted

## 2019-04-13 ENCOUNTER — Other Ambulatory Visit: Payer: Self-pay

## 2019-04-13 ENCOUNTER — Ambulatory Visit (HOSPITAL_COMMUNITY)
Admission: RE | Admit: 2019-04-13 | Discharge: 2019-04-13 | Disposition: A | Discharge status: Home / Self Care | Payer: Medicare Other | Source: Ambulatory Visit | Attending: Surgery | Admitting: Surgery

## 2019-04-13 VITALS — BP 187/96 | HR 79 | Temp 97.2°F | Resp 20 | Ht 67.0 in | Wt 217.0 lb

## 2019-04-13 DIAGNOSIS — Z992 Dependence on renal dialysis: Secondary | ICD-10-CM | POA: Diagnosis not present

## 2019-04-13 DIAGNOSIS — N186 End stage renal disease: Secondary | ICD-10-CM

## 2019-04-13 MED ORDER — OXYCODONE-ACETAMINOPHEN 5-325 MG PO TABS: 1.0000 | ORAL_TABLET | ORAL | 0 refills | Status: DC | PRN

## 2019-04-13 NOTE — Progress Notes (Signed)
   Patient name: Alejandro Lee MRN: [id number] DOB: [birth year] Sex: male  REASON FOR VISIT:     post op  HISTORY OF PRESENT ILLNESS:   Alejandro Lee is a 58 y.o. male status post left BCF on 02-05-2019.  He is on HD via a right arm fistula with known right central vein occlusion.  He had branch ligation on 03-19-2019.  He also went to the ER last week with worsening pain and swelling  CURRENT MEDICATIONS:    Current Outpatient Medications  Medication Sig Dispense Refill  . acetaminophen (TYLENOL) 500 MG tablet Take 1,000 mg by mouth every 6 (six) hours as needed for moderate pain.    Marland Kitchen amLODipine (NORVASC) 5 MG tablet Take 1 tablet (5 mg total) by mouth daily. 30 tablet 2  . calcium acetate (PHOSLO) 667 MG capsule Take 2,001 mg by mouth 3 (three) times daily with meals.     Marland Kitchen HYDROcodone-acetaminophen (NORCO/VICODIN) 5-325 MG tablet Take 1 tablet by mouth every 4 (four) hours as needed. 10 tablet 0  . lidocaine-prilocaine (EMLA) cream Apply 1 application topically as needed (port acces).    Marland Kitchen loperamide (IMODIUM A-D) 2 MG tablet Take 2 mg by mouth 4 (four) times daily as needed for diarrhea or loose stools.    . sitaGLIPtin (JANUVIA) 25 MG tablet Take 25 mg by mouth daily.    . sorbitol 70 % solution Take 15 mLs by mouth daily as needed (constipation).     Marland Kitchen VITAMIN D PO Take 1 capsule by mouth 4 (four) times a week. Tues, Thurs, Sat, and Sun (non-dialysis days)     No current facility-administered medications for this visit.     REVIEW OF SYSTEMS:   [X]  denotes positive finding, [ ]  denotes negative finding Cardiac  Comments:  Chest pain or chest pressure:    Shortness of breath upon exertion:    Short of breath when lying flat:    Irregular heart rhythm:    Constitutional    Fever or chills:      PHYSICAL EXAM:   Vitals:   04/13/19 1047  BP: (!) 187/96  Pulse: 79  Resp: 20  Temp: (!) 97.2 F (36.2 C)  SpO2: 100%  Weight: 217 lb (98.4 kg)   Height: 5\' 7"  (1.702 m)    GENERAL: The patient is a well-nourished male, in no acute distress. The vital signs are documented above. CARDIOVASCULAR: There is a regular rate and rhythm. PULMONARY: Non-labored respirations Palpable thrill within fistula  STUDIES:   Duplex shows appropriate sized and depth left brachiocephalic fistula   MEDICAL ISSUES:   We discussed that I would like to wait until September 17 before cannulating his fistula.  If he is unable to wait that long, we could consider sticking his left brachiocephalic fistula early, or placing a femoral catheter.  He would like to try and wait.  I am therefore scheduling him for ligation of his right arm fistula on September 24.  Leia Alf, MD, FACS [facility] [phone] Pager [phone]

## 2019-04-13 NOTE — H&P (View-Only) (Signed)
   Patient name: Alejandro Lee MRN: [id number] DOB: 1961/04/17 Sex: male  REASON FOR VISIT:     post op  HISTORY OF PRESENT ILLNESS:   Alejandro Lee is a 58 y.o. male status post left BCF on 02-05-2019.  He is on HD via a right arm fistula with known right central vein occlusion.  He had branch ligation on 03-19-2019.  He also went to the ER last week with worsening pain and swelling  CURRENT MEDICATIONS:    Current Outpatient Medications  Medication Sig Dispense Refill  . acetaminophen (TYLENOL) 500 MG tablet Take 1,000 mg by mouth every 6 (six) hours as needed for moderate pain.    Marland Kitchen amLODipine (NORVASC) 5 MG tablet Take 1 tablet (5 mg total) by mouth daily. 30 tablet 2  . calcium acetate (PHOSLO) 667 MG capsule Take 2,001 mg by mouth 3 (three) times daily with meals.     Marland Kitchen HYDROcodone-acetaminophen (NORCO/VICODIN) 5-325 MG tablet Take 1 tablet by mouth every 4 (four) hours as needed. 10 tablet 0  . lidocaine-prilocaine (EMLA) cream Apply 1 application topically as needed (port acces).    Marland Kitchen loperamide (IMODIUM A-D) 2 MG tablet Take 2 mg by mouth 4 (four) times daily as needed for diarrhea or loose stools.    . sitaGLIPtin (JANUVIA) 25 MG tablet Take 25 mg by mouth daily.    . sorbitol 70 % solution Take 15 mLs by mouth daily as needed (constipation).     Marland Kitchen VITAMIN D PO Take 1 capsule by mouth 4 (four) times a week. Tues, Thurs, Sat, and Sun (non-dialysis days)     No current facility-administered medications for this visit.     REVIEW OF SYSTEMS:   [X]  denotes positive finding, [ ]  denotes negative finding Cardiac  Comments:  Chest pain or chest pressure:    Shortness of breath upon exertion:    Short of breath when lying flat:    Irregular heart rhythm:    Constitutional    Fever or chills:      PHYSICAL EXAM:   Vitals:   04/13/19 1047  BP: (!) 187/96  Pulse: 79  Resp: 20  Temp: (!) 97.2 F (36.2 C)  SpO2: 100%  Weight: 217 lb (98.4 kg)   Height: 5\' 7"  (1.702 m)    GENERAL: The patient is a well-nourished male, in no acute distress. The vital signs are documented above. CARDIOVASCULAR: There is a regular rate and rhythm. PULMONARY: Non-labored respirations Palpable thrill within fistula  STUDIES:   Duplex shows appropriate sized and depth left brachiocephalic fistula   MEDICAL ISSUES:   We discussed that I would like to wait until September 17 before cannulating his fistula.  If he is unable to wait that long, we could consider sticking his left brachiocephalic fistula early, or placing a femoral catheter.  He would like to try and wait.  I am therefore scheduling him for ligation of his right arm fistula on September 24.  Leia Alf, MD, FACS [facility] [phone] Pager [phone]

## 2019-04-15 DIAGNOSIS — D631 Anemia in chronic kidney disease: Secondary | ICD-10-CM | POA: Diagnosis not present

## 2019-04-15 DIAGNOSIS — Z992 Dependence on renal dialysis: Secondary | ICD-10-CM | POA: Diagnosis not present

## 2019-04-15 DIAGNOSIS — D509 Iron deficiency anemia, unspecified: Secondary | ICD-10-CM | POA: Diagnosis not present

## 2019-04-15 DIAGNOSIS — N2581 Secondary hyperparathyroidism of renal origin: Secondary | ICD-10-CM | POA: Diagnosis not present

## 2019-04-15 DIAGNOSIS — N186 End stage renal disease: Secondary | ICD-10-CM | POA: Diagnosis not present

## 2019-04-15 IMAGING — DX DG CHEST 2V
2 series · 2 of 2 positions shown · non-contrast
Comparison: 10/09/2018

CLINICAL DATA: Shortness of breath

EXAM:
CHEST - 2 VIEW

[chest pa]
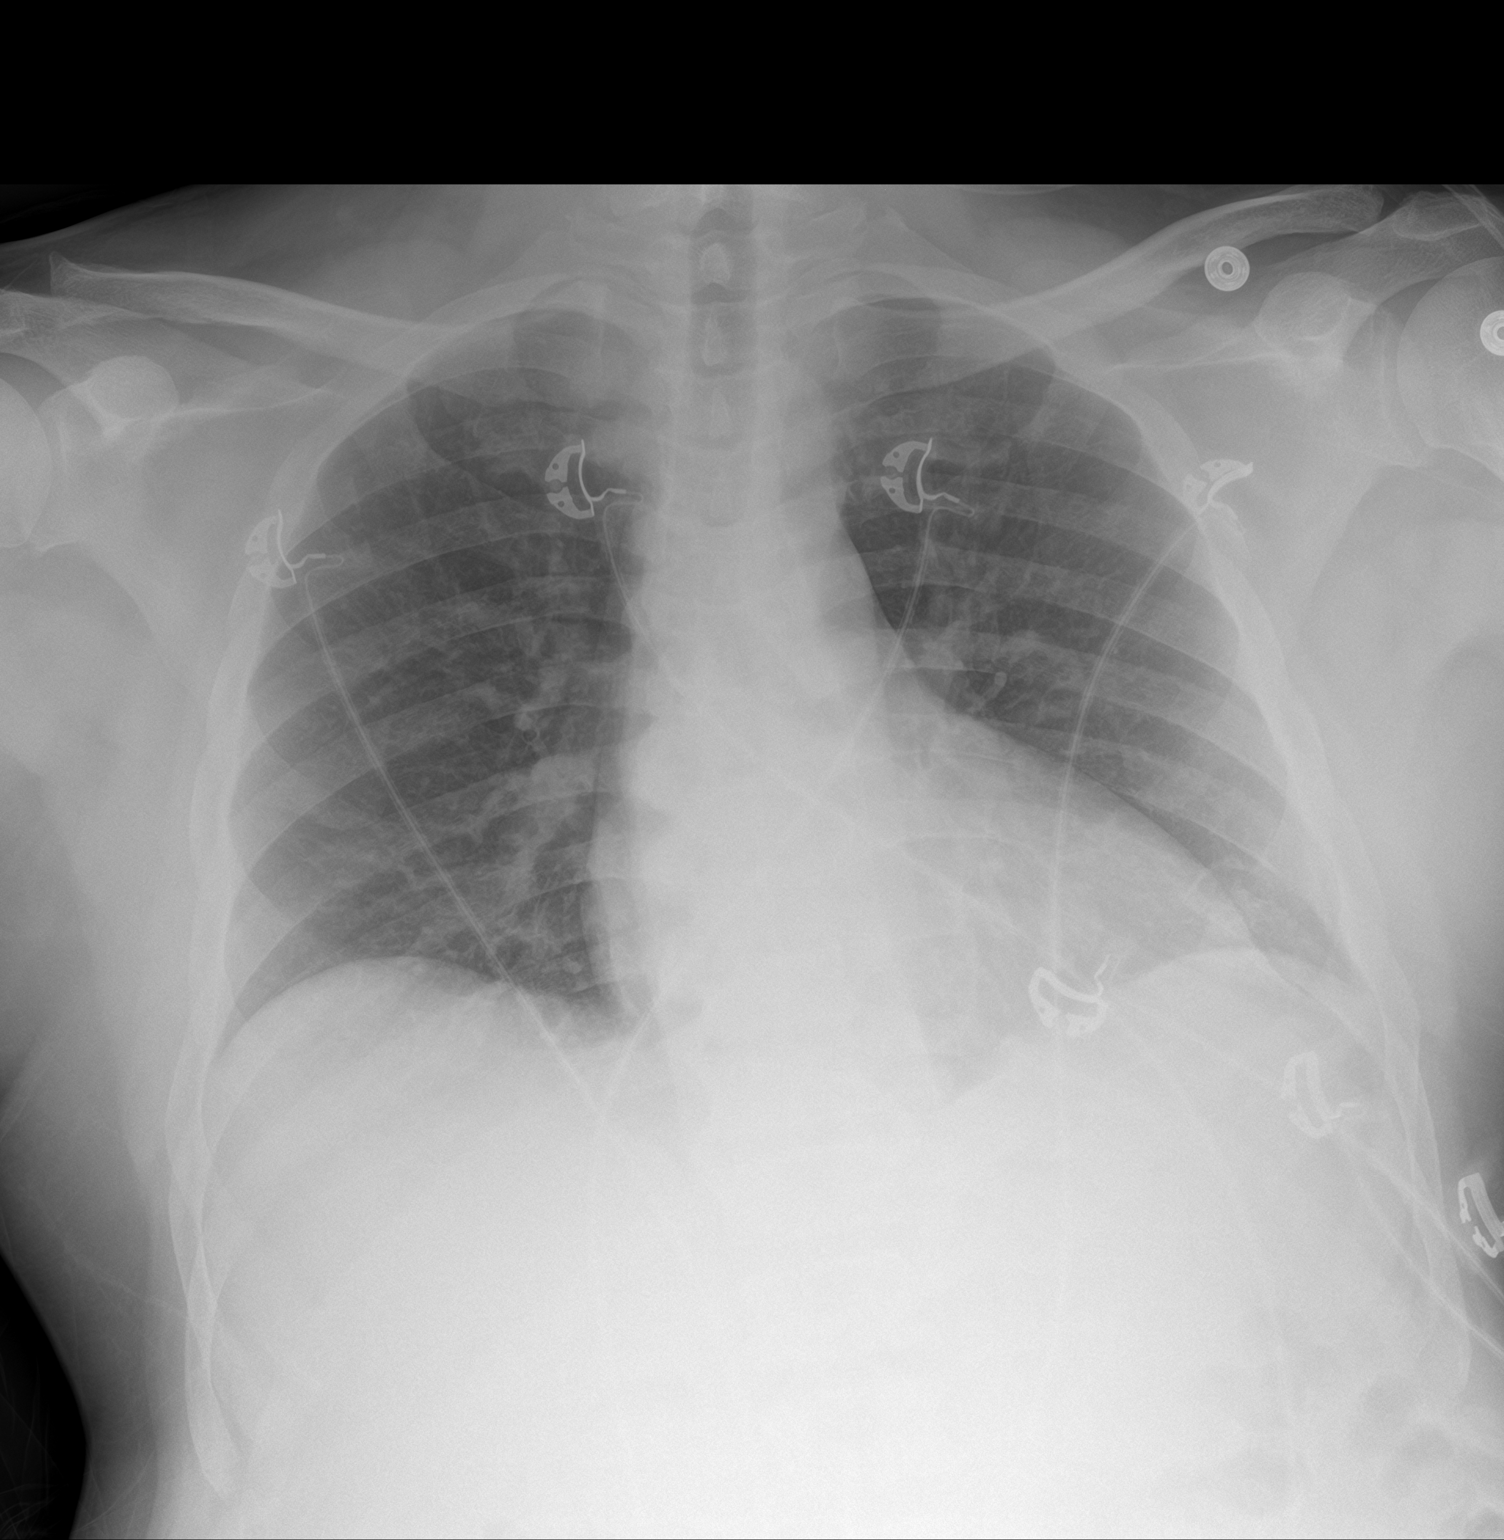

[chest lat]
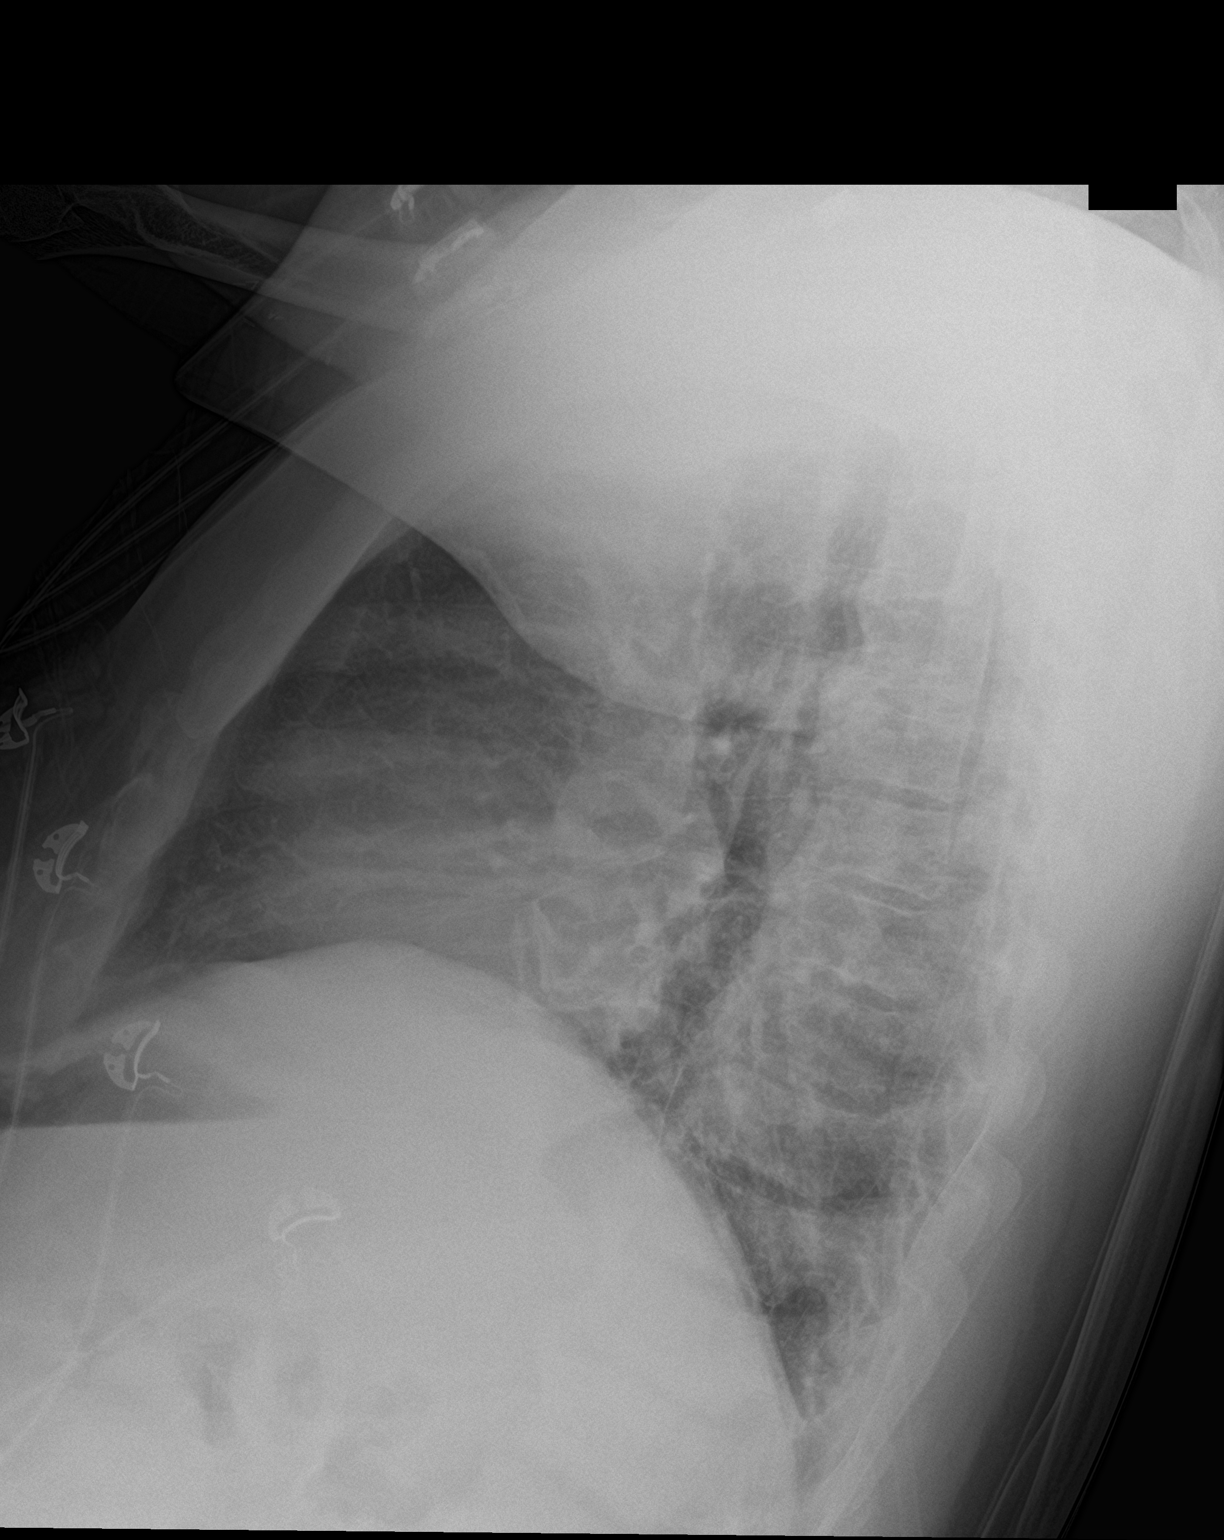

[2 of 2 positions shown; findings below may reference images not displayed]

FINDINGS: Cardiac shadow is stable. The lungs are well aerated bilaterally.
Stable scarring in the left base is noted. No bony abnormality is
seen.
IMPRESSION: No acute abnormality seen.

## 2019-04-17 DIAGNOSIS — N2581 Secondary hyperparathyroidism of renal origin: Secondary | ICD-10-CM | POA: Diagnosis not present

## 2019-04-17 DIAGNOSIS — N186 End stage renal disease: Secondary | ICD-10-CM | POA: Diagnosis not present

## 2019-04-17 DIAGNOSIS — D631 Anemia in chronic kidney disease: Secondary | ICD-10-CM | POA: Diagnosis not present

## 2019-04-17 DIAGNOSIS — D509 Iron deficiency anemia, unspecified: Secondary | ICD-10-CM | POA: Diagnosis not present

## 2019-04-17 DIAGNOSIS — Z992 Dependence on renal dialysis: Secondary | ICD-10-CM | POA: Diagnosis not present

## 2019-04-20 DIAGNOSIS — D509 Iron deficiency anemia, unspecified: Secondary | ICD-10-CM | POA: Diagnosis not present

## 2019-04-20 DIAGNOSIS — D631 Anemia in chronic kidney disease: Secondary | ICD-10-CM | POA: Diagnosis not present

## 2019-04-20 DIAGNOSIS — N2581 Secondary hyperparathyroidism of renal origin: Secondary | ICD-10-CM | POA: Diagnosis not present

## 2019-04-20 DIAGNOSIS — N186 End stage renal disease: Secondary | ICD-10-CM | POA: Diagnosis not present

## 2019-04-20 DIAGNOSIS — Z992 Dependence on renal dialysis: Secondary | ICD-10-CM | POA: Diagnosis not present

## 2019-04-22 DIAGNOSIS — N186 End stage renal disease: Secondary | ICD-10-CM | POA: Diagnosis not present

## 2019-04-22 DIAGNOSIS — D509 Iron deficiency anemia, unspecified: Secondary | ICD-10-CM | POA: Diagnosis not present

## 2019-04-22 DIAGNOSIS — Z992 Dependence on renal dialysis: Secondary | ICD-10-CM | POA: Diagnosis not present

## 2019-04-22 DIAGNOSIS — N2581 Secondary hyperparathyroidism of renal origin: Secondary | ICD-10-CM | POA: Diagnosis not present

## 2019-04-22 DIAGNOSIS — D631 Anemia in chronic kidney disease: Secondary | ICD-10-CM | POA: Diagnosis not present

## 2019-04-24 DIAGNOSIS — N186 End stage renal disease: Secondary | ICD-10-CM | POA: Diagnosis not present

## 2019-04-24 DIAGNOSIS — N2581 Secondary hyperparathyroidism of renal origin: Secondary | ICD-10-CM | POA: Diagnosis not present

## 2019-04-24 DIAGNOSIS — D509 Iron deficiency anemia, unspecified: Secondary | ICD-10-CM | POA: Diagnosis not present

## 2019-04-24 DIAGNOSIS — D631 Anemia in chronic kidney disease: Secondary | ICD-10-CM | POA: Diagnosis not present

## 2019-04-24 DIAGNOSIS — Z992 Dependence on renal dialysis: Secondary | ICD-10-CM | POA: Diagnosis not present

## 2019-04-27 DIAGNOSIS — N2581 Secondary hyperparathyroidism of renal origin: Secondary | ICD-10-CM | POA: Diagnosis not present

## 2019-04-27 DIAGNOSIS — Z992 Dependence on renal dialysis: Secondary | ICD-10-CM | POA: Diagnosis not present

## 2019-04-27 DIAGNOSIS — D631 Anemia in chronic kidney disease: Secondary | ICD-10-CM | POA: Diagnosis not present

## 2019-04-27 DIAGNOSIS — D509 Iron deficiency anemia, unspecified: Secondary | ICD-10-CM | POA: Diagnosis not present

## 2019-04-27 DIAGNOSIS — N186 End stage renal disease: Secondary | ICD-10-CM | POA: Diagnosis not present

## 2019-04-29 DIAGNOSIS — D509 Iron deficiency anemia, unspecified: Secondary | ICD-10-CM | POA: Diagnosis not present

## 2019-04-29 DIAGNOSIS — D631 Anemia in chronic kidney disease: Secondary | ICD-10-CM | POA: Diagnosis not present

## 2019-04-29 DIAGNOSIS — N186 End stage renal disease: Secondary | ICD-10-CM | POA: Diagnosis not present

## 2019-04-29 DIAGNOSIS — Z992 Dependence on renal dialysis: Secondary | ICD-10-CM | POA: Diagnosis not present

## 2019-04-29 DIAGNOSIS — N2581 Secondary hyperparathyroidism of renal origin: Secondary | ICD-10-CM | POA: Diagnosis not present

## 2019-05-01 DIAGNOSIS — D631 Anemia in chronic kidney disease: Secondary | ICD-10-CM | POA: Diagnosis not present

## 2019-05-01 DIAGNOSIS — N2581 Secondary hyperparathyroidism of renal origin: Secondary | ICD-10-CM | POA: Diagnosis not present

## 2019-05-01 DIAGNOSIS — N186 End stage renal disease: Secondary | ICD-10-CM | POA: Diagnosis not present

## 2019-05-01 DIAGNOSIS — D509 Iron deficiency anemia, unspecified: Secondary | ICD-10-CM | POA: Diagnosis not present

## 2019-05-01 DIAGNOSIS — Z992 Dependence on renal dialysis: Secondary | ICD-10-CM | POA: Diagnosis not present

## 2019-05-04 ENCOUNTER — Ambulatory Visit: Payer: Medicare Other | Admitting: Cardiovascular Disease

## 2019-05-04 DIAGNOSIS — D631 Anemia in chronic kidney disease: Secondary | ICD-10-CM | POA: Diagnosis not present

## 2019-05-04 DIAGNOSIS — Z992 Dependence on renal dialysis: Secondary | ICD-10-CM | POA: Diagnosis not present

## 2019-05-04 DIAGNOSIS — N186 End stage renal disease: Secondary | ICD-10-CM | POA: Diagnosis not present

## 2019-05-04 DIAGNOSIS — D509 Iron deficiency anemia, unspecified: Secondary | ICD-10-CM | POA: Diagnosis not present

## 2019-05-04 DIAGNOSIS — N2581 Secondary hyperparathyroidism of renal origin: Secondary | ICD-10-CM | POA: Diagnosis not present

## 2019-05-05 ENCOUNTER — Other Ambulatory Visit (HOSPITAL_COMMUNITY)
Admission: RE | Admit: 2019-05-05 | Discharge: 2019-05-05 | Disposition: A | Discharge status: Home / Self Care | Payer: Medicare Other | Source: Ambulatory Visit | Attending: Surgery | Admitting: Surgery

## 2019-05-06 ENCOUNTER — Encounter (HOSPITAL_COMMUNITY): Payer: Self-pay | Admitting: *Deleted

## 2019-05-06 ENCOUNTER — Other Ambulatory Visit: Payer: Self-pay

## 2019-05-06 DIAGNOSIS — Z992 Dependence on renal dialysis: Secondary | ICD-10-CM | POA: Diagnosis not present

## 2019-05-06 DIAGNOSIS — N2581 Secondary hyperparathyroidism of renal origin: Secondary | ICD-10-CM | POA: Diagnosis not present

## 2019-05-06 DIAGNOSIS — D631 Anemia in chronic kidney disease: Secondary | ICD-10-CM | POA: Diagnosis not present

## 2019-05-06 DIAGNOSIS — D509 Iron deficiency anemia, unspecified: Secondary | ICD-10-CM | POA: Diagnosis not present

## 2019-05-06 DIAGNOSIS — N186 End stage renal disease: Secondary | ICD-10-CM | POA: Diagnosis not present

## 2019-05-06 NOTE — Progress Notes (Signed)
SDW-Pre-op call was completed by pt spouse,  Mariann Laster (DPR). Spouse denies that pt C/O SOB and chest pain. Spouse denies that pt is under the care of a cardiologist. Spouse denies that pt had a cardiac cath. Spouse stated that pt PCP is Dr. Brigitte Pulse at [facility]. Spouse made aware to have pt stop taking vitamins, fish oil and herbal medications. Do not take any NSAIDs ie: Ibuprofen, Advil, Naproxen (Aleve), Motrin, BC and Goody Powder. Spouse made aware to have pt hold Januvia on DOS. Spouse stated that pt does not have a glucometer to check his CBG. Spouse verbalized understanding of all pre-op instructions.

## 2019-05-06 NOTE — Progress Notes (Signed)
   05/06/19 1725  OBSTRUCTIVE SLEEP APNEA  Have you ever been diagnosed with sleep apnea through a sleep study? No  Do you snore loudly (loud enough to be heard through closed doors)?  1  Do you often feel tired, fatigued, or sleepy during the daytime (such as falling asleep during driving or talking to someone)? 0  Has anyone observed you stop breathing during your sleep? 1  Do you have, or are you being treated for high blood pressure? 1  BMI more than 35 kg/m2? 1  Age > 50 (1-yes) 1  Neck circumference greater than:Male 16 inches or larger, Male 17inches or larger?  (assess dos)  Male Gender (Yes=1) 1  Obstructive Sleep Apnea Score 6

## 2019-05-07 ENCOUNTER — Ambulatory Visit (HOSPITAL_COMMUNITY): Payer: Medicare Other | Admitting: Anesthesiology

## 2019-05-07 ENCOUNTER — Ambulatory Visit (HOSPITAL_COMMUNITY)
Admission: RE | Admit: 2019-05-07 | Discharge: 2019-05-07 | Disposition: A | Discharge status: Home / Self Care | Payer: Medicare Other | Attending: Surgery | Admitting: Surgery

## 2019-05-07 ENCOUNTER — Other Ambulatory Visit: Payer: Self-pay

## 2019-05-07 ENCOUNTER — Encounter (HOSPITAL_COMMUNITY): Payer: Self-pay

## 2019-05-07 ENCOUNTER — Encounter (HOSPITAL_COMMUNITY)
Admission: RE | Disposition: A | Discharge status: Home / Self Care | Payer: Self-pay | Source: Home / Self Care | Attending: Surgery

## 2019-05-07 DIAGNOSIS — Y832 Surgical operation with anastomosis, bypass or graft as the cause of abnormal reaction of the patient, or of later complication, without mention of misadventure at the time of the procedure: Secondary | ICD-10-CM | POA: Diagnosis not present

## 2019-05-07 DIAGNOSIS — Z992 Dependence on renal dialysis: Secondary | ICD-10-CM | POA: Insufficient documentation

## 2019-05-07 DIAGNOSIS — Z20828 Contact with and (suspected) exposure to other viral communicable diseases: Secondary | ICD-10-CM | POA: Diagnosis not present

## 2019-05-07 DIAGNOSIS — Z87891 Personal history of nicotine dependence: Secondary | ICD-10-CM | POA: Diagnosis not present

## 2019-05-07 DIAGNOSIS — D631 Anemia in chronic kidney disease: Secondary | ICD-10-CM | POA: Diagnosis not present

## 2019-05-07 DIAGNOSIS — M1990 Unspecified osteoarthritis, unspecified site: Secondary | ICD-10-CM | POA: Insufficient documentation

## 2019-05-07 DIAGNOSIS — I132 Hypertensive heart and chronic kidney disease with heart failure and with stage 5 chronic kidney disease, or end stage renal disease: Secondary | ICD-10-CM | POA: Insufficient documentation

## 2019-05-07 DIAGNOSIS — N186 End stage renal disease: Secondary | ICD-10-CM | POA: Diagnosis not present

## 2019-05-07 DIAGNOSIS — Z79899 Other long term (current) drug therapy: Secondary | ICD-10-CM | POA: Insufficient documentation

## 2019-05-07 DIAGNOSIS — E1122 Type 2 diabetes mellitus with diabetic chronic kidney disease: Secondary | ICD-10-CM | POA: Insufficient documentation

## 2019-05-07 DIAGNOSIS — I509 Heart failure, unspecified: Secondary | ICD-10-CM | POA: Insufficient documentation

## 2019-05-07 DIAGNOSIS — Z6832 Body mass index (BMI) 32.0-32.9, adult: Secondary | ICD-10-CM | POA: Diagnosis not present

## 2019-05-07 DIAGNOSIS — T82868A Thrombosis of vascular prosthetic devices, implants and grafts, initial encounter: Secondary | ICD-10-CM | POA: Diagnosis not present

## 2019-05-07 DIAGNOSIS — Z7984 Long term (current) use of oral hypoglycemic drugs: Secondary | ICD-10-CM | POA: Diagnosis not present

## 2019-05-07 DIAGNOSIS — I252 Old myocardial infarction: Secondary | ICD-10-CM | POA: Diagnosis not present

## 2019-05-07 DIAGNOSIS — T82898A Other specified complication of vascular prosthetic devices, implants and grafts, initial encounter: Secondary | ICD-10-CM | POA: Diagnosis not present

## 2019-05-07 HISTORY — PX: LIGATION OF ARTERIOVENOUS  FISTULA: SHX5948

## 2019-05-07 LAB — POCT I-STAT, CHEM 8
BUN: 59 mg/dL — ABNORMAL HIGH (ref 6–20)
Calcium, Ion: 1.12 mmol/L — ABNORMAL LOW (ref 1.15–1.40)
Chloride: 99 mmol/L (ref 98–111)
Creatinine, Ser: 13.8 mg/dL — ABNORMAL HIGH (ref 0.61–1.24)
Glucose, Bld: 176 mg/dL — ABNORMAL HIGH (ref 70–99)
HCT: 38 % — ABNORMAL LOW (ref 39.0–52.0)
Hemoglobin: 12.9 g/dL — ABNORMAL LOW (ref 13.0–17.0)
Potassium: 4.4 mmol/L (ref 3.5–5.1)
Sodium: 137 mmol/L (ref 135–145)
TCO2: 28 mmol/L (ref 22–32)

## 2019-05-07 LAB — GLUCOSE, CAPILLARY: Glucose-Capillary: 170 mg/dL — ABNORMAL HIGH (ref 70–99)

## 2019-05-07 LAB — SARS CORONAVIRUS 2 BY RT PCR (HOSPITAL ORDER, PERFORMED IN ~~LOC~~ HOSPITAL LAB): SARS Coronavirus 2: NEGATIVE

## 2019-05-07 SURGERY — LIGATION OF ARTERIOVENOUS  FISTULA
Anesthesia: General | Site: Arm Upper | Laterality: Right

## 2019-05-07 MED ORDER — LIDOCAINE-EPINEPHRINE (PF) 1 %-1:200000 IJ SOLN
INTRAMUSCULAR | Status: DC | PRN
  Administered 2019-05-07: 2 mL

## 2019-05-07 MED ORDER — MIDAZOLAM HCL 2 MG/2ML IJ SOLN
INTRAMUSCULAR | Status: DC | PRN
  Administered 2019-05-07: 2 mg via INTRAVENOUS

## 2019-05-07 MED ORDER — MIDAZOLAM HCL 2 MG/2ML IJ SOLN
INTRAMUSCULAR | Status: AC
  Filled 2019-05-07: qty 2

## 2019-05-07 MED ORDER — SODIUM CHLORIDE 0.9 % IV SOLN
INTRAVENOUS | Status: AC
  Filled 2019-05-07: qty 1.2

## 2019-05-07 MED ORDER — CHLORHEXIDINE GLUCONATE 4 % EX LIQD: 60.0000 mL | Freq: Once | CUTANEOUS | Status: DC

## 2019-05-07 MED ORDER — CEFAZOLIN SODIUM-DEXTROSE 2-4 GM/100ML-% IV SOLN
INTRAVENOUS | Status: AC
  Filled 2019-05-07: qty 100

## 2019-05-07 MED ORDER — HYDROCODONE-ACETAMINOPHEN 5-325 MG PO TABS: 1.0000 | ORAL_TABLET | Freq: Four times a day (QID) | ORAL | 0 refills | Status: DC | PRN

## 2019-05-07 MED ORDER — HYDROMORPHONE HCL 1 MG/ML IJ SOLN: 0.2500 mg | INTRAMUSCULAR | Status: DC | PRN

## 2019-05-07 MED ORDER — FENTANYL CITRATE (PF) 250 MCG/5ML IJ SOLN
INTRAMUSCULAR | Status: AC
  Filled 2019-05-07: qty 5

## 2019-05-07 MED ORDER — OXYCODONE HCL 5 MG/5ML PO SOLN: 5.0000 mg | Freq: Once | ORAL | Status: DC | PRN

## 2019-05-07 MED ORDER — 0.9 % SODIUM CHLORIDE (POUR BTL) OPTIME
TOPICAL | Status: DC | PRN
  Administered 2019-05-07: 09:00:00 1000 mL

## 2019-05-07 MED ORDER — CEFAZOLIN SODIUM-DEXTROSE 2-4 GM/100ML-% IV SOLN
2.0000 g | INTRAVENOUS | Status: AC
  Administered 2019-05-07: 2 g via INTRAVENOUS

## 2019-05-07 MED ORDER — PROMETHAZINE HCL 25 MG/ML IJ SOLN: 6.2500 mg | INTRAMUSCULAR | Status: DC | PRN

## 2019-05-07 MED ORDER — ONDANSETRON HCL 4 MG/2ML IJ SOLN
INTRAMUSCULAR | Status: DC | PRN
  Administered 2019-05-07: 4 mg via INTRAVENOUS

## 2019-05-07 MED ORDER — OXYCODONE HCL 5 MG PO TABS: 5.0000 mg | ORAL_TABLET | Freq: Once | ORAL | Status: DC | PRN

## 2019-05-07 MED ORDER — SODIUM CHLORIDE 0.9 % IV SOLN
INTRAVENOUS | Status: DC | PRN
  Administered 2019-05-07: 09:00:00 20 ug/min via INTRAVENOUS

## 2019-05-07 MED ORDER — LIDOCAINE-EPINEPHRINE (PF) 1 %-1:200000 IJ SOLN
INTRAMUSCULAR | Status: AC
  Filled 2019-05-07: qty 30

## 2019-05-07 MED ORDER — PROPOFOL 500 MG/50ML IV EMUL
INTRAVENOUS | Status: DC | PRN
  Administered 2019-05-07: 100 ug/kg/min via INTRAVENOUS

## 2019-05-07 MED ORDER — FENTANYL CITRATE (PF) 100 MCG/2ML IJ SOLN
INTRAMUSCULAR | Status: DC | PRN
  Administered 2019-05-07: 25 ug via INTRAVENOUS

## 2019-05-07 MED ORDER — SODIUM CHLORIDE 0.9 % IV SOLN
INTRAVENOUS | Status: DC
  Administered 2019-05-07: 09:00:00 via INTRAVENOUS

## 2019-05-07 SURGICAL SUPPLY — 34 items
BNDG ELASTIC 4X5.8 VLCR STR LF (GAUZE/BANDAGES/DRESSINGS) ×3 IMPLANT
CANISTER SUCT 3000ML PPV (MISCELLANEOUS) ×3 IMPLANT
CLIP VESOCCLUDE MED 6/CT (CLIP) ×3 IMPLANT
CLIP VESOCCLUDE SM WIDE 6/CT (CLIP) ×3 IMPLANT
COVER PROBE W GEL 5X96 (DRAPES) ×3 IMPLANT
COVER WAND RF STERILE (DRAPES) ×3 IMPLANT
DERMABOND ADVANCED (GAUZE/BANDAGES/DRESSINGS) ×2
DERMABOND ADVANCED .7 DNX12 (GAUZE/BANDAGES/DRESSINGS) ×1 IMPLANT
ELECT REM PT RETURN 9FT ADLT (ELECTROSURGICAL) ×3
ELECTRODE REM PT RTRN 9FT ADLT (ELECTROSURGICAL) ×1 IMPLANT
GLOVE BIOGEL M STRL SZ7.5 (GLOVE) ×3 IMPLANT
GLOVE BIOGEL PI IND STRL 7.5 (GLOVE) ×1 IMPLANT
GLOVE BIOGEL PI INDICATOR 7.5 (GLOVE) ×2
GLOVE SURG SS PI 7.5 STRL IVOR (GLOVE) ×3 IMPLANT
GOWN STRL REUS W/ TWL LRG LVL3 (GOWN DISPOSABLE) ×2 IMPLANT
GOWN STRL REUS W/ TWL XL LVL3 (GOWN DISPOSABLE) ×1 IMPLANT
GOWN STRL REUS W/TWL LRG LVL3 (GOWN DISPOSABLE) ×4
GOWN STRL REUS W/TWL XL LVL3 (GOWN DISPOSABLE) ×2
HEMOSTAT SNOW SURGICEL 2X4 (HEMOSTASIS) IMPLANT
KIT BASIN OR (CUSTOM PROCEDURE TRAY) ×3 IMPLANT
KIT TURNOVER KIT B (KITS) ×3 IMPLANT
NS IRRIG 1000ML POUR BTL (IV SOLUTION) ×3 IMPLANT
PACK CV ACCESS (CUSTOM PROCEDURE TRAY) ×3 IMPLANT
PAD ARMBOARD 7.5X6 YLW CONV (MISCELLANEOUS) ×6 IMPLANT
SUT ETHILON 3 0 PS 1 (SUTURE) IMPLANT
SUT PROLENE 5 0 C 1 24 (SUTURE) ×6 IMPLANT
SUT PROLENE 6 0 BV (SUTURE) IMPLANT
SUT SILK 0 TIES 10X30 (SUTURE) IMPLANT
SUT VIC AB 3-0 SH 27 (SUTURE) ×2
SUT VIC AB 3-0 SH 27X BRD (SUTURE) ×1 IMPLANT
SUT VICRYL 4-0 PS2 18IN ABS (SUTURE) IMPLANT
TOWEL GREEN STERILE (TOWEL DISPOSABLE) ×3 IMPLANT
UNDERPAD 30X30 (UNDERPADS AND DIAPERS) ×3 IMPLANT
WATER STERILE IRR 1000ML POUR (IV SOLUTION) ×3 IMPLANT

## 2019-05-07 NOTE — Anesthesia Postprocedure Evaluation (Signed)
Anesthesia Post Note  Patient: Alejandro Lee  Procedure(s) Performed: LIGATION OF ARTERIOVENOUS  FISTULA RIGHT ARM (Right Arm Upper)     Patient location during evaluation: PACU Anesthesia Type: General Level of consciousness: awake and alert Pain management: pain level controlled Vital Signs Assessment: post-procedure vital signs reviewed and stable Respiratory status: spontaneous breathing, nonlabored ventilation and respiratory function stable Cardiovascular status: blood pressure returned to baseline and stable Postop Assessment: no apparent nausea or vomiting Anesthetic complications: no    Last Vitals:  Vitals:   05/07/19 1030 05/07/19 1100  BP: (!) 153/71 (!) 170/63  Pulse: 82 83  Resp: 19 16  Temp:    SpO2: 100% 100%    Last Pain:  Vitals:   05/07/19 1100  TempSrc:   PainSc: 0-No pain                 Lynda Rainwater

## 2019-05-07 NOTE — Interval H&P Note (Signed)
History and Physical Interval Note:  05/07/2019 7:36 AM  Alejandro Lee  has presented today for surgery, with the diagnosis of END STAGE RENAL DISEASE.  The various methods of treatment have been discussed with the patient and family. After consideration of risks, benefits and other options for treatment, the patient has consented to  Procedure(s): LIGATION OF ARTERIOVENOUS  FISTULA RIGHT ARM (Right) as a surgical intervention.  The patient's history has been reviewed, patient examined, no change in status, stable for surgery.  I have reviewed the patient's chart and labs.  Questions were answered to the patient's satisfaction.     Annamarie Major

## 2019-05-07 NOTE — Anesthesia Preprocedure Evaluation (Signed)
Anesthesia Evaluation  Patient identified by MRN, date of birth, ID band Patient awake    Reviewed: Allergy & Precautions, NPO status , Patient's Chart, lab work & pertinent test results  History of Anesthesia Complications Negative for: history of anesthetic complications  Airway Mallampati: II  TM Distance: >3 FB Neck ROM: Full    Dental  (+) Dental Advisory Given   Pulmonary neg shortness of breath, neg sleep apnea, neg COPD, neg recent URI, former smoker,    breath sounds clear to auscultation       Cardiovascular hypertension, Pt. on medications (-) angina+ Past MI and +CHF   Rhythm:Regular     Neuro/Psych  Headaches, neg Seizures negative psych ROS   GI/Hepatic Neg liver ROS, GERD  ,  Endo/Other  diabetesMorbid obesity  Renal/GU ESRF and DialysisRenal disease     Musculoskeletal  (+) Arthritis ,   Abdominal   Peds  Hematology  (+) anemia ,   Anesthesia Other Findings   Reproductive/Obstetrics                             Anesthesia Physical  Anesthesia Plan  ASA: IV  Anesthesia Plan: General   Post-op Pain Management:    Induction: Intravenous  PONV Risk Score and Plan: 2 and Treatment may vary due to age or medical condition, Ondansetron and Midazolam  Airway Management Planned: LMA  Additional Equipment: None  Intra-op Plan:   Post-operative Plan:   Informed Consent: I have reviewed the patients History and Physical, chart, labs and discussed the procedure including the risks, benefits and alternatives for the proposed anesthesia with the patient or authorized representative who has indicated his/her understanding and acceptance.     Dental advisory given  Plan Discussed with: CRNA and Surgeon  Anesthesia Plan Comments:         Anesthesia Quick Evaluation

## 2019-05-07 NOTE — Transfer of Care (Signed)
Immediate Anesthesia Transfer of Care Note  Patient: Alejandro Lee  Procedure(s) Performed: LIGATION OF ARTERIOVENOUS  FISTULA RIGHT ARM (Right Arm Upper)  Patient Location: PACU  Anesthesia Type:General  Level of Consciousness: drowsy and patient cooperative  Airway & Oxygen Therapy: Patient Spontanous Breathing  Post-op Assessment: Report given to RN, Post -op Vital signs reviewed and stable and Patient moving all extremities X 4  Post vital signs: Reviewed and stable  Last Vitals:  Vitals Value Taken Time  BP 132/65 05/07/19 1015  Temp 36.4 C 05/07/19 1015  Pulse 89 05/07/19 1020  Resp 31 05/07/19 1020  SpO2 92 % 05/07/19 1020  Vitals shown include unvalidated device data.  Last Pain:  Vitals:   05/07/19 1015  TempSrc:   PainSc: 0-No pain      Patients Stated Pain Goal: 2 (0000000 [id number])  Complications: No apparent anesthesia complications

## 2019-05-07 NOTE — Op Note (Signed)
    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  He has been using a right arm fistula until his left arm fistula has matured.  Procedure:  The patient was identified in the holding area and taken to [place] 11  The patient was then placed supine on the table. MAC anesthesia was administered.  The patient was prepped and draped in the usual sterile fashion.  A time out was called and antibiotics were administered.  1% lidocaine was used for local anesthesia.  The previous longitudinal upper arm incision was opened with a 10 blade.  Using cautery and sharp dissection was used to circumferentially isolate the fistula.  Once it was exposed , it was occluded with vascular clamps.  The fistula was then divided and each end was oversewn with 5-0 prolene.  There was a brisk radial artery signal.  Hemostasis was achieved.  The incision was closed with 2 layers of vicryl and dermabond.  The arm was wrapped with an ACE bandage.   Disposition:  To PACU stable   V. Annamarie Major, M.D., [facility] [place] Office: [phone] Pager:  [phone]

## 2019-05-07 NOTE — Discharge Instructions (Signed)
Incision Care, Adult °An incision is a cut that a doctor makes in your skin for surgery (for a procedure). Most times, these cuts are closed after surgery. Your cut from surgery may be closed with stitches (sutures), staples, skin glue, or skin tape (adhesive strips). You may need to return to your doctor to have stitches or staples taken out. This may happen many days or many weeks after your surgery. The cut needs to be well cared for so it does not get infected. °How to care for your cut °Cut care ° °· Follow instructions from your doctor about how to take care of your cut. Make sure you: °? Wash your hands with soap and water before you change your bandage (dressing). If you cannot use soap and water, use hand sanitizer. °? Change your bandage as told by your doctor. °? Leave stitches, skin glue, or skin tape in place. They may need to stay in place for 2 weeks or longer. If tape strips get loose and curl up, you may trim the loose edges. Do not remove tape strips completely unless your doctor says it is okay. °· Check your cut area every day for signs of infection. Check for: °? More redness, swelling, or pain. °? More fluid or blood. °? Warmth. °? Pus or a bad smell. °· Ask your doctor how to clean the cut. This may include: °? Using mild soap and water. °? Using a clean towel to pat the cut dry after you clean it. °? Putting a cream or ointment on the cut. Do this only as told by your doctor. °? Covering the cut with a clean bandage. °· Ask your doctor when you can leave the cut uncovered. °· Do not take baths, swim, or use a hot tub until your doctor says it is okay. Ask your doctor if you can take showers. You may only be allowed to take sponge baths for bathing. °Medicines °· If you were prescribed an antibiotic medicine, cream, or ointment, take the antibiotic or put it on the cut as told by your doctor. Do not stop taking or putting on the antibiotic even if your condition gets better. °· Take  over-the-counter and prescription medicines only as told by your doctor. °General instructions °· Limit movement around your cut. This helps healing. °? Avoid straining, lifting, or exercise for the first month, or for as long as told by your doctor. °? Follow instructions from your doctor about going back to your normal activities. °? Ask your doctor what activities are safe. °· Protect your cut from the sun when you are outside for the first 6 months, or for as long as told by your doctor. Put on sunscreen around the scar or cover up the scar. °· Keep all follow-up visits as told by your doctor. This is important. °Contact a doctor if: °· Your have more redness, swelling, or pain around the cut. °· You have more fluid or blood coming from the cut. °· Your cut feels warm to the touch. °· You have pus or a bad smell coming from the cut. °· You have a fever or shaking chills. °· You feel sick to your stomach (nauseous) or you throw up (vomit). °· You are dizzy. °· Your stitches or staples come undone. °Get help right away if: °· You have a red streak coming from your cut. °· Your cut bleeds through the bandage and the bleeding does not stop with gentle pressure. °· The edges of your cut   open up and separate. °· You have very bad (severe) pain. °· You have a rash. °· You are confused. °· You pass out (faint). °· You have trouble breathing and you have a fast heartbeat. °This information is not intended to replace advice given to you by your health care provider. Make sure you discuss any questions you have with your health care provider. °Document Released: 10/22/2011 Document Revised: 12/17/2016 Document Reviewed: 04/06/2016 °Elsevier Patient Education © 2020 [employer] ° °

## 2019-05-08 ENCOUNTER — Encounter (HOSPITAL_COMMUNITY): Payer: Self-pay | Admitting: Surgery

## 2019-05-08 DIAGNOSIS — D631 Anemia in chronic kidney disease: Secondary | ICD-10-CM | POA: Diagnosis not present

## 2019-05-08 DIAGNOSIS — Z992 Dependence on renal dialysis: Secondary | ICD-10-CM | POA: Diagnosis not present

## 2019-05-08 DIAGNOSIS — D509 Iron deficiency anemia, unspecified: Secondary | ICD-10-CM | POA: Diagnosis not present

## 2019-05-08 DIAGNOSIS — N2581 Secondary hyperparathyroidism of renal origin: Secondary | ICD-10-CM | POA: Diagnosis not present

## 2019-05-08 DIAGNOSIS — N186 End stage renal disease: Secondary | ICD-10-CM | POA: Diagnosis not present

## 2019-05-11 DIAGNOSIS — D631 Anemia in chronic kidney disease: Secondary | ICD-10-CM | POA: Diagnosis not present

## 2019-05-11 DIAGNOSIS — D509 Iron deficiency anemia, unspecified: Secondary | ICD-10-CM | POA: Diagnosis not present

## 2019-05-11 DIAGNOSIS — N2581 Secondary hyperparathyroidism of renal origin: Secondary | ICD-10-CM | POA: Diagnosis not present

## 2019-05-11 DIAGNOSIS — Z992 Dependence on renal dialysis: Secondary | ICD-10-CM | POA: Diagnosis not present

## 2019-05-11 DIAGNOSIS — N186 End stage renal disease: Secondary | ICD-10-CM | POA: Diagnosis not present

## 2019-05-13 DIAGNOSIS — N186 End stage renal disease: Secondary | ICD-10-CM | POA: Diagnosis not present

## 2019-05-13 DIAGNOSIS — N2581 Secondary hyperparathyroidism of renal origin: Secondary | ICD-10-CM | POA: Diagnosis not present

## 2019-05-13 DIAGNOSIS — Z992 Dependence on renal dialysis: Secondary | ICD-10-CM | POA: Diagnosis not present

## 2019-05-13 DIAGNOSIS — D631 Anemia in chronic kidney disease: Secondary | ICD-10-CM | POA: Diagnosis not present

## 2019-05-13 DIAGNOSIS — D509 Iron deficiency anemia, unspecified: Secondary | ICD-10-CM | POA: Diagnosis not present

## 2019-05-14 ENCOUNTER — Other Ambulatory Visit: Payer: Self-pay

## 2019-05-14 ENCOUNTER — Emergency Department (HOSPITAL_COMMUNITY): Payer: Medicare Other

## 2019-05-14 ENCOUNTER — Encounter (HOSPITAL_COMMUNITY): Payer: Self-pay

## 2019-05-14 ENCOUNTER — Emergency Department (HOSPITAL_COMMUNITY)
Admission: EM | Admit: 2019-05-14 | Discharge: 2019-05-14 | Disposition: A | Discharge status: Home / Self Care | Payer: Medicare Other | Attending: Emergency Medicine | Admitting: Emergency Medicine

## 2019-05-14 DIAGNOSIS — R079 Chest pain, unspecified: Secondary | ICD-10-CM | POA: Diagnosis not present

## 2019-05-14 DIAGNOSIS — I132 Hypertensive heart and chronic kidney disease with heart failure and with stage 5 chronic kidney disease, or end stage renal disease: Secondary | ICD-10-CM | POA: Insufficient documentation

## 2019-05-14 DIAGNOSIS — R0689 Other abnormalities of breathing: Secondary | ICD-10-CM | POA: Diagnosis not present

## 2019-05-14 DIAGNOSIS — Z79899 Other long term (current) drug therapy: Secondary | ICD-10-CM | POA: Insufficient documentation

## 2019-05-14 DIAGNOSIS — R0789 Other chest pain: Secondary | ICD-10-CM | POA: Insufficient documentation

## 2019-05-14 DIAGNOSIS — D509 Iron deficiency anemia, unspecified: Secondary | ICD-10-CM | POA: Diagnosis not present

## 2019-05-14 DIAGNOSIS — N186 End stage renal disease: Secondary | ICD-10-CM | POA: Insufficient documentation

## 2019-05-14 DIAGNOSIS — Z992 Dependence on renal dialysis: Secondary | ICD-10-CM | POA: Diagnosis not present

## 2019-05-14 DIAGNOSIS — I5032 Chronic diastolic (congestive) heart failure: Secondary | ICD-10-CM | POA: Insufficient documentation

## 2019-05-14 DIAGNOSIS — R9431 Abnormal electrocardiogram [ECG] [EKG]: Secondary | ICD-10-CM | POA: Diagnosis not present

## 2019-05-14 DIAGNOSIS — I959 Hypotension, unspecified: Secondary | ICD-10-CM | POA: Diagnosis not present

## 2019-05-14 DIAGNOSIS — Z87891 Personal history of nicotine dependence: Secondary | ICD-10-CM | POA: Diagnosis not present

## 2019-05-14 DIAGNOSIS — R0902 Hypoxemia: Secondary | ICD-10-CM | POA: Diagnosis not present

## 2019-05-14 DIAGNOSIS — N2581 Secondary hyperparathyroidism of renal origin: Secondary | ICD-10-CM | POA: Diagnosis not present

## 2019-05-14 DIAGNOSIS — Z23 Encounter for immunization: Secondary | ICD-10-CM | POA: Diagnosis not present

## 2019-05-14 DIAGNOSIS — R0602 Shortness of breath: Secondary | ICD-10-CM | POA: Diagnosis not present

## 2019-05-14 DIAGNOSIS — D631 Anemia in chronic kidney disease: Secondary | ICD-10-CM | POA: Diagnosis not present

## 2019-05-14 DIAGNOSIS — R072 Precordial pain: Secondary | ICD-10-CM | POA: Diagnosis not present

## 2019-05-14 LAB — BASIC METABOLIC PANEL
Anion gap: 17 — ABNORMAL HIGH (ref 5–15)
BUN: 46 mg/dL — ABNORMAL HIGH (ref 6–20)
CO2: 26 mmol/L (ref 22–32)
Calcium: 9.5 mg/dL (ref 8.9–10.3)
Chloride: 96 mmol/L — ABNORMAL LOW (ref 98–111)
Creatinine, Ser: 12.39 mg/dL — ABNORMAL HIGH (ref 0.61–1.24)
GFR calc Af Amer: 5 mL/min — ABNORMAL LOW (ref >60)
GFR calc non Af Amer: 4 mL/min — ABNORMAL LOW (ref >60)
Glucose, Bld: 111 mg/dL — ABNORMAL HIGH (ref 70–99)
Potassium: 4 mmol/L (ref 3.5–5.1)
Sodium: 139 mmol/L (ref 135–145)

## 2019-05-14 LAB — CBC
HCT: 34.5 % — ABNORMAL LOW (ref 39.0–52.0)
Hemoglobin: 10 g/dL — ABNORMAL LOW (ref 13.0–17.0)
MCH: 24.6 pg — ABNORMAL LOW (ref 26.0–34.0)
MCHC: 29 g/dL — ABNORMAL LOW (ref 30.0–36.0)
MCV: 85 fL (ref 80.0–100.0)
Platelets: 194 10*3/uL (ref 150–400)
RBC: 4.06 MIL/uL — ABNORMAL LOW (ref 4.22–5.81)
RDW: 16.1 % — ABNORMAL HIGH (ref 11.5–15.5)
WBC: 5.2 10*3/uL (ref 4.0–10.5)
nRBC: 0 % (ref 0.0–0.2)

## 2019-05-14 LAB — TROPONIN I (HIGH SENSITIVITY)
Troponin I (High Sensitivity): 19 ng/L — ABNORMAL HIGH (ref <18)
Troponin I (High Sensitivity): 17 ng/L (ref <18)

## 2019-05-14 MED ORDER — ASPIRIN 81 MG PO CHEW
324.0000 mg | CHEWABLE_TABLET | Freq: Once | ORAL | Status: AC
  Administered 2019-05-14: 12:00:00 324 mg via ORAL
  Filled 2019-05-14: qty 4

## 2019-05-14 MED ORDER — MORPHINE SULFATE (PF) 4 MG/ML IV SOLN
4.0000 mg | Freq: Once | INTRAVENOUS | Status: AC
  Administered 2019-05-14: 12:00:00 4 mg via INTRAVENOUS
  Filled 2019-05-14: qty 1

## 2019-05-14 MED ORDER — SODIUM CHLORIDE 0.9% FLUSH
3.0000 mL | Freq: Once | INTRAVENOUS | Status: AC
  Administered 2019-05-14: 3 mL via INTRAVENOUS

## 2019-05-14 MED ORDER — NITROGLYCERIN 0.4 MG SL SUBL
0.4000 mg | SUBLINGUAL_TABLET | SUBLINGUAL | Status: DC | PRN
  Filled 2019-05-14: qty 1

## 2019-05-14 NOTE — Discharge Instructions (Addendum)
You were seen in the emergency department for chest pain while undergoing dialysis.  You had blood work EKG and a chest x-ray here that did not show any obvious signs of heart damage.  It would be important for you to continue your regular medications and follow-up with your doctor regarding your chest pain.  You may require further work-up.  We recommend that you call your primary care doctor to discuss your visit to the emergency department and your symptoms.  You may need outpatient stress testing.  Please return to the emergency department if any worsening symptoms.

## 2019-05-14 NOTE — ED Provider Notes (Signed)
[facility] EMERGENCY DEPARTMENT Provider Note   CSN: [id number] Arrival date & time: 05/14/19  1112     History   Chief Complaint Chief Complaint  Patient presents with  . Chest Pain    HPI Alejandro Lee is a 58 y.o. male.  He has a history of diabetes and end-stage renal disease Monday Wednesday Friday.  He said he is had a heart attack in the past.  He is complaining of 1 hour of substernal chest pain that occurred at rest while he was undergoing dialysis.  He rates the pain is 10 out of 10.  It does not radiate anywhere.  It is associated with shortness of breath.  He said they stopped dialysis about 15 minutes early.  He received some nitroglycerin there that he said helped a little bit.     The history is provided by the patient.  Chest Pain Pain location:  Substernal area Pain quality: pressure   Pain radiates to:  Does not radiate Pain severity:  Severe Onset quality:  Sudden Duration:  1 hour Timing:  Constant Progression:  Unchanged Chronicity:  New Context: at rest   Relieved by:  Nothing Worsened by:  Nothing Ineffective treatments:  Nitroglycerin Associated symptoms: shortness of breath   Associated symptoms: no abdominal pain, no back pain, no cough, no diaphoresis, no fever, no headache, no nausea and no vomiting   Risk factors: diabetes mellitus, high cholesterol, hypertension and male sex     Past Medical History:  Diagnosis Date  . Anemia    1 blood transfusion  . Arthritis   . Back pain   . CHF (congestive heart failure) ([place])   . Chronic kidney disease    dialysis M/W/F  . Diabetes mellitus without complication ([place])   . DVT (deep venous thrombosis) ([place])   . GERD (gastroesophageal reflux disease)   . Headache    migraines  . History of cardiovascular stress test 11/2017   [facility]) negative dobutamine stress test  . Hyperlipidemia   . Hypertension   . Legally blind    right  . Myocardial infarction [facility])    patient states it was seen on  EKG, denies a cardiac cath    Patient Active Problem List   Diagnosis Date Noted  . ESRD on hemodialysis ([place])   . Hyperkalemia 06/25/2018  . Non-compliance with renal dialysis ([place]) 06/25/2018  . Generalized abdominal pain 06/25/2018  . Chronic back pain 06/25/2018  . GERD (gastroesophageal reflux disease) 06/25/2018  . Chronic diastolic HF (heart failure) ([place]) 06/25/2018  . Class 1 obesity due to excess calories in adult 06/25/2018  . Benign essential HTN 06/25/2018  . 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 Type 2 diabetes mellitus with other specified complication ([place]) [id number]  . ESRD (end stage renal disease) on dialysis ([place]) 12/19/2017  . Chronic right-sided low back pain without sciatica 12/19/2017  . Pseudophakia of left eye 09/03/2017  . PDR (proliferative diabetic retinopathy) ([place]) 09/03/2017  . Cataract in degenerative disorder 09/03/2017  . Unspecified complication of internal prosthetic device, implant and graft, initial encounter 05/17/2017  . Rectal abscess 05/17/2017  . 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 Metabolic disorder [id number]  . Hypertension 05/16/2017  . Dependence on renal dialysis ([place]) 05/16/2017  . Anemia of chronic renal failure, stage 5 (HCC) 05/16/2017  . Retinal detachment, tractional, both eyes 06/19/2012    Past Surgical History:  Procedure Laterality Date  . A/V FISTULAGRAM Right 07/22/2018  Procedure: A/V FISTULAGRAM;  Surgeon: Serafina Mitchell, MD;  Location: [place] [facility];  Service: Cardiovascular;  Laterality: Right;  . AV FISTULA PLACEMENT Right   . AV FISTULA PLACEMENT Right [id number]   Procedure: PLICATION OF RIGHT arm FISTULA;  Surgeon: Waynetta Sandy, MD;  Location: [place];  Service: Vascular;  Laterality: Right;  . BASCILIC VEIN TRANSPOSITION Left 02/05/2019   Procedure: BRACHIOCEPHALIC FISTULA  LEFT ARM;  Surgeon: Serafina Mitchell, MD;  Location:  [facility] OR;  Service: Vascular;  Laterality: Left;  . COLONOSCOPY    . diabetic cyst removal Bilateral    on buttocks x8  . EYE SURGERY    . FISTULA SUPERFICIALIZATION Right [id number]   Procedure: PLICATION OF ARTERIOVENOUS FISTULA RIGHT ARM;  Surgeon: Conrad Revere, MD;  Location: [place];  Service: Vascular;  Laterality: Right;  . lens replacement Left   . LIGATION OF ARTERIOVENOUS  FISTULA  03/19/2019   Procedure: LIGATION OF ARTERIOVENOUS  OF COMPETING BRANCHES OF FISTULA LEFT UPPER ARM;  Surgeon: Serafina Mitchell, MD;  Location: [facility] OR;  Service: Vascular;;  . LIGATION OF ARTERIOVENOUS  FISTULA Right 05/07/2019   Procedure: LIGATION OF ARTERIOVENOUS  FISTULA RIGHT ARM;  Surgeon: Serafina Mitchell, MD;  Location: [place];  Service: Vascular;  Laterality: Right;  . PERIPHERAL VASCULAR BALLOON ANGIOPLASTY  07/22/2018   Procedure: PERIPHERAL VASCULAR BALLOON ANGIOPLASTY;  Surgeon: Serafina Mitchell, MD;  Location: [place] [facility];  Service: Cardiovascular;;  Right [place] fistula   . PERIPHERAL VASCULAR BALLOON ANGIOPLASTY Right 02/03/2019   Procedure: PERIPHERAL VASCULAR BALLOON ANGIOPLASTY;  Surgeon: Serafina Mitchell, MD;  Location: [place] [facility];  Service: Cardiovascular;  Laterality: Right;  RUE AVF  . REFRACTIVE SURGERY Bilateral         Home Medications    Prior to Admission medications   Medication Sig Start Date End Date Taking? Authorizing Provider  acetaminophen (TYLENOL) 500 MG tablet Take 1,000 mg by mouth every 6 (six) hours as needed for moderate pain.    [provider]  albuterol (VENTOLIN HFA) 108 (90 Base) MCG/ACT inhaler Inhale 2 puffs into the lungs every 6 (six) hours as needed for wheezing or shortness of breath.    [provider]  amLODipine (NORVASC) 5 MG tablet Take 1 tablet (5 mg total) by mouth daily. 06/26/18   Barton Dubois, MD  Aspirin-Caffeine (BAYER BACK & BODY PAIN EX ST PO) Take 1 tablet by mouth daily as needed (pain).    [provider]  calcium acetate (PHOSLO) 667 MG capsule Take 2,001 mg by mouth 3 (three) times daily with meals.     [provider]  Cholecalciferol (VITAMIN D3) 10 MCG (400 UNIT) tablet Take 400 Units by mouth daily.    [provider]  HYDROcodone-acetaminophen (NORCO/VICODIN) 5-325 MG tablet Take 1 tablet by mouth every 6 (six) hours as needed. 05/07/19   Dagoberto Ligas, PA-C  lidocaine-prilocaine (EMLA) cream Apply 1 application topically as needed (port acces).    [provider]  loperamide (IMODIUM A-D) 2 MG tablet Take 2 mg by mouth 4 (four) times daily as needed for diarrhea or loose stools.    [provider]  oxyCODONE-acetaminophen (PERCOCET/ROXICET) 5-325 MG tablet Take 1 tablet by mouth every 4 (four) hours as needed for severe pain. 04/13/19   Serafina Mitchell, MD  sitaGLIPtin (JANUVIA) 25 MG tablet Take 25 mg by mouth daily.    [provider]  sorbitol 70 % solution Take  15 mLs by mouth daily as needed (constipation).     [provider]    Family History Family History  Problem Relation Age of Onset  . Diabetes Mother   . Hypertension Mother   . Diabetes Father   . Heart disease Father   . Cancer Father   . Hypertension Father   . Diabetes Sister   . Hypertension Sister   . Diabetes Brother   . Hypertension Brother   . Cancer Brother     Social History Social History   Tobacco Use  . Smoking status: Former Smoker    Types: Cigarettes  . Smokeless tobacco: Never Used  Substance Use Topics  . Alcohol use: Not Currently  . Drug use: Never     Allergies   Patient has no known allergies.   Review of Systems Review of Systems  Constitutional: Negative for diaphoresis and fever.  HENT: Negative for sore throat.   Eyes: Negative for pain.  Respiratory: Positive for shortness of breath. Negative for cough.   Cardiovascular: Positive for chest pain.  Gastrointestinal: Negative for abdominal pain, nausea  and vomiting.  Genitourinary: Negative for dysuria.  Musculoskeletal: Negative for back pain.  Skin: Negative for rash.  Neurological: Negative for headaches.     Physical Exam Updated Vital Signs BP (!) 131/53 (BP Location: Left Arm)   Pulse 81   Temp 97.7 F (36.5 C) (Oral)   Resp 18   Ht 5\' 7"  (1.702 m)   Wt 98.4 kg   SpO2 100%   BMI 33.98 kg/m   Physical Exam Vitals signs and nursing note reviewed.  Constitutional:      Appearance: He is well-developed.  HENT:     Head: Normocephalic and atraumatic.  Eyes:     Conjunctiva/sclera: Conjunctivae normal.  Neck:     Musculoskeletal: Neck supple.  Cardiovascular:     Rate and Rhythm: Normal rate and regular rhythm.     Heart sounds: Normal heart sounds. No murmur.  Pulmonary:     Effort: Pulmonary effort is normal. No respiratory distress.     Breath sounds: Normal breath sounds.  Abdominal:     Palpations: Abdomen is soft.     Tenderness: There is no abdominal tenderness.  Musculoskeletal: Normal range of motion.     Right lower leg: He exhibits no tenderness.     Left lower leg: He exhibits no tenderness.     Comments: Fistula left upper arm with positive thrill.  Skin:    General: Skin is warm and dry.     Capillary Refill: Capillary refill takes less than 2 seconds.  Neurological:     General: No focal deficit present.     Mental Status: He is alert.      ED Treatments / Results  Labs (all labs ordered are listed, but only abnormal results are displayed) Labs Reviewed  BASIC METABOLIC PANEL - Abnormal; Notable for the following components:      Result Value   Chloride 96 (*)    Glucose, Bld 111 (*)    BUN 46 (*)    Creatinine, Ser 12.39 (*)    GFR calc non Af Amer 4 (*)    GFR calc Af Amer 5 (*)    Anion gap 17 (*)    All other components within normal limits  CBC - Abnormal; Notable for the following components:   RBC 4.06 (*)    Hemoglobin 10.0 (*)    HCT 34.5 (*)    [facility]  24.6 (*)    MCHC  29.0 (*)    RDW 16.1 (*)    All other components within normal limits  TROPONIN I (HIGH SENSITIVITY) - Abnormal; Notable for the following components:   Troponin I (High Sensitivity) 19 (*)    All other components within normal limits  TROPONIN I (HIGH SENSITIVITY)    EKG EKG Interpretation  Date/Time:  Thursday May 14 2019 11:46:51 EDT Ventricular Rate:  81 PR Interval:    QRS Duration: 112 QT Interval:  459 QTC Calculation: 533 R Axis:   -2 Text Interpretation:  Sinus rhythm Incomplete left bundle branch block Low voltage, extremity leads Prolonged QT interval similar to prior8/20 Confirmed by Aletta Edouard [phone]) on 05/14/2019 11:52:17 AM   Radiology Dg Chest Port 1 View  Result Date: 05/14/2019 CLINICAL DATA:  Chest pain EXAM: PORTABLE CHEST 1 VIEW COMPARISON:  03/18/2019 FINDINGS: Heart and mediastinal contours are within normal limits. No focal opacities or effusions. No acute bony abnormality. IMPRESSION: No active disease. Electronically Signed   By: Rolm Baptise M.D.   On: 05/14/2019 12:02    Procedures Procedures (including critical care time)  Medications Ordered in ED Medications  sodium chloride flush (NS) 0.9 % injection 3 mL (has no administration in time range)  aspirin chewable tablet 324 mg (has no administration in time range)  nitroGLYCERIN (NITROSTAT) SL tablet 0.4 mg (has no administration in time range)  morphine 4 MG/ML injection 4 mg (has no administration in time range)     Initial Impression / Assessment and Plan / ED Course  I have reviewed the triage vital signs and the nursing notes.  Pertinent labs & imaging results that were available during my care of the patient were reviewed by me and considered in my medical decision making (see chart for details).  Clinical Course as of May 14 1639  Thu Oct 01, 227  3952 58 year old male here with 1 hour of substernal chest pain occurring while on dialysis.  Complaining of 10 out of 10 pain.   Differential includes ACS, pneumonia, pneumothorax,, vascular complication   [MB]  [id number] EKG not importing and from use.  Shows sinus rhythm rate of 82 nonspecific ST-T changes similar to prior EKG 8/20   [MB]  1205 Patient chest x-ray reviewed by me no acute disease.   [MB]  [id number] Went back to check on the patient and he said his pain is improved.  Initial troponin 19.    [MB]    Clinical Course User Index [MB] Hayden Rasmussen, MD        Final Clinical Impressions(s) / ED Diagnoses   Final diagnoses:  Nonspecific chest pain  SOB (shortness of breath)    ED Discharge Orders    None       Hayden Rasmussen, MD 05/14/19 1640

## 2019-05-14 NOTE — ED Triage Notes (Signed)
EMS reports pt was at dialysis this morning and started having sharp chest pain.  Reports pain worse with movement or coughing.  Denies recent cough or illness.  Pt was supposed to have dialysis yesterday but had trouble sticking him so they did it today and he had extra fluid.

## 2019-05-14 NOTE — ED Provider Notes (Signed)
  Provider Note MRN:  [id number]  Arrival date & time: 05/14/19    ED Course and Medical Decision Making  Assumed care from Dr. Melina Copa at shift change.  Chest pain and shortness of breath during dialysis today.  First troponin is 19.  Patient is now asymptomatic.  Second troponin has returned at 17.  On my assessment patient is in no acute distress, no evidence of DVT, no tachycardia, no increased work of breathing, nothing to suggest pulmonary embolism.  Equal radial pulses bilaterally, normal mediastinum on chest x-ray, nothing to suggest dissection.  Favoring disequilibrium related to the dialysis.  Recommend primary care follow-up to discuss need for outpatient stress testing.  Strict return precautions.  Final Clinical Impressions(s) / ED Diagnoses     ICD-10-CM   1. Nonspecific chest pain  R07.9   2. SOB (shortness of breath)  R06.02     ED Discharge Orders    None        Discharge Instructions     You were seen in the emergency department for chest pain while undergoing dialysis.  You had blood work EKG and a chest x-ray here that did not show any obvious signs of heart damage.  It would be important for you to continue your regular medications and follow-up with your doctor regarding your chest pain.  You may require further work-up.  We recommend that you call your primary care doctor to discuss your visit to the emergency department and your symptoms.  You may need outpatient stress testing.  Please return to the emergency department if any worsening symptoms.    Barth Kirks. [place] Small, [place] [email or web address]    Maudie Flakes, MD 05/14/19 1520

## 2019-05-18 DIAGNOSIS — D631 Anemia in chronic kidney disease: Secondary | ICD-10-CM | POA: Diagnosis not present

## 2019-05-18 DIAGNOSIS — N2581 Secondary hyperparathyroidism of renal origin: Secondary | ICD-10-CM | POA: Diagnosis not present

## 2019-05-18 DIAGNOSIS — D509 Iron deficiency anemia, unspecified: Secondary | ICD-10-CM | POA: Diagnosis not present

## 2019-05-18 DIAGNOSIS — Z23 Encounter for immunization: Secondary | ICD-10-CM | POA: Diagnosis not present

## 2019-05-18 DIAGNOSIS — N186 End stage renal disease: Secondary | ICD-10-CM | POA: Diagnosis not present

## 2019-05-18 DIAGNOSIS — Z992 Dependence on renal dialysis: Secondary | ICD-10-CM | POA: Diagnosis not present

## 2019-05-20 DIAGNOSIS — D631 Anemia in chronic kidney disease: Secondary | ICD-10-CM | POA: Diagnosis not present

## 2019-05-20 DIAGNOSIS — Z23 Encounter for immunization: Secondary | ICD-10-CM | POA: Diagnosis not present

## 2019-05-20 DIAGNOSIS — Z992 Dependence on renal dialysis: Secondary | ICD-10-CM | POA: Diagnosis not present

## 2019-05-20 DIAGNOSIS — N2581 Secondary hyperparathyroidism of renal origin: Secondary | ICD-10-CM | POA: Diagnosis not present

## 2019-05-20 DIAGNOSIS — N186 End stage renal disease: Secondary | ICD-10-CM | POA: Diagnosis not present

## 2019-05-20 DIAGNOSIS — D509 Iron deficiency anemia, unspecified: Secondary | ICD-10-CM | POA: Diagnosis not present

## 2019-05-22 DIAGNOSIS — D509 Iron deficiency anemia, unspecified: Secondary | ICD-10-CM | POA: Diagnosis not present

## 2019-05-22 DIAGNOSIS — Z992 Dependence on renal dialysis: Secondary | ICD-10-CM | POA: Diagnosis not present

## 2019-05-22 DIAGNOSIS — Z23 Encounter for immunization: Secondary | ICD-10-CM | POA: Diagnosis not present

## 2019-05-22 DIAGNOSIS — N2581 Secondary hyperparathyroidism of renal origin: Secondary | ICD-10-CM | POA: Diagnosis not present

## 2019-05-22 DIAGNOSIS — D631 Anemia in chronic kidney disease: Secondary | ICD-10-CM | POA: Diagnosis not present

## 2019-05-22 DIAGNOSIS — N186 End stage renal disease: Secondary | ICD-10-CM | POA: Diagnosis not present

## 2019-05-25 DIAGNOSIS — D631 Anemia in chronic kidney disease: Secondary | ICD-10-CM | POA: Diagnosis not present

## 2019-05-25 DIAGNOSIS — Z23 Encounter for immunization: Secondary | ICD-10-CM | POA: Diagnosis not present

## 2019-05-25 DIAGNOSIS — N2581 Secondary hyperparathyroidism of renal origin: Secondary | ICD-10-CM | POA: Diagnosis not present

## 2019-05-25 DIAGNOSIS — Z992 Dependence on renal dialysis: Secondary | ICD-10-CM | POA: Diagnosis not present

## 2019-05-25 DIAGNOSIS — N186 End stage renal disease: Secondary | ICD-10-CM | POA: Diagnosis not present

## 2019-05-25 DIAGNOSIS — D509 Iron deficiency anemia, unspecified: Secondary | ICD-10-CM | POA: Diagnosis not present

## 2019-05-27 DIAGNOSIS — N2581 Secondary hyperparathyroidism of renal origin: Secondary | ICD-10-CM | POA: Diagnosis not present

## 2019-05-27 DIAGNOSIS — D631 Anemia in chronic kidney disease: Secondary | ICD-10-CM | POA: Diagnosis not present

## 2019-05-27 DIAGNOSIS — Z992 Dependence on renal dialysis: Secondary | ICD-10-CM | POA: Diagnosis not present

## 2019-05-27 DIAGNOSIS — Z23 Encounter for immunization: Secondary | ICD-10-CM | POA: Diagnosis not present

## 2019-05-27 DIAGNOSIS — N186 End stage renal disease: Secondary | ICD-10-CM | POA: Diagnosis not present

## 2019-05-27 DIAGNOSIS — D509 Iron deficiency anemia, unspecified: Secondary | ICD-10-CM | POA: Diagnosis not present

## 2019-05-29 DIAGNOSIS — D631 Anemia in chronic kidney disease: Secondary | ICD-10-CM | POA: Diagnosis not present

## 2019-05-29 DIAGNOSIS — Z992 Dependence on renal dialysis: Secondary | ICD-10-CM | POA: Diagnosis not present

## 2019-05-29 DIAGNOSIS — N2581 Secondary hyperparathyroidism of renal origin: Secondary | ICD-10-CM | POA: Diagnosis not present

## 2019-05-29 DIAGNOSIS — D509 Iron deficiency anemia, unspecified: Secondary | ICD-10-CM | POA: Diagnosis not present

## 2019-05-29 DIAGNOSIS — N186 End stage renal disease: Secondary | ICD-10-CM | POA: Diagnosis not present

## 2019-05-29 DIAGNOSIS — Z23 Encounter for immunization: Secondary | ICD-10-CM | POA: Diagnosis not present

## 2019-06-01 DIAGNOSIS — Z23 Encounter for immunization: Secondary | ICD-10-CM | POA: Diagnosis not present

## 2019-06-01 DIAGNOSIS — D509 Iron deficiency anemia, unspecified: Secondary | ICD-10-CM | POA: Diagnosis not present

## 2019-06-01 DIAGNOSIS — Z992 Dependence on renal dialysis: Secondary | ICD-10-CM | POA: Diagnosis not present

## 2019-06-01 DIAGNOSIS — D631 Anemia in chronic kidney disease: Secondary | ICD-10-CM | POA: Diagnosis not present

## 2019-06-01 DIAGNOSIS — N186 End stage renal disease: Secondary | ICD-10-CM | POA: Diagnosis not present

## 2019-06-01 DIAGNOSIS — N2581 Secondary hyperparathyroidism of renal origin: Secondary | ICD-10-CM | POA: Diagnosis not present

## 2019-06-03 DIAGNOSIS — Z23 Encounter for immunization: Secondary | ICD-10-CM | POA: Diagnosis not present

## 2019-06-03 DIAGNOSIS — D509 Iron deficiency anemia, unspecified: Secondary | ICD-10-CM | POA: Diagnosis not present

## 2019-06-03 DIAGNOSIS — N2581 Secondary hyperparathyroidism of renal origin: Secondary | ICD-10-CM | POA: Diagnosis not present

## 2019-06-03 DIAGNOSIS — N186 End stage renal disease: Secondary | ICD-10-CM | POA: Diagnosis not present

## 2019-06-03 DIAGNOSIS — Z992 Dependence on renal dialysis: Secondary | ICD-10-CM | POA: Diagnosis not present

## 2019-06-03 DIAGNOSIS — D631 Anemia in chronic kidney disease: Secondary | ICD-10-CM | POA: Diagnosis not present

## 2019-06-05 DIAGNOSIS — Z992 Dependence on renal dialysis: Secondary | ICD-10-CM | POA: Diagnosis not present

## 2019-06-05 DIAGNOSIS — D509 Iron deficiency anemia, unspecified: Secondary | ICD-10-CM | POA: Diagnosis not present

## 2019-06-05 DIAGNOSIS — D631 Anemia in chronic kidney disease: Secondary | ICD-10-CM | POA: Diagnosis not present

## 2019-06-05 DIAGNOSIS — N2581 Secondary hyperparathyroidism of renal origin: Secondary | ICD-10-CM | POA: Diagnosis not present

## 2019-06-05 DIAGNOSIS — N186 End stage renal disease: Secondary | ICD-10-CM | POA: Diagnosis not present

## 2019-06-05 DIAGNOSIS — Z23 Encounter for immunization: Secondary | ICD-10-CM | POA: Diagnosis not present

## 2019-06-08 DIAGNOSIS — Z992 Dependence on renal dialysis: Secondary | ICD-10-CM | POA: Diagnosis not present

## 2019-06-08 DIAGNOSIS — N186 End stage renal disease: Secondary | ICD-10-CM | POA: Diagnosis not present

## 2019-06-08 DIAGNOSIS — Z23 Encounter for immunization: Secondary | ICD-10-CM | POA: Diagnosis not present

## 2019-06-08 DIAGNOSIS — D631 Anemia in chronic kidney disease: Secondary | ICD-10-CM | POA: Diagnosis not present

## 2019-06-08 DIAGNOSIS — D509 Iron deficiency anemia, unspecified: Secondary | ICD-10-CM | POA: Diagnosis not present

## 2019-06-08 DIAGNOSIS — N2581 Secondary hyperparathyroidism of renal origin: Secondary | ICD-10-CM | POA: Diagnosis not present

## 2019-06-10 DIAGNOSIS — N186 End stage renal disease: Secondary | ICD-10-CM | POA: Diagnosis not present

## 2019-06-10 DIAGNOSIS — D509 Iron deficiency anemia, unspecified: Secondary | ICD-10-CM | POA: Diagnosis not present

## 2019-06-10 DIAGNOSIS — D631 Anemia in chronic kidney disease: Secondary | ICD-10-CM | POA: Diagnosis not present

## 2019-06-10 DIAGNOSIS — Z992 Dependence on renal dialysis: Secondary | ICD-10-CM | POA: Diagnosis not present

## 2019-06-10 DIAGNOSIS — N2581 Secondary hyperparathyroidism of renal origin: Secondary | ICD-10-CM | POA: Diagnosis not present

## 2019-06-10 DIAGNOSIS — Z23 Encounter for immunization: Secondary | ICD-10-CM | POA: Diagnosis not present

## 2019-06-13 DIAGNOSIS — Z992 Dependence on renal dialysis: Secondary | ICD-10-CM | POA: Diagnosis not present

## 2019-06-13 DIAGNOSIS — N186 End stage renal disease: Secondary | ICD-10-CM | POA: Diagnosis not present

## 2019-06-15 DIAGNOSIS — N186 End stage renal disease: Secondary | ICD-10-CM | POA: Diagnosis not present

## 2019-06-15 DIAGNOSIS — N2581 Secondary hyperparathyroidism of renal origin: Secondary | ICD-10-CM | POA: Diagnosis not present

## 2019-06-15 DIAGNOSIS — D509 Iron deficiency anemia, unspecified: Secondary | ICD-10-CM | POA: Diagnosis not present

## 2019-06-15 DIAGNOSIS — Z992 Dependence on renal dialysis: Secondary | ICD-10-CM | POA: Diagnosis not present

## 2019-06-15 DIAGNOSIS — D631 Anemia in chronic kidney disease: Secondary | ICD-10-CM | POA: Diagnosis not present

## 2019-06-17 ENCOUNTER — Other Ambulatory Visit: Payer: Self-pay

## 2019-06-17 ENCOUNTER — Ambulatory Visit (INDEPENDENT_AMBULATORY_CARE_PROVIDER_SITE_OTHER): Payer: Self-pay | Admitting: Physician Assistant

## 2019-06-17 VITALS — BP 132/74 | HR 74 | Temp 98.2°F | Resp 14 | Ht 67.0 in | Wt 205.9 lb

## 2019-06-17 DIAGNOSIS — D631 Anemia in chronic kidney disease: Secondary | ICD-10-CM | POA: Diagnosis not present

## 2019-06-17 DIAGNOSIS — Z992 Dependence on renal dialysis: Secondary | ICD-10-CM | POA: Diagnosis not present

## 2019-06-17 DIAGNOSIS — N186 End stage renal disease: Secondary | ICD-10-CM

## 2019-06-17 DIAGNOSIS — N2581 Secondary hyperparathyroidism of renal origin: Secondary | ICD-10-CM | POA: Diagnosis not present

## 2019-06-17 DIAGNOSIS — D509 Iron deficiency anemia, unspecified: Secondary | ICD-10-CM | POA: Diagnosis not present

## 2019-06-17 NOTE — Progress Notes (Signed)
    Postoperative Access Visit   History of Present Illness   Alejandro Lee is a 58 y.o. year old male who presents for postoperative follow-up for incision check.  Patient had a working right arm fistula however developed a central vein occlusion and thus developed a painful and swollen right arm.  Fortunately symptoms were tolerable until left arm AV fistula matured enough to be able to be used for hemodialysis.  On 05/07/2019 right arm AV fistula was ligated by Dr. Trula Slade.  He states his incision is healed however notes that his right arm thrombosed fistula is still as large as it was when it was functioning.  It is not painful or bothersome however patient thought aneurysmal areas would have flattened after surgery.  He reports no problems with dialyzing via left arm AV fistula and is on a Monday Wednesday Friday schedule.   Physical Examination   Vitals:   06/17/19 1337  BP: 132/74  Pulse: 74  Resp: 14  Temp: 98.2 F (36.8 C)  TempSrc: Temporal  SpO2: 99%  Weight: 205 lb 14.4 oz (93.4 kg)  Height: 5\' 7"  (1.702 m)   Body mass index is 32.25 kg/m.  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 Scot Dock

## 2019-06-22 DIAGNOSIS — N2581 Secondary hyperparathyroidism of renal origin: Secondary | ICD-10-CM | POA: Diagnosis not present

## 2019-06-22 DIAGNOSIS — N186 End stage renal disease: Secondary | ICD-10-CM | POA: Diagnosis not present

## 2019-06-22 DIAGNOSIS — D631 Anemia in chronic kidney disease: Secondary | ICD-10-CM | POA: Diagnosis not present

## 2019-06-22 DIAGNOSIS — Z992 Dependence on renal dialysis: Secondary | ICD-10-CM | POA: Diagnosis not present

## 2019-06-22 DIAGNOSIS — D509 Iron deficiency anemia, unspecified: Secondary | ICD-10-CM | POA: Diagnosis not present

## 2019-06-24 DIAGNOSIS — Z992 Dependence on renal dialysis: Secondary | ICD-10-CM | POA: Diagnosis not present

## 2019-06-24 DIAGNOSIS — N2581 Secondary hyperparathyroidism of renal origin: Secondary | ICD-10-CM | POA: Diagnosis not present

## 2019-06-24 DIAGNOSIS — N186 End stage renal disease: Secondary | ICD-10-CM | POA: Diagnosis not present

## 2019-06-24 DIAGNOSIS — D509 Iron deficiency anemia, unspecified: Secondary | ICD-10-CM | POA: Diagnosis not present

## 2019-06-24 DIAGNOSIS — D631 Anemia in chronic kidney disease: Secondary | ICD-10-CM | POA: Diagnosis not present

## 2019-06-26 DIAGNOSIS — Z992 Dependence on renal dialysis: Secondary | ICD-10-CM | POA: Diagnosis not present

## 2019-06-26 DIAGNOSIS — D631 Anemia in chronic kidney disease: Secondary | ICD-10-CM | POA: Diagnosis not present

## 2019-06-26 DIAGNOSIS — D509 Iron deficiency anemia, unspecified: Secondary | ICD-10-CM | POA: Diagnosis not present

## 2019-06-26 DIAGNOSIS — N2581 Secondary hyperparathyroidism of renal origin: Secondary | ICD-10-CM | POA: Diagnosis not present

## 2019-06-26 DIAGNOSIS — N186 End stage renal disease: Secondary | ICD-10-CM | POA: Diagnosis not present

## 2019-06-27 DIAGNOSIS — Z9842 Cataract extraction status, left eye: Secondary | ICD-10-CM | POA: Diagnosis not present

## 2019-06-27 DIAGNOSIS — E103599 Type 1 diabetes mellitus with proliferative diabetic retinopathy without macular edema, unspecified eye: Secondary | ICD-10-CM | POA: Diagnosis not present

## 2019-06-27 DIAGNOSIS — Z940 Kidney transplant status: Secondary | ICD-10-CM | POA: Diagnosis not present

## 2019-06-27 DIAGNOSIS — N261 Atrophy of kidney (terminal): Secondary | ICD-10-CM | POA: Diagnosis not present

## 2019-06-27 DIAGNOSIS — H3340 Traction detachment of retina, unspecified eye: Secondary | ICD-10-CM | POA: Diagnosis not present

## 2019-06-27 DIAGNOSIS — Z5181 Encounter for therapeutic drug level monitoring: Secondary | ICD-10-CM | POA: Diagnosis not present

## 2019-06-27 DIAGNOSIS — Z7952 Long term (current) use of systemic steroids: Secondary | ICD-10-CM | POA: Diagnosis not present

## 2019-06-27 DIAGNOSIS — E1039 Type 1 diabetes mellitus with other diabetic ophthalmic complication: Secondary | ICD-10-CM | POA: Diagnosis not present

## 2019-06-27 DIAGNOSIS — E1122 Type 2 diabetes mellitus with diabetic chronic kidney disease: Secondary | ICD-10-CM | POA: Diagnosis not present

## 2019-06-27 DIAGNOSIS — E871 Hypo-osmolality and hyponatremia: Secondary | ICD-10-CM | POA: Diagnosis not present

## 2019-06-27 DIAGNOSIS — N186 End stage renal disease: Secondary | ICD-10-CM | POA: Diagnosis not present

## 2019-06-27 DIAGNOSIS — Z01818 Encounter for other preprocedural examination: Secondary | ICD-10-CM | POA: Diagnosis not present

## 2019-06-27 DIAGNOSIS — Z792 Long term (current) use of antibiotics: Secondary | ICD-10-CM | POA: Diagnosis not present

## 2019-06-27 DIAGNOSIS — Z79899 Other long term (current) drug therapy: Secondary | ICD-10-CM | POA: Diagnosis not present

## 2019-06-27 DIAGNOSIS — E109 Type 1 diabetes mellitus without complications: Secondary | ICD-10-CM | POA: Diagnosis not present

## 2019-06-27 DIAGNOSIS — R918 Other nonspecific abnormal finding of lung field: Secondary | ICD-10-CM | POA: Diagnosis not present

## 2019-06-27 DIAGNOSIS — I120 Hypertensive chronic kidney disease with stage 5 chronic kidney disease or end stage renal disease: Secondary | ICD-10-CM | POA: Diagnosis not present

## 2019-06-27 DIAGNOSIS — T82898A Other specified complication of vascular prosthetic devices, implants and grafts, initial encounter: Secondary | ICD-10-CM | POA: Diagnosis not present

## 2019-06-27 DIAGNOSIS — Z01810 Encounter for preprocedural cardiovascular examination: Secondary | ICD-10-CM | POA: Diagnosis not present

## 2019-06-27 DIAGNOSIS — N269 Renal sclerosis, unspecified: Secondary | ICD-10-CM | POA: Diagnosis not present

## 2019-06-27 DIAGNOSIS — E875 Hyperkalemia: Secondary | ICD-10-CM | POA: Diagnosis not present

## 2019-06-27 DIAGNOSIS — Z20828 Contact with and (suspected) exposure to other viral communicable diseases: Secondary | ICD-10-CM | POA: Diagnosis not present

## 2019-06-27 DIAGNOSIS — E1121 Type 2 diabetes mellitus with diabetic nephropathy: Secondary | ICD-10-CM | POA: Diagnosis not present

## 2019-06-27 DIAGNOSIS — T8619 Other complication of kidney transplant: Secondary | ICD-10-CM | POA: Diagnosis not present

## 2019-06-27 DIAGNOSIS — Z794 Long term (current) use of insulin: Secondary | ICD-10-CM | POA: Diagnosis not present

## 2019-06-27 DIAGNOSIS — D631 Anemia in chronic kidney disease: Secondary | ICD-10-CM | POA: Diagnosis not present

## 2019-06-27 DIAGNOSIS — I10 Essential (primary) hypertension: Secondary | ICD-10-CM | POA: Diagnosis not present

## 2019-06-27 DIAGNOSIS — N2581 Secondary hyperparathyroidism of renal origin: Secondary | ICD-10-CM | POA: Diagnosis not present

## 2019-06-27 DIAGNOSIS — Z961 Presence of intraocular lens: Secondary | ICD-10-CM | POA: Diagnosis not present

## 2019-06-27 DIAGNOSIS — N3289 Other specified disorders of bladder: Secondary | ICD-10-CM | POA: Diagnosis not present

## 2019-06-27 DIAGNOSIS — E8770 Fluid overload, unspecified: Secondary | ICD-10-CM | POA: Diagnosis not present

## 2019-06-27 DIAGNOSIS — Z992 Dependence on renal dialysis: Secondary | ICD-10-CM | POA: Diagnosis not present

## 2019-06-27 DIAGNOSIS — R944 Abnormal results of kidney function studies: Secondary | ICD-10-CM | POA: Diagnosis not present

## 2019-06-27 DIAGNOSIS — H5461 Unqualified visual loss, right eye, normal vision left eye: Secondary | ICD-10-CM | POA: Diagnosis not present

## 2019-06-27 DIAGNOSIS — Z87891 Personal history of nicotine dependence: Secondary | ICD-10-CM | POA: Diagnosis not present

## 2019-06-27 DIAGNOSIS — Z4822 Encounter for aftercare following kidney transplant: Secondary | ICD-10-CM | POA: Diagnosis not present

## 2019-06-27 DIAGNOSIS — E1042 Type 1 diabetes mellitus with diabetic polyneuropathy: Secondary | ICD-10-CM | POA: Diagnosis not present

## 2019-06-27 DIAGNOSIS — E1022 Type 1 diabetes mellitus with diabetic chronic kidney disease: Secondary | ICD-10-CM | POA: Diagnosis not present

## 2019-07-03 DIAGNOSIS — E8770 Fluid overload, unspecified: Secondary | ICD-10-CM | POA: Diagnosis not present

## 2019-07-03 DIAGNOSIS — Z792 Long term (current) use of antibiotics: Secondary | ICD-10-CM | POA: Diagnosis not present

## 2019-07-03 DIAGNOSIS — T8619 Other complication of kidney transplant: Secondary | ICD-10-CM | POA: Diagnosis not present

## 2019-07-03 DIAGNOSIS — Z4822 Encounter for aftercare following kidney transplant: Secondary | ICD-10-CM | POA: Diagnosis not present

## 2019-07-03 DIAGNOSIS — D631 Anemia in chronic kidney disease: Secondary | ICD-10-CM | POA: Diagnosis not present

## 2019-07-03 DIAGNOSIS — I120 Hypertensive chronic kidney disease with stage 5 chronic kidney disease or end stage renal disease: Secondary | ICD-10-CM | POA: Diagnosis not present

## 2019-07-03 DIAGNOSIS — D649 Anemia, unspecified: Secondary | ICD-10-CM | POA: Diagnosis not present

## 2019-07-03 DIAGNOSIS — E119 Type 2 diabetes mellitus without complications: Secondary | ICD-10-CM | POA: Diagnosis not present

## 2019-07-03 DIAGNOSIS — Z7984 Long term (current) use of oral hypoglycemic drugs: Secondary | ICD-10-CM | POA: Diagnosis not present

## 2019-07-03 DIAGNOSIS — Z5181 Encounter for therapeutic drug level monitoring: Secondary | ICD-10-CM | POA: Diagnosis not present

## 2019-07-03 DIAGNOSIS — E785 Hyperlipidemia, unspecified: Secondary | ICD-10-CM | POA: Diagnosis not present

## 2019-07-03 DIAGNOSIS — E1022 Type 1 diabetes mellitus with diabetic chronic kidney disease: Secondary | ICD-10-CM | POA: Diagnosis not present

## 2019-07-03 DIAGNOSIS — N186 End stage renal disease: Secondary | ICD-10-CM | POA: Diagnosis not present

## 2019-07-03 DIAGNOSIS — Z79899 Other long term (current) drug therapy: Secondary | ICD-10-CM | POA: Diagnosis not present

## 2019-07-03 DIAGNOSIS — I10 Essential (primary) hypertension: Secondary | ICD-10-CM | POA: Diagnosis not present

## 2019-07-03 DIAGNOSIS — Z7952 Long term (current) use of systemic steroids: Secondary | ICD-10-CM | POA: Diagnosis not present

## 2019-07-06 DIAGNOSIS — Z7952 Long term (current) use of systemic steroids: Secondary | ICD-10-CM | POA: Diagnosis not present

## 2019-07-06 DIAGNOSIS — E119 Type 2 diabetes mellitus without complications: Secondary | ICD-10-CM | POA: Diagnosis not present

## 2019-07-06 DIAGNOSIS — Z792 Long term (current) use of antibiotics: Secondary | ICD-10-CM | POA: Diagnosis not present

## 2019-07-06 DIAGNOSIS — Z79899 Other long term (current) drug therapy: Secondary | ICD-10-CM | POA: Diagnosis not present

## 2019-07-06 DIAGNOSIS — Z794 Long term (current) use of insulin: Secondary | ICD-10-CM | POA: Diagnosis not present

## 2019-07-06 DIAGNOSIS — Z940 Kidney transplant status: Secondary | ICD-10-CM | POA: Diagnosis not present

## 2019-07-06 DIAGNOSIS — D649 Anemia, unspecified: Secondary | ICD-10-CM | POA: Diagnosis not present

## 2019-07-06 DIAGNOSIS — T8619 Other complication of kidney transplant: Secondary | ICD-10-CM | POA: Diagnosis not present

## 2019-07-06 DIAGNOSIS — E785 Hyperlipidemia, unspecified: Secondary | ICD-10-CM | POA: Diagnosis not present

## 2019-07-06 DIAGNOSIS — Z5181 Encounter for therapeutic drug level monitoring: Secondary | ICD-10-CM | POA: Diagnosis not present

## 2019-07-06 DIAGNOSIS — I10 Essential (primary) hypertension: Secondary | ICD-10-CM | POA: Diagnosis not present

## 2019-07-06 DIAGNOSIS — E109 Type 1 diabetes mellitus without complications: Secondary | ICD-10-CM | POA: Diagnosis not present

## 2019-07-06 DIAGNOSIS — Z4822 Encounter for aftercare following kidney transplant: Secondary | ICD-10-CM | POA: Diagnosis not present

## 2019-07-08 DIAGNOSIS — Z7952 Long term (current) use of systemic steroids: Secondary | ICD-10-CM | POA: Diagnosis not present

## 2019-07-08 DIAGNOSIS — D649 Anemia, unspecified: Secondary | ICD-10-CM | POA: Diagnosis not present

## 2019-07-08 DIAGNOSIS — E1065 Type 1 diabetes mellitus with hyperglycemia: Secondary | ICD-10-CM | POA: Diagnosis not present

## 2019-07-08 DIAGNOSIS — T8619 Other complication of kidney transplant: Secondary | ICD-10-CM | POA: Diagnosis not present

## 2019-07-08 DIAGNOSIS — Z794 Long term (current) use of insulin: Secondary | ICD-10-CM | POA: Diagnosis not present

## 2019-07-08 DIAGNOSIS — I151 Hypertension secondary to other renal disorders: Secondary | ICD-10-CM | POA: Diagnosis not present

## 2019-07-08 DIAGNOSIS — E785 Hyperlipidemia, unspecified: Secondary | ICD-10-CM | POA: Diagnosis not present

## 2019-07-08 DIAGNOSIS — Z4822 Encounter for aftercare following kidney transplant: Secondary | ICD-10-CM | POA: Diagnosis not present

## 2019-07-08 DIAGNOSIS — E872 Acidosis: Secondary | ICD-10-CM | POA: Diagnosis not present

## 2019-07-08 DIAGNOSIS — Z79899 Other long term (current) drug therapy: Secondary | ICD-10-CM | POA: Diagnosis not present

## 2019-07-08 DIAGNOSIS — Z940 Kidney transplant status: Secondary | ICD-10-CM | POA: Diagnosis not present

## 2019-07-12 ENCOUNTER — Encounter (HOSPITAL_COMMUNITY): Payer: Self-pay

## 2019-07-12 ENCOUNTER — Other Ambulatory Visit: Payer: Self-pay

## 2019-07-12 ENCOUNTER — Emergency Department (HOSPITAL_COMMUNITY)
Admission: EM | Admit: 2019-07-12 | Discharge: 2019-07-12 | Disposition: A | Discharge status: Other Acute Inpatient Hospital | Payer: Medicare Other | Attending: Emergency Medicine | Admitting: Emergency Medicine

## 2019-07-12 ENCOUNTER — Emergency Department (HOSPITAL_COMMUNITY): Payer: Medicare Other

## 2019-07-12 DIAGNOSIS — I132 Hypertensive heart and chronic kidney disease with heart failure and with stage 5 chronic kidney disease, or end stage renal disease: Secondary | ICD-10-CM | POA: Insufficient documentation

## 2019-07-12 DIAGNOSIS — R509 Fever, unspecified: Secondary | ICD-10-CM | POA: Diagnosis not present

## 2019-07-12 DIAGNOSIS — E1110 Type 2 diabetes mellitus with ketoacidosis without coma: Secondary | ICD-10-CM

## 2019-07-12 DIAGNOSIS — Z794 Long term (current) use of insulin: Secondary | ICD-10-CM | POA: Diagnosis not present

## 2019-07-12 DIAGNOSIS — E1010 Type 1 diabetes mellitus with ketoacidosis without coma: Secondary | ICD-10-CM | POA: Diagnosis not present

## 2019-07-12 DIAGNOSIS — E1065 Type 1 diabetes mellitus with hyperglycemia: Secondary | ICD-10-CM | POA: Diagnosis not present

## 2019-07-12 DIAGNOSIS — R6510 Systemic inflammatory response syndrome (SIRS) of non-infectious origin without acute organ dysfunction: Secondary | ICD-10-CM

## 2019-07-12 DIAGNOSIS — Z940 Kidney transplant status: Secondary | ICD-10-CM | POA: Diagnosis not present

## 2019-07-12 DIAGNOSIS — R7881 Bacteremia: Secondary | ICD-10-CM | POA: Diagnosis not present

## 2019-07-12 DIAGNOSIS — E1122 Type 2 diabetes mellitus with diabetic chronic kidney disease: Secondary | ICD-10-CM | POA: Diagnosis not present

## 2019-07-12 DIAGNOSIS — B965 Pseudomonas (aeruginosa) (mallei) (pseudomallei) as the cause of diseases classified elsewhere: Secondary | ICD-10-CM | POA: Diagnosis not present

## 2019-07-12 DIAGNOSIS — E1165 Type 2 diabetes mellitus with hyperglycemia: Secondary | ICD-10-CM | POA: Diagnosis not present

## 2019-07-12 DIAGNOSIS — Z7984 Long term (current) use of oral hypoglycemic drugs: Secondary | ICD-10-CM | POA: Diagnosis not present

## 2019-07-12 DIAGNOSIS — T8613 Kidney transplant infection: Secondary | ICD-10-CM | POA: Diagnosis not present

## 2019-07-12 DIAGNOSIS — D696 Thrombocytopenia, unspecified: Secondary | ICD-10-CM | POA: Diagnosis not present

## 2019-07-12 DIAGNOSIS — N179 Acute kidney failure, unspecified: Secondary | ICD-10-CM | POA: Diagnosis not present

## 2019-07-12 DIAGNOSIS — Z7982 Long term (current) use of aspirin: Secondary | ICD-10-CM | POA: Diagnosis not present

## 2019-07-12 DIAGNOSIS — Z7952 Long term (current) use of systemic steroids: Secondary | ICD-10-CM | POA: Diagnosis not present

## 2019-07-12 DIAGNOSIS — Z79899 Other long term (current) drug therapy: Secondary | ICD-10-CM | POA: Insufficient documentation

## 2019-07-12 DIAGNOSIS — D649 Anemia, unspecified: Secondary | ICD-10-CM

## 2019-07-12 DIAGNOSIS — E872 Acidosis: Secondary | ICD-10-CM | POA: Diagnosis not present

## 2019-07-12 DIAGNOSIS — Z5181 Encounter for therapeutic drug level monitoring: Secondary | ICD-10-CM | POA: Diagnosis not present

## 2019-07-12 DIAGNOSIS — T8619 Other complication of kidney transplant: Secondary | ICD-10-CM | POA: Diagnosis not present

## 2019-07-12 DIAGNOSIS — E1022 Type 1 diabetes mellitus with diabetic chronic kidney disease: Secondary | ICD-10-CM | POA: Diagnosis not present

## 2019-07-12 DIAGNOSIS — E119 Type 2 diabetes mellitus without complications: Secondary | ICD-10-CM | POA: Diagnosis not present

## 2019-07-12 DIAGNOSIS — E0810 Diabetes mellitus due to underlying condition with ketoacidosis without coma: Secondary | ICD-10-CM | POA: Diagnosis not present

## 2019-07-12 DIAGNOSIS — R6520 Severe sepsis without septic shock: Secondary | ICD-10-CM | POA: Diagnosis not present

## 2019-07-12 DIAGNOSIS — R34 Anuria and oliguria: Secondary | ICD-10-CM | POA: Diagnosis not present

## 2019-07-12 DIAGNOSIS — R109 Unspecified abdominal pain: Secondary | ICD-10-CM | POA: Insufficient documentation

## 2019-07-12 DIAGNOSIS — N186 End stage renal disease: Secondary | ICD-10-CM | POA: Diagnosis not present

## 2019-07-12 DIAGNOSIS — Z87891 Personal history of nicotine dependence: Secondary | ICD-10-CM | POA: Diagnosis not present

## 2019-07-12 DIAGNOSIS — N12 Tubulo-interstitial nephritis, not specified as acute or chronic: Secondary | ICD-10-CM | POA: Diagnosis not present

## 2019-07-12 DIAGNOSIS — E8809 Other disorders of plasma-protein metabolism, not elsewhere classified: Secondary | ICD-10-CM | POA: Diagnosis not present

## 2019-07-12 DIAGNOSIS — D72829 Elevated white blood cell count, unspecified: Secondary | ICD-10-CM | POA: Diagnosis not present

## 2019-07-12 DIAGNOSIS — A4152 Sepsis due to Pseudomonas: Secondary | ICD-10-CM | POA: Diagnosis not present

## 2019-07-12 DIAGNOSIS — Z20828 Contact with and (suspected) exposure to other viral communicable diseases: Secondary | ICD-10-CM | POA: Diagnosis not present

## 2019-07-12 DIAGNOSIS — E669 Obesity, unspecified: Secondary | ICD-10-CM | POA: Diagnosis not present

## 2019-07-12 DIAGNOSIS — I5032 Chronic diastolic (congestive) heart failure: Secondary | ICD-10-CM | POA: Insufficient documentation

## 2019-07-12 DIAGNOSIS — S301XXA Contusion of abdominal wall, initial encounter: Secondary | ICD-10-CM | POA: Diagnosis not present

## 2019-07-12 DIAGNOSIS — E1039 Type 1 diabetes mellitus with other diabetic ophthalmic complication: Secondary | ICD-10-CM | POA: Diagnosis not present

## 2019-07-12 DIAGNOSIS — E869 Volume depletion, unspecified: Secondary | ICD-10-CM | POA: Diagnosis not present

## 2019-07-12 DIAGNOSIS — E871 Hypo-osmolality and hyponatremia: Secondary | ICD-10-CM | POA: Diagnosis not present

## 2019-07-12 DIAGNOSIS — I151 Hypertension secondary to other renal disorders: Secondary | ICD-10-CM | POA: Diagnosis not present

## 2019-07-12 DIAGNOSIS — A419 Sepsis, unspecified organism: Secondary | ICD-10-CM | POA: Diagnosis not present

## 2019-07-12 DIAGNOSIS — R188 Other ascites: Secondary | ICD-10-CM | POA: Diagnosis not present

## 2019-07-12 DIAGNOSIS — K529 Noninfective gastroenteritis and colitis, unspecified: Secondary | ICD-10-CM | POA: Diagnosis not present

## 2019-07-12 DIAGNOSIS — Z452 Encounter for adjustment and management of vascular access device: Secondary | ICD-10-CM | POA: Diagnosis not present

## 2019-07-12 DIAGNOSIS — D62 Acute posthemorrhagic anemia: Secondary | ICD-10-CM | POA: Diagnosis not present

## 2019-07-12 DIAGNOSIS — Z4822 Encounter for aftercare following kidney transplant: Secondary | ICD-10-CM | POA: Diagnosis not present

## 2019-07-12 DIAGNOSIS — H3340 Traction detachment of retina, unspecified eye: Secondary | ICD-10-CM | POA: Diagnosis not present

## 2019-07-12 DIAGNOSIS — N390 Urinary tract infection, site not specified: Secondary | ICD-10-CM | POA: Diagnosis not present

## 2019-07-12 DIAGNOSIS — I10 Essential (primary) hypertension: Secondary | ICD-10-CM | POA: Diagnosis not present

## 2019-07-12 DIAGNOSIS — Z6831 Body mass index (BMI) 31.0-31.9, adult: Secondary | ICD-10-CM | POA: Diagnosis not present

## 2019-07-12 DIAGNOSIS — E1042 Type 1 diabetes mellitus with diabetic polyneuropathy: Secondary | ICD-10-CM | POA: Diagnosis not present

## 2019-07-12 DIAGNOSIS — Z992 Dependence on renal dialysis: Secondary | ICD-10-CM | POA: Diagnosis not present

## 2019-07-12 DIAGNOSIS — R1031 Right lower quadrant pain: Secondary | ICD-10-CM | POA: Diagnosis not present

## 2019-07-12 DIAGNOSIS — H5461 Unqualified visual loss, right eye, normal vision left eye: Secondary | ICD-10-CM | POA: Diagnosis not present

## 2019-07-12 LAB — CBC WITH DIFFERENTIAL/PLATELET
Abs Immature Granulocytes: 1.41 10*3/uL — ABNORMAL HIGH (ref 0.00–0.07)
Basophils Absolute: 0 10*3/uL (ref 0.0–0.1)
Basophils Relative: 0 %
Eosinophils Absolute: 0 10*3/uL (ref 0.0–0.5)
Eosinophils Relative: 0 %
HCT: 20.7 % — ABNORMAL LOW (ref 39.0–52.0)
Hemoglobin: 6.5 g/dL — CL (ref 13.0–17.0)
Immature Granulocytes: 8 %
Lymphocytes Relative: 0 %
Lymphs Abs: 0 10*3/uL — ABNORMAL LOW (ref 0.7–4.0)
MCH: 24.2 pg — ABNORMAL LOW (ref 26.0–34.0)
MCHC: 31.4 g/dL (ref 30.0–36.0)
MCV: 77 fL — ABNORMAL LOW (ref 80.0–100.0)
Monocytes Absolute: 0.3 10*3/uL (ref 0.1–1.0)
Monocytes Relative: 2 %
Neutro Abs: 16 10*3/uL — ABNORMAL HIGH (ref 1.7–7.7)
Neutrophils Relative %: 90 %
Platelets: 132 10*3/uL — ABNORMAL LOW (ref 150–400)
RBC: 2.69 MIL/uL — ABNORMAL LOW (ref 4.22–5.81)
RDW: 16 % — ABNORMAL HIGH (ref 11.5–15.5)
WBC: 17.8 10*3/uL — ABNORMAL HIGH (ref 4.0–10.5)
nRBC: 0 % (ref 0.0–0.2)

## 2019-07-12 LAB — PROTIME-INR
INR: 1.3 — ABNORMAL HIGH (ref 0.8–1.2)
Prothrombin Time: 16.4 seconds — ABNORMAL HIGH (ref 11.4–15.2)

## 2019-07-12 LAB — APTT: aPTT: 29 seconds (ref 24–36)

## 2019-07-12 LAB — COMPREHENSIVE METABOLIC PANEL
ALT: 18 U/L (ref 0–44)
AST: 16 U/L (ref 15–41)
Albumin: 2.7 g/dL — ABNORMAL LOW (ref 3.5–5.0)
Alkaline Phosphatase: 100 U/L (ref 38–126)
Anion gap: 20 — ABNORMAL HIGH (ref 5–15)
BUN: 114 mg/dL — ABNORMAL HIGH (ref 6–20)
CO2: 14 mmol/L — ABNORMAL LOW (ref 22–32)
Calcium: 9 mg/dL (ref 8.9–10.3)
Chloride: 93 mmol/L — ABNORMAL LOW (ref 98–111)
Creatinine, Ser: 13.58 mg/dL — ABNORMAL HIGH (ref 0.61–1.24)
GFR calc Af Amer: 4 mL/min — ABNORMAL LOW (ref >60)
GFR calc non Af Amer: 4 mL/min — ABNORMAL LOW (ref >60)
Glucose, Bld: 544 mg/dL (ref 70–99)
Potassium: 4.7 mmol/L (ref 3.5–5.1)
Sodium: 127 mmol/L — ABNORMAL LOW (ref 135–145)
Total Bilirubin: 0.7 mg/dL (ref 0.3–1.2)
Total Protein: 6.6 g/dL (ref 6.5–8.1)

## 2019-07-12 LAB — BLOOD GAS, VENOUS
Acid-base deficit: 12.1 mmol/L — ABNORMAL HIGH (ref 0.0–2.0)
Bicarbonate: 14.5 mmol/L — ABNORMAL LOW (ref 20.0–28.0)
FIO2: 21
O2 Saturation: 72.5 %
Patient temperature: 37
pCO2, Ven: 35.5 mmHg — ABNORMAL LOW (ref 44.0–60.0)
pH, Ven: 7.223 — ABNORMAL LOW (ref 7.250–7.430)
pO2, Ven: 48.5 mmHg — ABNORMAL HIGH (ref 32.0–45.0)

## 2019-07-12 LAB — I-STAT CHEM 8, ED
BUN: 111 mg/dL — ABNORMAL HIGH (ref 6–20)
Calcium, Ion: 1.21 mmol/L (ref 1.15–1.40)
Chloride: 95 mmol/L — ABNORMAL LOW (ref 98–111)
Creatinine, Ser: 13.8 mg/dL — ABNORMAL HIGH (ref 0.61–1.24)
Glucose, Bld: 526 mg/dL (ref 70–99)
HCT: 23 % — ABNORMAL LOW (ref 39.0–52.0)
Hemoglobin: 7.8 g/dL — ABNORMAL LOW (ref 13.0–17.0)
Potassium: 4.6 mmol/L (ref 3.5–5.1)
Sodium: 125 mmol/L — ABNORMAL LOW (ref 135–145)
TCO2: 15 mmol/L — ABNORMAL LOW (ref 22–32)

## 2019-07-12 LAB — GLUCOSE, CAPILLARY: Glucose-Capillary: 466 mg/dL — ABNORMAL HIGH (ref 70–99)

## 2019-07-12 LAB — CBG MONITORING, ED: Glucose-Capillary: 512 mg/dL (ref 70–99)

## 2019-07-12 LAB — BETA-HYDROXYBUTYRIC ACID: Beta-Hydroxybutyric Acid: 0.09 mmol/L (ref 0.05–0.27)

## 2019-07-12 LAB — LACTIC ACID, PLASMA: Lactic Acid, Venous: 1.6 mmol/L (ref 0.5–1.9)

## 2019-07-12 LAB — POC SARS CORONAVIRUS 2 AG -  ED: SARS Coronavirus 2 Ag: NEGATIVE

## 2019-07-12 MED ORDER — METRONIDAZOLE IN NACL 5-0.79 MG/ML-% IV SOLN: 500.0000 mg | Freq: Once | INTRAVENOUS | Status: DC

## 2019-07-12 MED ORDER — SODIUM CHLORIDE 0.9 % IV BOLUS (SEPSIS): 1000.0000 mL | Freq: Once | INTRAVENOUS | Status: DC

## 2019-07-12 MED ORDER — PIPERACILLIN-TAZOBACTAM 3.375 G IVPB 30 MIN: 3.3750 g | INTRAVENOUS | Status: DC

## 2019-07-12 MED ORDER — DEXTROSE-NACL 5-0.45 % IV SOLN: INTRAVENOUS | Status: DC

## 2019-07-12 MED ORDER — DEXTROSE 50 % IV SOLN: 0.0000 mL | INTRAVENOUS | Status: DC | PRN

## 2019-07-12 MED ORDER — VANCOMYCIN HCL IN DEXTROSE 1-5 GM/200ML-% IV SOLN
1000.0000 mg | Freq: Once | INTRAVENOUS | Status: AC
  Administered 2019-07-12: 1000 mg via INTRAVENOUS
  Filled 2019-07-12: qty 200

## 2019-07-12 MED ORDER — POTASSIUM CHLORIDE 10 MEQ/100ML IV SOLN
10.0000 meq | Freq: Once | INTRAVENOUS | Status: AC
  Administered 2019-07-12: 10 meq via INTRAVENOUS
  Filled 2019-07-12: qty 100

## 2019-07-12 MED ORDER — SODIUM CHLORIDE 0.9 % IV SOLN: INTRAVENOUS | Status: DC

## 2019-07-12 MED ORDER — PIPERACILLIN-TAZOBACTAM 3.375 G IVPB 30 MIN
3.3750 g | Freq: Once | INTRAVENOUS | Status: AC
  Administered 2019-07-12: 3.375 g via INTRAVENOUS
  Filled 2019-07-12: qty 50

## 2019-07-12 MED ORDER — VANCOMYCIN HCL 500 MG IV SOLR
500.0000 mg | Freq: Once | INTRAVENOUS | Status: DC
  Filled 2019-07-12: qty 500

## 2019-07-12 MED ORDER — SODIUM CHLORIDE 0.9 % IV BOLUS (SEPSIS)
1000.0000 mL | Freq: Once | INTRAVENOUS | Status: AC
  Administered 2019-07-12: 1000 mL via INTRAVENOUS

## 2019-07-12 MED ORDER — INSULIN REGULAR(HUMAN) IN NACL 100-0.9 UT/100ML-% IV SOLN
INTRAVENOUS | Status: DC
  Filled 2019-07-12: qty 100

## 2019-07-12 MED ORDER — PIPERACILLIN-TAZOBACTAM IN DEX 2-0.25 GM/50ML IV SOLN
2.2500 g | Freq: Once | INTRAVENOUS | Status: DC
  Filled 2019-07-12: qty 50

## 2019-07-12 MED ORDER — SODIUM CHLORIDE 0.9 % IV SOLN: 2.0000 g | Freq: Once | INTRAVENOUS | Status: DC

## 2019-07-12 MED ORDER — PIPERACILLIN-TAZOBACTAM 3.375 G IVPB: 3.3750 g | Freq: Two times a day (BID) | INTRAVENOUS | Status: DC

## 2019-07-12 NOTE — Progress Notes (Signed)
Notified bedside nurse of need to administer antibiotics.  

## 2019-07-12 NOTE — ED Notes (Signed)
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 No S/S of infection .Severe right abdominal pain and fever since friday

## 2019-07-12 NOTE — ED Notes (Signed)
Date and time results received: 07/12/19   Test: hgb6.5 Glucose 544  Critical Value:   Name of Provider Notified: Kennith Maes  Orders Received? Or Actions Taken?:

## 2019-07-12 NOTE — ED Notes (Signed)
Air care left with pt

## 2019-07-12 NOTE — ED Triage Notes (Signed)
Pt presents to ED with complaints of fever at home since Friday up to 104.7 and hyperglycemia. Pt had kidney transplant on 11/14.

## 2019-07-12 NOTE — ED Notes (Signed)
[facility] transport line called to transport patient to ED. Accepting Dr. Linward Foster.

## 2019-07-12 NOTE — Progress Notes (Signed)
Pharmacy Antibiotic Note  Alejandro Lee is a 58 y.o. male admitted on 07/12/2019 with sepsis.  Pharmacy has been consulted for vancomycin and zosyn dosing.  Plan:vancomycin 1.5gm iv x 1  Following pharmacist to order dosing once renal status established Goal trough 15-20 mcg/mL. zosyn 3.375gm EID iv q12h  Height: 5\' 7"  (170.2 cm) Weight: 196 lb (88.9 kg) IBW/kg (Calculated) : 66.1  Temp (24hrs), Avg:98.6 F (37 C), Min:98.3 F (36.8 C), Max:98.8 F (37.1 C)  Recent Labs  Lab 07/12/19 1504 07/12/19 1505 07/12/19 1518  WBC 17.8*  --   --   CREATININE 13.58*  --  13.80*  LATICACIDVEN  --  1.6  --     Estimated Creatinine Clearance: 6.4 mL/min (A) (by C-G formula based on SCr of 13.8 mg/dL (H)).    No Known Allergies  Antimicrobials this admission: 11/29 zosyn >>  11/29 vancomycin >>   Microbiology results: 11/29 BCx: sent    Thank you for allowing pharmacy to be a part of this patient's care.  Donna Christen Tristina Sahagian 07/12/2019 4:28 PM

## 2019-07-12 NOTE — ED Provider Notes (Signed)
[facility] EMERGENCY DEPARTMENT Provider Note   CSN: [id number] Arrival date & time: 07/12/19  1411     History   Chief Complaint Chief Complaint  Patient presents with   Fever   Hyperglycemia   Abdominal Pain    HPI Alejandro Lee is a 58 y.o. male with a history of anemia, diabetes mellitus, hypertension, hyperlipidemia, prior DVT,  CHF & CKD previously on dialysis who is s/p kidney transplant 06/27/2019 by Dr. Nolon Lennert @ [facility] who presents to the ED with complaints of abdominal pain for the past 3 days. Patient states he has had pain to the RUQ/RLQ primarily with movement/position changes, improved with remaining very still. He has had associated fever with temp max 104.7 @ home, chills, & hyperglycemia. He continues to have urine output. Has had 2 episodes of emesis. He initially did not want to come to the hospital and this AM his wife convinced him to allow her to call the transplant coordinator who recommended ER evaluation. Denies chest pain, dyspnea, cough, dysuria, frequency, or urgency.   Currently on Tacrolimus, Mycophenolate, and Prednisone for anti-rejection purposes per his med list his wife has bought with him.      HPI  Past Medical History:  Diagnosis Date   Anemia    1 blood transfusion   Arthritis    Back pain    CHF (congestive heart failure) (HCC)    Chronic kidney disease    dialysis M/W/F   Diabetes mellitus without complication (HCC)    DVT (deep venous thrombosis) (HCC)    GERD (gastroesophageal reflux disease)    Headache    migraines   History of cardiovascular stress test 11/2017    [facility]) negative dobutamine stress test   Hyperlipidemia    Hypertension    Legally blind    right   Myocardial infarction [facility])    patient states it was seen on EKG, denies a cardiac cath    Patient Active Problem List   Diagnosis Date Noted   ESRD on hemodialysis ([place])    Hyperkalemia 06/25/2018   Non-compliance with renal  dialysis ([place]) 06/25/2018   Generalized abdominal pain 06/25/2018   Chronic back pain 06/25/2018   GERD (gastroesophageal reflux disease) 06/25/2018   Chronic diastolic HF (heart failure) ([place]) 06/25/2018   Class 1 obesity due to excess calories in adult 06/25/2018   Benign essential HTN 06/25/2018   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[place]) 12/25/2017   Type 2 diabetes mellitus with other specified complication ([place]) [id number]   ESRD (end stage renal disease) on dialysis ([place]) 12/19/2017   Chronic right-sided low back pain without sciatica 12/19/2017   Pseudophakia of left eye 09/03/2017   PDR (proliferative diabetic retinopathy) ([place]) 09/03/2017   Cataract in degenerative disorder 09/03/2017   Unspecified complication of internal prosthetic device, implant and graft, initial encounter 05/17/2017   Rectal abscess 05/17/2017   Osteopathy in diseases classified elsewhere, unspecified site [id number]   Metabolic disorder [id number]   Hypertension 05/16/2017   Dependence on renal dialysis ([place]) 05/16/2017   Anemia of chronic renal failure, stage 5 ([place]) 05/16/2017   Retinal detachment, tractional, both eyes 06/19/2012    Past Surgical History:  Procedure Laterality Date   A/V FISTULAGRAM Right 07/22/2018   Procedure: A/V FISTULAGRAM;  Surgeon: Serafina Mitchell, MD;  Location: [place] CV LAB;  Service: Cardiovascular;  Laterality: Right;   AV FISTULA PLACEMENT Right    AV FISTULA PLACEMENT Right [id number]   Procedure: PLICATION OF RIGHT arm FISTULA;  Surgeon: Waynetta Sandy, MD;  Location: [facility] OR;  Service: Vascular;  Laterality: Right;   [place] Left 02/05/2019   Procedure: BRACHIOCEPHALIC FISTULA  LEFT ARM;  Surgeon: Serafina Mitchell, MD;  Location: [facility] OR;  Service: Vascular;  Laterality: Left;   COLONOSCOPY     diabetic cyst removal Bilateral    on buttocks x8   EYE SURGERY     FISTULA SUPERFICIALIZATION Right  [id number]   Procedure: PLICATION OF ARTERIOVENOUS FISTULA RIGHT ARM;  Surgeon: Conrad Hollansburg, MD;  Location: [facility] OR;  Service: Vascular;  Laterality: Right;   lens replacement Left    LIGATION OF ARTERIOVENOUS  FISTULA  03/19/2019   Procedure: LIGATION OF ARTERIOVENOUS  OF COMPETING BRANCHES OF FISTULA LEFT UPPER ARM;  Surgeon: Serafina Mitchell, MD;  Location: [facility] OR;  Service: Vascular;;   LIGATION OF ARTERIOVENOUS  FISTULA Right 05/07/2019   Procedure: LIGATION OF ARTERIOVENOUS  FISTULA RIGHT ARM;  Surgeon: Serafina Mitchell, MD;  Location: [place];  Service: Vascular;  Laterality: Right;   NEPHRECTOMY TRANSPLANTED ORGAN     PERIPHERAL VASCULAR BALLOON ANGIOPLASTY  07/22/2018   Procedure: PERIPHERAL VASCULAR BALLOON ANGIOPLASTY;  Surgeon: Serafina Mitchell, MD;  Location: [place] CV LAB;  Service: Cardiovascular;;  Right [place] fistula    PERIPHERAL VASCULAR BALLOON ANGIOPLASTY Right 02/03/2019   Procedure: PERIPHERAL VASCULAR BALLOON ANGIOPLASTY;  Surgeon: Serafina Mitchell, MD;  Location: [place] CV LAB;  Service: Cardiovascular;  Laterality: Right;  RUE AVF   REFRACTIVE SURGERY Bilateral         Home Medications    Prior to Admission medications   Medication Sig Start Date End Date Taking? Authorizing Provider  acetaminophen (TYLENOL) 500 MG tablet Take 1,000 mg by mouth every 6 (six) hours as needed for moderate pain.    [provider]  albuterol (VENTOLIN HFA) 108 (90 Base) MCG/ACT inhaler Inhale 2 puffs into the lungs every 6 (six) hours as needed for wheezing or shortness of breath.    [provider]  amLODipine (NORVASC) 5 MG tablet Take 1 tablet (5 mg total) by mouth daily. 06/26/18   Barton Dubois, MD  Aspirin-Caffeine (BAYER BACK & BODY PAIN EX ST PO) Take 1 tablet by mouth daily as needed (pain).    [provider]  calcium acetate (PHOSLO) 667 MG capsule Take 2,001 mg by mouth 3 (three) times daily with meals.     [provider]    Cholecalciferol (VITAMIN D3) 10 MCG (400 UNIT) tablet Take 400 Units by mouth daily.    [provider]  lidocaine-prilocaine (EMLA) cream Apply 1 application topically as needed (port acces).    [provider]  loperamide (IMODIUM A-D) 2 MG tablet Take 2 mg by mouth 4 (four) times daily as needed for diarrhea or loose stools.    [provider]  oxyCODONE-acetaminophen (PERCOCET/ROXICET) 5-325 MG tablet Take 1 tablet by mouth every 4 (four) hours as needed for severe pain. 04/13/19   Serafina Mitchell, MD  sitaGLIPtin (JANUVIA) 25 MG tablet Take 25 mg by mouth daily.    [provider]  sorbitol 70 % solution Take 15 mLs by mouth daily as needed (constipation).     [provider]    Family History Family History  Problem Relation Age of Onset   Diabetes Mother    Hypertension Mother    Diabetes Father    Heart disease Father    Cancer Father    Hypertension Father  Diabetes Sister    Hypertension Sister    Diabetes Brother    Hypertension Brother    Cancer Brother     Social History Social History   Tobacco Use   Smoking status: Former Smoker    Types: Cigarettes   Smokeless tobacco: Never Used  Substance Use Topics   Alcohol use: Not Currently   Drug use: Never     Allergies   Patient has no known allergies.   Review of Systems Review of Systems  Constitutional: Positive for chills and fever.  Respiratory: Negative for cough and shortness of breath.   Cardiovascular: Negative for chest pain.  Gastrointestinal: Positive for abdominal pain, nausea and vomiting. Negative for constipation and diarrhea.  Genitourinary: Negative for dysuria, frequency and hematuria.  Neurological: Negative for syncope.  All other systems reviewed and are negative.   Physical Exam Updated Vital Signs BP (!) 92/43 (BP Location: Right Arm)    Pulse 75    Temp 98.3 F (36.8 C) (Oral)    Resp 18    Ht 5\' 7"  (1.702 m)    Wt  88.9 kg    SpO2 93%    BMI 30.70 kg/m   Physical Exam Vitals signs and nursing note reviewed.  Constitutional:      General: He is in acute distress (appears uncomfortable with position changes).     Appearance: He is well-developed. He is ill-appearing.  HENT:     Head: Normocephalic and atraumatic.  Eyes:     General:        Right eye: No discharge.        Left eye: No discharge.     Conjunctiva/sclera: Conjunctivae normal.  Neck:     Musculoskeletal: Neck supple.  Cardiovascular:     Rate and Rhythm: Normal rate and regular rhythm.  Pulmonary:     Effort: Tachypnea present. No respiratory distress.     Breath sounds: Normal breath sounds. No wheezing, rhonchi or rales.  Abdominal:     General: There is no distension.     Palpations: Abdomen is soft.     Tenderness: There is abdominal tenderness (RUQ & RLQ, worse in RUQ). There is no guarding.     Comments: RLQ surgical scar with staples in place. No purulent drainage, erythema, or warmth.   Musculoskeletal:     Comments: Left upper extremity AV fistula with palpable thrill.  Right upper extremity AV fistula without palpable thrill.   Skin:    General: Skin is warm and dry.     Findings: No rash.  Neurological:     Mental Status: He is alert.     Comments: Clear speech.  He is oriented to person place and time but does seem a bit confused/slow to answer questions.  Psychiatric:        Behavior: Behavior normal.    ED Treatments / Results  Labs (all labs ordered are listed, but only abnormal results are displayed) Labs Reviewed  COMPREHENSIVE METABOLIC PANEL - Abnormal; Notable for the following components:      Result Value   Sodium 127 (*)    Chloride 93 (*)    CO2 14 (*)    Glucose, Bld 544 (*)    BUN 114 (*)    Creatinine, Ser 13.58 (*)    Albumin 2.7 (*)    GFR calc non Af Amer 4 (*)    GFR calc Af Amer 4 (*)    Anion gap 20 (*)    All other components  within normal limits  CBC WITH DIFFERENTIAL/PLATELET  - Abnormal; Notable for the following components:   WBC 17.8 (*)    RBC 2.69 (*)    Hemoglobin 6.5 (*)    HCT 20.7 (*)    MCV 77.0 (*)    MCH 24.2 (*)    RDW 16.0 (*)    Platelets 132 (*)    Neutro Abs 16.0 (*)    Lymphs Abs 0.0 (*)    Abs Immature Granulocytes 1.41 (*)    All other components within normal limits  PROTIME-INR - Abnormal; Notable for the following components:   Prothrombin Time 16.4 (*)    INR 1.3 (*)    All other components within normal limits  GLUCOSE, CAPILLARY - Abnormal; Notable for the following components:   Glucose-Capillary 466 (*)    All other components within normal limits  BLOOD GAS, VENOUS - Abnormal; Notable for the following components:   pH, Ven 7.223 (*)    pCO2, Ven 35.5 (*)    pO2, Ven 48.5 (*)    Bicarbonate 14.5 (*)    Acid-base deficit 12.1 (*)    All other components within normal limits  I-STAT CHEM 8, ED - Abnormal; Notable for the following components:   Sodium 125 (*)    Chloride 95 (*)    BUN 111 (*)    Creatinine, Ser 13.80 (*)    Glucose, Bld 526 (*)    TCO2 15 (*)    Hemoglobin 7.8 (*)    HCT 23.0 (*)    All other components within normal limits  CBG MONITORING, ED - Abnormal; Notable for the following components:   Glucose-Capillary 512 (*)    All other components within normal limits  CULTURE, BLOOD (ROUTINE X 2)  CULTURE, BLOOD (ROUTINE X 2)  URINE CULTURE  LACTIC ACID, PLASMA  APTT  BETA-HYDROXYBUTYRIC ACID  LACTIC ACID, PLASMA  URINALYSIS, ROUTINE W REFLEX MICROSCOPIC  BETA-HYDROXYBUTYRIC ACID  POC SARS CORONAVIRUS 2 AG -  ED  I-STAT VENOUS BLOOD GAS, ED    EKG EKG Interpretation  Date/Time:  Sunday July 12 2019 15:07:28 EST Ventricular Rate:  72 PR Interval:    QRS Duration: 91 QT Interval:  425 QTC Calculation: 466 R Axis:   42 Text Interpretation: Sinus rhythm No significant change since last tracing Confirmed by Isla Pence [phone]) on 07/12/2019 3:40:43 PM   Radiology Dg Chest 2  View  Result Date: 07/12/2019 CLINICAL DATA:  Fever.  Status post renal transplant EXAM: CHEST - 2 VIEW COMPARISON:  May 14, 2019 FINDINGS: Lungs are clear. Heart is slightly enlarged with pulmonary vascularity normal. No adenopathy. There are areas of bony sclerosis, likely due to previous renal failure. IMPRESSION: No edema or consolidation. Mild cardiac enlargement. No adenopathy. Electronically Signed   By: Lowella Grip III M.D.   On: 07/12/2019 14:35    Procedures .Critical Care Performed by: Amaryllis Dyke, PA-C Authorized by: Amaryllis Dyke, PA-C    CRITICAL CARE Performed by: Kennith Maes   Total critical care time: 50 minutes  Critical care time was exclusive of separately billable procedures and treating other patients.  Critical care was necessary to treat or prevent imminent or life-threatening deterioration.  Critical care was time spent personally by me on the following activities: development of treatment plan with patient and/or surrogate as well as nursing, discussions with consultants, evaluation of patient's response to treatment, examination of patient, obtaining history from patient or surrogate, ordering and performing treatments and interventions, ordering and  review of laboratory studies, ordering and review of radiographic studies, pulse oximetry and re-evaluation of patient's condition.    (including critical care time)  Medications Ordered in ED Medications  sodium chloride 0.9 % bolus 1,000 mL (1,000 mLs Intravenous New Bag/Given 07/12/19 1518)    And  sodium chloride 0.9 % bolus 1,000 mL (1,000 mLs Intravenous New Bag/Given 07/12/19 1519)    And  sodium chloride 0.9 % bolus 1,000 mL (1,000 mLs Intravenous New Bag/Given 07/12/19 1619)  vancomycin (VANCOCIN) IVPB 1000 mg/200 mL premix (1,000 mg Intravenous New Bag/Given 07/12/19 1546)  insulin regular, human (MYXREDLIN) 100 units/ 100 mL infusion (has no administration in  time range)  0.9 %  sodium chloride infusion (has no administration in time range)  dextrose 5 %-0.45 % sodium chloride infusion (has no administration in time range)  dextrose 50 % solution 0-50 mL (has no administration in time range)  potassium chloride 10 mEq in 100 mL IVPB (10 mEq Intravenous New Bag/Given 07/12/19 1547)  piperacillin-tazobactam (ZOSYN) IVPB 3.375 g (has no administration in time range)  vancomycin (VANCOCIN) 500 mg in sodium chloride 0.9 % 100 mL IVPB (has no administration in time range)  piperacillin-tazobactam (ZOSYN) IVPB 3.375 g (0 g Intravenous Stopped 07/12/19 1617)     Prior results:  UR renal transplant right 07/06/19:  CONCLUSION:  1.  Peritransplant fluid collection near the upper pole measuring 7.0 x 3.5 x 5.0 cm. While not demonstrated on the prior comparison studies, this region was partially obscured by shadowing previously. No significant mass effect on the adjacent transplant parenchyma. 2.  Patent transplant vasculature with similarly mildly elevated resistive indices. 3.  No transplant renal hydronephrosis.  Echocardiogram  12/05/2017:  SUMMARY The left ventricular size is normal. There is mild concentric left ventricular hypertrophy.  Left ventricular systolic function is normal. LV ejection fraction = 55-60%. The right ventricle is normal in size and function. The left atrium is mildly dilated. The right atrium is mildly dilated. There is aortic valve sclerosis. There is mild aortic regurgitation. There is mild mitral regurgitation. No significant stenosis seen Estimated right ventricular systolic pressure is 47 mmHg. Estimated right atrial pressure is 5 mmHg.. Mild to moderate pulmonary hypertension. There is no pericardial effusion. There is no comparison study available  Initial Impression / Assessment and Plan / ED Course  I have reviewed the triage vital signs and the nursing notes.  Pertinent labs & imaging results that were  available during my care of the patient were reviewed by me and considered in my medical decision making (see chart for details).   Patient with recent R renal transplant 06/27/19 @ [facility] presents with abdominal pain, fevers, & hyperglycemia. Patient is ill appearing. Vitals notable for hypotension with MAP of 58, he appears tachypneic. Code sepsis initiated, 30 cc/kg bolus, broad spectrum abx. Will discuss with [facility].   14:58: Consult placed with [facility] transfer line to initiate transfer.  15:04: CONSULT: Discussed with Dr. Linward Foster with transplant service- accepts patient in transfer- ED to ED, requesting vanc/zosyn for broad spectrum abx- these were adjusted from our broad spectrum coverage order set. Plan for our local team to transport pending rapid covid swab.   15:25: I-stat VBG concerning for DKA- will start insulin drip per endotool, I-stat chem 8 with significantly elevated creatinine concerning for rejection.  EKG: NSR, no significant change from last tracing.  CXR: No edema or consolidation. Mild cardiac enlargement. No adenopathy. CBC: Leukocytosis @ 17.8 with left shift. Anemia w/ hgb 6.5 hct  20.7- most recent labs on record hgb appears to be running in the 7s, will hold off on any intervention in this regards. Thrombocytopenia more so than previous.  CMP: Creatinine elevated @ 13.58- very concerning as last on record was 4.66 on 07/08/19. Electrolyte abnormalities as above. Hyperglycemia @ 544, bicarb 14, gap 20- consistent w/ DKA, insulin drip has already been ordered.  Lactic acid; WNL PT/INR: Mildly elevated APTT: WNL Rapid covid testing: Negative  Informed from secreatry that care link unable to transport until tomorrow, re-discussed with [facility]- they can arrange transport- arrival in 2 hours.   Patient & his wife @ bedside updated on results & plan of care, provided opportunity for questions, patient and his wife confirmed understanding and are in agreement.  Sepsis - Repeat  Assessment Performed at: 16:50 Vitals Blood pressure (!) 98/50, pulse 70, temperature 98.6 F (37 C), resp. rate (!) 40, height 5\' 7"  (1.702 m), weight 88.9 kg, SpO2 99 %. Heart: Regular rate and rhythm Lungs: tachypneic, placed on 2L via [place] for tachypnea & SpO2 hovering upper 80s to lower 90s, CTA.   Capillary Refill:<2 sec Peripheral Pulse:Posterior tibialis pulse  palpable Skin:Normal color, somewhat diaphoretic.   17:05: Transport @ bedside.   This is a shared visit with supervising physician Dr. Gilford Raid who has independently evaluated patient & provided guidance in evaluation/management/disposition, in agreement with care    Marciano Sequin was evaluated in Emergency Department on 07/12/2019 for the symptoms described in the history of present illness. He/she was evaluated in the context of the global COVID-19 pandemic, which necessitated consideration that the patient might be at risk for infection with the SARS-CoV-2 virus that causes COVID-19. Institutional protocols and algorithms that pertain to the evaluation of patients at risk for COVID-19 are in a state of rapid change based on information released by regulatory bodies including the CDC and federal and state organizations. These policies and algorithms were followed during the patient's care in the ED.      Final Clinical Impressions(s) / ED Diagnoses   Final diagnoses:  Renal transplant recipient  SIRS (systemic inflammatory response syndrome) ([place])  Diabetic ketoacidosis without coma associated with type 2 diabetes mellitus ([place])  Anemia, unspecified type    ED Discharge Orders    None       Amaryllis Dyke, PA-C 07/12/19 1713    Isla Pence, MD 07/13/19 2215

## 2019-07-12 NOTE — ED Notes (Signed)
O2 at 2 L/via [place] for sats 91 %

## 2019-07-13 DIAGNOSIS — N186 End stage renal disease: Secondary | ICD-10-CM | POA: Diagnosis not present

## 2019-07-13 DIAGNOSIS — Z992 Dependence on renal dialysis: Secondary | ICD-10-CM | POA: Diagnosis not present

## 2019-07-13 LAB — BLOOD CULTURE ID PANEL (REFLEXED)

## 2019-07-15 DIAGNOSIS — R188 Other ascites: Secondary | ICD-10-CM | POA: Diagnosis not present

## 2019-07-15 DIAGNOSIS — Z940 Kidney transplant status: Secondary | ICD-10-CM | POA: Diagnosis not present

## 2019-07-15 LAB — CULTURE, BLOOD (ROUTINE X 2)
Special Requests: ADEQUATE
Special Requests: ADEQUATE

## 2019-07-18 DIAGNOSIS — Z9181 History of falling: Secondary | ICD-10-CM | POA: Diagnosis not present

## 2019-07-18 DIAGNOSIS — Z452 Encounter for adjustment and management of vascular access device: Secondary | ICD-10-CM | POA: Diagnosis not present

## 2019-07-18 DIAGNOSIS — I129 Hypertensive chronic kidney disease with stage 1 through stage 4 chronic kidney disease, or unspecified chronic kidney disease: Secondary | ICD-10-CM | POA: Diagnosis not present

## 2019-07-18 DIAGNOSIS — Z792 Long term (current) use of antibiotics: Secondary | ICD-10-CM | POA: Diagnosis not present

## 2019-07-18 DIAGNOSIS — A4152 Sepsis due to Pseudomonas: Secondary | ICD-10-CM | POA: Diagnosis not present

## 2019-07-18 DIAGNOSIS — E1142 Type 2 diabetes mellitus with diabetic polyneuropathy: Secondary | ICD-10-CM | POA: Diagnosis not present

## 2019-07-18 DIAGNOSIS — N390 Urinary tract infection, site not specified: Secondary | ICD-10-CM | POA: Diagnosis not present

## 2019-07-18 DIAGNOSIS — Z87891 Personal history of nicotine dependence: Secondary | ICD-10-CM | POA: Diagnosis not present

## 2019-07-18 DIAGNOSIS — N189 Chronic kidney disease, unspecified: Secondary | ICD-10-CM | POA: Diagnosis not present

## 2019-07-18 DIAGNOSIS — Z794 Long term (current) use of insulin: Secondary | ICD-10-CM | POA: Diagnosis not present

## 2019-07-18 DIAGNOSIS — E1122 Type 2 diabetes mellitus with diabetic chronic kidney disease: Secondary | ICD-10-CM | POA: Diagnosis not present

## 2019-07-18 DIAGNOSIS — Z940 Kidney transplant status: Secondary | ICD-10-CM | POA: Diagnosis not present

## 2019-07-18 DIAGNOSIS — Z4822 Encounter for aftercare following kidney transplant: Secondary | ICD-10-CM | POA: Diagnosis not present

## 2019-07-20 DIAGNOSIS — I151 Hypertension secondary to other renal disorders: Secondary | ICD-10-CM | POA: Diagnosis not present

## 2019-07-20 DIAGNOSIS — Z940 Kidney transplant status: Secondary | ICD-10-CM | POA: Diagnosis not present

## 2019-07-20 DIAGNOSIS — T8619 Other complication of kidney transplant: Secondary | ICD-10-CM | POA: Diagnosis not present

## 2019-07-20 DIAGNOSIS — Z4822 Encounter for aftercare following kidney transplant: Secondary | ICD-10-CM | POA: Diagnosis not present

## 2019-07-20 DIAGNOSIS — R7989 Other specified abnormal findings of blood chemistry: Secondary | ICD-10-CM | POA: Diagnosis not present

## 2019-07-20 DIAGNOSIS — B965 Pseudomonas (aeruginosa) (mallei) (pseudomallei) as the cause of diseases classified elsewhere: Secondary | ICD-10-CM | POA: Diagnosis not present

## 2019-07-20 DIAGNOSIS — E118 Type 2 diabetes mellitus with unspecified complications: Secondary | ICD-10-CM | POA: Diagnosis not present

## 2019-07-20 DIAGNOSIS — Z7952 Long term (current) use of systemic steroids: Secondary | ICD-10-CM | POA: Diagnosis not present

## 2019-07-20 DIAGNOSIS — Z79899 Other long term (current) drug therapy: Secondary | ICD-10-CM | POA: Diagnosis not present

## 2019-07-20 DIAGNOSIS — R7881 Bacteremia: Secondary | ICD-10-CM | POA: Diagnosis not present

## 2019-07-20 DIAGNOSIS — Z794 Long term (current) use of insulin: Secondary | ICD-10-CM | POA: Diagnosis not present

## 2019-07-20 DIAGNOSIS — Z4802 Encounter for removal of sutures: Secondary | ICD-10-CM | POA: Diagnosis not present

## 2019-07-21 DIAGNOSIS — E1122 Type 2 diabetes mellitus with diabetic chronic kidney disease: Secondary | ICD-10-CM | POA: Diagnosis not present

## 2019-07-21 DIAGNOSIS — N189 Chronic kidney disease, unspecified: Secondary | ICD-10-CM | POA: Diagnosis not present

## 2019-07-21 DIAGNOSIS — Z4822 Encounter for aftercare following kidney transplant: Secondary | ICD-10-CM | POA: Diagnosis not present

## 2019-07-21 DIAGNOSIS — I129 Hypertensive chronic kidney disease with stage 1 through stage 4 chronic kidney disease, or unspecified chronic kidney disease: Secondary | ICD-10-CM | POA: Diagnosis not present

## 2019-07-21 DIAGNOSIS — N390 Urinary tract infection, site not specified: Secondary | ICD-10-CM | POA: Diagnosis not present

## 2019-07-21 DIAGNOSIS — A4152 Sepsis due to Pseudomonas: Secondary | ICD-10-CM | POA: Diagnosis not present

## 2019-07-21 DIAGNOSIS — E1142 Type 2 diabetes mellitus with diabetic polyneuropathy: Secondary | ICD-10-CM | POA: Diagnosis not present

## 2019-07-21 DIAGNOSIS — Z792 Long term (current) use of antibiotics: Secondary | ICD-10-CM | POA: Diagnosis not present

## 2019-07-21 DIAGNOSIS — Z794 Long term (current) use of insulin: Secondary | ICD-10-CM | POA: Diagnosis not present

## 2019-07-21 DIAGNOSIS — Z452 Encounter for adjustment and management of vascular access device: Secondary | ICD-10-CM | POA: Diagnosis not present

## 2019-07-22 DIAGNOSIS — Z5181 Encounter for therapeutic drug level monitoring: Secondary | ICD-10-CM | POA: Diagnosis not present

## 2019-07-22 DIAGNOSIS — B965 Pseudomonas (aeruginosa) (mallei) (pseudomallei) as the cause of diseases classified elsewhere: Secondary | ICD-10-CM | POA: Diagnosis not present

## 2019-07-22 DIAGNOSIS — E119 Type 2 diabetes mellitus without complications: Secondary | ICD-10-CM | POA: Diagnosis not present

## 2019-07-22 DIAGNOSIS — L7632 Postprocedural hematoma of skin and subcutaneous tissue following other procedure: Secondary | ICD-10-CM | POA: Diagnosis not present

## 2019-07-22 DIAGNOSIS — Z940 Kidney transplant status: Secondary | ICD-10-CM | POA: Diagnosis not present

## 2019-07-22 DIAGNOSIS — E109 Type 1 diabetes mellitus without complications: Secondary | ICD-10-CM | POA: Diagnosis not present

## 2019-07-22 DIAGNOSIS — Z4822 Encounter for aftercare following kidney transplant: Secondary | ICD-10-CM | POA: Diagnosis not present

## 2019-07-22 DIAGNOSIS — Z79899 Other long term (current) drug therapy: Secondary | ICD-10-CM | POA: Diagnosis not present

## 2019-07-22 DIAGNOSIS — Z792 Long term (current) use of antibiotics: Secondary | ICD-10-CM | POA: Diagnosis not present

## 2019-07-22 DIAGNOSIS — R7881 Bacteremia: Secondary | ICD-10-CM | POA: Diagnosis not present

## 2019-07-22 DIAGNOSIS — D8989 Other specified disorders involving the immune mechanism, not elsewhere classified: Secondary | ICD-10-CM | POA: Diagnosis not present

## 2019-07-22 DIAGNOSIS — Z7952 Long term (current) use of systemic steroids: Secondary | ICD-10-CM | POA: Diagnosis not present

## 2019-07-22 DIAGNOSIS — I10 Essential (primary) hypertension: Secondary | ICD-10-CM | POA: Diagnosis not present

## 2019-07-22 DIAGNOSIS — S301XXD Contusion of abdominal wall, subsequent encounter: Secondary | ICD-10-CM | POA: Diagnosis not present

## 2019-07-22 DIAGNOSIS — Z87448 Personal history of other diseases of urinary system: Secondary | ICD-10-CM | POA: Diagnosis not present

## 2019-07-22 DIAGNOSIS — Z87440 Personal history of urinary (tract) infections: Secondary | ICD-10-CM | POA: Diagnosis not present

## 2019-07-24 DIAGNOSIS — R7881 Bacteremia: Secondary | ICD-10-CM | POA: Diagnosis not present

## 2019-07-24 DIAGNOSIS — Z4822 Encounter for aftercare following kidney transplant: Secondary | ICD-10-CM | POA: Diagnosis not present

## 2019-07-24 DIAGNOSIS — E119 Type 2 diabetes mellitus without complications: Secondary | ICD-10-CM | POA: Diagnosis not present

## 2019-07-24 DIAGNOSIS — D8989 Other specified disorders involving the immune mechanism, not elsewhere classified: Secondary | ICD-10-CM | POA: Diagnosis not present

## 2019-07-24 DIAGNOSIS — Z7952 Long term (current) use of systemic steroids: Secondary | ICD-10-CM | POA: Diagnosis not present

## 2019-07-24 DIAGNOSIS — Z940 Kidney transplant status: Secondary | ICD-10-CM | POA: Diagnosis not present

## 2019-07-24 DIAGNOSIS — Z794 Long term (current) use of insulin: Secondary | ICD-10-CM | POA: Diagnosis not present

## 2019-07-24 DIAGNOSIS — E1165 Type 2 diabetes mellitus with hyperglycemia: Secondary | ICD-10-CM | POA: Diagnosis not present

## 2019-07-24 DIAGNOSIS — I10 Essential (primary) hypertension: Secondary | ICD-10-CM | POA: Diagnosis not present

## 2019-07-24 DIAGNOSIS — Z792 Long term (current) use of antibiotics: Secondary | ICD-10-CM | POA: Diagnosis not present

## 2019-07-24 DIAGNOSIS — B965 Pseudomonas (aeruginosa) (mallei) (pseudomallei) as the cause of diseases classified elsewhere: Secondary | ICD-10-CM | POA: Diagnosis not present

## 2019-07-27 DIAGNOSIS — T8189XA Other complications of procedures, not elsewhere classified, initial encounter: Secondary | ICD-10-CM | POA: Diagnosis not present

## 2019-07-27 DIAGNOSIS — R799 Abnormal finding of blood chemistry, unspecified: Secondary | ICD-10-CM | POA: Diagnosis not present

## 2019-07-27 DIAGNOSIS — Z4822 Encounter for aftercare following kidney transplant: Secondary | ICD-10-CM | POA: Diagnosis not present

## 2019-07-27 DIAGNOSIS — Z794 Long term (current) use of insulin: Secondary | ICD-10-CM | POA: Diagnosis not present

## 2019-07-27 DIAGNOSIS — R7881 Bacteremia: Secondary | ICD-10-CM | POA: Diagnosis not present

## 2019-07-27 DIAGNOSIS — N186 End stage renal disease: Secondary | ICD-10-CM | POA: Diagnosis not present

## 2019-07-27 DIAGNOSIS — E1165 Type 2 diabetes mellitus with hyperglycemia: Secondary | ICD-10-CM | POA: Diagnosis not present

## 2019-07-27 DIAGNOSIS — E785 Hyperlipidemia, unspecified: Secondary | ICD-10-CM | POA: Diagnosis not present

## 2019-07-27 DIAGNOSIS — I10 Essential (primary) hypertension: Secondary | ICD-10-CM | POA: Diagnosis not present

## 2019-07-27 DIAGNOSIS — D631 Anemia in chronic kidney disease: Secondary | ICD-10-CM | POA: Diagnosis not present

## 2019-07-27 DIAGNOSIS — Z466 Encounter for fitting and adjustment of urinary device: Secondary | ICD-10-CM | POA: Diagnosis not present

## 2019-07-27 DIAGNOSIS — D649 Anemia, unspecified: Secondary | ICD-10-CM | POA: Diagnosis not present

## 2019-07-27 DIAGNOSIS — L02211 Cutaneous abscess of abdominal wall: Secondary | ICD-10-CM | POA: Diagnosis not present

## 2019-07-27 DIAGNOSIS — B338 Other specified viral diseases: Secondary | ICD-10-CM | POA: Diagnosis not present

## 2019-07-27 DIAGNOSIS — I120 Hypertensive chronic kidney disease with stage 5 chronic kidney disease or end stage renal disease: Secondary | ICD-10-CM | POA: Diagnosis not present

## 2019-07-27 DIAGNOSIS — R319 Hematuria, unspecified: Secondary | ICD-10-CM | POA: Diagnosis not present

## 2019-07-27 DIAGNOSIS — Z992 Dependence on renal dialysis: Secondary | ICD-10-CM | POA: Diagnosis not present

## 2019-07-27 DIAGNOSIS — E869 Volume depletion, unspecified: Secondary | ICD-10-CM | POA: Diagnosis not present

## 2019-07-27 DIAGNOSIS — B348 Other viral infections of unspecified site: Secondary | ICD-10-CM | POA: Diagnosis not present

## 2019-07-27 DIAGNOSIS — Z792 Long term (current) use of antibiotics: Secondary | ICD-10-CM | POA: Diagnosis not present

## 2019-07-27 DIAGNOSIS — Z940 Kidney transplant status: Secondary | ICD-10-CM | POA: Diagnosis not present

## 2019-07-27 DIAGNOSIS — Z7952 Long term (current) use of systemic steroids: Secondary | ICD-10-CM | POA: Diagnosis not present

## 2019-07-27 DIAGNOSIS — R82998 Other abnormal findings in urine: Secondary | ICD-10-CM | POA: Diagnosis not present

## 2019-07-27 DIAGNOSIS — E1022 Type 1 diabetes mellitus with diabetic chronic kidney disease: Secondary | ICD-10-CM | POA: Diagnosis not present

## 2019-07-27 DIAGNOSIS — Z79899 Other long term (current) drug therapy: Secondary | ICD-10-CM | POA: Diagnosis not present

## 2019-07-27 DIAGNOSIS — B965 Pseudomonas (aeruginosa) (mallei) (pseudomallei) as the cause of diseases classified elsewhere: Secondary | ICD-10-CM | POA: Diagnosis not present

## 2019-07-28 DIAGNOSIS — Z5181 Encounter for therapeutic drug level monitoring: Secondary | ICD-10-CM | POA: Diagnosis not present

## 2019-07-28 DIAGNOSIS — T8619 Other complication of kidney transplant: Secondary | ICD-10-CM | POA: Diagnosis not present

## 2019-07-28 DIAGNOSIS — Z4822 Encounter for aftercare following kidney transplant: Secondary | ICD-10-CM | POA: Diagnosis not present

## 2019-07-28 DIAGNOSIS — Z940 Kidney transplant status: Secondary | ICD-10-CM | POA: Diagnosis not present

## 2019-07-28 DIAGNOSIS — Z79899 Other long term (current) drug therapy: Secondary | ICD-10-CM | POA: Diagnosis not present

## 2019-07-30 DIAGNOSIS — Z79899 Other long term (current) drug therapy: Secondary | ICD-10-CM | POA: Diagnosis not present

## 2019-07-30 DIAGNOSIS — B349 Viral infection, unspecified: Secondary | ICD-10-CM | POA: Diagnosis not present

## 2019-07-30 DIAGNOSIS — E872 Acidosis: Secondary | ICD-10-CM | POA: Diagnosis not present

## 2019-07-30 DIAGNOSIS — E1136 Type 2 diabetes mellitus with diabetic cataract: Secondary | ICD-10-CM | POA: Diagnosis not present

## 2019-07-30 DIAGNOSIS — Z4822 Encounter for aftercare following kidney transplant: Secondary | ICD-10-CM | POA: Diagnosis not present

## 2019-07-30 DIAGNOSIS — I151 Hypertension secondary to other renal disorders: Secondary | ICD-10-CM | POA: Diagnosis not present

## 2019-07-30 DIAGNOSIS — D849 Immunodeficiency, unspecified: Secondary | ICD-10-CM | POA: Diagnosis not present

## 2019-07-30 DIAGNOSIS — Z794 Long term (current) use of insulin: Secondary | ICD-10-CM | POA: Diagnosis not present

## 2019-07-30 DIAGNOSIS — N2889 Other specified disorders of kidney and ureter: Secondary | ICD-10-CM | POA: Diagnosis not present

## 2019-07-30 DIAGNOSIS — E785 Hyperlipidemia, unspecified: Secondary | ICD-10-CM | POA: Diagnosis not present

## 2019-07-30 DIAGNOSIS — Z7952 Long term (current) use of systemic steroids: Secondary | ICD-10-CM | POA: Diagnosis not present

## 2019-07-30 DIAGNOSIS — N170 Acute kidney failure with tubular necrosis: Secondary | ICD-10-CM | POA: Diagnosis not present

## 2019-07-30 DIAGNOSIS — B965 Pseudomonas (aeruginosa) (mallei) (pseudomallei) as the cause of diseases classified elsewhere: Secondary | ICD-10-CM | POA: Diagnosis not present

## 2019-07-30 DIAGNOSIS — E113599 Type 2 diabetes mellitus with proliferative diabetic retinopathy without macular edema, unspecified eye: Secondary | ICD-10-CM | POA: Diagnosis not present

## 2019-07-30 DIAGNOSIS — Z940 Kidney transplant status: Secondary | ICD-10-CM | POA: Diagnosis not present

## 2019-07-30 DIAGNOSIS — R7881 Bacteremia: Secondary | ICD-10-CM | POA: Diagnosis not present

## 2019-07-30 DIAGNOSIS — L02211 Cutaneous abscess of abdominal wall: Secondary | ICD-10-CM | POA: Diagnosis not present

## 2019-07-30 DIAGNOSIS — I10 Essential (primary) hypertension: Secondary | ICD-10-CM | POA: Diagnosis not present

## 2019-07-30 DIAGNOSIS — D649 Anemia, unspecified: Secondary | ICD-10-CM | POA: Diagnosis not present

## 2019-08-03 DIAGNOSIS — Z79899 Other long term (current) drug therapy: Secondary | ICD-10-CM | POA: Diagnosis not present

## 2019-08-03 DIAGNOSIS — Z452 Encounter for adjustment and management of vascular access device: Secondary | ICD-10-CM | POA: Diagnosis not present

## 2019-08-03 DIAGNOSIS — I10 Essential (primary) hypertension: Secondary | ICD-10-CM | POA: Diagnosis not present

## 2019-08-03 DIAGNOSIS — Z5181 Encounter for therapeutic drug level monitoring: Secondary | ICD-10-CM | POA: Diagnosis not present

## 2019-08-03 DIAGNOSIS — R7881 Bacteremia: Secondary | ICD-10-CM | POA: Diagnosis not present

## 2019-08-03 DIAGNOSIS — E1065 Type 1 diabetes mellitus with hyperglycemia: Secondary | ICD-10-CM | POA: Diagnosis not present

## 2019-08-03 DIAGNOSIS — D649 Anemia, unspecified: Secondary | ICD-10-CM | POA: Diagnosis not present

## 2019-08-03 DIAGNOSIS — E1165 Type 2 diabetes mellitus with hyperglycemia: Secondary | ICD-10-CM | POA: Diagnosis not present

## 2019-08-03 DIAGNOSIS — T8619 Other complication of kidney transplant: Secondary | ICD-10-CM | POA: Diagnosis not present

## 2019-08-03 DIAGNOSIS — Z940 Kidney transplant status: Secondary | ICD-10-CM | POA: Diagnosis not present

## 2019-08-03 DIAGNOSIS — E872 Acidosis: Secondary | ICD-10-CM | POA: Diagnosis not present

## 2019-08-03 DIAGNOSIS — E869 Volume depletion, unspecified: Secondary | ICD-10-CM | POA: Diagnosis not present

## 2019-08-03 DIAGNOSIS — Z794 Long term (current) use of insulin: Secondary | ICD-10-CM | POA: Diagnosis not present

## 2019-08-03 DIAGNOSIS — B349 Viral infection, unspecified: Secondary | ICD-10-CM | POA: Diagnosis not present

## 2019-08-03 DIAGNOSIS — Z7952 Long term (current) use of systemic steroids: Secondary | ICD-10-CM | POA: Diagnosis not present

## 2019-08-03 DIAGNOSIS — E785 Hyperlipidemia, unspecified: Secondary | ICD-10-CM | POA: Diagnosis not present

## 2019-08-03 DIAGNOSIS — Z792 Long term (current) use of antibiotics: Secondary | ICD-10-CM | POA: Diagnosis not present

## 2019-08-05 DIAGNOSIS — Z833 Family history of diabetes mellitus: Secondary | ICD-10-CM | POA: Diagnosis not present

## 2019-08-05 DIAGNOSIS — E119 Type 2 diabetes mellitus without complications: Secondary | ICD-10-CM | POA: Diagnosis not present

## 2019-08-05 DIAGNOSIS — Z8249 Family history of ischemic heart disease and other diseases of the circulatory system: Secondary | ICD-10-CM | POA: Diagnosis not present

## 2019-08-05 DIAGNOSIS — Z794 Long term (current) use of insulin: Secondary | ICD-10-CM | POA: Diagnosis not present

## 2019-08-05 DIAGNOSIS — N151 Renal and perinephric abscess: Secondary | ICD-10-CM | POA: Diagnosis present

## 2019-08-05 DIAGNOSIS — J181 Lobar pneumonia, unspecified organism: Secondary | ICD-10-CM | POA: Diagnosis not present

## 2019-08-05 DIAGNOSIS — I10 Essential (primary) hypertension: Secondary | ICD-10-CM | POA: Diagnosis present

## 2019-08-05 DIAGNOSIS — R918 Other nonspecific abnormal finding of lung field: Secondary | ICD-10-CM | POA: Diagnosis not present

## 2019-08-05 DIAGNOSIS — N179 Acute kidney failure, unspecified: Secondary | ICD-10-CM | POA: Diagnosis present

## 2019-08-05 DIAGNOSIS — J189 Pneumonia, unspecified organism: Secondary | ICD-10-CM | POA: Diagnosis not present

## 2019-08-05 DIAGNOSIS — N4283 Cyst of prostate: Secondary | ICD-10-CM | POA: Diagnosis not present

## 2019-08-05 DIAGNOSIS — E872 Acidosis: Secondary | ICD-10-CM | POA: Diagnosis not present

## 2019-08-05 DIAGNOSIS — Z79899 Other long term (current) drug therapy: Secondary | ICD-10-CM | POA: Diagnosis not present

## 2019-08-05 DIAGNOSIS — R7881 Bacteremia: Secondary | ICD-10-CM | POA: Diagnosis not present

## 2019-08-05 DIAGNOSIS — R0689 Other abnormalities of breathing: Secondary | ICD-10-CM | POA: Diagnosis not present

## 2019-08-05 DIAGNOSIS — I132 Hypertensive heart and chronic kidney disease with heart failure and with stage 5 chronic kidney disease, or end stage renal disease: Secondary | ICD-10-CM | POA: Diagnosis not present

## 2019-08-05 DIAGNOSIS — E785 Hyperlipidemia, unspecified: Secondary | ICD-10-CM | POA: Diagnosis not present

## 2019-08-05 DIAGNOSIS — H5461 Unqualified visual loss, right eye, normal vision left eye: Secondary | ICD-10-CM | POA: Diagnosis present

## 2019-08-05 DIAGNOSIS — E1142 Type 2 diabetes mellitus with diabetic polyneuropathy: Secondary | ICD-10-CM | POA: Diagnosis present

## 2019-08-05 DIAGNOSIS — D8989 Other specified disorders involving the immune mechanism, not elsewhere classified: Secondary | ICD-10-CM | POA: Diagnosis not present

## 2019-08-05 DIAGNOSIS — N412 Abscess of prostate: Secondary | ICD-10-CM | POA: Diagnosis present

## 2019-08-05 DIAGNOSIS — R0602 Shortness of breath: Secondary | ICD-10-CM | POA: Diagnosis not present

## 2019-08-05 DIAGNOSIS — R197 Diarrhea, unspecified: Secondary | ICD-10-CM | POA: Diagnosis not present

## 2019-08-05 DIAGNOSIS — D696 Thrombocytopenia, unspecified: Secondary | ICD-10-CM | POA: Diagnosis not present

## 2019-08-05 DIAGNOSIS — Z5181 Encounter for therapeutic drug level monitoring: Secondary | ICD-10-CM | POA: Diagnosis not present

## 2019-08-05 DIAGNOSIS — N186 End stage renal disease: Secondary | ICD-10-CM | POA: Diagnosis not present

## 2019-08-05 DIAGNOSIS — I5032 Chronic diastolic (congestive) heart failure: Secondary | ICD-10-CM | POA: Diagnosis not present

## 2019-08-05 DIAGNOSIS — T8619 Other complication of kidney transplant: Secondary | ICD-10-CM | POA: Diagnosis present

## 2019-08-05 DIAGNOSIS — R509 Fever, unspecified: Secondary | ICD-10-CM | POA: Diagnosis not present

## 2019-08-05 DIAGNOSIS — I120 Hypertensive chronic kidney disease with stage 5 chronic kidney disease or end stage renal disease: Secondary | ICD-10-CM | POA: Diagnosis not present

## 2019-08-05 DIAGNOSIS — E876 Hypokalemia: Secondary | ICD-10-CM | POA: Diagnosis not present

## 2019-08-05 DIAGNOSIS — J9601 Acute respiratory failure with hypoxia: Secondary | ICD-10-CM | POA: Diagnosis not present

## 2019-08-05 DIAGNOSIS — Z4822 Encounter for aftercare following kidney transplant: Secondary | ICD-10-CM | POA: Diagnosis not present

## 2019-08-05 DIAGNOSIS — Z452 Encounter for adjustment and management of vascular access device: Secondary | ICD-10-CM | POA: Diagnosis not present

## 2019-08-05 DIAGNOSIS — R188 Other ascites: Secondary | ICD-10-CM | POA: Diagnosis not present

## 2019-08-05 DIAGNOSIS — Z87891 Personal history of nicotine dependence: Secondary | ICD-10-CM | POA: Diagnosis not present

## 2019-08-05 DIAGNOSIS — Z7952 Long term (current) use of systemic steroids: Secondary | ICD-10-CM | POA: Diagnosis not present

## 2019-08-05 DIAGNOSIS — R630 Anorexia: Secondary | ICD-10-CM | POA: Diagnosis not present

## 2019-08-05 DIAGNOSIS — Z992 Dependence on renal dialysis: Secondary | ICD-10-CM | POA: Diagnosis not present

## 2019-08-05 DIAGNOSIS — R0682 Tachypnea, not elsewhere classified: Secondary | ICD-10-CM | POA: Diagnosis not present

## 2019-08-05 DIAGNOSIS — R531 Weakness: Secondary | ICD-10-CM | POA: Diagnosis not present

## 2019-08-05 DIAGNOSIS — R0902 Hypoxemia: Secondary | ICD-10-CM | POA: Diagnosis not present

## 2019-08-05 DIAGNOSIS — R5383 Other fatigue: Secondary | ICD-10-CM | POA: Diagnosis not present

## 2019-08-05 DIAGNOSIS — I082 Rheumatic disorders of both aortic and tricuspid valves: Secondary | ICD-10-CM | POA: Diagnosis not present

## 2019-08-05 DIAGNOSIS — Z940 Kidney transplant status: Secondary | ICD-10-CM | POA: Diagnosis not present

## 2019-08-05 DIAGNOSIS — E871 Hypo-osmolality and hyponatremia: Secondary | ICD-10-CM | POA: Diagnosis not present

## 2019-08-05 DIAGNOSIS — Z209 Contact with and (suspected) exposure to unspecified communicable disease: Secondary | ICD-10-CM | POA: Diagnosis not present

## 2019-08-05 DIAGNOSIS — E1122 Type 2 diabetes mellitus with diabetic chronic kidney disease: Secondary | ICD-10-CM | POA: Diagnosis not present

## 2019-08-05 DIAGNOSIS — Z20828 Contact with and (suspected) exposure to other viral communicable diseases: Secondary | ICD-10-CM | POA: Diagnosis not present

## 2019-08-05 DIAGNOSIS — I272 Pulmonary hypertension, unspecified: Secondary | ICD-10-CM | POA: Diagnosis not present

## 2019-08-05 DIAGNOSIS — R6520 Severe sepsis without septic shock: Secondary | ICD-10-CM | POA: Diagnosis not present

## 2019-08-05 DIAGNOSIS — A4152 Sepsis due to Pseudomonas: Secondary | ICD-10-CM | POA: Diagnosis not present

## 2019-08-05 DIAGNOSIS — B965 Pseudomonas (aeruginosa) (mallei) (pseudomallei) as the cause of diseases classified elsewhere: Secondary | ICD-10-CM | POA: Diagnosis not present

## 2019-08-08 ENCOUNTER — Emergency Department (HOSPITAL_COMMUNITY)
Admission: EM | Admit: 2019-08-08 | Discharge: 2019-08-08 | Disposition: A | Discharge status: Other Acute Inpatient Hospital | Payer: Medicare Other | Attending: Emergency Medicine | Admitting: Emergency Medicine

## 2019-08-08 ENCOUNTER — Emergency Department (HOSPITAL_COMMUNITY): Payer: Medicare Other

## 2019-08-08 ENCOUNTER — Other Ambulatory Visit: Payer: Self-pay

## 2019-08-08 ENCOUNTER — Encounter (HOSPITAL_COMMUNITY): Payer: Self-pay | Admitting: Emergency Medicine

## 2019-08-08 DIAGNOSIS — E876 Hypokalemia: Secondary | ICD-10-CM | POA: Diagnosis not present

## 2019-08-08 DIAGNOSIS — R0602 Shortness of breath: Secondary | ICD-10-CM | POA: Diagnosis not present

## 2019-08-08 DIAGNOSIS — Z87891 Personal history of nicotine dependence: Secondary | ICD-10-CM | POA: Diagnosis not present

## 2019-08-08 DIAGNOSIS — Z8249 Family history of ischemic heart disease and other diseases of the circulatory system: Secondary | ICD-10-CM | POA: Diagnosis not present

## 2019-08-08 DIAGNOSIS — E1142 Type 2 diabetes mellitus with diabetic polyneuropathy: Secondary | ICD-10-CM | POA: Diagnosis present

## 2019-08-08 DIAGNOSIS — Z940 Kidney transplant status: Secondary | ICD-10-CM | POA: Insufficient documentation

## 2019-08-08 DIAGNOSIS — Z5181 Encounter for therapeutic drug level monitoring: Secondary | ICD-10-CM | POA: Diagnosis not present

## 2019-08-08 DIAGNOSIS — N179 Acute kidney failure, unspecified: Secondary | ICD-10-CM | POA: Diagnosis present

## 2019-08-08 DIAGNOSIS — J189 Pneumonia, unspecified organism: Secondary | ICD-10-CM | POA: Diagnosis not present

## 2019-08-08 DIAGNOSIS — Z20828 Contact with and (suspected) exposure to other viral communicable diseases: Secondary | ICD-10-CM | POA: Insufficient documentation

## 2019-08-08 DIAGNOSIS — J9601 Acute respiratory failure with hypoxia: Secondary | ICD-10-CM | POA: Diagnosis not present

## 2019-08-08 DIAGNOSIS — T8619 Other complication of kidney transplant: Secondary | ICD-10-CM | POA: Diagnosis present

## 2019-08-08 DIAGNOSIS — R6520 Severe sepsis without septic shock: Secondary | ICD-10-CM | POA: Diagnosis not present

## 2019-08-08 DIAGNOSIS — R7881 Bacteremia: Secondary | ICD-10-CM | POA: Diagnosis not present

## 2019-08-08 DIAGNOSIS — R509 Fever, unspecified: Secondary | ICD-10-CM | POA: Insufficient documentation

## 2019-08-08 DIAGNOSIS — Z992 Dependence on renal dialysis: Secondary | ICD-10-CM | POA: Diagnosis not present

## 2019-08-08 DIAGNOSIS — E1122 Type 2 diabetes mellitus with diabetic chronic kidney disease: Secondary | ICD-10-CM | POA: Insufficient documentation

## 2019-08-08 DIAGNOSIS — I5032 Chronic diastolic (congestive) heart failure: Secondary | ICD-10-CM | POA: Insufficient documentation

## 2019-08-08 DIAGNOSIS — A4152 Sepsis due to Pseudomonas: Secondary | ICD-10-CM | POA: Diagnosis not present

## 2019-08-08 DIAGNOSIS — Z794 Long term (current) use of insulin: Secondary | ICD-10-CM | POA: Diagnosis not present

## 2019-08-08 DIAGNOSIS — Z79899 Other long term (current) drug therapy: Secondary | ICD-10-CM | POA: Diagnosis not present

## 2019-08-08 DIAGNOSIS — I120 Hypertensive chronic kidney disease with stage 5 chronic kidney disease or end stage renal disease: Secondary | ICD-10-CM | POA: Diagnosis not present

## 2019-08-08 DIAGNOSIS — Z4822 Encounter for aftercare following kidney transplant: Secondary | ICD-10-CM | POA: Diagnosis not present

## 2019-08-08 DIAGNOSIS — N186 End stage renal disease: Secondary | ICD-10-CM | POA: Diagnosis not present

## 2019-08-08 DIAGNOSIS — N412 Abscess of prostate: Secondary | ICD-10-CM | POA: Diagnosis present

## 2019-08-08 DIAGNOSIS — Z833 Family history of diabetes mellitus: Secondary | ICD-10-CM | POA: Diagnosis not present

## 2019-08-08 DIAGNOSIS — B965 Pseudomonas (aeruginosa) (mallei) (pseudomallei) as the cause of diseases classified elsewhere: Secondary | ICD-10-CM | POA: Diagnosis not present

## 2019-08-08 DIAGNOSIS — I10 Essential (primary) hypertension: Secondary | ICD-10-CM | POA: Diagnosis present

## 2019-08-08 DIAGNOSIS — I132 Hypertensive heart and chronic kidney disease with heart failure and with stage 5 chronic kidney disease, or end stage renal disease: Secondary | ICD-10-CM | POA: Diagnosis not present

## 2019-08-08 DIAGNOSIS — Z7952 Long term (current) use of systemic steroids: Secondary | ICD-10-CM | POA: Diagnosis not present

## 2019-08-08 DIAGNOSIS — H5461 Unqualified visual loss, right eye, normal vision left eye: Secondary | ICD-10-CM | POA: Diagnosis present

## 2019-08-08 DIAGNOSIS — N151 Renal and perinephric abscess: Secondary | ICD-10-CM | POA: Diagnosis present

## 2019-08-08 DIAGNOSIS — R188 Other ascites: Secondary | ICD-10-CM | POA: Diagnosis not present

## 2019-08-08 LAB — APTT: aPTT: 31 seconds (ref 24–36)

## 2019-08-08 LAB — CBC WITH DIFFERENTIAL/PLATELET
Abs Immature Granulocytes: 0.4 10*3/uL — ABNORMAL HIGH (ref 0.00–0.07)
Basophils Absolute: 0 10*3/uL (ref 0.0–0.1)
Basophils Relative: 0 %
Eosinophils Absolute: 0 10*3/uL (ref 0.0–0.5)
Eosinophils Relative: 0 %
HCT: 28.2 % — ABNORMAL LOW (ref 39.0–52.0)
Hemoglobin: 8.1 g/dL — ABNORMAL LOW (ref 13.0–17.0)
Immature Granulocytes: 2 %
Lymphocytes Relative: 0 %
Lymphs Abs: 0.1 10*3/uL — ABNORMAL LOW (ref 0.7–4.0)
MCH: 24.4 pg — ABNORMAL LOW (ref 26.0–34.0)
MCHC: 28.7 g/dL — ABNORMAL LOW (ref 30.0–36.0)
MCV: 84.9 fL (ref 80.0–100.0)
Monocytes Absolute: 1.4 10*3/uL — ABNORMAL HIGH (ref 0.1–1.0)
Monocytes Relative: 6 %
Neutro Abs: 21.6 10*3/uL — ABNORMAL HIGH (ref 1.7–7.7)
Neutrophils Relative %: 92 %
Platelets: 97 10*3/uL — ABNORMAL LOW (ref 150–400)
RBC: 3.32 MIL/uL — ABNORMAL LOW (ref 4.22–5.81)
RDW: 19.3 % — ABNORMAL HIGH (ref 11.5–15.5)
WBC: 23.5 10*3/uL — ABNORMAL HIGH (ref 4.0–10.5)
nRBC: 0 % (ref 0.0–0.2)

## 2019-08-08 LAB — RESPIRATORY PANEL BY RT PCR (FLU A&B, COVID)
Influenza A by PCR: NEGATIVE
Influenza B by PCR: NEGATIVE
SARS Coronavirus 2 by RT PCR: NEGATIVE

## 2019-08-08 LAB — COMPREHENSIVE METABOLIC PANEL
ALT: 13 U/L (ref 0–44)
AST: 15 U/L (ref 15–41)
Albumin: 3.2 g/dL — ABNORMAL LOW (ref 3.5–5.0)
Alkaline Phosphatase: 127 U/L — ABNORMAL HIGH (ref 38–126)
Anion gap: 10 (ref 5–15)
BUN: 57 mg/dL — ABNORMAL HIGH (ref 6–20)
CO2: 12 mmol/L — ABNORMAL LOW (ref 22–32)
Calcium: 9.1 mg/dL (ref 8.9–10.3)
Chloride: 113 mmol/L — ABNORMAL HIGH (ref 98–111)
Creatinine, Ser: 5.01 mg/dL — ABNORMAL HIGH (ref 0.61–1.24)
GFR calc Af Amer: 14 mL/min — ABNORMAL LOW (ref >60)
GFR calc non Af Amer: 12 mL/min — ABNORMAL LOW (ref >60)
Glucose, Bld: 110 mg/dL — ABNORMAL HIGH (ref 70–99)
Potassium: 4.1 mmol/L (ref 3.5–5.1)
Sodium: 135 mmol/L (ref 135–145)
Total Bilirubin: 0.6 mg/dL (ref 0.3–1.2)
Total Protein: 6.8 g/dL (ref 6.5–8.1)

## 2019-08-08 LAB — LIPASE, BLOOD: Lipase: 28 U/L (ref 11–51)

## 2019-08-08 LAB — PROCALCITONIN: Procalcitonin: 112.6 ng/mL

## 2019-08-08 LAB — LACTIC ACID, PLASMA
Lactic Acid, Venous: 1.4 mmol/L (ref 0.5–1.9)
Lactic Acid, Venous: 1.4 mmol/L (ref 0.5–1.9)

## 2019-08-08 LAB — PROTIME-INR
INR: 1.5 — ABNORMAL HIGH (ref 0.8–1.2)
Prothrombin Time: 18 seconds — ABNORMAL HIGH (ref 11.4–15.2)

## 2019-08-08 MED ORDER — METRONIDAZOLE IN NACL 5-0.79 MG/ML-% IV SOLN
500.0000 mg | Freq: Once | INTRAVENOUS | Status: AC
  Administered 2019-08-08: 500 mg via INTRAVENOUS
  Filled 2019-08-08: qty 100

## 2019-08-08 MED ORDER — VANCOMYCIN HCL IN DEXTROSE 1-5 GM/200ML-% IV SOLN
1000.0000 mg | Freq: Once | INTRAVENOUS | Status: AC
  Administered 2019-08-08: 20:00:00 1000 mg via INTRAVENOUS
  Filled 2019-08-08: qty 200

## 2019-08-08 MED ORDER — SODIUM CHLORIDE 0.9 % IV SOLN
2.0000 g | Freq: Once | INTRAVENOUS | Status: AC
  Administered 2019-08-08: 18:00:00 2 g via INTRAVENOUS
  Filled 2019-08-08: qty 2

## 2019-08-08 MED ORDER — LACTATED RINGERS IV BOLUS
2000.0000 mL | Freq: Once | INTRAVENOUS | Status: AC
  Administered 2019-08-08: 20:00:00 2000 mL via INTRAVENOUS

## 2019-08-08 MED ORDER — SODIUM BICARBONATE 8.4 % IV SOLN
200.0000 meq | Freq: Once | INTRAVENOUS | Status: AC
  Administered 2019-08-08: 20:00:00 200 meq via INTRAVENOUS

## 2019-08-08 MED ORDER — VANCOMYCIN HCL 750 MG/150ML IV SOLN
750.0000 mg | Freq: Once | INTRAVENOUS | Status: DC
  Filled 2019-08-08: qty 150

## 2019-08-08 MED ORDER — VANCOMYCIN HCL IN DEXTROSE 1-5 GM/200ML-% IV SOLN: 1000.0000 mg | INTRAVENOUS | Status: DC

## 2019-08-08 MED ORDER — SODIUM CHLORIDE 0.9 % IV SOLN: 2.0000 g | INTRAVENOUS | Status: DC

## 2019-08-08 MED ORDER — LACTATED RINGERS IV SOLN: INTRAVENOUS | Status: DC

## 2019-08-08 NOTE — ED Provider Notes (Signed)
Emergency Department Provider Note   I have reviewed the triage vital signs and the nursing notes.   HISTORY  Chief Complaint Fever (post transplant)   HPI Alejandro Lee is a 58 y.o. male with PMH of ESRD with kidney transplant at [facility] on 06/27/19 presents to the emergency department for evaluation of shortness of breath with fever.  Patient had similar presentation in the late November.  At that time he was found to have DKA with acute renal failure.  CT showed right external oblique intramuscular hematoma and elevated resistance indices on his transplant lab work.  He did have good blood flow to the graft site.  He was ultimately found to have Pseudomonas in his blood cultures and from a wound drain placed to drain the hematoma.  He was started on cefepime and had a power line placed.  The drains were ultimately removed and patient completed a course of cefepime at home for 14 days.   Patient tells me that over the past 2 days he feels like "I got hit by a truck."  He describes diffuse weakness, myalgias, shortness of breath.  He has had fever along with nausea, vomiting, diarrhea.  He is not experiencing abdominal or back pain.  He denies any chest pain or heart palpitations.  No COVID-19 contacts.  No active antibiotics.  States he continues to make urine.    Past Medical History:  Diagnosis Date  . Anemia    1 blood transfusion  . Arthritis   . Back pain   . CHF (congestive heart failure) ([place])   . Chronic kidney disease    dialysis M/W/F  . Diabetes mellitus without complication ([place])   . DVT (deep venous thrombosis) ([place])   . GERD (gastroesophageal reflux disease)   . Headache    migraines  . History of cardiovascular stress test 11/2017   [facility]) negative dobutamine stress test  . Hyperlipidemia   . Hypertension   . Legally blind    right  . Myocardial infarction [facility])    patient states it was seen on EKG, denies a cardiac cath    Patient Active Problem List   Diagnosis Date Noted  . ESRD on hemodialysis ([place])   . Hyperkalemia 06/25/2018  . Non-compliance with renal dialysis ([place]) 06/25/2018  . Generalized abdominal pain 06/25/2018  . Chronic back pain 06/25/2018  . GERD (gastroesophageal reflux disease) 06/25/2018  . Chronic diastolic HF (heart failure) ([place]) 06/25/2018  . Class 1 obesity due to excess calories in adult 06/25/2018  . Benign essential HTN 06/25/2018  . 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[place]) 12/25/2017  . Type 2 diabetes mellitus with other specified complication ([place]) [id number]  . ESRD (end stage renal disease) on dialysis ([place]) 12/19/2017  . Chronic right-sided low back pain without sciatica 12/19/2017  . Pseudophakia of left eye 09/03/2017  . PDR (proliferative diabetic retinopathy) ([place]) 09/03/2017  . Cataract in degenerative disorder 09/03/2017  . Unspecified complication of internal prosthetic device, implant and graft, initial encounter 05/17/2017  . Rectal abscess 05/17/2017  . 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 Metabolic disorder [id number]  . Hypertension 05/16/2017  . Dependence on renal dialysis ([place]) 05/16/2017  . Anemia of chronic renal failure, stage 5 (HCC) 05/16/2017  . Retinal detachment, tractional, both eyes 06/19/2012    Past Surgical History:  Procedure Laterality Date  . A/V FISTULAGRAM Right 07/22/2018   Procedure: A/V FISTULAGRAM;  Surgeon: Serafina Mitchell, MD;  Location: [facility] INVASIVE CV  LAB;  Service: Cardiovascular;  Laterality: Right;  . AV FISTULA PLACEMENT Right   . AV FISTULA PLACEMENT Right [id number]   Procedure: PLICATION OF RIGHT arm FISTULA;  Surgeon: Waynetta Sandy, MD;  Location: [place];  Service: Vascular;  Laterality: Right;  . BASCILIC VEIN TRANSPOSITION Left 02/05/2019   Procedure: BRACHIOCEPHALIC FISTULA  LEFT ARM;  Surgeon: Serafina Mitchell, MD;  Location: [facility] OR;  Service: Vascular;  Laterality: Left;  . COLONOSCOPY      . diabetic cyst removal Bilateral    on buttocks x8  . EYE SURGERY    . FISTULA SUPERFICIALIZATION Right [id number]   Procedure: PLICATION OF ARTERIOVENOUS FISTULA RIGHT ARM;  Surgeon: Conrad Andover, MD;  Location: [place];  Service: Vascular;  Laterality: Right;  . lens replacement Left   . LIGATION OF ARTERIOVENOUS  FISTULA  03/19/2019   Procedure: LIGATION OF ARTERIOVENOUS  OF COMPETING BRANCHES OF FISTULA LEFT UPPER ARM;  Surgeon: Serafina Mitchell, MD;  Location: [facility] OR;  Service: Vascular;;  . LIGATION OF ARTERIOVENOUS  FISTULA Right 05/07/2019   Procedure: LIGATION OF ARTERIOVENOUS  FISTULA RIGHT ARM;  Surgeon: Serafina Mitchell, MD;  Location: [place];  Service: Vascular;  Laterality: Right;  . NEPHRECTOMY TRANSPLANTED ORGAN    . PERIPHERAL VASCULAR BALLOON ANGIOPLASTY  07/22/2018   Procedure: PERIPHERAL VASCULAR BALLOON ANGIOPLASTY;  Surgeon: Serafina Mitchell, MD;  Location: [place] CV LAB;  Service: Cardiovascular;;  Right [place] fistula   . PERIPHERAL VASCULAR BALLOON ANGIOPLASTY Right 02/03/2019   Procedure: PERIPHERAL VASCULAR BALLOON ANGIOPLASTY;  Surgeon: Serafina Mitchell, MD;  Location: [place] CV LAB;  Service: Cardiovascular;  Laterality: Right;  RUE AVF  . REFRACTIVE SURGERY Bilateral     Allergies Patient has no known allergies.  Family History  Problem Relation Age of Onset  . Diabetes Mother   . Hypertension Mother   . Diabetes Father   . Heart disease Father   . Cancer Father   . Hypertension Father   . Diabetes Sister   . Hypertension Sister   . Diabetes Brother   . Hypertension Brother   . Cancer Brother     Social History Social History   Tobacco Use  . Smoking status: Former Smoker    Types: Cigarettes  . Smokeless tobacco: Never Used  Substance Use Topics  . Alcohol use: Not Currently  . Drug use: Never    Review of Systems  Constitutional: Positive fever/chills and weakness.  Eyes: No visual changes. ENT: No sore throat. Cardiovascular:  Denies chest pain. Respiratory: Positive shortness of breath. Gastrointestinal: No abdominal pain. Positive nausea, vomiting, and diarrhea.  No constipation. Genitourinary: Negative for dysuria. Musculoskeletal: Negative for back pain. Skin: Negative for rash. Neurological: Negative for headaches, focal weakness or numbness.  10-point ROS otherwise negative.  ____________________________________________   PHYSICAL EXAM:  VITAL SIGNS: ED Triage Vitals  Enc Vitals Group     BP 08/08/19 1609 (!) 138/59     Pulse Rate 08/08/19 1609 95     Resp 08/08/19 1609 (!) 40     Temp 08/08/19 1609 (!) 101.4 F (38.6 C)     Temp Source 08/08/19 1609 Oral     SpO2 08/08/19 1609 100 %     Weight 08/08/19 1610 190 lb (86.2 kg)     Height 08/08/19 1610 5\' 7"  (1.702 m)   Constitutional: Alert and oriented. Somewhat ill appearing but able to provide a history.  Eyes: Conjunctivae are normal.  Head: Atraumatic. Nose: No  congestion/rhinnorhea. Mouth/Throat: Mucous membranes are moist.   Neck: No stridor.   Cardiovascular: Mild tachycardia. Good peripheral circulation. Grossly normal heart sounds.   Respiratory: Increased respiratory effort.  No retractions. Lungs CTAB. Gastrointestinal: Soft and nontender. No distention.  Musculoskeletal: No gross deformities of extremities. Neurologic:  Normal speech and language.  Skin:  Skin is warm, dry and intact. No rash noted.   ____________________________________________   LABS (all labs ordered are listed, but only abnormal results are displayed)  Labs Reviewed  COMPREHENSIVE METABOLIC PANEL - Abnormal; Notable for the following components:      Result Value   Chloride 113 (*)    CO2 12 (*)    Glucose, Bld 110 (*)    BUN 57 (*)    Creatinine, Ser 5.01 (*)    Albumin 3.2 (*)    Alkaline Phosphatase 127 (*)    GFR calc non Af Amer 12 (*)    GFR calc Af Amer 14 (*)    All other components within normal limits  CBC WITH DIFFERENTIAL/PLATELET  - Abnormal; Notable for the following components:   WBC 23.5 (*)    RBC 3.32 (*)    Hemoglobin 8.1 (*)    HCT 28.2 (*)    MCH 24.4 (*)    MCHC 28.7 (*)    RDW 19.3 (*)    Platelets 97 (*)    Neutro Abs 21.6 (*)    Lymphs Abs 0.1 (*)    Monocytes Absolute 1.4 (*)    Abs Immature Granulocytes 0.40 (*)    All other components within normal limits  PROTIME-INR - Abnormal; Notable for the following components:   Prothrombin Time 18.0 (*)    INR 1.5 (*)    All other components within normal limits  CULTURE, BLOOD (ROUTINE X 2)  CULTURE, BLOOD (ROUTINE X 2)  RESPIRATORY PANEL BY RT PCR (FLU A&B, COVID)  URINE CULTURE  LACTIC ACID, PLASMA  LACTIC ACID, PLASMA  APTT  LIPASE, BLOOD  PROCALCITONIN  URINALYSIS, ROUTINE W REFLEX MICROSCOPIC  POC SARS CORONAVIRUS 2 AG -  ED   ____________________________________________  EKG   EKG Interpretation  Date/Time:  Saturday August 08 2019 17:35:09 EST Ventricular Rate:  92 PR Interval:    QRS Duration: 87 QT Interval:  371 QTC Calculation: 459 R Axis:   -14 Text Interpretation: Sinus rhythm Probable left atrial enlargement No STEMI Confirmed by Nanda Quinton [phone]) on 08/08/2019 7:36:19 PM       ____________________________________________  RADIOLOGY  DG Chest Port 1 View  Result Date: 08/08/2019 CLINICAL DATA:  Fever and shortness of breath. Kidney transplant on 06/27/2019. EXAM: PORTABLE CHEST 1 VIEW COMPARISON:  Chest x-ray dated 07/12/2019 FINDINGS: The heart size and mediastinal contours are within normal limits. Both lungs are clear. The visualized skeletal structures are unremarkable. IMPRESSION: No active disease. Electronically Signed   By: Lorriane Shire M.D.   On: 08/08/2019 16:42    ____________________________________________   PROCEDURES  Procedure(s) performed:   Procedures  CRITICAL CARE Performed by: Margette Fast Total critical care time: 35 minutes Critical care time was exclusive of separately  billable procedures and treating other patients. Critical care was necessary to treat or prevent imminent or life-threatening deterioration. Critical care was time spent personally by me on the following activities: development of treatment plan with patient and/or surrogate as well as nursing, discussions with consultants, evaluation of patient's response to treatment, examination of patient, obtaining history from patient or surrogate, ordering and performing treatments and interventions, ordering  and review of laboratory studies, ordering and review of radiographic studies, pulse oximetry and re-evaluation of patient's condition.  Nanda Quinton, MD Emergency Medicine  ____________________________________________   INITIAL IMPRESSION / ASSESSMENT AND PLAN / ED COURSE  Pertinent labs & imaging results that were available during my care of the patient were reviewed by me and considered in my medical decision making (see chart for details).   Patient presents to the emergency department for evaluation of fever, chills, vomiting, diarrhea.  Reviewed his discharge summary from [facility].  He completed his course of antibiotics for Pseudomonas bacteremia likely related to drain placed during that admission.  Review of his labs here show significant leukocytosis, fever, tachycardia.  His creatinine is 5.01 and is not in DKA.  Chest x-ray shows no active disease.  Starting broad-spectrum antibiotics as patient is immune compromised and will likely discuss with [facility] once labs are back regarding transfer/admit.   Spoke with Dr. Roxy Horseman at [facility] with transplant service. Reviewed labs and requests 2L LR followed by a rate along with 4 amps Bicarb to be given with low Bicarb here. Continue abx and they will accept in transfer.   10:16 PM  Transport team here for transfer to [facility]. Mental status normal. Vitals stable for transfer.  ____________________________________________  FINAL CLINICAL  IMPRESSION(S) / ED DIAGNOSES  Final diagnoses:  Fever, unspecified fever cause  Kidney transplant recipient     MEDICATIONS GIVEN DURING THIS VISIT:  Medications  vancomycin (VANCOREADY) IVPB 750 mg/150 mL (has no administration in time range)  lactated ringers infusion (has no administration in time range)  ceFEPIme (MAXIPIME) 2 g in sodium chloride 0.9 % 100 mL IVPB (has no administration in time range)  vancomycin (VANCOCIN) IVPB 1000 mg/200 mL premix (has no administration in time range)  ceFEPIme (MAXIPIME) 2 g in sodium chloride 0.9 % 100 mL IVPB (0 g Intravenous Stopped 08/08/19 1828)  metroNIDAZOLE (FLAGYL) IVPB 500 mg (0 mg Intravenous Stopped 08/08/19 1958)  vancomycin (VANCOCIN) IVPB 1000 mg/200 mL premix (0 mg Intravenous Stopped 08/08/19 2214)  lactated ringers bolus 2,000 mL (2,000 mLs Intravenous New Bag/Given 08/08/19 1959)  sodium bicarbonate injection 200 mEq (200 mEq Intravenous Given 08/08/19 2008)    Note:  This document was prepared using Dragon voice recognition software and may include unintentional dictation errors.  Nanda Quinton, MD, [facility] Emergency Medicine    Doyne Ellinger, [facility], MD 08/08/19 2217

## 2019-08-08 NOTE — ED Triage Notes (Signed)
Pt had kidney transplant on 11/14 and has been running a fever and sob x2 days. Pt very dry and somewhat disoriented.

## 2019-08-08 NOTE — ED Notes (Signed)
Pt states he cannot give a urine sample at this time but is aware one is needed

## 2019-08-08 NOTE — Progress Notes (Signed)
Pharmacy Antibiotic Note  Alejandro Lee is a 58 y.o. male admitted on 08/08/2019 with sepsis.  Pharmacy has been consulted for vancomycin and cefepime dosing.  Plan: Vancomycin 1000mg  IV every 48 hours.  Goal trough 15-20 mcg/mL. cefepime 2gm iv q24h  Height: 5\' 7"  (170.2 cm) Weight: 190 lb (86.2 kg) IBW/kg (Calculated) : 66.1  Temp (24hrs), Avg:100.3 F (37.9 C), Min:99.1 F (37.3 C), Max:101.4 F (38.6 C)  Recent Labs  Lab 08/08/19 1623  WBC 23.5*  CREATININE 5.01*  LATICACIDVEN 1.4    Estimated Creatinine Clearance: 17.3 mL/min (A) (by C-G formula based on SCr of 5.01 mg/dL (H)).    No Known Allergies  Antimicrobials this admission: 12/26 cefepime >>  12/26 vancomycin >>   Microbiology results: 12/26 BCx: sent 12/26 UCx: sent   Thank you for allowing pharmacy to be a part of this patient's care.  Donna Christen Vishruth Seoane 08/08/2019 7:02 PM

## 2019-08-09 LAB — BLOOD CULTURE ID PANEL (REFLEXED)

## 2019-08-11 DIAGNOSIS — N151 Renal and perinephric abscess: Secondary | ICD-10-CM | POA: Diagnosis not present

## 2019-08-11 DIAGNOSIS — Z940 Kidney transplant status: Secondary | ICD-10-CM | POA: Diagnosis not present

## 2019-08-11 LAB — CULTURE, BLOOD (ROUTINE X 2)
Special Requests: ADEQUATE
Special Requests: ADEQUATE

## 2019-08-12 ENCOUNTER — Telehealth: Payer: Self-pay

## 2019-08-12 NOTE — Telephone Encounter (Signed)
Post ED Visit - Positive Culture Follow-up  Culture report reviewed by antimicrobial stewardship pharmacist: [place] Team []  Elenor Quinones, Pharm.D. []  Heide Guile, Pharm.D., BCPS AQ-ID []  Parks Neptune, Pharm.D., BCPS []  Alycia Rossetti, Pharm.D., BCPS []  [place], Pharm.D., BCPS, AAHIVP []  Legrand Como, Pharm.D., BCPS, AAHIVP []  Salome Arnt, PharmD, BCPS []  Johnnette Gourd, PharmD, BCPS []  Hughes Better, PharmD, BCPS []  Leeroy Cha, PharmD []  Laqueta Linden, PharmD, BCPS []  Albertina Parr, PharmD [place] Team []  Leodis Sias, PharmD []  Lindell Spar, PharmD []  Royetta Asal, PharmD []  Graylin Shiver, Rph []  Rema Fendt) Glennon Mac, PharmD []  Arlyn Dunning, PharmD []  Netta Cedars, PharmD []  Dia Sitter, PharmD []  Leone Haven, PharmD []  Gretta Arab, PharmD []  Theodis Shove, PharmD []  Peggyann Juba, PharmD []  Reuel Boom, PharmD   Positive blood culture Treated with BactrimDS, organism sensitive to the same and no further patient follow-up is required at this time.  Genia Del 08/12/2019, 12:19 PM

## 2019-08-19 DIAGNOSIS — Z79899 Other long term (current) drug therapy: Secondary | ICD-10-CM | POA: Diagnosis not present

## 2019-08-19 DIAGNOSIS — Z940 Kidney transplant status: Secondary | ICD-10-CM | POA: Diagnosis not present

## 2019-08-19 DIAGNOSIS — E1122 Type 2 diabetes mellitus with diabetic chronic kidney disease: Secondary | ICD-10-CM | POA: Diagnosis not present

## 2019-08-19 DIAGNOSIS — E872 Acidosis: Secondary | ICD-10-CM | POA: Diagnosis not present

## 2019-08-19 DIAGNOSIS — D631 Anemia in chronic kidney disease: Secondary | ICD-10-CM | POA: Diagnosis not present

## 2019-08-19 DIAGNOSIS — B965 Pseudomonas (aeruginosa) (mallei) (pseudomallei) as the cause of diseases classified elsewhere: Secondary | ICD-10-CM | POA: Diagnosis not present

## 2019-08-19 DIAGNOSIS — Z13220 Encounter for screening for lipoid disorders: Secondary | ICD-10-CM | POA: Diagnosis not present

## 2019-08-19 DIAGNOSIS — R7881 Bacteremia: Secondary | ICD-10-CM | POA: Diagnosis not present

## 2019-08-19 DIAGNOSIS — T8619 Other complication of kidney transplant: Secondary | ICD-10-CM | POA: Diagnosis not present

## 2019-08-19 DIAGNOSIS — J188 Other pneumonia, unspecified organism: Secondary | ICD-10-CM | POA: Diagnosis not present

## 2019-08-19 DIAGNOSIS — D8989 Other specified disorders involving the immune mechanism, not elsewhere classified: Secondary | ICD-10-CM | POA: Diagnosis not present

## 2019-08-19 DIAGNOSIS — Z4822 Encounter for aftercare following kidney transplant: Secondary | ICD-10-CM | POA: Diagnosis not present

## 2019-08-19 DIAGNOSIS — N412 Abscess of prostate: Secondary | ICD-10-CM | POA: Diagnosis not present

## 2019-08-19 DIAGNOSIS — N151 Renal and perinephric abscess: Secondary | ICD-10-CM | POA: Diagnosis not present

## 2019-08-19 DIAGNOSIS — Z7952 Long term (current) use of systemic steroids: Secondary | ICD-10-CM | POA: Diagnosis not present

## 2019-08-19 DIAGNOSIS — J181 Lobar pneumonia, unspecified organism: Secondary | ICD-10-CM | POA: Diagnosis not present

## 2019-08-19 DIAGNOSIS — Z992 Dependence on renal dialysis: Secondary | ICD-10-CM | POA: Diagnosis not present

## 2019-08-19 DIAGNOSIS — B348 Other viral infections of unspecified site: Secondary | ICD-10-CM | POA: Diagnosis not present

## 2019-08-19 DIAGNOSIS — I10 Essential (primary) hypertension: Secondary | ICD-10-CM | POA: Diagnosis not present

## 2019-08-19 DIAGNOSIS — B9789 Other viral agents as the cause of diseases classified elsewhere: Secondary | ICD-10-CM | POA: Diagnosis not present

## 2019-08-19 DIAGNOSIS — Z792 Long term (current) use of antibiotics: Secondary | ICD-10-CM | POA: Diagnosis not present

## 2019-08-19 DIAGNOSIS — N186 End stage renal disease: Secondary | ICD-10-CM | POA: Diagnosis not present

## 2019-08-19 DIAGNOSIS — Z794 Long term (current) use of insulin: Secondary | ICD-10-CM | POA: Diagnosis not present

## 2019-08-19 DIAGNOSIS — N179 Acute kidney failure, unspecified: Secondary | ICD-10-CM | POA: Diagnosis not present

## 2019-08-19 DIAGNOSIS — E119 Type 2 diabetes mellitus without complications: Secondary | ICD-10-CM | POA: Diagnosis not present

## 2019-08-19 DIAGNOSIS — I120 Hypertensive chronic kidney disease with stage 5 chronic kidney disease or end stage renal disease: Secondary | ICD-10-CM | POA: Diagnosis not present

## 2019-08-19 DIAGNOSIS — E785 Hyperlipidemia, unspecified: Secondary | ICD-10-CM | POA: Diagnosis not present

## 2019-08-26 DIAGNOSIS — Z79899 Other long term (current) drug therapy: Secondary | ICD-10-CM | POA: Diagnosis not present

## 2019-08-26 DIAGNOSIS — Z4822 Encounter for aftercare following kidney transplant: Secondary | ICD-10-CM | POA: Diagnosis not present

## 2019-08-26 DIAGNOSIS — D649 Anemia, unspecified: Secondary | ICD-10-CM | POA: Diagnosis not present

## 2019-08-26 DIAGNOSIS — B348 Other viral infections of unspecified site: Secondary | ICD-10-CM | POA: Diagnosis not present

## 2019-08-26 DIAGNOSIS — Z794 Long term (current) use of insulin: Secondary | ICD-10-CM | POA: Diagnosis not present

## 2019-08-26 DIAGNOSIS — N412 Abscess of prostate: Secondary | ICD-10-CM | POA: Diagnosis not present

## 2019-08-26 DIAGNOSIS — R7881 Bacteremia: Secondary | ICD-10-CM | POA: Diagnosis not present

## 2019-08-26 DIAGNOSIS — E785 Hyperlipidemia, unspecified: Secondary | ICD-10-CM | POA: Diagnosis not present

## 2019-08-26 DIAGNOSIS — B9789 Other viral agents as the cause of diseases classified elsewhere: Secondary | ICD-10-CM | POA: Diagnosis not present

## 2019-08-26 DIAGNOSIS — B965 Pseudomonas (aeruginosa) (mallei) (pseudomallei) as the cause of diseases classified elsewhere: Secondary | ICD-10-CM | POA: Diagnosis not present

## 2019-08-26 DIAGNOSIS — N151 Renal and perinephric abscess: Secondary | ICD-10-CM | POA: Diagnosis not present

## 2019-08-26 DIAGNOSIS — Z87891 Personal history of nicotine dependence: Secondary | ICD-10-CM | POA: Diagnosis not present

## 2019-08-26 DIAGNOSIS — E119 Type 2 diabetes mellitus without complications: Secondary | ICD-10-CM | POA: Diagnosis not present

## 2019-08-26 DIAGNOSIS — J189 Pneumonia, unspecified organism: Secondary | ICD-10-CM | POA: Diagnosis not present

## 2019-08-26 DIAGNOSIS — D8989 Other specified disorders involving the immune mechanism, not elsewhere classified: Secondary | ICD-10-CM | POA: Diagnosis not present

## 2019-08-26 DIAGNOSIS — I10 Essential (primary) hypertension: Secondary | ICD-10-CM | POA: Diagnosis not present

## 2019-08-26 DIAGNOSIS — Z7952 Long term (current) use of systemic steroids: Secondary | ICD-10-CM | POA: Diagnosis not present

## 2019-08-26 DIAGNOSIS — Z940 Kidney transplant status: Secondary | ICD-10-CM | POA: Diagnosis not present

## 2019-08-26 DIAGNOSIS — E118 Type 2 diabetes mellitus with unspecified complications: Secondary | ICD-10-CM | POA: Diagnosis not present

## 2019-08-26 DIAGNOSIS — Z792 Long term (current) use of antibiotics: Secondary | ICD-10-CM | POA: Diagnosis not present

## 2019-09-03 DIAGNOSIS — B338 Other specified viral diseases: Secondary | ICD-10-CM | POA: Diagnosis not present

## 2019-09-03 DIAGNOSIS — E119 Type 2 diabetes mellitus without complications: Secondary | ICD-10-CM | POA: Diagnosis not present

## 2019-09-03 DIAGNOSIS — E872 Acidosis: Secondary | ICD-10-CM | POA: Diagnosis not present

## 2019-09-03 DIAGNOSIS — D631 Anemia in chronic kidney disease: Secondary | ICD-10-CM | POA: Diagnosis not present

## 2019-09-03 DIAGNOSIS — H547 Unspecified visual loss: Secondary | ICD-10-CM | POA: Diagnosis not present

## 2019-09-03 DIAGNOSIS — R0600 Dyspnea, unspecified: Secondary | ICD-10-CM | POA: Diagnosis not present

## 2019-09-03 DIAGNOSIS — N412 Abscess of prostate: Secondary | ICD-10-CM | POA: Diagnosis not present

## 2019-09-03 DIAGNOSIS — E785 Hyperlipidemia, unspecified: Secondary | ICD-10-CM | POA: Diagnosis not present

## 2019-09-03 DIAGNOSIS — N151 Renal and perinephric abscess: Secondary | ICD-10-CM | POA: Diagnosis not present

## 2019-09-03 DIAGNOSIS — Z20822 Contact with and (suspected) exposure to covid-19: Secondary | ICD-10-CM | POA: Diagnosis not present

## 2019-09-03 DIAGNOSIS — J810 Acute pulmonary edema: Secondary | ICD-10-CM | POA: Diagnosis not present

## 2019-09-03 DIAGNOSIS — Z4822 Encounter for aftercare following kidney transplant: Secondary | ICD-10-CM | POA: Diagnosis not present

## 2019-09-03 DIAGNOSIS — N179 Acute kidney failure, unspecified: Secondary | ICD-10-CM | POA: Diagnosis not present

## 2019-09-03 DIAGNOSIS — B965 Pseudomonas (aeruginosa) (mallei) (pseudomallei) as the cause of diseases classified elsewhere: Secondary | ICD-10-CM | POA: Diagnosis not present

## 2019-09-03 DIAGNOSIS — Z79899 Other long term (current) drug therapy: Secondary | ICD-10-CM | POA: Diagnosis not present

## 2019-09-03 DIAGNOSIS — Z940 Kidney transplant status: Secondary | ICD-10-CM | POA: Diagnosis not present

## 2019-09-03 DIAGNOSIS — Z794 Long term (current) use of insulin: Secondary | ICD-10-CM | POA: Diagnosis not present

## 2019-09-03 DIAGNOSIS — I120 Hypertensive chronic kidney disease with stage 5 chronic kidney disease or end stage renal disease: Secondary | ICD-10-CM | POA: Diagnosis not present

## 2019-09-03 DIAGNOSIS — I517 Cardiomegaly: Secondary | ICD-10-CM | POA: Diagnosis not present

## 2019-09-03 DIAGNOSIS — I10 Essential (primary) hypertension: Secondary | ICD-10-CM | POA: Diagnosis not present

## 2019-09-03 DIAGNOSIS — R7881 Bacteremia: Secondary | ICD-10-CM | POA: Diagnosis not present

## 2019-09-03 DIAGNOSIS — D649 Anemia, unspecified: Secondary | ICD-10-CM | POA: Diagnosis not present

## 2019-09-03 DIAGNOSIS — J129 Viral pneumonia, unspecified: Secondary | ICD-10-CM | POA: Diagnosis not present

## 2019-09-03 DIAGNOSIS — Z7952 Long term (current) use of systemic steroids: Secondary | ICD-10-CM | POA: Diagnosis not present

## 2019-09-03 DIAGNOSIS — R197 Diarrhea, unspecified: Secondary | ICD-10-CM | POA: Diagnosis not present

## 2019-09-03 DIAGNOSIS — N186 End stage renal disease: Secondary | ICD-10-CM | POA: Diagnosis not present

## 2019-09-03 DIAGNOSIS — Z792 Long term (current) use of antibiotics: Secondary | ICD-10-CM | POA: Diagnosis not present

## 2019-09-03 DIAGNOSIS — E1122 Type 2 diabetes mellitus with diabetic chronic kidney disease: Secondary | ICD-10-CM | POA: Diagnosis not present

## 2019-09-07 DIAGNOSIS — Z79899 Other long term (current) drug therapy: Secondary | ICD-10-CM | POA: Diagnosis not present

## 2019-09-07 DIAGNOSIS — Z940 Kidney transplant status: Secondary | ICD-10-CM | POA: Diagnosis not present

## 2019-09-10 DIAGNOSIS — E872 Acidosis: Secondary | ICD-10-CM | POA: Diagnosis not present

## 2019-09-10 DIAGNOSIS — Z7952 Long term (current) use of systemic steroids: Secondary | ICD-10-CM | POA: Diagnosis not present

## 2019-09-10 DIAGNOSIS — R0602 Shortness of breath: Secondary | ICD-10-CM | POA: Diagnosis not present

## 2019-09-10 DIAGNOSIS — N186 End stage renal disease: Secondary | ICD-10-CM | POA: Diagnosis not present

## 2019-09-10 DIAGNOSIS — I151 Hypertension secondary to other renal disorders: Secondary | ICD-10-CM | POA: Diagnosis not present

## 2019-09-10 DIAGNOSIS — Z940 Kidney transplant status: Secondary | ICD-10-CM | POA: Diagnosis not present

## 2019-09-10 DIAGNOSIS — N2889 Other specified disorders of kidney and ureter: Secondary | ICD-10-CM | POA: Diagnosis not present

## 2019-09-10 DIAGNOSIS — Z5181 Encounter for therapeutic drug level monitoring: Secondary | ICD-10-CM | POA: Diagnosis not present

## 2019-09-10 DIAGNOSIS — I272 Pulmonary hypertension, unspecified: Secondary | ICD-10-CM | POA: Diagnosis not present

## 2019-09-10 DIAGNOSIS — Z4822 Encounter for aftercare following kidney transplant: Secondary | ICD-10-CM | POA: Diagnosis not present

## 2019-09-10 DIAGNOSIS — R197 Diarrhea, unspecified: Secondary | ICD-10-CM | POA: Diagnosis not present

## 2019-09-10 DIAGNOSIS — H5461 Unqualified visual loss, right eye, normal vision left eye: Secondary | ICD-10-CM | POA: Diagnosis not present

## 2019-09-10 DIAGNOSIS — Z20822 Contact with and (suspected) exposure to covid-19: Secondary | ICD-10-CM | POA: Diagnosis not present

## 2019-09-10 DIAGNOSIS — Z79899 Other long term (current) drug therapy: Secondary | ICD-10-CM | POA: Diagnosis not present

## 2019-09-10 DIAGNOSIS — B348 Other viral infections of unspecified site: Secondary | ICD-10-CM | POA: Diagnosis not present

## 2019-09-10 DIAGNOSIS — J810 Acute pulmonary edema: Secondary | ICD-10-CM | POA: Diagnosis not present

## 2019-09-10 DIAGNOSIS — B965 Pseudomonas (aeruginosa) (mallei) (pseudomallei) as the cause of diseases classified elsewhere: Secondary | ICD-10-CM | POA: Diagnosis not present

## 2019-09-10 DIAGNOSIS — E875 Hyperkalemia: Secondary | ICD-10-CM | POA: Diagnosis not present

## 2019-09-10 DIAGNOSIS — Z794 Long term (current) use of insulin: Secondary | ICD-10-CM | POA: Diagnosis not present

## 2019-09-10 DIAGNOSIS — R7881 Bacteremia: Secondary | ICD-10-CM | POA: Diagnosis not present

## 2019-09-10 DIAGNOSIS — R0600 Dyspnea, unspecified: Secondary | ICD-10-CM | POA: Diagnosis not present

## 2019-09-11 DIAGNOSIS — R197 Diarrhea, unspecified: Secondary | ICD-10-CM | POA: Diagnosis not present

## 2019-09-11 DIAGNOSIS — R0602 Shortness of breath: Secondary | ICD-10-CM | POA: Diagnosis not present

## 2019-09-11 DIAGNOSIS — Z4822 Encounter for aftercare following kidney transplant: Secondary | ICD-10-CM | POA: Diagnosis not present

## 2019-09-11 DIAGNOSIS — Z79899 Other long term (current) drug therapy: Secondary | ICD-10-CM | POA: Diagnosis not present

## 2019-09-11 DIAGNOSIS — N186 End stage renal disease: Secondary | ICD-10-CM | POA: Diagnosis not present

## 2019-09-11 DIAGNOSIS — R0600 Dyspnea, unspecified: Secondary | ICD-10-CM | POA: Diagnosis not present

## 2019-09-11 DIAGNOSIS — Z940 Kidney transplant status: Secondary | ICD-10-CM | POA: Diagnosis not present

## 2019-09-11 DIAGNOSIS — I272 Pulmonary hypertension, unspecified: Secondary | ICD-10-CM | POA: Diagnosis not present

## 2019-09-11 DIAGNOSIS — E872 Acidosis: Secondary | ICD-10-CM | POA: Diagnosis not present

## 2019-09-11 DIAGNOSIS — Z5181 Encounter for therapeutic drug level monitoring: Secondary | ICD-10-CM | POA: Diagnosis not present

## 2019-09-11 DIAGNOSIS — H5461 Unqualified visual loss, right eye, normal vision left eye: Secondary | ICD-10-CM | POA: Diagnosis not present

## 2019-09-11 DIAGNOSIS — Z794 Long term (current) use of insulin: Secondary | ICD-10-CM | POA: Diagnosis not present

## 2019-09-13 DIAGNOSIS — R9431 Abnormal electrocardiogram [ECG] [EKG]: Secondary | ICD-10-CM | POA: Diagnosis not present

## 2019-09-22 DIAGNOSIS — B349 Viral infection, unspecified: Secondary | ICD-10-CM | POA: Diagnosis not present

## 2019-09-22 DIAGNOSIS — R0600 Dyspnea, unspecified: Secondary | ICD-10-CM | POA: Diagnosis not present

## 2019-09-22 DIAGNOSIS — E119 Type 2 diabetes mellitus without complications: Secondary | ICD-10-CM | POA: Diagnosis not present

## 2019-09-22 DIAGNOSIS — N412 Abscess of prostate: Secondary | ICD-10-CM | POA: Diagnosis not present

## 2019-09-22 DIAGNOSIS — Z792 Long term (current) use of antibiotics: Secondary | ICD-10-CM | POA: Diagnosis not present

## 2019-09-22 DIAGNOSIS — R251 Tremor, unspecified: Secondary | ICD-10-CM | POA: Diagnosis not present

## 2019-09-22 DIAGNOSIS — D8989 Other specified disorders involving the immune mechanism, not elsewhere classified: Secondary | ICD-10-CM | POA: Diagnosis not present

## 2019-09-22 DIAGNOSIS — T8143XD Infection following a procedure, organ and space surgical site, subsequent encounter: Secondary | ICD-10-CM | POA: Diagnosis not present

## 2019-09-22 DIAGNOSIS — Z794 Long term (current) use of insulin: Secondary | ICD-10-CM | POA: Diagnosis not present

## 2019-09-22 DIAGNOSIS — R7881 Bacteremia: Secondary | ICD-10-CM | POA: Diagnosis not present

## 2019-09-22 DIAGNOSIS — J151 Pneumonia due to Pseudomonas: Secondary | ICD-10-CM | POA: Diagnosis not present

## 2019-09-22 DIAGNOSIS — D72819 Decreased white blood cell count, unspecified: Secondary | ICD-10-CM | POA: Diagnosis not present

## 2019-09-22 DIAGNOSIS — E1165 Type 2 diabetes mellitus with hyperglycemia: Secondary | ICD-10-CM | POA: Diagnosis not present

## 2019-09-22 DIAGNOSIS — Z09 Encounter for follow-up examination after completed treatment for conditions other than malignant neoplasm: Secondary | ICD-10-CM | POA: Diagnosis not present

## 2019-09-22 DIAGNOSIS — I120 Hypertensive chronic kidney disease with stage 5 chronic kidney disease or end stage renal disease: Secondary | ICD-10-CM | POA: Diagnosis not present

## 2019-09-22 DIAGNOSIS — E872 Acidosis: Secondary | ICD-10-CM | POA: Diagnosis not present

## 2019-09-22 DIAGNOSIS — B965 Pseudomonas (aeruginosa) (mallei) (pseudomallei) as the cause of diseases classified elsewhere: Secondary | ICD-10-CM | POA: Diagnosis not present

## 2019-09-22 DIAGNOSIS — I10 Essential (primary) hypertension: Secondary | ICD-10-CM | POA: Diagnosis not present

## 2019-09-22 DIAGNOSIS — E1122 Type 2 diabetes mellitus with diabetic chronic kidney disease: Secondary | ICD-10-CM | POA: Diagnosis not present

## 2019-09-22 DIAGNOSIS — Z4822 Encounter for aftercare following kidney transplant: Secondary | ICD-10-CM | POA: Diagnosis not present

## 2019-09-22 DIAGNOSIS — T8619 Other complication of kidney transplant: Secondary | ICD-10-CM | POA: Diagnosis not present

## 2019-09-22 DIAGNOSIS — Z7952 Long term (current) use of systemic steroids: Secondary | ICD-10-CM | POA: Diagnosis not present

## 2019-09-22 DIAGNOSIS — N151 Renal and perinephric abscess: Secondary | ICD-10-CM | POA: Diagnosis not present

## 2019-09-22 DIAGNOSIS — D649 Anemia, unspecified: Secondary | ICD-10-CM | POA: Diagnosis not present

## 2019-09-22 DIAGNOSIS — D72810 Lymphocytopenia: Secondary | ICD-10-CM | POA: Diagnosis not present

## 2019-09-22 DIAGNOSIS — Z940 Kidney transplant status: Secondary | ICD-10-CM | POA: Diagnosis not present

## 2019-09-22 DIAGNOSIS — R194 Change in bowel habit: Secondary | ICD-10-CM | POA: Diagnosis not present

## 2019-09-22 DIAGNOSIS — E785 Hyperlipidemia, unspecified: Secondary | ICD-10-CM | POA: Diagnosis not present

## 2019-09-22 DIAGNOSIS — N186 End stage renal disease: Secondary | ICD-10-CM | POA: Diagnosis not present

## 2019-09-22 DIAGNOSIS — D631 Anemia in chronic kidney disease: Secondary | ICD-10-CM | POA: Diagnosis not present

## 2019-09-24 DIAGNOSIS — Z79899 Other long term (current) drug therapy: Secondary | ICD-10-CM | POA: Diagnosis not present

## 2019-09-24 DIAGNOSIS — Z940 Kidney transplant status: Secondary | ICD-10-CM | POA: Diagnosis not present

## 2019-10-05 DIAGNOSIS — B965 Pseudomonas (aeruginosa) (mallei) (pseudomallei) as the cause of diseases classified elsewhere: Secondary | ICD-10-CM | POA: Diagnosis not present

## 2019-10-05 DIAGNOSIS — Z792 Long term (current) use of antibiotics: Secondary | ICD-10-CM | POA: Diagnosis not present

## 2019-10-05 DIAGNOSIS — E872 Acidosis: Secondary | ICD-10-CM | POA: Diagnosis not present

## 2019-10-05 DIAGNOSIS — Z7952 Long term (current) use of systemic steroids: Secondary | ICD-10-CM | POA: Diagnosis not present

## 2019-10-05 DIAGNOSIS — D649 Anemia, unspecified: Secondary | ICD-10-CM | POA: Diagnosis not present

## 2019-10-05 DIAGNOSIS — R197 Diarrhea, unspecified: Secondary | ICD-10-CM | POA: Diagnosis not present

## 2019-10-05 DIAGNOSIS — B348 Other viral infections of unspecified site: Secondary | ICD-10-CM | POA: Diagnosis not present

## 2019-10-05 DIAGNOSIS — R0600 Dyspnea, unspecified: Secondary | ICD-10-CM | POA: Diagnosis not present

## 2019-10-05 DIAGNOSIS — R7881 Bacteremia: Secondary | ICD-10-CM | POA: Diagnosis not present

## 2019-10-05 DIAGNOSIS — Z4822 Encounter for aftercare following kidney transplant: Secondary | ICD-10-CM | POA: Diagnosis not present

## 2019-10-05 DIAGNOSIS — Z940 Kidney transplant status: Secondary | ICD-10-CM | POA: Diagnosis not present

## 2019-10-05 DIAGNOSIS — E119 Type 2 diabetes mellitus without complications: Secondary | ICD-10-CM | POA: Diagnosis not present

## 2019-10-05 DIAGNOSIS — Z79899 Other long term (current) drug therapy: Secondary | ICD-10-CM | POA: Diagnosis not present

## 2019-10-05 DIAGNOSIS — E785 Hyperlipidemia, unspecified: Secondary | ICD-10-CM | POA: Diagnosis not present

## 2019-10-05 DIAGNOSIS — D8989 Other specified disorders involving the immune mechanism, not elsewhere classified: Secondary | ICD-10-CM | POA: Diagnosis not present

## 2019-10-05 DIAGNOSIS — N151 Renal and perinephric abscess: Secondary | ICD-10-CM | POA: Diagnosis not present

## 2019-10-05 DIAGNOSIS — N412 Abscess of prostate: Secondary | ICD-10-CM | POA: Diagnosis not present

## 2019-10-05 DIAGNOSIS — I10 Essential (primary) hypertension: Secondary | ICD-10-CM | POA: Diagnosis not present

## 2019-10-05 DIAGNOSIS — Z8701 Personal history of pneumonia (recurrent): Secondary | ICD-10-CM | POA: Diagnosis not present

## 2019-10-19 DIAGNOSIS — N412 Abscess of prostate: Secondary | ICD-10-CM | POA: Diagnosis not present

## 2019-10-19 DIAGNOSIS — Z4822 Encounter for aftercare following kidney transplant: Secondary | ICD-10-CM | POA: Diagnosis not present

## 2019-10-19 DIAGNOSIS — R7881 Bacteremia: Secondary | ICD-10-CM | POA: Diagnosis not present

## 2019-10-19 DIAGNOSIS — Z5181 Encounter for therapeutic drug level monitoring: Secondary | ICD-10-CM | POA: Diagnosis not present

## 2019-10-19 DIAGNOSIS — I151 Hypertension secondary to other renal disorders: Secondary | ICD-10-CM | POA: Diagnosis not present

## 2019-10-19 DIAGNOSIS — D849 Immunodeficiency, unspecified: Secondary | ICD-10-CM | POA: Diagnosis not present

## 2019-10-19 DIAGNOSIS — Z79899 Other long term (current) drug therapy: Secondary | ICD-10-CM | POA: Diagnosis not present

## 2019-10-19 DIAGNOSIS — Z940 Kidney transplant status: Secondary | ICD-10-CM | POA: Diagnosis not present

## 2019-10-19 DIAGNOSIS — D649 Anemia, unspecified: Secondary | ICD-10-CM | POA: Diagnosis not present

## 2019-10-19 DIAGNOSIS — B348 Other viral infections of unspecified site: Secondary | ICD-10-CM | POA: Diagnosis not present

## 2019-10-19 DIAGNOSIS — B965 Pseudomonas (aeruginosa) (mallei) (pseudomallei) as the cause of diseases classified elsewhere: Secondary | ICD-10-CM | POA: Diagnosis not present

## 2019-10-19 DIAGNOSIS — N2889 Other specified disorders of kidney and ureter: Secondary | ICD-10-CM | POA: Diagnosis not present

## 2019-10-19 DIAGNOSIS — Z794 Long term (current) use of insulin: Secondary | ICD-10-CM | POA: Diagnosis not present

## 2019-10-19 DIAGNOSIS — I10 Essential (primary) hypertension: Secondary | ICD-10-CM | POA: Diagnosis not present

## 2019-10-19 DIAGNOSIS — Z992 Dependence on renal dialysis: Secondary | ICD-10-CM | POA: Diagnosis not present

## 2019-10-19 DIAGNOSIS — E1165 Type 2 diabetes mellitus with hyperglycemia: Secondary | ICD-10-CM | POA: Diagnosis not present

## 2019-10-19 DIAGNOSIS — E119 Type 2 diabetes mellitus without complications: Secondary | ICD-10-CM | POA: Diagnosis not present

## 2019-10-19 DIAGNOSIS — T8619 Other complication of kidney transplant: Secondary | ICD-10-CM | POA: Diagnosis not present

## 2019-11-15 IMAGING — CR DG CHEST 1V PORT
1 series · 1 of 1 positions shown · non-contrast
Comparison: 03/18/2019

CLINICAL DATA: Chest pain

EXAM:
PORTABLE CHEST 1 VIEW

[portable]
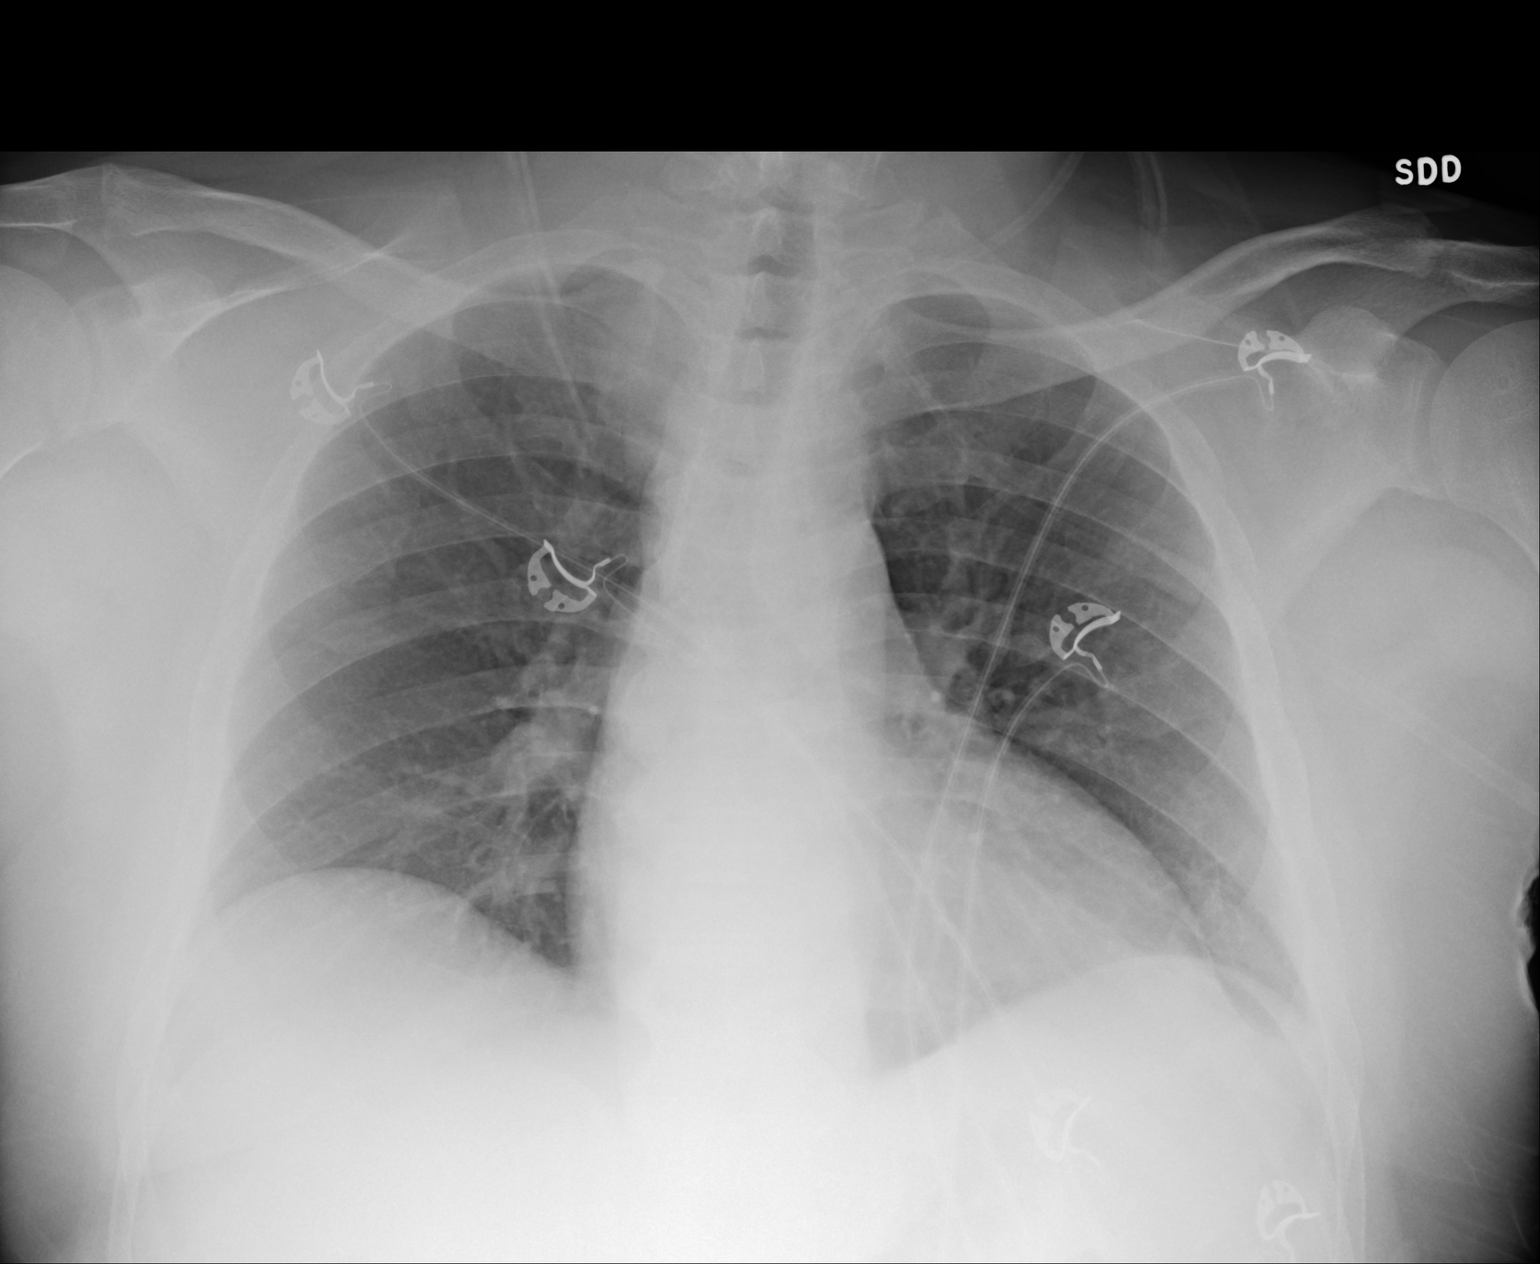

[1 of 1 positions shown; findings below may reference images not displayed]

FINDINGS: Heart and mediastinal contours are within normal limits. No focal
opacities or effusions. No acute bony abnormality.
IMPRESSION: No active disease.

## 2019-11-16 DIAGNOSIS — N151 Renal and perinephric abscess: Secondary | ICD-10-CM | POA: Diagnosis not present

## 2019-11-16 DIAGNOSIS — Z79899 Other long term (current) drug therapy: Secondary | ICD-10-CM | POA: Diagnosis not present

## 2019-11-16 DIAGNOSIS — Z5181 Encounter for therapeutic drug level monitoring: Secondary | ICD-10-CM | POA: Diagnosis not present

## 2019-11-16 DIAGNOSIS — I151 Hypertension secondary to other renal disorders: Secondary | ICD-10-CM | POA: Diagnosis not present

## 2019-11-16 DIAGNOSIS — R7881 Bacteremia: Secondary | ICD-10-CM | POA: Diagnosis not present

## 2019-11-16 DIAGNOSIS — Z992 Dependence on renal dialysis: Secondary | ICD-10-CM | POA: Diagnosis not present

## 2019-11-16 DIAGNOSIS — B348 Other viral infections of unspecified site: Secondary | ICD-10-CM | POA: Diagnosis not present

## 2019-11-16 DIAGNOSIS — B965 Pseudomonas (aeruginosa) (mallei) (pseudomallei) as the cause of diseases classified elsewhere: Secondary | ICD-10-CM | POA: Diagnosis not present

## 2019-11-16 DIAGNOSIS — Z940 Kidney transplant status: Secondary | ICD-10-CM | POA: Diagnosis not present

## 2019-11-16 DIAGNOSIS — N412 Abscess of prostate: Secondary | ICD-10-CM | POA: Diagnosis not present

## 2019-11-16 DIAGNOSIS — B338 Other specified viral diseases: Secondary | ICD-10-CM | POA: Diagnosis not present

## 2019-11-16 DIAGNOSIS — Z4822 Encounter for aftercare following kidney transplant: Secondary | ICD-10-CM | POA: Diagnosis not present

## 2019-11-16 DIAGNOSIS — R0600 Dyspnea, unspecified: Secondary | ICD-10-CM | POA: Diagnosis not present

## 2019-11-16 DIAGNOSIS — D649 Anemia, unspecified: Secondary | ICD-10-CM | POA: Diagnosis not present

## 2019-11-16 DIAGNOSIS — D849 Immunodeficiency, unspecified: Secondary | ICD-10-CM | POA: Diagnosis not present

## 2019-11-16 DIAGNOSIS — Z7952 Long term (current) use of systemic steroids: Secondary | ICD-10-CM | POA: Diagnosis not present

## 2019-11-16 DIAGNOSIS — Z794 Long term (current) use of insulin: Secondary | ICD-10-CM | POA: Diagnosis not present

## 2019-11-16 DIAGNOSIS — Z8701 Personal history of pneumonia (recurrent): Secondary | ICD-10-CM | POA: Diagnosis not present

## 2019-11-16 DIAGNOSIS — R197 Diarrhea, unspecified: Secondary | ICD-10-CM | POA: Diagnosis not present

## 2019-11-16 DIAGNOSIS — T8619 Other complication of kidney transplant: Secondary | ICD-10-CM | POA: Diagnosis not present

## 2019-11-16 DIAGNOSIS — I10 Essential (primary) hypertension: Secondary | ICD-10-CM | POA: Diagnosis not present

## 2019-11-16 DIAGNOSIS — Z792 Long term (current) use of antibiotics: Secondary | ICD-10-CM | POA: Diagnosis not present

## 2019-11-16 DIAGNOSIS — Z9889 Other specified postprocedural states: Secondary | ICD-10-CM | POA: Diagnosis not present

## 2019-11-16 DIAGNOSIS — E1122 Type 2 diabetes mellitus with diabetic chronic kidney disease: Secondary | ICD-10-CM | POA: Diagnosis not present

## 2019-11-16 DIAGNOSIS — E872 Acidosis: Secondary | ICD-10-CM | POA: Diagnosis not present

## 2019-11-16 DIAGNOSIS — E1165 Type 2 diabetes mellitus with hyperglycemia: Secondary | ICD-10-CM | POA: Diagnosis not present

## 2019-11-16 DIAGNOSIS — E785 Hyperlipidemia, unspecified: Secondary | ICD-10-CM | POA: Diagnosis not present

## 2019-11-19 DIAGNOSIS — T8619 Other complication of kidney transplant: Secondary | ICD-10-CM | POA: Diagnosis not present

## 2019-11-19 DIAGNOSIS — Z940 Kidney transplant status: Secondary | ICD-10-CM | POA: Diagnosis not present

## 2019-11-19 DIAGNOSIS — Z79899 Other long term (current) drug therapy: Secondary | ICD-10-CM | POA: Diagnosis not present

## 2019-11-19 DIAGNOSIS — Z4822 Encounter for aftercare following kidney transplant: Secondary | ICD-10-CM | POA: Diagnosis not present

## 2019-11-19 DIAGNOSIS — Z5181 Encounter for therapeutic drug level monitoring: Secondary | ICD-10-CM | POA: Diagnosis not present

## 2019-11-24 DIAGNOSIS — T8611 Kidney transplant rejection: Secondary | ICD-10-CM | POA: Diagnosis not present

## 2019-11-24 DIAGNOSIS — E872 Acidosis: Secondary | ICD-10-CM | POA: Diagnosis not present

## 2019-11-24 DIAGNOSIS — Z5181 Encounter for therapeutic drug level monitoring: Secondary | ICD-10-CM | POA: Diagnosis not present

## 2019-11-24 DIAGNOSIS — M6208 Separation of muscle (nontraumatic), other site: Secondary | ICD-10-CM | POA: Diagnosis not present

## 2019-11-24 DIAGNOSIS — E1022 Type 1 diabetes mellitus with diabetic chronic kidney disease: Secondary | ICD-10-CM | POA: Diagnosis not present

## 2019-11-24 DIAGNOSIS — R7989 Other specified abnormal findings of blood chemistry: Secondary | ICD-10-CM | POA: Diagnosis not present

## 2019-11-24 DIAGNOSIS — Z87891 Personal history of nicotine dependence: Secondary | ICD-10-CM | POA: Diagnosis not present

## 2019-11-24 DIAGNOSIS — Z79899 Other long term (current) drug therapy: Secondary | ICD-10-CM | POA: Diagnosis not present

## 2019-11-24 DIAGNOSIS — Z4822 Encounter for aftercare following kidney transplant: Secondary | ICD-10-CM | POA: Diagnosis not present

## 2019-11-24 DIAGNOSIS — I10 Essential (primary) hypertension: Secondary | ICD-10-CM | POA: Diagnosis not present

## 2019-11-24 DIAGNOSIS — Z833 Family history of diabetes mellitus: Secondary | ICD-10-CM | POA: Diagnosis not present

## 2019-11-24 DIAGNOSIS — D631 Anemia in chronic kidney disease: Secondary | ICD-10-CM | POA: Diagnosis not present

## 2019-11-24 DIAGNOSIS — Z961 Presence of intraocular lens: Secondary | ICD-10-CM | POA: Diagnosis not present

## 2019-11-24 DIAGNOSIS — E1065 Type 1 diabetes mellitus with hyperglycemia: Secondary | ICD-10-CM | POA: Diagnosis not present

## 2019-11-24 DIAGNOSIS — I517 Cardiomegaly: Secondary | ICD-10-CM | POA: Diagnosis not present

## 2019-11-24 DIAGNOSIS — R109 Unspecified abdominal pain: Secondary | ICD-10-CM | POA: Diagnosis not present

## 2019-11-24 DIAGNOSIS — Z794 Long term (current) use of insulin: Secondary | ICD-10-CM | POA: Diagnosis not present

## 2019-11-24 DIAGNOSIS — Z940 Kidney transplant status: Secondary | ICD-10-CM | POA: Diagnosis not present

## 2019-11-24 DIAGNOSIS — N179 Acute kidney failure, unspecified: Secondary | ICD-10-CM | POA: Diagnosis not present

## 2019-11-24 DIAGNOSIS — E1121 Type 2 diabetes mellitus with diabetic nephropathy: Secondary | ICD-10-CM | POA: Diagnosis not present

## 2019-11-27 DIAGNOSIS — Z0000 Encounter for general adult medical examination without abnormal findings: Secondary | ICD-10-CM | POA: Diagnosis not present

## 2019-11-27 DIAGNOSIS — Z125 Encounter for screening for malignant neoplasm of prostate: Secondary | ICD-10-CM | POA: Diagnosis not present

## 2019-11-27 DIAGNOSIS — Z79899 Other long term (current) drug therapy: Secondary | ICD-10-CM | POA: Diagnosis not present

## 2019-11-27 DIAGNOSIS — Z1331 Encounter for screening for depression: Secondary | ICD-10-CM | POA: Diagnosis not present

## 2019-11-27 DIAGNOSIS — Z7189 Other specified counseling: Secondary | ICD-10-CM | POA: Diagnosis not present

## 2019-11-27 DIAGNOSIS — I10 Essential (primary) hypertension: Secondary | ICD-10-CM | POA: Diagnosis not present

## 2019-11-27 DIAGNOSIS — Z299 Encounter for prophylactic measures, unspecified: Secondary | ICD-10-CM | POA: Diagnosis not present

## 2019-11-27 DIAGNOSIS — Z1211 Encounter for screening for malignant neoplasm of colon: Secondary | ICD-10-CM | POA: Diagnosis not present

## 2019-11-27 DIAGNOSIS — Z1339 Encounter for screening examination for other mental health and behavioral disorders: Secondary | ICD-10-CM | POA: Diagnosis not present

## 2019-11-27 DIAGNOSIS — E109 Type 1 diabetes mellitus without complications: Secondary | ICD-10-CM | POA: Diagnosis not present

## 2019-11-27 DIAGNOSIS — D849 Immunodeficiency, unspecified: Secondary | ICD-10-CM | POA: Diagnosis not present

## 2019-12-09 DIAGNOSIS — Z4822 Encounter for aftercare following kidney transplant: Secondary | ICD-10-CM | POA: Diagnosis not present

## 2019-12-09 DIAGNOSIS — E1122 Type 2 diabetes mellitus with diabetic chronic kidney disease: Secondary | ICD-10-CM | POA: Diagnosis not present

## 2019-12-09 DIAGNOSIS — I10 Essential (primary) hypertension: Secondary | ICD-10-CM | POA: Diagnosis not present

## 2019-12-09 DIAGNOSIS — B349 Viral infection, unspecified: Secondary | ICD-10-CM | POA: Diagnosis not present

## 2019-12-09 DIAGNOSIS — N412 Abscess of prostate: Secondary | ICD-10-CM | POA: Diagnosis not present

## 2019-12-09 DIAGNOSIS — N186 End stage renal disease: Secondary | ICD-10-CM | POA: Diagnosis not present

## 2019-12-09 DIAGNOSIS — E872 Acidosis: Secondary | ICD-10-CM | POA: Diagnosis not present

## 2019-12-09 DIAGNOSIS — Z992 Dependence on renal dialysis: Secondary | ICD-10-CM | POA: Diagnosis not present

## 2019-12-09 DIAGNOSIS — R197 Diarrhea, unspecified: Secondary | ICD-10-CM | POA: Diagnosis not present

## 2019-12-09 DIAGNOSIS — E785 Hyperlipidemia, unspecified: Secondary | ICD-10-CM | POA: Diagnosis not present

## 2019-12-09 DIAGNOSIS — R7881 Bacteremia: Secondary | ICD-10-CM | POA: Diagnosis not present

## 2019-12-09 DIAGNOSIS — Z794 Long term (current) use of insulin: Secondary | ICD-10-CM | POA: Diagnosis not present

## 2019-12-09 DIAGNOSIS — D849 Immunodeficiency, unspecified: Secondary | ICD-10-CM | POA: Diagnosis not present

## 2019-12-09 DIAGNOSIS — Z79899 Other long term (current) drug therapy: Secondary | ICD-10-CM | POA: Diagnosis not present

## 2019-12-09 DIAGNOSIS — Z940 Kidney transplant status: Secondary | ICD-10-CM | POA: Diagnosis not present

## 2019-12-09 DIAGNOSIS — I120 Hypertensive chronic kidney disease with stage 5 chronic kidney disease or end stage renal disease: Secondary | ICD-10-CM | POA: Diagnosis not present

## 2019-12-09 DIAGNOSIS — D649 Anemia, unspecified: Secondary | ICD-10-CM | POA: Diagnosis not present

## 2019-12-09 DIAGNOSIS — J189 Pneumonia, unspecified organism: Secondary | ICD-10-CM | POA: Diagnosis not present

## 2019-12-09 DIAGNOSIS — E1121 Type 2 diabetes mellitus with diabetic nephropathy: Secondary | ICD-10-CM | POA: Diagnosis not present

## 2019-12-10 DIAGNOSIS — B965 Pseudomonas (aeruginosa) (mallei) (pseudomallei) as the cause of diseases classified elsewhere: Secondary | ICD-10-CM | POA: Diagnosis not present

## 2019-12-10 DIAGNOSIS — A498 Other bacterial infections of unspecified site: Secondary | ICD-10-CM | POA: Diagnosis not present

## 2019-12-10 DIAGNOSIS — N412 Abscess of prostate: Secondary | ICD-10-CM | POA: Diagnosis not present

## 2019-12-10 DIAGNOSIS — Z792 Long term (current) use of antibiotics: Secondary | ICD-10-CM | POA: Diagnosis not present

## 2019-12-10 DIAGNOSIS — N151 Renal and perinephric abscess: Secondary | ICD-10-CM | POA: Diagnosis not present

## 2019-12-22 ENCOUNTER — Telehealth: Payer: Self-pay

## 2019-12-22 NOTE — Telephone Encounter (Signed)
Pts wife Mariann Laster called requesting a fax for all appointments attended the past year to be sent to [facility].   Left message for wife to return call to discuss further. Minette Brine, RN

## 2019-12-23 DIAGNOSIS — N184 Chronic kidney disease, stage 4 (severe): Secondary | ICD-10-CM | POA: Diagnosis not present

## 2019-12-23 DIAGNOSIS — D849 Immunodeficiency, unspecified: Secondary | ICD-10-CM | POA: Diagnosis not present

## 2019-12-23 DIAGNOSIS — N142 Nephropathy induced by unspecified drug, medicament or biological substance: Secondary | ICD-10-CM | POA: Diagnosis not present

## 2019-12-23 DIAGNOSIS — Z7952 Long term (current) use of systemic steroids: Secondary | ICD-10-CM | POA: Diagnosis not present

## 2019-12-23 DIAGNOSIS — D631 Anemia in chronic kidney disease: Secondary | ICD-10-CM | POA: Diagnosis not present

## 2019-12-23 DIAGNOSIS — I10 Essential (primary) hypertension: Secondary | ICD-10-CM | POA: Diagnosis not present

## 2019-12-23 DIAGNOSIS — T451X5D Adverse effect of antineoplastic and immunosuppressive drugs, subsequent encounter: Secondary | ICD-10-CM | POA: Diagnosis not present

## 2019-12-23 DIAGNOSIS — D84821 Immunodeficiency due to drugs: Secondary | ICD-10-CM | POA: Diagnosis not present

## 2019-12-23 DIAGNOSIS — E119 Type 2 diabetes mellitus without complications: Secondary | ICD-10-CM | POA: Diagnosis not present

## 2019-12-23 DIAGNOSIS — Z940 Kidney transplant status: Secondary | ICD-10-CM | POA: Diagnosis not present

## 2019-12-23 DIAGNOSIS — E785 Hyperlipidemia, unspecified: Secondary | ICD-10-CM | POA: Diagnosis not present

## 2019-12-23 DIAGNOSIS — I120 Hypertensive chronic kidney disease with stage 5 chronic kidney disease or end stage renal disease: Secondary | ICD-10-CM | POA: Diagnosis not present

## 2019-12-23 DIAGNOSIS — E1122 Type 2 diabetes mellitus with diabetic chronic kidney disease: Secondary | ICD-10-CM | POA: Diagnosis not present

## 2019-12-23 DIAGNOSIS — N412 Abscess of prostate: Secondary | ICD-10-CM | POA: Diagnosis not present

## 2019-12-23 DIAGNOSIS — Z794 Long term (current) use of insulin: Secondary | ICD-10-CM | POA: Diagnosis not present

## 2019-12-23 DIAGNOSIS — Z79899 Other long term (current) drug therapy: Secondary | ICD-10-CM | POA: Diagnosis not present

## 2019-12-24 DIAGNOSIS — Z940 Kidney transplant status: Secondary | ICD-10-CM | POA: Diagnosis not present

## 2020-01-10 DIAGNOSIS — I10 Essential (primary) hypertension: Secondary | ICD-10-CM | POA: Diagnosis not present

## 2020-02-02 DIAGNOSIS — B965 Pseudomonas (aeruginosa) (mallei) (pseudomallei) as the cause of diseases classified elsewhere: Secondary | ICD-10-CM | POA: Diagnosis not present

## 2020-02-02 DIAGNOSIS — R7881 Bacteremia: Secondary | ICD-10-CM | POA: Diagnosis not present

## 2020-02-02 DIAGNOSIS — Z940 Kidney transplant status: Secondary | ICD-10-CM | POA: Diagnosis not present

## 2020-02-02 DIAGNOSIS — E872 Acidosis: Secondary | ICD-10-CM | POA: Diagnosis not present

## 2020-02-02 DIAGNOSIS — D849 Immunodeficiency, unspecified: Secondary | ICD-10-CM | POA: Diagnosis not present

## 2020-02-02 DIAGNOSIS — Z4822 Encounter for aftercare following kidney transplant: Secondary | ICD-10-CM | POA: Diagnosis not present

## 2020-02-02 DIAGNOSIS — D649 Anemia, unspecified: Secondary | ICD-10-CM | POA: Diagnosis not present

## 2020-02-02 DIAGNOSIS — I10 Essential (primary) hypertension: Secondary | ICD-10-CM | POA: Diagnosis not present

## 2020-02-02 DIAGNOSIS — B348 Other viral infections of unspecified site: Secondary | ICD-10-CM | POA: Diagnosis not present

## 2020-02-02 DIAGNOSIS — E1165 Type 2 diabetes mellitus with hyperglycemia: Secondary | ICD-10-CM | POA: Diagnosis not present

## 2020-02-02 DIAGNOSIS — Z5181 Encounter for therapeutic drug level monitoring: Secondary | ICD-10-CM | POA: Diagnosis not present

## 2020-02-02 DIAGNOSIS — E119 Type 2 diabetes mellitus without complications: Secondary | ICD-10-CM | POA: Diagnosis not present

## 2020-02-02 DIAGNOSIS — E785 Hyperlipidemia, unspecified: Secondary | ICD-10-CM | POA: Diagnosis not present

## 2020-02-02 DIAGNOSIS — Z79899 Other long term (current) drug therapy: Secondary | ICD-10-CM | POA: Diagnosis not present

## 2020-02-02 DIAGNOSIS — T8611 Kidney transplant rejection: Secondary | ICD-10-CM | POA: Diagnosis not present

## 2020-02-02 DIAGNOSIS — Z794 Long term (current) use of insulin: Secondary | ICD-10-CM | POA: Diagnosis not present

## 2020-02-02 DIAGNOSIS — N412 Abscess of prostate: Secondary | ICD-10-CM | POA: Diagnosis not present

## 2020-02-10 DIAGNOSIS — Z79899 Other long term (current) drug therapy: Secondary | ICD-10-CM | POA: Diagnosis not present

## 2020-02-10 DIAGNOSIS — I10 Essential (primary) hypertension: Secondary | ICD-10-CM | POA: Diagnosis not present

## 2020-02-10 DIAGNOSIS — Z940 Kidney transplant status: Secondary | ICD-10-CM | POA: Diagnosis not present

## 2020-03-01 ENCOUNTER — Encounter (HOSPITAL_COMMUNITY): Payer: Self-pay

## 2020-03-01 ENCOUNTER — Emergency Department (HOSPITAL_COMMUNITY)
Admission: EM | Admit: 2020-03-01 | Discharge: 2020-03-01 | Disposition: A | Discharge status: Other Acute Inpatient Hospital | Payer: Medicare Other | Attending: Emergency Medicine | Admitting: Emergency Medicine

## 2020-03-01 DIAGNOSIS — N189 Chronic kidney disease, unspecified: Secondary | ICD-10-CM

## 2020-03-01 DIAGNOSIS — I2510 Atherosclerotic heart disease of native coronary artery without angina pectoris: Secondary | ICD-10-CM | POA: Diagnosis not present

## 2020-03-01 DIAGNOSIS — E1065 Type 1 diabetes mellitus with hyperglycemia: Secondary | ICD-10-CM | POA: Diagnosis not present

## 2020-03-01 DIAGNOSIS — Z792 Long term (current) use of antibiotics: Secondary | ICD-10-CM | POA: Diagnosis not present

## 2020-03-01 DIAGNOSIS — I082 Rheumatic disorders of both aortic and tricuspid valves: Secondary | ICD-10-CM | POA: Diagnosis not present

## 2020-03-01 DIAGNOSIS — I132 Hypertensive heart and chronic kidney disease with heart failure and with stage 5 chronic kidney disease, or end stage renal disease: Secondary | ICD-10-CM | POA: Insufficient documentation

## 2020-03-01 DIAGNOSIS — I509 Heart failure, unspecified: Secondary | ICD-10-CM | POA: Insufficient documentation

## 2020-03-01 DIAGNOSIS — E1039 Type 1 diabetes mellitus with other diabetic ophthalmic complication: Secondary | ICD-10-CM | POA: Diagnosis not present

## 2020-03-01 DIAGNOSIS — B965 Pseudomonas (aeruginosa) (mallei) (pseudomallei) as the cause of diseases classified elsewhere: Secondary | ICD-10-CM | POA: Diagnosis not present

## 2020-03-01 DIAGNOSIS — E1022 Type 1 diabetes mellitus with diabetic chronic kidney disease: Secondary | ICD-10-CM | POA: Diagnosis not present

## 2020-03-01 DIAGNOSIS — N411 Chronic prostatitis: Secondary | ICD-10-CM | POA: Diagnosis not present

## 2020-03-01 DIAGNOSIS — K469 Unspecified abdominal hernia without obstruction or gangrene: Secondary | ICD-10-CM | POA: Diagnosis not present

## 2020-03-01 DIAGNOSIS — Z7952 Long term (current) use of systemic steroids: Secondary | ICD-10-CM | POA: Diagnosis not present

## 2020-03-01 DIAGNOSIS — Z8249 Family history of ischemic heart disease and other diseases of the circulatory system: Secondary | ICD-10-CM | POA: Diagnosis not present

## 2020-03-01 DIAGNOSIS — R112 Nausea with vomiting, unspecified: Secondary | ICD-10-CM | POA: Diagnosis not present

## 2020-03-01 DIAGNOSIS — R7989 Other specified abnormal findings of blood chemistry: Secondary | ICD-10-CM | POA: Diagnosis not present

## 2020-03-01 DIAGNOSIS — D631 Anemia in chronic kidney disease: Secondary | ICD-10-CM | POA: Diagnosis not present

## 2020-03-01 DIAGNOSIS — T8611 Kidney transplant rejection: Secondary | ICD-10-CM | POA: Diagnosis not present

## 2020-03-01 DIAGNOSIS — R799 Abnormal finding of blood chemistry, unspecified: Secondary | ICD-10-CM | POA: Diagnosis present

## 2020-03-01 DIAGNOSIS — Z87891 Personal history of nicotine dependence: Secondary | ICD-10-CM | POA: Insufficient documentation

## 2020-03-01 DIAGNOSIS — Z4822 Encounter for aftercare following kidney transplant: Secondary | ICD-10-CM | POA: Diagnosis not present

## 2020-03-01 DIAGNOSIS — D84821 Immunodeficiency due to drugs: Secondary | ICD-10-CM | POA: Diagnosis not present

## 2020-03-01 DIAGNOSIS — Z7982 Long term (current) use of aspirin: Secondary | ICD-10-CM | POA: Insufficient documentation

## 2020-03-01 DIAGNOSIS — I129 Hypertensive chronic kidney disease with stage 1 through stage 4 chronic kidney disease, or unspecified chronic kidney disease: Secondary | ICD-10-CM | POA: Diagnosis not present

## 2020-03-01 DIAGNOSIS — Z79899 Other long term (current) drug therapy: Secondary | ICD-10-CM | POA: Diagnosis not present

## 2020-03-01 DIAGNOSIS — E119 Type 2 diabetes mellitus without complications: Secondary | ICD-10-CM | POA: Insufficient documentation

## 2020-03-01 DIAGNOSIS — E875 Hyperkalemia: Secondary | ICD-10-CM | POA: Diagnosis not present

## 2020-03-01 DIAGNOSIS — H5440 Blindness, one eye, unspecified eye: Secondary | ICD-10-CM | POA: Diagnosis not present

## 2020-03-01 DIAGNOSIS — Z794 Long term (current) use of insulin: Secondary | ICD-10-CM | POA: Diagnosis not present

## 2020-03-01 DIAGNOSIS — E1042 Type 1 diabetes mellitus with diabetic polyneuropathy: Secondary | ICD-10-CM | POA: Diagnosis not present

## 2020-03-01 DIAGNOSIS — R0602 Shortness of breath: Secondary | ICD-10-CM | POA: Diagnosis not present

## 2020-03-01 DIAGNOSIS — I517 Cardiomegaly: Secondary | ICD-10-CM | POA: Diagnosis not present

## 2020-03-01 DIAGNOSIS — Z9114 Patient's other noncompliance with medication regimen: Secondary | ICD-10-CM | POA: Diagnosis not present

## 2020-03-01 DIAGNOSIS — T8619 Other complication of kidney transplant: Secondary | ICD-10-CM | POA: Diagnosis not present

## 2020-03-01 DIAGNOSIS — T8612 Kidney transplant failure: Secondary | ICD-10-CM | POA: Diagnosis not present

## 2020-03-01 DIAGNOSIS — N186 End stage renal disease: Secondary | ICD-10-CM | POA: Diagnosis not present

## 2020-03-01 DIAGNOSIS — Z940 Kidney transplant status: Secondary | ICD-10-CM | POA: Diagnosis not present

## 2020-03-01 DIAGNOSIS — N12 Tubulo-interstitial nephritis, not specified as acute or chronic: Secondary | ICD-10-CM | POA: Diagnosis not present

## 2020-03-01 DIAGNOSIS — D849 Immunodeficiency, unspecified: Secondary | ICD-10-CM | POA: Diagnosis not present

## 2020-03-01 DIAGNOSIS — I959 Hypotension, unspecified: Secondary | ICD-10-CM | POA: Diagnosis not present

## 2020-03-01 DIAGNOSIS — N412 Abscess of prostate: Secondary | ICD-10-CM | POA: Diagnosis not present

## 2020-03-01 DIAGNOSIS — J90 Pleural effusion, not elsewhere classified: Secondary | ICD-10-CM | POA: Diagnosis not present

## 2020-03-01 DIAGNOSIS — Z833 Family history of diabetes mellitus: Secondary | ICD-10-CM | POA: Diagnosis not present

## 2020-03-01 DIAGNOSIS — I272 Pulmonary hypertension, unspecified: Secondary | ICD-10-CM | POA: Diagnosis not present

## 2020-03-01 DIAGNOSIS — E785 Hyperlipidemia, unspecified: Secondary | ICD-10-CM | POA: Diagnosis not present

## 2020-03-01 DIAGNOSIS — Z5181 Encounter for therapeutic drug level monitoring: Secondary | ICD-10-CM | POA: Diagnosis not present

## 2020-03-01 DIAGNOSIS — N179 Acute kidney failure, unspecified: Secondary | ICD-10-CM | POA: Insufficient documentation

## 2020-03-01 DIAGNOSIS — E10311 Type 1 diabetes mellitus with unspecified diabetic retinopathy with macular edema: Secondary | ICD-10-CM | POA: Diagnosis not present

## 2020-03-01 DIAGNOSIS — Z20822 Contact with and (suspected) exposure to covid-19: Secondary | ICD-10-CM | POA: Diagnosis not present

## 2020-03-01 DIAGNOSIS — Z992 Dependence on renal dialysis: Secondary | ICD-10-CM | POA: Diagnosis not present

## 2020-03-01 DIAGNOSIS — E8770 Fluid overload, unspecified: Secondary | ICD-10-CM | POA: Diagnosis not present

## 2020-03-01 DIAGNOSIS — J849 Interstitial pulmonary disease, unspecified: Secondary | ICD-10-CM | POA: Diagnosis not present

## 2020-03-01 DIAGNOSIS — I10 Essential (primary) hypertension: Secondary | ICD-10-CM | POA: Diagnosis not present

## 2020-03-01 DIAGNOSIS — R7881 Bacteremia: Secondary | ICD-10-CM | POA: Diagnosis not present

## 2020-03-01 LAB — CBG MONITORING, ED: Glucose-Capillary: 77 mg/dL (ref 70–99)

## 2020-03-01 LAB — SARS CORONAVIRUS 2 BY RT PCR (HOSPITAL ORDER, PERFORMED IN ~~LOC~~ HOSPITAL LAB): SARS Coronavirus 2: NEGATIVE

## 2020-03-01 NOTE — ED Triage Notes (Addendum)
Pt seen at [facility] this am Pt brought in by EMS due to SOB for the past week and elevated kidney levels. Pt had kidney transplant last November. O2 sat 99 % on room air

## 2020-03-01 NOTE — ED Notes (Signed)
Patient states [facility] wanted pt to come back today. Pt unable and [facility] stated for pt to go to ER to be transferred to [facility] to evaluate kidney.

## 2020-03-01 NOTE — ED Provider Notes (Signed)
[facility] EMERGENCY DEPARTMENT Provider Note   CSN: [id number] Arrival date & time: 03/01/20  1301     History Chief Complaint  Patient presents with  . Abnormal Lab  . Shortness of Breath    Alejandro Lee is a 59 y.o. male.  HPI   89yM with pertinent past hx of CKD s/p renal transplant 06/27/19 at [facility]. Had clinic appointment today and then subsequently called back because renal function had worsened. Creatinine 6.05. Clinic note mentions plan for obtaining stat US and biopsy. Pt had already left the clinic when they called and [facility] was the closest hospital so he came here. He denies any new complaints since seen earlier today.     Past Medical History:  Diagnosis Date  . Anemia    1 blood transfusion  . Arthritis   . Back pain   . CHF (congestive heart failure) ([place])   . Chronic kidney disease    dialysis M/W/F  . Diabetes mellitus without complication ([place])   . DVT (deep venous thrombosis) ([place])   . GERD (gastroesophageal reflux disease)   . Headache    migraines  . History of cardiovascular stress test 11/2017   [facility]) negative dobutamine stress test  . Hyperlipidemia   . Hypertension   . Legally blind    right  . Myocardial infarction [facility])    patient states it was seen on EKG, denies a cardiac cath    Patient Active Problem List   Diagnosis Date Noted  . ESRD on hemodialysis ([place])   . Hyperkalemia 06/25/2018  . Non-compliance with renal dialysis ([place]) 06/25/2018  . Generalized abdominal pain 06/25/2018  . Chronic back pain 06/25/2018  . GERD (gastroesophageal reflux disease) 06/25/2018  . Chronic diastolic HF (heart failure) ([place]) 06/25/2018  . Class 1 obesity due to excess calories in adult 06/25/2018  . Benign essential HTN 06/25/2018  . 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[place]) 12/25/2017  . Type 2 diabetes mellitus with other specified complication ([place]) 02/10/1600  . ESRD (end stage renal disease) on dialysis ([place]) 12/19/2017  .  Chronic right-sided low back pain without sciatica 12/19/2017  . Pseudophakia of left eye 09/03/2017  . PDR (proliferative diabetic retinopathy) ([place]) 09/03/2017  . Cataract in degenerative disorder 09/03/2017  . Unspecified complication of internal prosthetic device, implant and graft, initial encounter 05/17/2017  . Rectal abscess 05/17/2017  . 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 Metabolic disorder 09/32/3557  . Hypertension 05/16/2017  . Dependence on renal dialysis ([place]) 05/16/2017  . Anemia of chronic renal failure, stage 5 (HCC) 05/16/2017  . Retinal detachment, tractional, both eyes 06/19/2012    Past Surgical History:  Procedure Laterality Date  . A/V FISTULAGRAM Right 07/22/2018   Procedure: A/V FISTULAGRAM;  Surgeon: Serafina Mitchell, MD;  Location: [place] [facility];  Service: Cardiovascular;  Laterality: Right;  . AV FISTULA PLACEMENT Right   . AV FISTULA PLACEMENT Right 32/20/2542   Procedure: PLICATION OF RIGHT arm FISTULA;  Surgeon: Waynetta Sandy, MD;  Location: [place];  Service: Vascular;  Laterality: Right;  . BASCILIC VEIN TRANSPOSITION Left 02/05/2019   Procedure: BRACHIOCEPHALIC FISTULA  LEFT ARM;  Surgeon: Serafina Mitchell, MD;  Location: [facility];  Service: Vascular;  Laterality: Left;  . COLONOSCOPY    . diabetic cyst removal Bilateral    on buttocks x8  . EYE SURGERY    . FISTULA SUPERFICIALIZATION Right 02/16/2375   Procedure: PLICATION OF ARTERIOVENOUS FISTULA RIGHT ARM;  Surgeon: Conrad [place], MD;  Location: [place];  Service: Vascular;  Laterality: Right;  . 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  . lens replacement Left   . LIGATION OF ARTERIOVENOUS  FISTULA  03/19/2019   Procedure: LIGATION OF ARTERIOVENOUS  OF COMPETING BRANCHES OF FISTULA LEFT UPPER ARM;  Surgeon: Serafina Mitchell, MD;  Location: [facility];  Service: Vascular;;  . LIGATION OF ARTERIOVENOUS  FISTULA Right 05/07/2019   Procedure: LIGATION OF  ARTERIOVENOUS  FISTULA RIGHT ARM;  Surgeon: Serafina Mitchell, MD;  Location: [place];  Service: Vascular;  Laterality: Right;  . NEPHRECTOMY TRANSPLANTED ORGAN    . PERIPHERAL VASCULAR BALLOON ANGIOPLASTY  07/22/2018   Procedure: PERIPHERAL VASCULAR BALLOON ANGIOPLASTY;  Surgeon: Serafina Mitchell, MD;  Location: [place] [facility];  Service: Cardiovascular;;  Right [place] fistula   . PERIPHERAL VASCULAR BALLOON ANGIOPLASTY Right 02/03/2019   Procedure: PERIPHERAL VASCULAR BALLOON ANGIOPLASTY;  Surgeon: Serafina Mitchell, MD;  Location: [place] [facility];  Service: Cardiovascular;  Laterality: Right;  RUE AVF  . REFRACTIVE SURGERY Bilateral        Family History  Problem Relation Age of Onset  . Diabetes Mother   . Hypertension Mother   . Diabetes Father   . Heart disease Father   . Cancer Father   . Hypertension Father   . Diabetes Sister   . Hypertension Sister   . Diabetes Brother   . Hypertension Brother   . Cancer Brother     Social History   Tobacco Use  . Smoking status: Former Smoker    Types: Cigarettes  . Smokeless tobacco: Never Used  Vaping Use  . Vaping Use: Never used  Substance Use Topics  . Alcohol use: Not Currently  . Drug use: Never    Home Medications Prior to Admission medications   Medication Sig Start Date End Date Taking? Authorizing Provider  acetaminophen (TYLENOL) 500 MG tablet Take 1,000 mg by mouth every 6 (six) hours as needed for moderate pain.    [provider]  albuterol (VENTOLIN HFA) 108 (90 Base) MCG/ACT inhaler Inhale 2 puffs into the lungs every 6 (six) hours as needed for wheezing or shortness of breath.    [provider]  amLODipine (NORVASC) 10 MG tablet Take 10 mg by mouth daily. 07/30/19   [provider]  aspirin EC 81 MG tablet Take 81 mg by mouth daily.    [provider]  ENVARSUS XR 1 MG TB24 Take 18 mg by mouth daily.  07/01/19   [provider]  labetalol (NORMODYNE) 200 MG  tablet Take 200 mg by mouth 3 (three) times daily.  07/01/19   [provider]  LANTUS SOLOSTAR 100 UNIT/ML Solostar Pen Inject 35 Units into the skin at bedtime.  07/17/19   [provider]  mycophenolate (MYFORTIC) 180 MG EC tablet Take 360 mg by mouth 2 (two) times daily.  08/03/19   [provider]  NOVOLOG FLEXPEN 100 UNIT/ML FlexPen Inject 10 Units into the skin 3 (three) times daily with meals.  07/20/19   [provider]  oxyCODONE-acetaminophen (PERCOCET/ROXICET) 5-325 MG tablet Take 1 tablet by mouth every 4 (four) hours as needed for severe pain. Patient not taking: Reported on 08/08/2019 04/13/19   Serafina Mitchell, MD  pantoprazole (PROTONIX) 40 MG tablet Take 40 mg by mouth daily. 07/01/19   [provider]  sulfamethoxazole-trimethoprim (BACTRIM) 400-80 MG tablet Take 1 tablet by mouth every Monday, Wednesday,  and Friday.    [provider]  valGANciclovir (VALCYTE) 450 MG tablet Take 450 mg by mouth every Monday, Wednesday, and Friday.    [provider]    Allergies    Patient has no known allergies.  Review of Systems   Review of Systems All systems reviewed and negative, other than as noted in HPI. Physical Exam Updated Vital Signs BP (!) 161/67 (BP Location: Right Arm)   Pulse 72   Temp 98.7 F (37.1 C) (Oral)   Resp (!) 22   Wt 92.1 kg   SpO2 99%   BMI 31.80 kg/m   Physical Exam Vitals and nursing note reviewed.  Constitutional:      General: He is not in acute distress.    Appearance: He is well-developed.     Comments: Sitting in bed. NAD.   HENT:     Head: Normocephalic and atraumatic.  Eyes:     General:        Right eye: No discharge.        Left eye: No discharge.     Conjunctiva/sclera: Conjunctivae normal.  Cardiovascular:     Rate and Rhythm: Normal rate and regular rhythm.     Heart sounds: Normal heart sounds. No murmur heard.  No friction rub. No gallop.   Pulmonary:     Effort:  Pulmonary effort is normal. No respiratory distress.     Breath sounds: Normal breath sounds.  Abdominal:     General: There is no distension.     Palpations: Abdomen is soft.     Tenderness: There is no abdominal tenderness.     Comments: Multiple surgical scars. Large R abdominal wall hernia. Non-tender.   Musculoskeletal:        General: No tenderness.     Cervical back: Neck supple.  Skin:    General: Skin is warm and dry.  Neurological:     Mental Status: He is alert.  Psychiatric:        Behavior: Behavior normal.        Thought Content: Thought content normal.     ED Results / Procedures / Treatments   Labs (all labs ordered are listed, but only abnormal results are displayed) Labs Reviewed - No data to display  EKG None  Radiology No results found.  Procedures Procedures (including critical care time)  Medications Ordered in ED Medications - No data to display  ED Course  I have reviewed the triage vital signs and the nursing notes.  Pertinent labs & imaging results that were available during my care of the patient were reviewed by me and considered in my medical decision making (see chart for details).    MDM Rules/Calculators/A&P                          58yM s/p renal transplant with worsening renal function. Transfer to [facility].   Final Clinical Impression(s) / ED Diagnoses Final diagnoses:  Acute renal failure superimposed on chronic kidney disease, unspecified CKD stage, unspecified acute renal failure type [facility])  Renal transplant recipient    Rx / DC Orders ED Discharge Orders    None       Virgel Manifold, MD 03/01/20 1629

## 2020-03-11 DIAGNOSIS — I10 Essential (primary) hypertension: Secondary | ICD-10-CM | POA: Diagnosis not present

## 2020-03-12 DIAGNOSIS — I10 Essential (primary) hypertension: Secondary | ICD-10-CM | POA: Diagnosis not present

## 2020-03-15 DIAGNOSIS — E119 Type 2 diabetes mellitus without complications: Secondary | ICD-10-CM | POA: Diagnosis not present

## 2020-03-15 DIAGNOSIS — D631 Anemia in chronic kidney disease: Secondary | ICD-10-CM | POA: Diagnosis not present

## 2020-03-15 DIAGNOSIS — I259 Chronic ischemic heart disease, unspecified: Secondary | ICD-10-CM | POA: Diagnosis not present

## 2020-03-15 DIAGNOSIS — Z992 Dependence on renal dialysis: Secondary | ICD-10-CM | POA: Diagnosis not present

## 2020-03-15 DIAGNOSIS — N186 End stage renal disease: Secondary | ICD-10-CM | POA: Diagnosis not present

## 2020-03-15 DIAGNOSIS — Z794 Long term (current) use of insulin: Secondary | ICD-10-CM | POA: Diagnosis not present

## 2020-03-15 DIAGNOSIS — N2581 Secondary hyperparathyroidism of renal origin: Secondary | ICD-10-CM | POA: Diagnosis not present

## 2020-03-19 DIAGNOSIS — N2581 Secondary hyperparathyroidism of renal origin: Secondary | ICD-10-CM | POA: Diagnosis not present

## 2020-03-19 DIAGNOSIS — D631 Anemia in chronic kidney disease: Secondary | ICD-10-CM | POA: Diagnosis not present

## 2020-03-19 DIAGNOSIS — N186 End stage renal disease: Secondary | ICD-10-CM | POA: Diagnosis not present

## 2020-03-19 DIAGNOSIS — Z992 Dependence on renal dialysis: Secondary | ICD-10-CM | POA: Diagnosis not present

## 2020-03-22 DIAGNOSIS — N186 End stage renal disease: Secondary | ICD-10-CM | POA: Diagnosis not present

## 2020-03-22 DIAGNOSIS — Z992 Dependence on renal dialysis: Secondary | ICD-10-CM | POA: Diagnosis not present

## 2020-03-22 DIAGNOSIS — D631 Anemia in chronic kidney disease: Secondary | ICD-10-CM | POA: Diagnosis not present

## 2020-03-26 DIAGNOSIS — N186 End stage renal disease: Secondary | ICD-10-CM | POA: Diagnosis not present

## 2020-03-26 DIAGNOSIS — D631 Anemia in chronic kidney disease: Secondary | ICD-10-CM | POA: Diagnosis not present

## 2020-03-26 DIAGNOSIS — Z992 Dependence on renal dialysis: Secondary | ICD-10-CM | POA: Diagnosis not present

## 2020-04-02 DIAGNOSIS — D631 Anemia in chronic kidney disease: Secondary | ICD-10-CM | POA: Diagnosis not present

## 2020-04-02 DIAGNOSIS — N186 End stage renal disease: Secondary | ICD-10-CM | POA: Diagnosis not present

## 2020-04-02 DIAGNOSIS — Z992 Dependence on renal dialysis: Secondary | ICD-10-CM | POA: Diagnosis not present

## 2020-04-05 DIAGNOSIS — Z992 Dependence on renal dialysis: Secondary | ICD-10-CM | POA: Diagnosis not present

## 2020-04-05 DIAGNOSIS — D631 Anemia in chronic kidney disease: Secondary | ICD-10-CM | POA: Diagnosis not present

## 2020-04-05 DIAGNOSIS — N186 End stage renal disease: Secondary | ICD-10-CM | POA: Diagnosis not present

## 2020-04-09 DIAGNOSIS — N186 End stage renal disease: Secondary | ICD-10-CM | POA: Diagnosis not present

## 2020-04-09 DIAGNOSIS — Z992 Dependence on renal dialysis: Secondary | ICD-10-CM | POA: Diagnosis not present

## 2020-04-09 DIAGNOSIS — D631 Anemia in chronic kidney disease: Secondary | ICD-10-CM | POA: Diagnosis not present

## 2020-04-12 DIAGNOSIS — I10 Essential (primary) hypertension: Secondary | ICD-10-CM | POA: Diagnosis not present

## 2020-04-12 DIAGNOSIS — Z992 Dependence on renal dialysis: Secondary | ICD-10-CM | POA: Diagnosis not present

## 2020-04-12 DIAGNOSIS — D631 Anemia in chronic kidney disease: Secondary | ICD-10-CM | POA: Diagnosis not present

## 2020-04-12 DIAGNOSIS — N186 End stage renal disease: Secondary | ICD-10-CM | POA: Diagnosis not present

## 2020-04-14 DIAGNOSIS — R110 Nausea: Secondary | ICD-10-CM | POA: Diagnosis not present

## 2020-04-14 DIAGNOSIS — N186 End stage renal disease: Secondary | ICD-10-CM | POA: Diagnosis not present

## 2020-04-14 DIAGNOSIS — D509 Iron deficiency anemia, unspecified: Secondary | ICD-10-CM | POA: Diagnosis not present

## 2020-04-14 DIAGNOSIS — Z992 Dependence on renal dialysis: Secondary | ICD-10-CM | POA: Diagnosis not present

## 2020-04-14 DIAGNOSIS — N2581 Secondary hyperparathyroidism of renal origin: Secondary | ICD-10-CM | POA: Diagnosis not present

## 2020-04-14 DIAGNOSIS — R1110 Vomiting, unspecified: Secondary | ICD-10-CM | POA: Diagnosis not present

## 2020-04-14 DIAGNOSIS — D631 Anemia in chronic kidney disease: Secondary | ICD-10-CM | POA: Diagnosis not present

## 2020-04-15 DIAGNOSIS — Z4822 Encounter for aftercare following kidney transplant: Secondary | ICD-10-CM | POA: Diagnosis not present

## 2020-04-15 DIAGNOSIS — Z992 Dependence on renal dialysis: Secondary | ICD-10-CM | POA: Diagnosis not present

## 2020-04-15 DIAGNOSIS — E119 Type 2 diabetes mellitus without complications: Secondary | ICD-10-CM | POA: Diagnosis not present

## 2020-04-15 DIAGNOSIS — Z7952 Long term (current) use of systemic steroids: Secondary | ICD-10-CM | POA: Diagnosis not present

## 2020-04-15 DIAGNOSIS — D649 Anemia, unspecified: Secondary | ICD-10-CM | POA: Diagnosis not present

## 2020-04-15 DIAGNOSIS — E872 Acidosis: Secondary | ICD-10-CM | POA: Diagnosis not present

## 2020-04-15 DIAGNOSIS — I10 Essential (primary) hypertension: Secondary | ICD-10-CM | POA: Diagnosis not present

## 2020-04-15 DIAGNOSIS — Z87898 Personal history of other specified conditions: Secondary | ICD-10-CM | POA: Diagnosis not present

## 2020-04-15 DIAGNOSIS — B965 Pseudomonas (aeruginosa) (mallei) (pseudomallei) as the cause of diseases classified elsewhere: Secondary | ICD-10-CM | POA: Diagnosis not present

## 2020-04-15 DIAGNOSIS — R7881 Bacteremia: Secondary | ICD-10-CM | POA: Diagnosis not present

## 2020-04-15 DIAGNOSIS — D849 Immunodeficiency, unspecified: Secondary | ICD-10-CM | POA: Diagnosis not present

## 2020-04-15 DIAGNOSIS — Z794 Long term (current) use of insulin: Secondary | ICD-10-CM | POA: Diagnosis not present

## 2020-04-15 DIAGNOSIS — Z9889 Other specified postprocedural states: Secondary | ICD-10-CM | POA: Diagnosis not present

## 2020-04-15 DIAGNOSIS — Z87448 Personal history of other diseases of urinary system: Secondary | ICD-10-CM | POA: Diagnosis not present

## 2020-04-15 DIAGNOSIS — R1909 Other intra-abdominal and pelvic swelling, mass and lump: Secondary | ICD-10-CM | POA: Diagnosis not present

## 2020-04-15 DIAGNOSIS — Z79899 Other long term (current) drug therapy: Secondary | ICD-10-CM | POA: Diagnosis not present

## 2020-04-15 DIAGNOSIS — Z940 Kidney transplant status: Secondary | ICD-10-CM | POA: Diagnosis not present

## 2020-04-15 DIAGNOSIS — Z792 Long term (current) use of antibiotics: Secondary | ICD-10-CM | POA: Diagnosis not present

## 2020-04-15 DIAGNOSIS — N412 Abscess of prostate: Secondary | ICD-10-CM | POA: Diagnosis not present

## 2020-04-15 DIAGNOSIS — Z8619 Personal history of other infectious and parasitic diseases: Secondary | ICD-10-CM | POA: Diagnosis not present

## 2020-04-15 DIAGNOSIS — E785 Hyperlipidemia, unspecified: Secondary | ICD-10-CM | POA: Diagnosis not present

## 2020-04-18 DIAGNOSIS — Z992 Dependence on renal dialysis: Secondary | ICD-10-CM | POA: Diagnosis not present

## 2020-04-18 DIAGNOSIS — N2581 Secondary hyperparathyroidism of renal origin: Secondary | ICD-10-CM | POA: Diagnosis not present

## 2020-04-18 DIAGNOSIS — N186 End stage renal disease: Secondary | ICD-10-CM | POA: Diagnosis not present

## 2020-04-18 DIAGNOSIS — R1110 Vomiting, unspecified: Secondary | ICD-10-CM | POA: Diagnosis not present

## 2020-04-18 DIAGNOSIS — R110 Nausea: Secondary | ICD-10-CM | POA: Diagnosis not present

## 2020-04-18 DIAGNOSIS — D509 Iron deficiency anemia, unspecified: Secondary | ICD-10-CM | POA: Diagnosis not present

## 2020-04-20 DIAGNOSIS — Z992 Dependence on renal dialysis: Secondary | ICD-10-CM | POA: Diagnosis not present

## 2020-04-20 DIAGNOSIS — N2581 Secondary hyperparathyroidism of renal origin: Secondary | ICD-10-CM | POA: Diagnosis not present

## 2020-04-20 DIAGNOSIS — N186 End stage renal disease: Secondary | ICD-10-CM | POA: Diagnosis not present

## 2020-04-20 DIAGNOSIS — R1110 Vomiting, unspecified: Secondary | ICD-10-CM | POA: Diagnosis not present

## 2020-04-20 DIAGNOSIS — R110 Nausea: Secondary | ICD-10-CM | POA: Diagnosis not present

## 2020-04-20 DIAGNOSIS — D509 Iron deficiency anemia, unspecified: Secondary | ICD-10-CM | POA: Diagnosis not present

## 2020-04-22 DIAGNOSIS — D509 Iron deficiency anemia, unspecified: Secondary | ICD-10-CM | POA: Diagnosis not present

## 2020-04-22 DIAGNOSIS — Z992 Dependence on renal dialysis: Secondary | ICD-10-CM | POA: Diagnosis not present

## 2020-04-22 DIAGNOSIS — N2581 Secondary hyperparathyroidism of renal origin: Secondary | ICD-10-CM | POA: Diagnosis not present

## 2020-04-22 DIAGNOSIS — R110 Nausea: Secondary | ICD-10-CM | POA: Diagnosis not present

## 2020-04-22 DIAGNOSIS — N186 End stage renal disease: Secondary | ICD-10-CM | POA: Diagnosis not present

## 2020-04-22 DIAGNOSIS — R1110 Vomiting, unspecified: Secondary | ICD-10-CM | POA: Diagnosis not present

## 2020-04-27 DIAGNOSIS — D509 Iron deficiency anemia, unspecified: Secondary | ICD-10-CM | POA: Diagnosis not present

## 2020-04-27 DIAGNOSIS — N2581 Secondary hyperparathyroidism of renal origin: Secondary | ICD-10-CM | POA: Diagnosis not present

## 2020-04-27 DIAGNOSIS — R110 Nausea: Secondary | ICD-10-CM | POA: Diagnosis not present

## 2020-04-27 DIAGNOSIS — Z992 Dependence on renal dialysis: Secondary | ICD-10-CM | POA: Diagnosis not present

## 2020-04-27 DIAGNOSIS — R1110 Vomiting, unspecified: Secondary | ICD-10-CM | POA: Diagnosis not present

## 2020-04-27 DIAGNOSIS — N186 End stage renal disease: Secondary | ICD-10-CM | POA: Diagnosis not present

## 2020-04-29 DIAGNOSIS — Z992 Dependence on renal dialysis: Secondary | ICD-10-CM | POA: Diagnosis not present

## 2020-04-29 DIAGNOSIS — N2581 Secondary hyperparathyroidism of renal origin: Secondary | ICD-10-CM | POA: Diagnosis not present

## 2020-04-29 DIAGNOSIS — R1110 Vomiting, unspecified: Secondary | ICD-10-CM | POA: Diagnosis not present

## 2020-04-29 DIAGNOSIS — R110 Nausea: Secondary | ICD-10-CM | POA: Diagnosis not present

## 2020-04-29 DIAGNOSIS — N186 End stage renal disease: Secondary | ICD-10-CM | POA: Diagnosis not present

## 2020-04-29 DIAGNOSIS — D509 Iron deficiency anemia, unspecified: Secondary | ICD-10-CM | POA: Diagnosis not present

## 2020-05-02 DIAGNOSIS — D509 Iron deficiency anemia, unspecified: Secondary | ICD-10-CM | POA: Diagnosis not present

## 2020-05-02 DIAGNOSIS — R110 Nausea: Secondary | ICD-10-CM | POA: Diagnosis not present

## 2020-05-02 DIAGNOSIS — N2581 Secondary hyperparathyroidism of renal origin: Secondary | ICD-10-CM | POA: Diagnosis not present

## 2020-05-02 DIAGNOSIS — Z992 Dependence on renal dialysis: Secondary | ICD-10-CM | POA: Diagnosis not present

## 2020-05-02 DIAGNOSIS — N186 End stage renal disease: Secondary | ICD-10-CM | POA: Diagnosis not present

## 2020-05-02 DIAGNOSIS — R1110 Vomiting, unspecified: Secondary | ICD-10-CM | POA: Diagnosis not present

## 2020-05-04 DIAGNOSIS — D509 Iron deficiency anemia, unspecified: Secondary | ICD-10-CM | POA: Diagnosis not present

## 2020-05-04 DIAGNOSIS — Z992 Dependence on renal dialysis: Secondary | ICD-10-CM | POA: Diagnosis not present

## 2020-05-04 DIAGNOSIS — R110 Nausea: Secondary | ICD-10-CM | POA: Diagnosis not present

## 2020-05-04 DIAGNOSIS — N2581 Secondary hyperparathyroidism of renal origin: Secondary | ICD-10-CM | POA: Diagnosis not present

## 2020-05-04 DIAGNOSIS — N186 End stage renal disease: Secondary | ICD-10-CM | POA: Diagnosis not present

## 2020-05-04 DIAGNOSIS — R1110 Vomiting, unspecified: Secondary | ICD-10-CM | POA: Diagnosis not present

## 2020-05-09 DIAGNOSIS — R110 Nausea: Secondary | ICD-10-CM | POA: Diagnosis not present

## 2020-05-09 DIAGNOSIS — N186 End stage renal disease: Secondary | ICD-10-CM | POA: Diagnosis not present

## 2020-05-09 DIAGNOSIS — Z992 Dependence on renal dialysis: Secondary | ICD-10-CM | POA: Diagnosis not present

## 2020-05-09 DIAGNOSIS — R1110 Vomiting, unspecified: Secondary | ICD-10-CM | POA: Diagnosis not present

## 2020-05-09 DIAGNOSIS — N2581 Secondary hyperparathyroidism of renal origin: Secondary | ICD-10-CM | POA: Diagnosis not present

## 2020-05-09 DIAGNOSIS — D509 Iron deficiency anemia, unspecified: Secondary | ICD-10-CM | POA: Diagnosis not present

## 2020-05-12 DIAGNOSIS — Z992 Dependence on renal dialysis: Secondary | ICD-10-CM | POA: Diagnosis not present

## 2020-05-12 DIAGNOSIS — Z79899 Other long term (current) drug therapy: Secondary | ICD-10-CM | POA: Diagnosis not present

## 2020-05-12 DIAGNOSIS — I10 Essential (primary) hypertension: Secondary | ICD-10-CM | POA: Diagnosis not present

## 2020-05-12 DIAGNOSIS — K529 Noninfective gastroenteritis and colitis, unspecified: Secondary | ICD-10-CM | POA: Diagnosis not present

## 2020-05-12 DIAGNOSIS — R1900 Intra-abdominal and pelvic swelling, mass and lump, unspecified site: Secondary | ICD-10-CM | POA: Diagnosis not present

## 2020-05-12 DIAGNOSIS — T8619 Other complication of kidney transplant: Secondary | ICD-10-CM | POA: Diagnosis not present

## 2020-05-12 DIAGNOSIS — Z7952 Long term (current) use of systemic steroids: Secondary | ICD-10-CM | POA: Diagnosis not present

## 2020-05-12 DIAGNOSIS — B965 Pseudomonas (aeruginosa) (mallei) (pseudomallei) as the cause of diseases classified elsewhere: Secondary | ICD-10-CM | POA: Diagnosis not present

## 2020-05-12 DIAGNOSIS — Z5181 Encounter for therapeutic drug level monitoring: Secondary | ICD-10-CM | POA: Diagnosis not present

## 2020-05-12 DIAGNOSIS — Z792 Long term (current) use of antibiotics: Secondary | ICD-10-CM | POA: Diagnosis not present

## 2020-05-12 DIAGNOSIS — Z940 Kidney transplant status: Secondary | ICD-10-CM | POA: Diagnosis not present

## 2020-05-12 DIAGNOSIS — E1165 Type 2 diabetes mellitus with hyperglycemia: Secondary | ICD-10-CM | POA: Diagnosis not present

## 2020-05-12 DIAGNOSIS — D649 Anemia, unspecified: Secondary | ICD-10-CM | POA: Diagnosis not present

## 2020-05-12 DIAGNOSIS — K5901 Slow transit constipation: Secondary | ICD-10-CM | POA: Diagnosis not present

## 2020-05-12 DIAGNOSIS — R0609 Other forms of dyspnea: Secondary | ICD-10-CM | POA: Diagnosis not present

## 2020-05-12 DIAGNOSIS — I151 Hypertension secondary to other renal disorders: Secondary | ICD-10-CM | POA: Diagnosis not present

## 2020-05-12 DIAGNOSIS — R7881 Bacteremia: Secondary | ICD-10-CM | POA: Diagnosis not present

## 2020-05-12 DIAGNOSIS — E119 Type 2 diabetes mellitus without complications: Secondary | ICD-10-CM | POA: Diagnosis not present

## 2020-05-12 DIAGNOSIS — Z794 Long term (current) use of insulin: Secondary | ICD-10-CM | POA: Diagnosis not present

## 2020-05-12 DIAGNOSIS — I272 Pulmonary hypertension, unspecified: Secondary | ICD-10-CM | POA: Diagnosis not present

## 2020-05-12 DIAGNOSIS — N412 Abscess of prostate: Secondary | ICD-10-CM | POA: Diagnosis not present

## 2020-05-12 DIAGNOSIS — N186 End stage renal disease: Secondary | ICD-10-CM | POA: Diagnosis not present

## 2020-05-12 DIAGNOSIS — D849 Immunodeficiency, unspecified: Secondary | ICD-10-CM | POA: Diagnosis not present

## 2020-05-12 DIAGNOSIS — B349 Viral infection, unspecified: Secondary | ICD-10-CM | POA: Diagnosis not present

## 2020-05-12 DIAGNOSIS — Z4822 Encounter for aftercare following kidney transplant: Secondary | ICD-10-CM | POA: Diagnosis not present

## 2020-05-12 DIAGNOSIS — B348 Other viral infections of unspecified site: Secondary | ICD-10-CM | POA: Diagnosis not present

## 2020-05-12 DIAGNOSIS — E872 Acidosis: Secondary | ICD-10-CM | POA: Diagnosis not present

## 2020-05-13 DIAGNOSIS — D509 Iron deficiency anemia, unspecified: Secondary | ICD-10-CM | POA: Diagnosis not present

## 2020-05-13 DIAGNOSIS — D631 Anemia in chronic kidney disease: Secondary | ICD-10-CM | POA: Diagnosis not present

## 2020-05-13 DIAGNOSIS — N186 End stage renal disease: Secondary | ICD-10-CM | POA: Diagnosis not present

## 2020-05-13 DIAGNOSIS — Z992 Dependence on renal dialysis: Secondary | ICD-10-CM | POA: Diagnosis not present

## 2020-05-13 DIAGNOSIS — N2581 Secondary hyperparathyroidism of renal origin: Secondary | ICD-10-CM | POA: Diagnosis not present

## 2020-05-13 DIAGNOSIS — Z23 Encounter for immunization: Secondary | ICD-10-CM | POA: Diagnosis not present

## 2020-05-18 DIAGNOSIS — D631 Anemia in chronic kidney disease: Secondary | ICD-10-CM | POA: Diagnosis not present

## 2020-05-18 DIAGNOSIS — D509 Iron deficiency anemia, unspecified: Secondary | ICD-10-CM | POA: Diagnosis not present

## 2020-05-18 DIAGNOSIS — Z23 Encounter for immunization: Secondary | ICD-10-CM | POA: Diagnosis not present

## 2020-05-18 DIAGNOSIS — N186 End stage renal disease: Secondary | ICD-10-CM | POA: Diagnosis not present

## 2020-05-18 DIAGNOSIS — Z992 Dependence on renal dialysis: Secondary | ICD-10-CM | POA: Diagnosis not present

## 2020-05-18 DIAGNOSIS — N2581 Secondary hyperparathyroidism of renal origin: Secondary | ICD-10-CM | POA: Diagnosis not present

## 2020-05-27 DIAGNOSIS — I10 Essential (primary) hypertension: Secondary | ICD-10-CM | POA: Diagnosis not present

## 2020-05-27 DIAGNOSIS — Z23 Encounter for immunization: Secondary | ICD-10-CM | POA: Diagnosis not present

## 2020-05-27 DIAGNOSIS — N2581 Secondary hyperparathyroidism of renal origin: Secondary | ICD-10-CM | POA: Diagnosis not present

## 2020-05-27 DIAGNOSIS — Z79899 Other long term (current) drug therapy: Secondary | ICD-10-CM | POA: Diagnosis not present

## 2020-05-27 DIAGNOSIS — D509 Iron deficiency anemia, unspecified: Secondary | ICD-10-CM | POA: Diagnosis not present

## 2020-05-27 DIAGNOSIS — Z6832 Body mass index (BMI) 32.0-32.9, adult: Secondary | ICD-10-CM | POA: Diagnosis not present

## 2020-05-27 DIAGNOSIS — N186 End stage renal disease: Secondary | ICD-10-CM | POA: Diagnosis not present

## 2020-05-27 DIAGNOSIS — D849 Immunodeficiency, unspecified: Secondary | ICD-10-CM | POA: Diagnosis not present

## 2020-05-27 DIAGNOSIS — Z992 Dependence on renal dialysis: Secondary | ICD-10-CM | POA: Diagnosis not present

## 2020-05-27 DIAGNOSIS — D631 Anemia in chronic kidney disease: Secondary | ICD-10-CM | POA: Diagnosis not present

## 2020-05-27 DIAGNOSIS — E109 Type 1 diabetes mellitus without complications: Secondary | ICD-10-CM | POA: Diagnosis not present

## 2020-05-27 DIAGNOSIS — Z299 Encounter for prophylactic measures, unspecified: Secondary | ICD-10-CM | POA: Diagnosis not present

## 2020-05-27 LAB — HEMOGLOBIN A1C: Hemoglobin A1C: 9.3

## 2020-06-03 DIAGNOSIS — Z23 Encounter for immunization: Secondary | ICD-10-CM | POA: Diagnosis not present

## 2020-06-03 DIAGNOSIS — D631 Anemia in chronic kidney disease: Secondary | ICD-10-CM | POA: Diagnosis not present

## 2020-06-03 DIAGNOSIS — Z992 Dependence on renal dialysis: Secondary | ICD-10-CM | POA: Diagnosis not present

## 2020-06-03 DIAGNOSIS — N2581 Secondary hyperparathyroidism of renal origin: Secondary | ICD-10-CM | POA: Diagnosis not present

## 2020-06-03 DIAGNOSIS — D509 Iron deficiency anemia, unspecified: Secondary | ICD-10-CM | POA: Diagnosis not present

## 2020-06-03 DIAGNOSIS — N186 End stage renal disease: Secondary | ICD-10-CM | POA: Diagnosis not present

## 2020-06-06 DIAGNOSIS — D509 Iron deficiency anemia, unspecified: Secondary | ICD-10-CM | POA: Diagnosis not present

## 2020-06-06 DIAGNOSIS — D631 Anemia in chronic kidney disease: Secondary | ICD-10-CM | POA: Diagnosis not present

## 2020-06-06 DIAGNOSIS — N186 End stage renal disease: Secondary | ICD-10-CM | POA: Diagnosis not present

## 2020-06-06 DIAGNOSIS — Z992 Dependence on renal dialysis: Secondary | ICD-10-CM | POA: Diagnosis not present

## 2020-06-06 DIAGNOSIS — Z23 Encounter for immunization: Secondary | ICD-10-CM | POA: Diagnosis not present

## 2020-06-06 DIAGNOSIS — N2581 Secondary hyperparathyroidism of renal origin: Secondary | ICD-10-CM | POA: Diagnosis not present

## 2020-06-10 DIAGNOSIS — N2581 Secondary hyperparathyroidism of renal origin: Secondary | ICD-10-CM | POA: Diagnosis not present

## 2020-06-10 DIAGNOSIS — Z992 Dependence on renal dialysis: Secondary | ICD-10-CM | POA: Diagnosis not present

## 2020-06-10 DIAGNOSIS — Z23 Encounter for immunization: Secondary | ICD-10-CM | POA: Diagnosis not present

## 2020-06-10 DIAGNOSIS — D631 Anemia in chronic kidney disease: Secondary | ICD-10-CM | POA: Diagnosis not present

## 2020-06-10 DIAGNOSIS — D509 Iron deficiency anemia, unspecified: Secondary | ICD-10-CM | POA: Diagnosis not present

## 2020-06-10 DIAGNOSIS — N186 End stage renal disease: Secondary | ICD-10-CM | POA: Diagnosis not present

## 2020-06-11 DIAGNOSIS — I10 Essential (primary) hypertension: Secondary | ICD-10-CM | POA: Diagnosis not present

## 2020-06-12 DIAGNOSIS — N186 End stage renal disease: Secondary | ICD-10-CM | POA: Diagnosis not present

## 2020-06-12 DIAGNOSIS — Z992 Dependence on renal dialysis: Secondary | ICD-10-CM | POA: Diagnosis not present

## 2020-06-15 DIAGNOSIS — D631 Anemia in chronic kidney disease: Secondary | ICD-10-CM | POA: Diagnosis not present

## 2020-06-15 DIAGNOSIS — N2581 Secondary hyperparathyroidism of renal origin: Secondary | ICD-10-CM | POA: Diagnosis not present

## 2020-06-15 DIAGNOSIS — N186 End stage renal disease: Secondary | ICD-10-CM | POA: Diagnosis not present

## 2020-06-15 DIAGNOSIS — Z992 Dependence on renal dialysis: Secondary | ICD-10-CM | POA: Diagnosis not present

## 2020-06-15 DIAGNOSIS — D509 Iron deficiency anemia, unspecified: Secondary | ICD-10-CM | POA: Diagnosis not present

## 2020-06-21 DIAGNOSIS — T8611 Kidney transplant rejection: Secondary | ICD-10-CM | POA: Diagnosis not present

## 2020-06-21 DIAGNOSIS — E1159 Type 2 diabetes mellitus with other circulatory complications: Secondary | ICD-10-CM | POA: Diagnosis not present

## 2020-06-21 DIAGNOSIS — N170 Acute kidney failure with tubular necrosis: Secondary | ICD-10-CM | POA: Diagnosis not present

## 2020-06-21 DIAGNOSIS — N186 End stage renal disease: Secondary | ICD-10-CM | POA: Diagnosis not present

## 2020-06-21 DIAGNOSIS — Z4822 Encounter for aftercare following kidney transplant: Secondary | ICD-10-CM | POA: Diagnosis not present

## 2020-06-21 DIAGNOSIS — D649 Anemia, unspecified: Secondary | ICD-10-CM | POA: Diagnosis not present

## 2020-06-21 DIAGNOSIS — B348 Other viral infections of unspecified site: Secondary | ICD-10-CM | POA: Diagnosis not present

## 2020-06-21 DIAGNOSIS — E119 Type 2 diabetes mellitus without complications: Secondary | ICD-10-CM | POA: Diagnosis not present

## 2020-06-21 DIAGNOSIS — R197 Diarrhea, unspecified: Secondary | ICD-10-CM | POA: Diagnosis not present

## 2020-06-21 DIAGNOSIS — D849 Immunodeficiency, unspecified: Secondary | ICD-10-CM | POA: Diagnosis not present

## 2020-06-21 DIAGNOSIS — Z5181 Encounter for therapeutic drug level monitoring: Secondary | ICD-10-CM | POA: Diagnosis not present

## 2020-06-21 DIAGNOSIS — I151 Hypertension secondary to other renal disorders: Secondary | ICD-10-CM | POA: Diagnosis not present

## 2020-06-21 DIAGNOSIS — Z794 Long term (current) use of insulin: Secondary | ICD-10-CM | POA: Diagnosis not present

## 2020-06-21 DIAGNOSIS — Z940 Kidney transplant status: Secondary | ICD-10-CM | POA: Diagnosis not present

## 2020-06-21 DIAGNOSIS — I10 Essential (primary) hypertension: Secondary | ICD-10-CM | POA: Diagnosis not present

## 2020-06-21 DIAGNOSIS — Z992 Dependence on renal dialysis: Secondary | ICD-10-CM | POA: Diagnosis not present

## 2020-06-21 DIAGNOSIS — N412 Abscess of prostate: Secondary | ICD-10-CM | POA: Diagnosis not present

## 2020-06-21 DIAGNOSIS — R7881 Bacteremia: Secondary | ICD-10-CM | POA: Diagnosis not present

## 2020-06-21 DIAGNOSIS — Z79899 Other long term (current) drug therapy: Secondary | ICD-10-CM | POA: Diagnosis not present

## 2020-06-22 DIAGNOSIS — D509 Iron deficiency anemia, unspecified: Secondary | ICD-10-CM | POA: Diagnosis not present

## 2020-06-22 DIAGNOSIS — D631 Anemia in chronic kidney disease: Secondary | ICD-10-CM | POA: Diagnosis not present

## 2020-06-22 DIAGNOSIS — Z992 Dependence on renal dialysis: Secondary | ICD-10-CM | POA: Diagnosis not present

## 2020-06-22 DIAGNOSIS — N2581 Secondary hyperparathyroidism of renal origin: Secondary | ICD-10-CM | POA: Diagnosis not present

## 2020-06-22 DIAGNOSIS — N186 End stage renal disease: Secondary | ICD-10-CM | POA: Diagnosis not present

## 2020-06-27 DIAGNOSIS — Z992 Dependence on renal dialysis: Secondary | ICD-10-CM | POA: Diagnosis not present

## 2020-06-27 DIAGNOSIS — D631 Anemia in chronic kidney disease: Secondary | ICD-10-CM | POA: Diagnosis not present

## 2020-06-27 DIAGNOSIS — N2581 Secondary hyperparathyroidism of renal origin: Secondary | ICD-10-CM | POA: Diagnosis not present

## 2020-06-27 DIAGNOSIS — N186 End stage renal disease: Secondary | ICD-10-CM | POA: Diagnosis not present

## 2020-06-27 DIAGNOSIS — D509 Iron deficiency anemia, unspecified: Secondary | ICD-10-CM | POA: Diagnosis not present

## 2020-06-29 DIAGNOSIS — N186 End stage renal disease: Secondary | ICD-10-CM | POA: Diagnosis not present

## 2020-06-29 DIAGNOSIS — Z992 Dependence on renal dialysis: Secondary | ICD-10-CM | POA: Diagnosis not present

## 2020-06-29 DIAGNOSIS — D631 Anemia in chronic kidney disease: Secondary | ICD-10-CM | POA: Diagnosis not present

## 2020-06-29 DIAGNOSIS — D509 Iron deficiency anemia, unspecified: Secondary | ICD-10-CM | POA: Diagnosis not present

## 2020-06-29 DIAGNOSIS — N2581 Secondary hyperparathyroidism of renal origin: Secondary | ICD-10-CM | POA: Diagnosis not present

## 2020-07-06 DIAGNOSIS — Z992 Dependence on renal dialysis: Secondary | ICD-10-CM | POA: Diagnosis not present

## 2020-07-06 DIAGNOSIS — D509 Iron deficiency anemia, unspecified: Secondary | ICD-10-CM | POA: Diagnosis not present

## 2020-07-06 DIAGNOSIS — N186 End stage renal disease: Secondary | ICD-10-CM | POA: Diagnosis not present

## 2020-07-06 DIAGNOSIS — N2581 Secondary hyperparathyroidism of renal origin: Secondary | ICD-10-CM | POA: Diagnosis not present

## 2020-07-06 DIAGNOSIS — D631 Anemia in chronic kidney disease: Secondary | ICD-10-CM | POA: Diagnosis not present

## 2020-07-12 DIAGNOSIS — I10 Essential (primary) hypertension: Secondary | ICD-10-CM | POA: Diagnosis not present

## 2020-07-12 DIAGNOSIS — Z992 Dependence on renal dialysis: Secondary | ICD-10-CM | POA: Diagnosis not present

## 2020-07-12 DIAGNOSIS — N186 End stage renal disease: Secondary | ICD-10-CM | POA: Diagnosis not present

## 2020-07-15 DIAGNOSIS — D631 Anemia in chronic kidney disease: Secondary | ICD-10-CM | POA: Diagnosis not present

## 2020-07-15 DIAGNOSIS — D509 Iron deficiency anemia, unspecified: Secondary | ICD-10-CM | POA: Diagnosis not present

## 2020-07-15 DIAGNOSIS — Z79899 Other long term (current) drug therapy: Secondary | ICD-10-CM | POA: Diagnosis not present

## 2020-07-15 DIAGNOSIS — Z992 Dependence on renal dialysis: Secondary | ICD-10-CM | POA: Diagnosis not present

## 2020-07-15 DIAGNOSIS — N186 End stage renal disease: Secondary | ICD-10-CM | POA: Diagnosis not present

## 2020-07-15 DIAGNOSIS — N2581 Secondary hyperparathyroidism of renal origin: Secondary | ICD-10-CM | POA: Diagnosis not present

## 2020-07-15 DIAGNOSIS — Z23 Encounter for immunization: Secondary | ICD-10-CM | POA: Diagnosis not present

## 2020-07-20 DIAGNOSIS — D509 Iron deficiency anemia, unspecified: Secondary | ICD-10-CM | POA: Diagnosis not present

## 2020-07-20 DIAGNOSIS — Z23 Encounter for immunization: Secondary | ICD-10-CM | POA: Diagnosis not present

## 2020-07-20 DIAGNOSIS — D631 Anemia in chronic kidney disease: Secondary | ICD-10-CM | POA: Diagnosis not present

## 2020-07-20 DIAGNOSIS — N2581 Secondary hyperparathyroidism of renal origin: Secondary | ICD-10-CM | POA: Diagnosis not present

## 2020-07-20 DIAGNOSIS — N186 End stage renal disease: Secondary | ICD-10-CM | POA: Diagnosis not present

## 2020-07-20 DIAGNOSIS — Z992 Dependence on renal dialysis: Secondary | ICD-10-CM | POA: Diagnosis not present

## 2020-07-25 DIAGNOSIS — N184 Chronic kidney disease, stage 4 (severe): Secondary | ICD-10-CM | POA: Diagnosis not present

## 2020-07-25 DIAGNOSIS — Z6830 Body mass index (BMI) 30.0-30.9, adult: Secondary | ICD-10-CM | POA: Diagnosis not present

## 2020-07-25 DIAGNOSIS — D509 Iron deficiency anemia, unspecified: Secondary | ICD-10-CM | POA: Diagnosis not present

## 2020-07-25 DIAGNOSIS — N2581 Secondary hyperparathyroidism of renal origin: Secondary | ICD-10-CM | POA: Diagnosis not present

## 2020-07-25 DIAGNOSIS — I10 Essential (primary) hypertension: Secondary | ICD-10-CM | POA: Diagnosis not present

## 2020-07-25 DIAGNOSIS — Z992 Dependence on renal dialysis: Secondary | ICD-10-CM | POA: Diagnosis not present

## 2020-07-25 DIAGNOSIS — N186 End stage renal disease: Secondary | ICD-10-CM | POA: Diagnosis not present

## 2020-07-25 DIAGNOSIS — Z23 Encounter for immunization: Secondary | ICD-10-CM | POA: Diagnosis not present

## 2020-07-25 DIAGNOSIS — E109 Type 1 diabetes mellitus without complications: Secondary | ICD-10-CM | POA: Diagnosis not present

## 2020-07-25 DIAGNOSIS — Z299 Encounter for prophylactic measures, unspecified: Secondary | ICD-10-CM | POA: Diagnosis not present

## 2020-07-25 DIAGNOSIS — D631 Anemia in chronic kidney disease: Secondary | ICD-10-CM | POA: Diagnosis not present

## 2020-07-29 DIAGNOSIS — Z992 Dependence on renal dialysis: Secondary | ICD-10-CM | POA: Diagnosis not present

## 2020-07-29 DIAGNOSIS — Z23 Encounter for immunization: Secondary | ICD-10-CM | POA: Diagnosis not present

## 2020-07-29 DIAGNOSIS — D509 Iron deficiency anemia, unspecified: Secondary | ICD-10-CM | POA: Diagnosis not present

## 2020-07-29 DIAGNOSIS — N2581 Secondary hyperparathyroidism of renal origin: Secondary | ICD-10-CM | POA: Diagnosis not present

## 2020-07-29 DIAGNOSIS — D631 Anemia in chronic kidney disease: Secondary | ICD-10-CM | POA: Diagnosis not present

## 2020-07-29 DIAGNOSIS — N186 End stage renal disease: Secondary | ICD-10-CM | POA: Diagnosis not present

## 2020-08-01 DIAGNOSIS — Z992 Dependence on renal dialysis: Secondary | ICD-10-CM | POA: Diagnosis not present

## 2020-08-01 DIAGNOSIS — D631 Anemia in chronic kidney disease: Secondary | ICD-10-CM | POA: Diagnosis not present

## 2020-08-01 DIAGNOSIS — N186 End stage renal disease: Secondary | ICD-10-CM | POA: Diagnosis not present

## 2020-08-01 DIAGNOSIS — Z23 Encounter for immunization: Secondary | ICD-10-CM | POA: Diagnosis not present

## 2020-08-01 DIAGNOSIS — N2581 Secondary hyperparathyroidism of renal origin: Secondary | ICD-10-CM | POA: Diagnosis not present

## 2020-08-01 DIAGNOSIS — D509 Iron deficiency anemia, unspecified: Secondary | ICD-10-CM | POA: Diagnosis not present

## 2020-08-08 DIAGNOSIS — Z992 Dependence on renal dialysis: Secondary | ICD-10-CM | POA: Diagnosis not present

## 2020-08-08 DIAGNOSIS — D631 Anemia in chronic kidney disease: Secondary | ICD-10-CM | POA: Diagnosis not present

## 2020-08-08 DIAGNOSIS — N2581 Secondary hyperparathyroidism of renal origin: Secondary | ICD-10-CM | POA: Diagnosis not present

## 2020-08-08 DIAGNOSIS — Z23 Encounter for immunization: Secondary | ICD-10-CM | POA: Diagnosis not present

## 2020-08-08 DIAGNOSIS — D509 Iron deficiency anemia, unspecified: Secondary | ICD-10-CM | POA: Diagnosis not present

## 2020-08-08 DIAGNOSIS — N186 End stage renal disease: Secondary | ICD-10-CM | POA: Diagnosis not present

## 2020-08-09 ENCOUNTER — Ambulatory Visit (INDEPENDENT_AMBULATORY_CARE_PROVIDER_SITE_OTHER): Payer: Medicare Other | Admitting: "Endocrinology

## 2020-08-09 ENCOUNTER — Encounter: Payer: Self-pay | Admitting: "Endocrinology

## 2020-08-09 ENCOUNTER — Other Ambulatory Visit: Payer: Self-pay

## 2020-08-09 ENCOUNTER — Other Ambulatory Visit: Payer: Self-pay | Admitting: "Endocrinology

## 2020-08-09 VITALS — BP 150/44 | HR 72 | Ht 67.0 in | Wt 194.4 lb

## 2020-08-09 DIAGNOSIS — E1122 Type 2 diabetes mellitus with diabetic chronic kidney disease: Secondary | ICD-10-CM | POA: Diagnosis not present

## 2020-08-09 DIAGNOSIS — N186 End stage renal disease: Secondary | ICD-10-CM | POA: Diagnosis not present

## 2020-08-09 MED ORDER — FREESTYLE LIBRE 2 READER DEVI
0 refills | Status: DC
Start: 2020-08-09 — End: 2020-09-06

## 2020-08-09 MED ORDER — LANTUS SOLOSTAR 100 UNIT/ML ~~LOC~~ SOPN: 40.0000 [IU] | PEN_INJECTOR | Freq: Every day | SUBCUTANEOUS | 1 refills | Status: AC

## 2020-08-09 MED ORDER — NOVOLOG FLEXPEN 100 UNIT/ML ~~LOC~~ SOPN: 8.0000 [IU] | PEN_INJECTOR | Freq: Three times a day (TID) | SUBCUTANEOUS | 1 refills | Status: AC

## 2020-08-09 MED ORDER — FREESTYLE LIBRE 2 SENSOR MISC: 1.0000 | 3 refills | Status: DC

## 2020-08-09 MED ORDER — BD PEN NEEDLE SHORT U/F 31G X 8 MM MISC: 1.0000 | 3 refills | Status: DC

## 2020-08-09 NOTE — Progress Notes (Signed)
Endocrinology Consult Note       08/09/2020, 2:53 PM   Subjective:    Patient ID: Alejandro Lee, male    DOB: [birth year].  Alejandro Lee is being seen in consultation for management of currently uncontrolled symptomatic diabetes requested by  Monico Blitz, MD.   Past Medical History:  Diagnosis Date  . Anemia    1 blood transfusion  . Arthritis   . Back pain   . CHF (congestive heart failure) ([place])   . Chronic kidney disease    dialysis M/W/F  . Diabetes mellitus without complication ([place])   . DVT (deep venous thrombosis) ([place])   . GERD (gastroesophageal reflux disease)   . Headache    migraines  . History of cardiovascular stress test 11/2017   [facility]) negative dobutamine stress test  . Hyperlipidemia   . Hypertension   . Legally blind    right  . Myocardial infarction [facility])    patient states it was seen on EKG, denies a cardiac cath    Past Surgical History:  Procedure Laterality Date  . A/V FISTULAGRAM Right 07/22/2018   Procedure: A/V FISTULAGRAM;  Surgeon: Serafina Mitchell, MD;  Location: [place] [facility];  Service: Cardiovascular;  Laterality: Right;  . AV FISTULA PLACEMENT Right   . AV FISTULA PLACEMENT Right 11/01/2246   Procedure: PLICATION OF RIGHT arm FISTULA;  Surgeon: Waynetta Sandy, MD;  Location: [place];  Service: Vascular;  Laterality: Right;  . BASCILIC VEIN TRANSPOSITION Left 02/05/2019   Procedure: BRACHIOCEPHALIC FISTULA  LEFT ARM;  Surgeon: Serafina Mitchell, MD;  Location: [facility];  Service: Vascular;  Laterality: Left;  . COLONOSCOPY    . diabetic cyst removal Bilateral    on buttocks x8  . EYE SURGERY    . FISTULA SUPERFICIALIZATION Right 2/50/0370   Procedure: PLICATION OF ARTERIOVENOUS FISTULA RIGHT ARM;  Surgeon: Conrad McAlmont, MD;  Location: [place];  Service: Vascular;  Laterality: Right;  . 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  . lens replacement Left    . LIGATION OF ARTERIOVENOUS  FISTULA  03/19/2019   Procedure: LIGATION OF ARTERIOVENOUS  OF COMPETING BRANCHES OF FISTULA LEFT UPPER ARM;  Surgeon: Serafina Mitchell, MD;  Location: [facility];  Service: Vascular;;  . LIGATION OF ARTERIOVENOUS  FISTULA Right 05/07/2019   Procedure: LIGATION OF ARTERIOVENOUS  FISTULA RIGHT ARM;  Surgeon: Serafina Mitchell, MD;  Location: [place];  Service: Vascular;  Laterality: Right;  . NEPHRECTOMY TRANSPLANTED ORGAN    . PERIPHERAL VASCULAR BALLOON ANGIOPLASTY  07/22/2018   Procedure: PERIPHERAL VASCULAR BALLOON ANGIOPLASTY;  Surgeon: Serafina Mitchell, MD;  Location: [place] [facility];  Service: Cardiovascular;;  Right [place] fistula   . PERIPHERAL VASCULAR BALLOON ANGIOPLASTY Right 02/03/2019   Procedure: PERIPHERAL VASCULAR BALLOON ANGIOPLASTY;  Surgeon: Serafina Mitchell, MD;  Location: [place] [facility];  Service: Cardiovascular;  Laterality: Right;  RUE AVF  . REFRACTIVE SURGERY Bilateral     Social History   Socioeconomic History  . Marital status: Married    Spouse name: Not on file  . Number of children: Not  on file  . Years of education: Not on file  . Highest education level: Not on file  Occupational History  . Not on file  Tobacco Use  . Smoking status: Former Smoker    Types: Cigarettes  . Smokeless tobacco: Never Used  Vaping Use  . Vaping Use: Never used  Substance and Sexual Activity  . Alcohol use: Not Currently  . Drug use: Never  . Sexual activity: Yes  Other Topics Concern  . Not on file  Social History Narrative  . Not on file   Social Determinants of Health   Financial Resource Strain: Not on file  Food Insecurity: Not on file  Transportation Needs: Not on file  Physical Activity: Not on file  Stress: Not on file  Social Connections: Not on file    Family History  Problem Relation Age of Onset  . Diabetes Mother   . Hypertension Mother   . Hyperlipidemia Mother   . Diabetes Father   . Heart disease Father   . Cancer  Father   . Hypertension Father   . Hyperlipidemia Father   . Stroke Father   . Diabetes Sister   . Hypertension Sister   . Diabetes Brother   . Hypertension Brother   . Cancer Brother     Outpatient Encounter Medications as of 08/09/2020  Medication Sig  . Continuous Blood Gluc Receiver (FREESTYLE LIBRE 2 READER) DEVI As directed  . Continuous Blood Gluc Sensor (FREESTYLE LIBRE 2 SENSOR) MISC 1 Piece by Does not apply route every 14 (fourteen) days.  . Insulin Pen Needle (B-D ULTRAFINE III SHORT PEN) 31G X 8 MM MISC 1 each by Does not apply route as directed.  . Tacrolimus ER 4 MG TB24 Take 16 mg by mouth daily in the afternoon.  Marland Kitchen acetaminophen (TYLENOL) 500 MG tablet Take 1,000 mg by mouth every 6 (six) hours as needed for moderate pain.  Marland Kitchen albuterol (VENTOLIN HFA) 108 (90 Base) MCG/ACT inhaler Inhale 2 puffs into the lungs every 6 (six) hours as needed for wheezing or shortness of breath.  Marland Kitchen amLODipine (NORVASC) 10 MG tablet Take 10 mg by mouth daily.  Marland Kitchen azaTHIOprine (IMURAN) 50 MG tablet Take 200 mg by mouth in the morning.  . ciprofloxacin (CIPRO) 500 MG tablet Take 500 mg by mouth daily.  Marland Kitchen labetalol (NORMODYNE) 200 MG tablet Take 200 mg by mouth 3 (three) times daily.   Marland Kitchen LANTUS SOLOSTAR 100 UNIT/ML Solostar Pen Inject 40 Units into the skin at bedtime.  Marland Kitchen NOVOLOG FLEXPEN 100 UNIT/ML FlexPen Inject 8-14 Units into the skin 3 (three) times daily before meals.  . pantoprazole (PROTONIX) 40 MG tablet Take 40 mg by mouth daily.  Marland Kitchen sulfamethoxazole-trimethoprim (BACTRIM) 400-80 MG tablet Take 1 tablet by mouth every Monday, Wednesday, and Friday.  . [DISCONTINUED] ENVARSUS XR 1 MG TB24 Take 2 mg by mouth in the morning.   . [DISCONTINUED] hydrochlorothiazide (HYDRODIURIL) 25 MG tablet Take 25 mg by mouth daily.  . [DISCONTINUED] LANTUS SOLOSTAR 100 UNIT/ML Solostar Pen Inject 40 Units into the skin at bedtime.  . [DISCONTINUED] NOVOLOG FLEXPEN 100 UNIT/ML FlexPen Inject 8-14 Units  into the skin 3 (three) times daily before meals.  . [DISCONTINUED] sodium bicarbonate 650 MG tablet Take 650 mg by mouth 3 (three) times daily.  . [DISCONTINUED] Tacrolimus ER (ENVARSUS XR) 4 MG TB24 Take 16 mg by mouth in the morning.  . [DISCONTINUED] valGANciclovir (VALCYTE) 450 MG tablet Take 450 mg by mouth every Monday, Wednesday, and  Friday.   No facility-administered encounter medications on file as of 08/09/2020.    ALLERGIES: No Known Allergies  VACCINATION STATUS: Immunization History  Administered Date(s) Administered  . Tdap 12/19/2017    Diabetes He presents for his initial diabetic visit. Diabetes type: Patient reports being diagnosed at approximate age of 5 years. Onset time: Diagnosed at approximate age of 37 years. His disease course has been fluctuating. There are no hypoglycemic associated symptoms. Associated symptoms include blurred vision, foot paresthesias and foot ulcerations. There are no hypoglycemic complications. Symptoms are worsening. Diabetic complications include heart disease, nephropathy, peripheral neuropathy, PVD and retinopathy. Risk factors for coronary artery disease include diabetes mellitus, male sex, sedentary lifestyle and tobacco exposure. Current diabetic treatment includes insulin injections. His weight is fluctuating minimally. He is following a generally unhealthy diet. When asked about meal planning, he reported none. He has not had a previous visit with a dietitian. He never participates in exercise. (He did not bring any logs nor meter to review.  He has had Libre device and sensors, however he recently misplaced his device while moving.  He has 2 sensors left.  His recent A1c was 9.3%.)     Review of Systems  Eyes: Positive for blurred vision.    Objective:    Vitals with BMI 08/09/2020 03/01/2020 03/01/2020  Height 5\' 7"  - -  Weight 194 lbs 6 oz - -  BMI 41.66 - -  Systolic 063 016 010  Diastolic 44 60 62  Pulse 72 71 71    BP  (!) 150/44   Pulse 72   Ht 5\' 7"  (1.702 m)   Wt 194 lb 6.4 oz (88.2 kg)   BMI 30.45 kg/m   Wt Readings from Last 3 Encounters:  08/09/20 194 lb 6.4 oz (88.2 kg)  03/01/20 203 lb 0.7 oz (92.1 kg)  08/08/19 190 lb (86.2 kg)     Physical Exam Constitutional:      General: He is not in acute distress.    Appearance: He is well-developed and well-nourished.  HENT:     Head: Normocephalic and atraumatic.  Eyes:     Extraocular Movements: EOM normal.  Neck:     Thyroid: No thyromegaly.     Trachea: No tracheal deviation.  Cardiovascular:     Rate and Rhythm: Normal rate.     Pulses:          Dorsalis pedis pulses are 1+ on the right side and 1+ on the left side.       Posterior tibial pulses are 1+ on the right side and 1+ on the left side.     Heart sounds: S1 normal and S2 normal. No murmur heard. No gallop.   Pulmonary:     Effort: Pulmonary effort is normal. No respiratory distress.     Breath sounds: No wheezing.  Abdominal:     General: There is no distension.     Tenderness: There is no abdominal tenderness. There is no CVA tenderness or guarding.  Musculoskeletal:        General: No edema.     Right shoulder: No swelling or deformity.     Cervical back: Normal range of motion and neck supple.  Skin:    General: Skin is dry.     Findings: No rash.     Nails: There is no clubbing or cyanosis.     Comments: Skin is dry on bilateral lower extremities.  + Long nails, diminished peripheral arterial pulses.  Neurological:  Mental Status: He is alert and oriented to person, place, and time.     Cranial Nerves: No cranial nerve deficit.     Sensory: No sensory deficit.     Gait: Gait normal.     Deep Tendon Reflexes: Strength normal and reflexes are normal and symmetric.  Psychiatric:        Mood and Affect: Mood and affect normal.        Speech: Speech normal.        Behavior: Behavior is cooperative.        Cognition and Memory: Cognition and memory normal.      Comments: Hesitant behavior.     CMP ( most recent) CMP     Component Value Date/Time   NA 135 08/08/2019 1623   K 4.1 08/08/2019 1623   CL 113 (H) 08/08/2019 1623   CO2 12 (L) 08/08/2019 1623   GLUCOSE 110 (H) 08/08/2019 1623   BUN 57 (H) 08/08/2019 1623   CREATININE 5.01 (H) 08/08/2019 1623   CALCIUM 9.1 08/08/2019 1623   PROT 6.8 08/08/2019 1623   ALBUMIN 3.2 (L) 08/08/2019 1623   AST 15 08/08/2019 1623   ALT 13 08/08/2019 1623   ALKPHOS 127 (H) 08/08/2019 1623   BILITOT 0.6 08/08/2019 1623   GFRNONAA 12 (L) 08/08/2019 1623   GFRAA 14 (L) 08/08/2019 1623     Diabetic Labs (most recent): Lab Results  Component Value Date   HGBA1C 9.3 05/27/2020   HGBA1C 7.1 (H) 12/19/2017     Lipid Panel ( most recent) Lipid Panel     Component Value Date/Time   CHOL 161 12/19/2017 1207   TRIG 104 12/19/2017 1207   HDL 37 (L) 12/19/2017 1207   CHOLHDL 4.4 12/19/2017 1207   LDLCALC 104 (H) 12/19/2017 1207      Assessment & Plan:   1. Diabetes mellitus with end-stage renal disease ([place])  - Nikki A Montero has currently uncontrolled symptomatic type ? DM since  59 years of age,  with most recent A1c of 9.3 %. Recent labs reviewed.  He did not bring any logs nor meter to review.  He has had Libre device and sensors, however he recently misplaced his device while moving.  He has 2 sensors left.  His recent A1c was 9.3%.  - I had a long discussion with him about the progressive nature of diabetes and the pathology behind its complications. -his diabetes is complicated by ESRD/kidney transplant in November 2020 after several years of dialysis, peripheral artery disease, CHF, peripheral neuropathy and he remains at a high risk for more acute and chronic complications which include CAD, CVA, retinopathy, and neuropathy.  His recent labs are consistent with declining function of the transplanted kidney as well.   These are all discussed in detail with him.  - I have counseled him on diet   and weight management  by adopting a carbohydrate restricted/protein rich diet. Patient is encouraged to switch to  unprocessed or minimally processed     complex starch and increased protein intake (animal or plant source), fruits, and vegetables. -  he is advised to stick to a routine mealtimes to eat 3 meals  a day and avoid unnecessary snacks ( to snack only to correct hypoglycemia).   - he acknowledges that there is a room for improvement in his food and drink choices. - Suggestion is made for him to avoid simple carbohydrates  from his diet including Cakes, Sweet Desserts, Ice Cream, Soda (diet and regular),  Sweet Tea, Candies, Chips, Cookies, Store Bought Juices, Alcohol in Excess of  1-2 drinks a day, Artificial Sweeteners,  Coffee Creamer, and "Sugar-free" Products. This will help patient to have more stable blood glucose profile and potentially avoid unintended weight gain.  - he will be scheduled with Jearld Fenton, RDN, CDE for diabetes education.  - I have approached him with the following individualized plan to manage  his diabetes and patient agrees:   -He remains on prednisone 10 mg p.o. daily.  In light of his presentation with chronic hyperglycemia, he will need intensive treatment with basal/bolus insulin in order for him to achieve and maintain control of diabetes to target.   -Patient displays a hesitant behavior, his wife is offering help.  -He is approached to start monitoring blood glucose at least 4 times a day using his CGM-and resume Lantus 40 units nightly, and prandial insulin NovoLog 8 units 3 times a day with meals  for pre-meal BG readings of 90-150mg /dl, plus patient specific correction dose for unexpected hyperglycemia above 150mg /dl, associated with strict monitoring of glucose 4 times a day-before meals and at bedtime.  He will be treated exclusively with insulin for now.  I have refilled all of his supplies including freestyle libre device, Lantus, NovoLog, and  insulin pen needles.  - he is warned not to take insulin without proper monitoring per orders.  - he is encouraged to call clinic for blood glucose levels less than 70 or above 300 mg /dl.  - he will be considered for incretin therapy as appropriate next visit.  - Specific targets for  A1c;  LDL, HDL,  and Triglycerides were discussed with the patient.  2) Blood Pressure /Hypertension:  his blood pressure is not controlled to target.   he is advised to continue his current medications including amlodipine 10 mg p.o. daily, labetalol 200 mg p.o. 3 times daily.  3) Lipids/Hyperlipidemia:   Review of his recent lipid panel showed un controlled  LDL at 106 .  he  Is not on statins, will be considered for low-dose statin next visit    4)  Weight/Diet:  Body mass index is 30.45 kg/m.  -   clearly complicating his diabetes care.   he is  a candidate for weight loss. I discussed with him the fact that loss of 5 - 10% of his  current body weight will have the most impact on his diabetes management.  Exercise, and detailed carbohydrates information provided  -  detailed on discharge instructions.  5) Chronic Care/Health Maintenance:  -he  Is not  on ACEI/ARB and Statin medications and  is encouraged to initiate and continue to follow up with Ophthalmology, Dentist,  Podiatrist at least yearly or according to recommendations, and advised to   stay away from smoking. I have recommended yearly flu vaccine and pneumonia vaccine at least every 5 years; moderate intensity exercise for up to 150 minutes weekly; and  sleep for at least 7 hours a day.  This patient will need podiatric care.  [facility]  - he is  advised to maintain close follow up with Monico Blitz, MD for primary care needs, as well as his other providers for optimal and coordinated care.   - Time spent in this patient care: 60 min, of which > 50% was spent in  counseling  him about his chronically  uncontrolled , complicated diabetes; and the rest reviewing his blood glucose logs , discussing  his hypoglycemia and hyperglycemia episodes, reviewing his current and  previous labs / studies  ( including abstraction from other facilities) and medications  doses and developing a  long term treatment plan based on the latest standards of care/ guidelines; and documenting his care.    Please refer to Patient Instructions for Blood Glucose Monitoring and Insulin/Medications Dosing Guide"  in media tab for additional information. Please  also refer to " Patient Self Inventory" in the Media  tab for reviewed elements of pertinent patient history.  Alejandro Lee participated in the discussions, expressed understanding, and voiced agreement with the above plans.  All questions were answered to his satisfaction. he is encouraged to contact clinic should he have any questions or concerns prior to his return visit.   Follow up plan: - Return in about 2 weeks (around 08/23/2020) for Bring Meter and Logs- A1c in Office.  Glade Lloyd, MD [facility] [address] [place], [place] [address] Phone: [phone]  Fax: [phone]    08/09/2020, 2:53 PM  This note was partially dictated with voice recognition software. Similar sounding words can be transcribed inadequately or may not  be corrected upon review.

## 2020-08-09 NOTE — Patient Instructions (Signed)

## 2020-08-11 DIAGNOSIS — I10 Essential (primary) hypertension: Secondary | ICD-10-CM | POA: Diagnosis not present

## 2020-08-12 DIAGNOSIS — D631 Anemia in chronic kidney disease: Secondary | ICD-10-CM | POA: Diagnosis not present

## 2020-08-12 DIAGNOSIS — N2581 Secondary hyperparathyroidism of renal origin: Secondary | ICD-10-CM | POA: Diagnosis not present

## 2020-08-12 DIAGNOSIS — D509 Iron deficiency anemia, unspecified: Secondary | ICD-10-CM | POA: Diagnosis not present

## 2020-08-12 DIAGNOSIS — Z23 Encounter for immunization: Secondary | ICD-10-CM | POA: Diagnosis not present

## 2020-08-12 DIAGNOSIS — N186 End stage renal disease: Secondary | ICD-10-CM | POA: Diagnosis not present

## 2020-08-12 DIAGNOSIS — Z992 Dependence on renal dialysis: Secondary | ICD-10-CM | POA: Diagnosis not present

## 2020-08-19 DIAGNOSIS — D509 Iron deficiency anemia, unspecified: Secondary | ICD-10-CM | POA: Diagnosis not present

## 2020-08-19 DIAGNOSIS — N2581 Secondary hyperparathyroidism of renal origin: Secondary | ICD-10-CM | POA: Diagnosis not present

## 2020-08-19 DIAGNOSIS — Z23 Encounter for immunization: Secondary | ICD-10-CM | POA: Diagnosis not present

## 2020-08-19 DIAGNOSIS — N186 End stage renal disease: Secondary | ICD-10-CM | POA: Diagnosis not present

## 2020-08-19 DIAGNOSIS — D631 Anemia in chronic kidney disease: Secondary | ICD-10-CM | POA: Diagnosis not present

## 2020-08-19 DIAGNOSIS — Z992 Dependence on renal dialysis: Secondary | ICD-10-CM | POA: Diagnosis not present

## 2020-08-22 DIAGNOSIS — E119 Type 2 diabetes mellitus without complications: Secondary | ICD-10-CM | POA: Diagnosis not present

## 2020-08-22 DIAGNOSIS — Z992 Dependence on renal dialysis: Secondary | ICD-10-CM | POA: Diagnosis not present

## 2020-08-22 DIAGNOSIS — Z794 Long term (current) use of insulin: Secondary | ICD-10-CM | POA: Diagnosis not present

## 2020-08-23 ENCOUNTER — Ambulatory Visit: Payer: Medicare Other | Admitting: "Endocrinology

## 2020-08-30 ENCOUNTER — Telehealth: Payer: Medicare Other | Admitting: "Endocrinology

## 2020-09-05 ENCOUNTER — Inpatient Hospital Stay (HOSPITAL_COMMUNITY)
Admission: EM | Admit: 2020-09-05 | Discharge: 2020-09-07 | DRG: 177 | Disposition: A | Discharge status: Home / Self Care | Payer: Medicare Other | Attending: Family Medicine | Admitting: Family Medicine

## 2020-09-05 ENCOUNTER — Emergency Department (HOSPITAL_COMMUNITY): Payer: Medicare Other

## 2020-09-05 DIAGNOSIS — Z8249 Family history of ischemic heart disease and other diseases of the circulatory system: Secondary | ICD-10-CM

## 2020-09-05 DIAGNOSIS — I252 Old myocardial infarction: Secondary | ICD-10-CM

## 2020-09-05 DIAGNOSIS — D631 Anemia in chronic kidney disease: Secondary | ICD-10-CM | POA: Diagnosis present

## 2020-09-05 DIAGNOSIS — Z794 Long term (current) use of insulin: Secondary | ICD-10-CM | POA: Diagnosis not present

## 2020-09-05 DIAGNOSIS — I10 Essential (primary) hypertension: Secondary | ICD-10-CM | POA: Diagnosis present

## 2020-09-05 DIAGNOSIS — R1110 Vomiting, unspecified: Secondary | ICD-10-CM | POA: Diagnosis not present

## 2020-09-05 DIAGNOSIS — Z299 Encounter for prophylactic measures, unspecified: Secondary | ICD-10-CM | POA: Diagnosis not present

## 2020-09-05 DIAGNOSIS — I2510 Atherosclerotic heart disease of native coronary artery without angina pectoris: Secondary | ICD-10-CM | POA: Diagnosis present

## 2020-09-05 DIAGNOSIS — E8889 Other specified metabolic disorders: Secondary | ICD-10-CM | POA: Diagnosis present

## 2020-09-05 DIAGNOSIS — T380X5A Adverse effect of glucocorticoids and synthetic analogues, initial encounter: Secondary | ICD-10-CM | POA: Diagnosis not present

## 2020-09-05 DIAGNOSIS — Y830 Surgical operation with transplant of whole organ as the cause of abnormal reaction of the patient, or of later complication, without mention of misadventure at the time of the procedure: Secondary | ICD-10-CM | POA: Diagnosis present

## 2020-09-05 DIAGNOSIS — T8612 Kidney transplant failure: Secondary | ICD-10-CM | POA: Diagnosis not present

## 2020-09-05 DIAGNOSIS — I132 Hypertensive heart and chronic kidney disease with heart failure and with stage 5 chronic kidney disease, or end stage renal disease: Secondary | ICD-10-CM | POA: Diagnosis present

## 2020-09-05 DIAGNOSIS — Z992 Dependence on renal dialysis: Secondary | ICD-10-CM

## 2020-09-05 DIAGNOSIS — E1122 Type 2 diabetes mellitus with diabetic chronic kidney disease: Secondary | ICD-10-CM | POA: Diagnosis present

## 2020-09-05 DIAGNOSIS — E785 Hyperlipidemia, unspecified: Secondary | ICD-10-CM | POA: Diagnosis not present

## 2020-09-05 DIAGNOSIS — I5032 Chronic diastolic (congestive) heart failure: Secondary | ICD-10-CM | POA: Diagnosis not present

## 2020-09-05 DIAGNOSIS — N186 End stage renal disease: Secondary | ICD-10-CM | POA: Diagnosis not present

## 2020-09-05 DIAGNOSIS — R197 Diarrhea, unspecified: Secondary | ICD-10-CM | POA: Diagnosis not present

## 2020-09-05 DIAGNOSIS — Z86718 Personal history of other venous thrombosis and embolism: Secondary | ICD-10-CM | POA: Diagnosis not present

## 2020-09-05 DIAGNOSIS — U071 COVID-19: Principal | ICD-10-CM

## 2020-09-05 DIAGNOSIS — J1282 Pneumonia due to coronavirus disease 2019: Secondary | ICD-10-CM | POA: Diagnosis not present

## 2020-09-05 DIAGNOSIS — H548 Legal blindness, as defined in USA: Secondary | ICD-10-CM | POA: Diagnosis present

## 2020-09-05 DIAGNOSIS — N2581 Secondary hyperparathyroidism of renal origin: Secondary | ICD-10-CM | POA: Diagnosis not present

## 2020-09-05 DIAGNOSIS — E1165 Type 2 diabetes mellitus with hyperglycemia: Secondary | ICD-10-CM | POA: Diagnosis present

## 2020-09-05 DIAGNOSIS — Z833 Family history of diabetes mellitus: Secondary | ICD-10-CM | POA: Diagnosis not present

## 2020-09-05 DIAGNOSIS — Z87891 Personal history of nicotine dependence: Secondary | ICD-10-CM | POA: Diagnosis not present

## 2020-09-05 DIAGNOSIS — Z79899 Other long term (current) drug therapy: Secondary | ICD-10-CM

## 2020-09-05 DIAGNOSIS — R531 Weakness: Secondary | ICD-10-CM | POA: Diagnosis not present

## 2020-09-05 DIAGNOSIS — R9431 Abnormal electrocardiogram [ECG] [EKG]: Secondary | ICD-10-CM | POA: Diagnosis not present

## 2020-09-05 DIAGNOSIS — R509 Fever, unspecified: Secondary | ICD-10-CM | POA: Diagnosis not present

## 2020-09-05 DIAGNOSIS — T8611 Kidney transplant rejection: Secondary | ICD-10-CM | POA: Diagnosis not present

## 2020-09-05 DIAGNOSIS — Z83438 Family history of other disorder of lipoprotein metabolism and other lipidemia: Secondary | ICD-10-CM

## 2020-09-05 DIAGNOSIS — R059 Cough, unspecified: Secondary | ICD-10-CM | POA: Diagnosis not present

## 2020-09-05 DIAGNOSIS — B9729 Other coronavirus as the cause of diseases classified elsewhere: Secondary | ICD-10-CM | POA: Diagnosis not present

## 2020-09-05 DIAGNOSIS — K219 Gastro-esophageal reflux disease without esophagitis: Secondary | ICD-10-CM | POA: Diagnosis present

## 2020-09-05 DIAGNOSIS — N184 Chronic kidney disease, stage 4 (severe): Secondary | ICD-10-CM | POA: Diagnosis not present

## 2020-09-05 DIAGNOSIS — J9601 Acute respiratory failure with hypoxia: Secondary | ICD-10-CM

## 2020-09-05 DIAGNOSIS — J189 Pneumonia, unspecified organism: Secondary | ICD-10-CM | POA: Diagnosis not present

## 2020-09-05 LAB — COMPREHENSIVE METABOLIC PANEL
ALT: 10 U/L (ref 0–44)
AST: 21 U/L (ref 15–41)
Albumin: 3.3 g/dL — ABNORMAL LOW (ref 3.5–5.0)
Alkaline Phosphatase: 93 U/L (ref 38–126)
Anion gap: 14 (ref 5–15)
BUN: 65 mg/dL — ABNORMAL HIGH (ref 6–20)
CO2: 18 mmol/L — ABNORMAL LOW (ref 22–32)
Calcium: 8.5 mg/dL — ABNORMAL LOW (ref 8.9–10.3)
Chloride: 104 mmol/L (ref 98–111)
Creatinine, Ser: 9.24 mg/dL — ABNORMAL HIGH (ref 0.61–1.24)
GFR, Estimated: 6 mL/min — ABNORMAL LOW (ref >60)
Glucose, Bld: 93 mg/dL (ref 70–99)
Potassium: 4.3 mmol/L (ref 3.5–5.1)
Sodium: 136 mmol/L (ref 135–145)
Total Bilirubin: 0.7 mg/dL (ref 0.3–1.2)
Total Protein: 7 g/dL (ref 6.5–8.1)

## 2020-09-05 LAB — D-DIMER, QUANTITATIVE: D-Dimer, Quant: 5.09 ug/mL-FEU — ABNORMAL HIGH (ref 0.00–0.50)

## 2020-09-05 LAB — CBC WITH DIFFERENTIAL/PLATELET
Abs Immature Granulocytes: 0.01 10*3/uL (ref 0.00–0.07)
Basophils Absolute: 0 10*3/uL (ref 0.0–0.1)
Basophils Relative: 1 %
Eosinophils Absolute: 0 10*3/uL (ref 0.0–0.5)
Eosinophils Relative: 1 %
HCT: 38.5 % — ABNORMAL LOW (ref 39.0–52.0)
Hemoglobin: 11.2 g/dL — ABNORMAL LOW (ref 13.0–17.0)
Immature Granulocytes: 1 %
Lymphocytes Relative: 19 %
Lymphs Abs: 0.3 10*3/uL — ABNORMAL LOW (ref 0.7–4.0)
MCH: 23.4 pg — ABNORMAL LOW (ref 26.0–34.0)
MCHC: 29.1 g/dL — ABNORMAL LOW (ref 30.0–36.0)
MCV: 80.4 fL (ref 80.0–100.0)
Monocytes Absolute: 0.1 10*3/uL (ref 0.1–1.0)
Monocytes Relative: 7 %
Neutro Abs: 1.1 10*3/uL — ABNORMAL LOW (ref 1.7–7.7)
Neutrophils Relative %: 71 %
Platelets: 114 10*3/uL — ABNORMAL LOW (ref 150–400)
RBC: 4.79 MIL/uL (ref 4.22–5.81)
RDW: 17.1 % — ABNORMAL HIGH (ref 11.5–15.5)
WBC: 1.5 10*3/uL — ABNORMAL LOW (ref 4.0–10.5)
nRBC: 0 % (ref 0.0–0.2)

## 2020-09-05 LAB — PROCALCITONIN: Procalcitonin: 4.24 ng/mL

## 2020-09-05 LAB — C-REACTIVE PROTEIN: CRP: 7.1 mg/dL — ABNORMAL HIGH (ref <1.0)

## 2020-09-05 LAB — LACTATE DEHYDROGENASE: LDH: 244 U/L — ABNORMAL HIGH (ref 98–192)

## 2020-09-05 LAB — LACTIC ACID, PLASMA: Lactic Acid, Venous: 1 mmol/L (ref 0.5–1.9)

## 2020-09-05 LAB — FERRITIN: Ferritin: 1515 ng/mL — ABNORMAL HIGH (ref 24–336)

## 2020-09-05 LAB — CBG MONITORING, ED: Glucose-Capillary: 147 mg/dL — ABNORMAL HIGH (ref 70–99)

## 2020-09-05 LAB — FIBRINOGEN: Fibrinogen: 614 mg/dL — ABNORMAL HIGH (ref 210–475)

## 2020-09-05 LAB — POC SARS CORONAVIRUS 2 AG -  ED: SARS Coronavirus 2 Ag: POSITIVE — AB

## 2020-09-05 LAB — TRIGLYCERIDES: Triglycerides: 250 mg/dL — ABNORMAL HIGH (ref <150)

## 2020-09-05 MED ORDER — DEXAMETHASONE SODIUM PHOSPHATE 10 MG/ML IJ SOLN
10.0000 mg | Freq: Once | INTRAMUSCULAR | Status: AC
  Administered 2020-09-05: 10 mg via INTRAVENOUS
  Filled 2020-09-05: qty 1

## 2020-09-05 MED ORDER — ZINC SULFATE 220 (50 ZN) MG PO CAPS
220.0000 mg | ORAL_CAPSULE | Freq: Every day | ORAL | Status: DC
  Administered 2020-09-06 – 2020-09-07 (×2): 220 mg via ORAL
  Filled 2020-09-05 (×2): qty 1

## 2020-09-05 MED ORDER — PROMETHAZINE HCL 12.5 MG PO TABS: 12.5000 mg | ORAL_TABLET | Freq: Four times a day (QID) | ORAL | Status: DC | PRN

## 2020-09-05 MED ORDER — ACETAMINOPHEN 325 MG PO TABS
650.0000 mg | ORAL_TABLET | Freq: Once | ORAL | Status: AC | PRN
  Administered 2020-09-05: 650 mg via ORAL
  Filled 2020-09-05 (×2): qty 2

## 2020-09-05 MED ORDER — REMDESIVIR 100 MG IV SOLR
100.0000 mg | INTRAVENOUS | Status: AC
Start: 2020-09-05 — End: 2020-09-06
  Administered 2020-09-05 (×2): 100 mg via INTRAVENOUS
  Filled 2020-09-05 (×2): qty 20

## 2020-09-05 MED ORDER — DEXAMETHASONE SODIUM PHOSPHATE 10 MG/ML IJ SOLN
6.0000 mg | INTRAMUSCULAR | Status: DC
  Administered 2020-09-06 – 2020-09-07 (×2): 6 mg via INTRAVENOUS
  Filled 2020-09-05 (×2): qty 1

## 2020-09-05 MED ORDER — POLYETHYLENE GLYCOL 3350 17 G PO PACK: 17.0000 g | PACK | Freq: Every day | ORAL | Status: DC | PRN

## 2020-09-05 MED ORDER — INSULIN ASPART 100 UNIT/ML ~~LOC~~ SOLN
0.0000 [IU] | Freq: Every day | SUBCUTANEOUS | Status: DC
  Administered 2020-09-06: 5 [IU] via SUBCUTANEOUS
  Filled 2020-09-05: qty 1

## 2020-09-05 MED ORDER — GUAIFENESIN-DM 100-10 MG/5ML PO SYRP: 10.0000 mL | ORAL_SOLUTION | ORAL | Status: DC | PRN

## 2020-09-05 MED ORDER — INSULIN GLARGINE 100 UNIT/ML ~~LOC~~ SOLN
30.0000 [IU] | Freq: Every day | SUBCUTANEOUS | Status: DC
  Administered 2020-09-06: 30 [IU] via SUBCUTANEOUS
  Filled 2020-09-05 (×2): qty 0.3

## 2020-09-05 MED ORDER — HEPARIN SODIUM (PORCINE) 5000 UNIT/ML IJ SOLN
5000.0000 [IU] | Freq: Three times a day (TID) | INTRAMUSCULAR | Status: DC
  Administered 2020-09-06 – 2020-09-07 (×4): 5000 [IU] via SUBCUTANEOUS
  Filled 2020-09-05 (×4): qty 1

## 2020-09-05 MED ORDER — SODIUM CHLORIDE 0.9 % IV SOLN
100.0000 mg | Freq: Every day | INTRAVENOUS | Status: DC
  Administered 2020-09-06 – 2020-09-07 (×2): 100 mg via INTRAVENOUS
  Filled 2020-09-05: qty 20

## 2020-09-05 MED ORDER — ACETAMINOPHEN 325 MG PO TABS: 650.0000 mg | ORAL_TABLET | Freq: Four times a day (QID) | ORAL | Status: DC | PRN

## 2020-09-05 MED ORDER — INSULIN ASPART 100 UNIT/ML ~~LOC~~ SOLN
0.0000 [IU] | Freq: Three times a day (TID) | SUBCUTANEOUS | Status: DC
  Administered 2020-09-06 (×3): 5 [IU] via SUBCUTANEOUS
  Administered 2020-09-07: 8 [IU] via SUBCUTANEOUS
  Administered 2020-09-07: 5 [IU] via SUBCUTANEOUS
  Filled 2020-09-05 (×4): qty 1

## 2020-09-05 MED ORDER — ALBUTEROL SULFATE HFA 108 (90 BASE) MCG/ACT IN AERS: 2.0000 | INHALATION_SPRAY | Freq: Four times a day (QID) | RESPIRATORY_TRACT | Status: DC | PRN

## 2020-09-05 MED ORDER — ASCORBIC ACID 500 MG PO TABS
500.0000 mg | ORAL_TABLET | Freq: Every day | ORAL | Status: DC
  Administered 2020-09-06 – 2020-09-07 (×2): 500 mg via ORAL
  Filled 2020-09-05 (×2): qty 1

## 2020-09-05 NOTE — ED Provider Notes (Signed)
Emergency Department Provider Note   I have reviewed the triage vital signs and the nursing notes.   HISTORY  Chief Complaint No chief complaint on file.   HPI Alejandro Lee is a 60 y.o. male with past medical history of renal transplant with subsequent developing CKD presents to the emergency department with nausea, vomiting, diarrhea along with cough and fever.  Symptoms been ongoing for the past 3 days.  Patient has had 2 doses of COVID-19 vaccine.  His shortness of breath develops mainly with coughing or with ambulation or talking for a [place] time.  He states he is relatively comfortable at rest.  He does not use oxygen at home.  He denies any associated chest pain or pressure.  No blood in the emesis or stool.  No focal or unilateral abdominal pain.  He has been compliant with his home medications. No radiation of symptoms or modifying factors.   Past Medical History:  Diagnosis Date  . Anemia    1 blood transfusion  . Arthritis   . Back pain   . CHF (congestive heart failure) ([place])   . Chronic kidney disease    dialysis M/W/F  . Diabetes mellitus without complication ([place])   . DVT (deep venous thrombosis) ([place])   . GERD (gastroesophageal reflux disease)   . Headache    migraines  . History of cardiovascular stress test 11/2017   [facility]) negative dobutamine stress test  . Hyperlipidemia   . Hypertension   . Legally blind    right  . Myocardial infarction [facility])    patient states it was seen on EKG, denies a cardiac cath    Patient Active Problem List   Diagnosis Date Noted  . Pneumonia due to COVID-19 virus 09/05/2020  . ESRD on hemodialysis ([place])   . Hyperkalemia 06/25/2018  . Non-compliance with renal dialysis ([place]) 06/25/2018  . Generalized abdominal pain 06/25/2018  . Chronic back pain 06/25/2018  . GERD (gastroesophageal reflux disease) 06/25/2018  . Chronic diastolic HF (heart failure) ([place]) 06/25/2018  . Class 1 obesity due to excess calories in adult  06/25/2018  . Benign essential HTN 06/25/2018  . 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[place]) 12/25/2017  . Diabetes mellitus with end-stage renal disease ([place]) 12/19/2017  . ESRD (end stage renal disease) on dialysis ([place]) 12/19/2017  . Chronic right-sided low back pain without sciatica 12/19/2017  . Pseudophakia of left eye 09/03/2017  . PDR (proliferative diabetic retinopathy) ([place]) 09/03/2017  . Cataract in degenerative disorder 09/03/2017  . Unspecified complication of internal prosthetic device, implant and graft, initial encounter 05/17/2017  . Rectal abscess 05/17/2017  . 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 Metabolic disorder [id number]  . Hypertension 05/16/2017  . Dependence on renal dialysis ([place]) 05/16/2017  . Anemia of chronic renal failure, stage 5 (HCC) 05/16/2017  . Retinal detachment, tractional, both eyes 06/19/2012    Past Surgical History:  Procedure Laterality Date  . A/V FISTULAGRAM Right 07/22/2018   Procedure: A/V FISTULAGRAM;  Surgeon: Serafina Mitchell, MD;  Location: [facility];  Service: Cardiovascular;  Laterality: Right;  . AV FISTULA PLACEMENT Right   . AV FISTULA PLACEMENT Right [id number]   Procedure: PLICATION OF RIGHT arm FISTULA;  Surgeon: Waynetta Sandy, MD;  Location: [facility];  Service: Vascular;  Laterality: Right;  . BASCILIC VEIN TRANSPOSITION Left 02/05/2019   Procedure: BRACHIOCEPHALIC FISTULA  LEFT ARM;  Surgeon: Serafina Mitchell, MD;  Location: [place];  Service: Vascular;  Laterality: Left;  . COLONOSCOPY    . diabetic cyst removal Bilateral    on buttocks x8  . EYE SURGERY    . FISTULA SUPERFICIALIZATION Right [id number]   Procedure: PLICATION OF ARTERIOVENOUS FISTULA RIGHT ARM;  Surgeon: Conrad [place], MD;  Location: [facility];  Service: Vascular;  Laterality: Right;  . 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  . lens replacement Left   . LIGATION OF ARTERIOVENOUS  FISTULA  03/19/2019    Procedure: LIGATION OF ARTERIOVENOUS  OF COMPETING BRANCHES OF FISTULA LEFT UPPER ARM;  Surgeon: Serafina Mitchell, MD;  [facility];  Service: Vascular;;  . LIGATION OF ARTERIOVENOUS  FISTULA Right 05/07/2019   Procedure: LIGATION OF ARTERIOVENOUS  FISTULA RIGHT ARM;  Surgeon: Serafina Mitchell, MD;  Location: [place];  Service: Vascular;  Laterality: Right;  . NEPHRECTOMY TRANSPLANTED ORGAN    . PERIPHERAL VASCULAR BALLOON ANGIOPLASTY  07/22/2018   Procedure: PERIPHERAL VASCULAR BALLOON ANGIOPLASTY;  Surgeon: Serafina Mitchell, MD;  Location: [place] [facility];  Service: Cardiovascular;;  Right [place] fistula   . PERIPHERAL VASCULAR BALLOON ANGIOPLASTY Right 02/03/2019   Procedure: PERIPHERAL VASCULAR BALLOON ANGIOPLASTY;  Surgeon: Serafina Mitchell, MD;  Location: [place] [facility];  Service: Cardiovascular;  Laterality: Right;  RUE AVF  . REFRACTIVE SURGERY Bilateral     Allergies Patient has no known allergies.  Family History  Problem Relation Age of Onset  . Diabetes Mother   . Hypertension Mother   . Hyperlipidemia Mother   . Diabetes Father   . Heart disease Father   . Cancer Father   . Hypertension Father   . Hyperlipidemia Father   . Stroke Father   . Diabetes Sister   . Hypertension Sister   . Diabetes Brother   . Hypertension Brother   . Cancer Brother     Social History Social History   Tobacco Use  . Smoking status: Former Smoker    Types: Cigarettes  . Smokeless tobacco: Never Used  Vaping Use  . Vaping Use: Never used  Substance Use Topics  . Alcohol use: Not Currently  . Drug use: Never    Review of Systems  Constitutional: Positive fever/chills Eyes: No visual changes. ENT: No sore throat. Cardiovascular: Denies chest pain. Respiratory: Positive shortness of breath and cough.  Gastrointestinal: No abdominal pain. Positive nausea, vomiting, and diarrhea.  No constipation. Genitourinary: Negative for dysuria. Musculoskeletal: Negative for back  pain. Positive body aches.  Skin: Negative for rash. Neurological: Negative for focal weakness or numbness. Positive HA.   10-point ROS otherwise negative.  ____________________________________________   PHYSICAL EXAM:  VITAL SIGNS: ED Triage Vitals  Enc Vitals Group     BP 09/05/20 1514 137/71     Pulse Rate 09/05/20 1514 78     Resp 09/05/20 1514 18     Temp 09/05/20 1514 (!) 101.6 F (38.7 C)     Temp Source 09/05/20 1514 Oral     SpO2 09/05/20 1514 96 %     Weight 09/05/20 1514 190 lb (86.2 kg)     Height 09/05/20 1514 '5\' 7"'$  (1.702 m)   Constitutional: Alert and oriented. Well appearing and in no acute distress. Eyes: Conjunctivae are normal. Head: Atraumatic. Nose: No congestion/rhinnorhea. Mouth/Throat: Mucous membranes are moist.  Neck: No stridor.  Cardiovascular: Normal rate, regular rhythm. Good peripheral circulation. Grossly normal heart sounds.   Respiratory: Normal respiratory effort.  No retractions. Lungs CTAB. Gastrointestinal: Soft and nontender. No distention.  Musculoskeletal: No lower extremity tenderness nor edema. No gross deformities of extremities. Neurologic:  Normal speech and language. No gross focal neurologic deficits are appreciated.  Skin:  Skin is warm, dry and intact. No rash noted.  ____________________________________________   LABS (all labs ordered are listed, but only abnormal results are displayed)  Labs Reviewed  CBC WITH DIFFERENTIAL/PLATELET - Abnormal; Notable for the following components:      Result Value   WBC 1.5 (*)    Hemoglobin 11.2 (*)    HCT 38.5 (*)    MCH 23.4 (*)    MCHC 29.1 (*)    RDW 17.1 (*)    Platelets 114 (*)    Neutro Abs 1.1 (*)    Lymphs Abs 0.3 (*)    All other components within normal limits  COMPREHENSIVE METABOLIC PANEL - Abnormal; Notable for the following components:   CO2 18 (*)    BUN 65 (*)    Creatinine, Ser 9.24 (*)    Calcium 8.5 (*)    Albumin 3.3 (*)    GFR, Estimated 6 (*)     All other components within normal limits  D-DIMER, QUANTITATIVE (NOT AT [facility]) - Abnormal; Notable for the following components:   D-Dimer, Quant 5.09 (*)    All other components within normal limits  LACTATE DEHYDROGENASE - Abnormal; Notable for the following components:   LDH 244 (*)    All other components within normal limits  FERRITIN - Abnormal; Notable for the following components:   Ferritin 1,515 (*)    All other components within normal limits  FIBRINOGEN - Abnormal; Notable for the following components:   Fibrinogen 614 (*)    All other components within normal limits  C-REACTIVE PROTEIN - Abnormal; Notable for the following components:   CRP 7.1 (*)    All other components within normal limits  POC SARS CORONAVIRUS 2 AG -  ED - Abnormal; Notable for the following components:   SARS Coronavirus 2 Ag POSITIVE (*)    All other components within normal limits  CULTURE, BLOOD (ROUTINE X 2)  CULTURE, BLOOD (ROUTINE X 2)  LACTIC ACID, PLASMA  PROCALCITONIN  TRIGLYCERIDES  HEMOGLOBIN A1C  HIV ANTIBODY (ROUTINE TESTING W REFLEX)  CBC WITH DIFFERENTIAL/PLATELET  COMPREHENSIVE METABOLIC PANEL  C-REACTIVE PROTEIN  D-DIMER, QUANTITATIVE (NOT AT [facility])  MAGNESIUM  PHOSPHORUS  FERRITIN   ____________________________________________  EKG   EKG Interpretation  Date/Time:  Monday September 05 2020 18:25:29 EST Ventricular Rate:  69 PR Interval:  164 QRS Duration: 82 QT Interval:  448 QTC Calculation: 480 R Axis:   -50 Text Interpretation: Normal sinus rhythm Left axis deviation Prolonged QT Abnormal ECG No STEMI Confirmed by Nanda Quinton [phone]) on 09/05/2020 6:42:03 PM       ____________________________________________  RADIOLOGY  DG Chest Port 1 View  Result Date: 09/05/2020 CLINICAL DATA:  Fever, cough, diarrhea and vomiting for 3 days, COVID-19 positive EXAM: PORTABLE CHEST 1 VIEW COMPARISON:  08/08/2019 FINDINGS: Single frontal view of the chest demonstrates an  unremarkable cardiac silhouette. Mild increased interstitial opacities most pronounced at the lung bases, without airspace disease, effusion, or pneumothorax. No acute bony abnormalities. IMPRESSION: 1. Interstitial prominence greatest at the lung bases, which could reflect mild atypical pneumonia. No acute airspace disease. Electronically Signed   By: Randa Ngo M.D.   On: 09/05/2020 15:46    ____________________________________________   PROCEDURES  Procedure(s) performed:   .Critical Care Performed by: Margette Fast, MD Authorized by: Margette Fast, MD  Critical care provider statement:    Critical care time (minutes):  35   Critical care time was exclusive of:  Separately billable procedures and treating other patients and teaching time   Critical care was necessary to treat or prevent imminent or life-threatening deterioration of the following conditions:  Respiratory failure   Critical care was time spent personally by me on the following activities:  Discussions with consultants, evaluation of patient's response to treatment, examination of patient, ordering and performing treatments and interventions, ordering and review of laboratory studies, ordering and review of radiographic studies, pulse oximetry, re-evaluation of patient's condition, obtaining history from patient or surrogate, review of old charts, blood draw for specimens and development of treatment plan with patient or surrogate   I assumed direction of critical care for this patient from another provider in my specialty: no     Care discussed with: admitting provider       ____________________________________________   INITIAL IMPRESSION / [place] / ED COURSE  Pertinent labs & imaging results that were available during my care of the patient were reviewed by me and considered in my medical decision making (see chart for details).   Patient presents emergency department for evaluation of cough,  vomiting, diarrhea, shortness of breath.  He has tested positive for COVID-19 here on the antigen test.  He is on 3 L nasal cannula my evaluation.  Patient does not wear home oxygen.  He does seem dyspneic with talking for even brief periods of time.  He is high risk given his immunocompromise status from renal transplant.  He is febrile here and Tylenol was given in triage.  I have added on preadmit Covid labs and will follow his chest x-ray.  Patient likely requiring admission given his new oxygen requirement and immune compromise/high risk status.  Doubt PE clinically.  ACS much lower on my differential.  Patient does not appear volume overloaded on exam to suspect CHF component.   Patient's labs showing expected leukopenia. CKD worsening from prior but electrolytes are WNL. Patient is COVID positive and newly hypoxemic. Plan for decadron, antivirals, and admit.   Discussed patient's case with [facility] to request admission. Patient and family (if present) updated with plan. Care transferred to [facility] service.  I reviewed all nursing notes, vitals, pertinent old records, EKGs, labs, imaging (as available).  ____________________________________________  FINAL CLINICAL IMPRESSION(S) / ED DIAGNOSES  Final diagnoses:  Acute respiratory failure with hypoxia ([place])  COVID-19     MEDICATIONS GIVEN DURING THIS VISIT:  Medications  remdesivir 100 mg in sodium chloride 0.9 % 100 mL IVPB (has no administration in time range)  insulin glargine (LANTUS) injection 30 Units (has no administration in time range)  albuterol (VENTOLIN HFA) 108 (90 Base) MCG/ACT inhaler 2 puff (has no administration in time range)  heparin injection 5,000 Units (has no administration in time range)  dexamethasone (DECADRON) injection 6 mg (has no administration in time range)  guaiFENesin-dextromethorphan (ROBITUSSIN DM) 100-10 MG/5ML syrup 10 mL (has no administration in time range)  ascorbic acid (VITAMIN C) tablet 500 mg (500 mg  Oral Not Given 09/05/20 2158)  zinc sulfate capsule 220 mg (220 mg Oral Not Given 09/05/20 2158)  polyethylene glycol (MIRALAX / GLYCOLAX) packet 17 g (has no administration in time range)  promethazine (PHENERGAN) tablet 12.5 mg (has no administration in time range)  acetaminophen (TYLENOL) tablet 650 mg (has no administration in time range)  insulin aspart (novoLOG) injection 0-15 Units (has no administration in time  range)  insulin aspart (novoLOG) injection 0-5 Units (has no administration in time range)  acetaminophen (TYLENOL) tablet 650 mg (650 mg Oral Given 09/05/20 2000)  dexamethasone (DECADRON) injection 10 mg (10 mg Intravenous Given 09/05/20 2019)  remdesivir 100 mg in sodium chloride 0.9 % 100 mL IVPB (100 mg Intravenous New Bag/Given 09/05/20 2020)    Note:  This document was prepared using Dragon voice recognition software and may include unintentional dictation errors.  Nanda Quinton, MD, [facility] Emergency Medicine    Chanese Hartsough, [place], MD 09/05/20 2229

## 2020-09-05 NOTE — H&P (Signed)
History and Physical    Alejandro Lee [id number] DOB: April 14, 1961 DOA: 09/05/2020  PCP: Monico Blitz, MD   Patient coming from: Home  I have personally briefly reviewed patient's old medical records in [place]  Chief Complaint: Cough, diarrhea  HPI: Alejandro Lee is a 60 y.o. male with medical history significant for  ESRD on HD, failed renal transplant , diastolic CHF, HTN, DM, MI. Patient presented to the ED with complaints of intermittent fever, diarrhea, cough that started 3 days ago.  Reports 3 episodes of vomiting.  Reports baseline chronic diarrhea that is unchanged.   He denies difficulty breathing. Vaccinated for [employer].  ED Course: Temp 101.6, O2 sats 93% on room air.  Elevation in inflammatory markers including D-dimer 5.09. Port Chest-interstitial prominence greatest at the lung bases which could reflect mild atypical pneumonia.  Remdesivir and steroids started hospitalist admit for further evaluation and management.  Review of Systems: As per HPI all other systems reviewed and negative.  Past Medical History:  Diagnosis Date  . Anemia    1 blood transfusion  . Arthritis   . Back pain   . CHF (congestive heart failure) ([place])   . Chronic kidney disease    dialysis M/W/F  . Diabetes mellitus without complication ([place])   . DVT (deep venous thrombosis) ([place])   . GERD (gastroesophageal reflux disease)   . Headache    migraines  . History of cardiovascular stress test 11/2017   [facility]) negative dobutamine stress test  . Hyperlipidemia   . Hypertension   . Legally blind    right  . Myocardial infarction [facility])    patient states it was seen on EKG, denies a cardiac cath    Past Surgical History:  Procedure Laterality Date  . A/V FISTULAGRAM Right 07/22/2018   Procedure: A/V FISTULAGRAM;  Surgeon: Serafina Mitchell, MD;  Location: [place] [facility];  Service: Cardiovascular;  Laterality: Right;  . AV FISTULA PLACEMENT Right   . AV FISTULA PLACEMENT  Right [id number]   Procedure: PLICATION OF RIGHT arm FISTULA;  Surgeon: Waynetta Sandy, MD;  Location: [place];  Service: Vascular;  Laterality: Right;  . BASCILIC VEIN TRANSPOSITION Left 02/05/2019   Procedure: BRACHIOCEPHALIC FISTULA  LEFT ARM;  Surgeon: Serafina Mitchell, MD;  Location: [facility];  Service: Vascular;  Laterality: Left;  . COLONOSCOPY    . diabetic cyst removal Bilateral    on buttocks x8  . EYE SURGERY    . FISTULA SUPERFICIALIZATION Right [id number]   Procedure: PLICATION OF ARTERIOVENOUS FISTULA RIGHT ARM;  Surgeon: Conrad Mc[place], MD;  Location: [place];  Service: Vascular;  Laterality: Right;  . 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  . lens replacement Left   . LIGATION OF ARTERIOVENOUS  FISTULA  03/19/2019   Procedure: LIGATION OF ARTERIOVENOUS  OF COMPETING BRANCHES OF FISTULA LEFT UPPER ARM;  Surgeon: Serafina Mitchell, MD;  Location: [facility];  Service: Vascular;;  . LIGATION OF ARTERIOVENOUS  FISTULA Right 05/07/2019   Procedure: LIGATION OF ARTERIOVENOUS  FISTULA RIGHT ARM;  Surgeon: Serafina Mitchell, MD;  Location: [place];  Service: Vascular;  Laterality: Right;  . NEPHRECTOMY TRANSPLANTED ORGAN    . PERIPHERAL VASCULAR BALLOON ANGIOPLASTY  07/22/2018   Procedure: PERIPHERAL VASCULAR BALLOON ANGIOPLASTY;  Surgeon: Serafina Mitchell, MD;  Location: [place] [facility];  Service: Cardiovascular;;  Right [place] fistula   . PERIPHERAL VASCULAR BALLOON ANGIOPLASTY Right 02/03/2019   Procedure: PERIPHERAL VASCULAR  BALLOON ANGIOPLASTY;  Surgeon: Serafina Mitchell, MD;  Location: [place] [facility];  Service: Cardiovascular;  Laterality: Right;  RUE AVF  . REFRACTIVE SURGERY Bilateral      reports that he has quit smoking. His smoking use included cigarettes. He has never used smokeless tobacco. He reports previous alcohol use. He reports that he does not use drugs.  No Known Allergies  Family History  Problem Relation Age of Onset  . Diabetes Mother   . Hypertension Mother    . Hyperlipidemia Mother   . Diabetes Father   . Heart disease Father   . Cancer Father   . Hypertension Father   . Hyperlipidemia Father   . Stroke Father   . Diabetes Sister   . Hypertension Sister   . Diabetes Brother   . Hypertension Brother   . Cancer Brother     Prior to Admission medications   Medication Sig Start Date End Date Taking? Authorizing Provider  acetaminophen (TYLENOL) 500 MG tablet Take 1,000 mg by mouth every 6 (six) hours as needed for moderate pain.    [provider]  albuterol (VENTOLIN HFA) 108 (90 Base) MCG/ACT inhaler Inhale 2 puffs into the lungs every 6 (six) hours as needed for wheezing or shortness of breath.    [provider]  amLODipine (NORVASC) 10 MG tablet Take 10 mg by mouth daily. 07/30/19   [provider]  azaTHIOprine (IMURAN) 50 MG tablet Take 200 mg by mouth in the morning.    [provider]  ciprofloxacin (CIPRO) 500 MG tablet Take 500 mg by mouth daily.    [provider]  Continuous Blood Gluc Receiver (FREESTYLE LIBRE 2 READER) DEVI As directed 08/09/20   Cassandria Anger, MD  Continuous Blood Gluc Sensor (FREESTYLE LIBRE 2 SENSOR) MISC 1 Piece by Does not apply route every 14 (fourteen) days. 08/09/20   Cassandria Anger, MD  Insulin Pen Needle (B-D ULTRAFINE III SHORT PEN) 31G X 8 MM MISC 1 each by Does not apply route as directed. 08/09/20   Cassandria Anger, MD  labetalol (NORMODYNE) 200 MG tablet Take 200 mg by mouth 3 (three) times daily.  07/01/19   [provider]  LANTUS SOLOSTAR 100 UNIT/ML Solostar Pen Inject 40 Units into the skin at bedtime. 08/09/20   Cassandria Anger, MD  NOVOLOG FLEXPEN 100 UNIT/ML FlexPen Inject 8-14 Units into the skin 3 (three) times daily before meals. 08/09/20   Cassandria Anger, MD  pantoprazole (PROTONIX) 40 MG tablet Take 40 mg by mouth daily. 07/01/19   [provider]  predniSONE (DELTASONE) 10 MG tablet Take  10 mg by mouth daily with breakfast.    [provider]  sulfamethoxazole-trimethoprim (BACTRIM) 400-80 MG tablet Take 1 tablet by mouth every Monday, Wednesday, and Friday.    [provider]  Tacrolimus ER 4 MG TB24 Take 16 mg by mouth daily in the afternoon.    [provider]    Physical Exam: Vitals:   09/05/20 1514 09/05/20 1518  BP: 137/71   Pulse: 78   Resp: 18   Temp: (!) 101.6 F (38.7 C)   TempSrc: Oral   SpO2: 96% 99%  Weight: 86.2 kg   Height: '5\' 7"'$  (1.702 m)     Constitutional: NAD, calm, comfortable Vitals:   09/05/20 1514 09/05/20 1518  BP: 137/71   Pulse: 78   Resp: 18   Temp: (!) 101.6 F (38.7 C)   TempSrc: Oral  SpO2: 96% 99%  Weight: 86.2 kg   Height: '5\' 7"'$  (1.702 m)    Eyes: PERRL, lids and conjunctivae normal ENMT: Mucous membranes are moist. Neck: normal, supple, no masses, no thyromegaly Respiratory: . Normal respiratory effort. No accessory muscle use.  Cardiovascular: Regular rate and rhythm, 2+ pedal pulses.  Abdomen: Full, no tenderness, no masses palpated. No hepatosplenomegaly. Bowel sounds positive.  Musculoskeletal: no clubbing / cyanosis. No joint deformity upper and lower extremities. Good ROM, no contractures. Normal muscle tone.  Skin: no rashes, lesions, ulcers. No induration Neurologic: No apparent cranial abnormality, moving extremities spontaneously. Psychiatric: Normal judgment and insight. Alert and oriented x 3. Normal mood.   Labs on Admission: I have personally reviewed following labs and imaging studies  CBC: Recent Labs  Lab 09/05/20 1632  WBC 1.5*  NEUTROABS 1.1*  HGB 11.2*  HCT 38.5*  MCV 80.4  PLT 99991111*   Basic Metabolic Panel: Recent Labs  Lab 09/05/20 1632  NA 136  K 4.3  CL 104  CO2 18*  GLUCOSE 93  BUN 65*  CREATININE 9.24*  CALCIUM 8.5*   Liver Function Tests: Recent Labs  Lab 09/05/20 1632  AST 21  ALT 10  ALKPHOS 93  BILITOT 0.7  PROT 7.0  ALBUMIN 3.3*    Anemia Panel: Recent Labs    09/05/20 1632  FERRITIN 1,515*   Urine analysis: No results found for: COLORURINE, APPEARANCEUR, LABSPEC, PHURINE, GLUCOSEU, HGBUR, BILIRUBINUR, KETONESUR, PROTEINUR, UROBILINOGEN, NITRITE, LEUKOCYTESUR  Radiological Exams on Admission: DG Chest Port 1 View  Result Date: 09/05/2020 CLINICAL DATA:  Fever, cough, diarrhea and vomiting for 3 days, COVID-19 positive EXAM: PORTABLE CHEST 1 VIEW COMPARISON:  08/08/2019 FINDINGS: Single frontal view of the chest demonstrates an unremarkable cardiac silhouette. Mild increased interstitial opacities most pronounced at the lung bases, without airspace disease, effusion, or pneumothorax. No acute bony abnormalities. IMPRESSION: 1. Interstitial prominence greatest at the lung bases, which could reflect mild atypical pneumonia. No acute airspace disease. Electronically Signed   By: Randa Ngo M.D.   On: 09/05/2020 15:46    EKG: Independently reviewed.   Assessment/Plan Principal Problem:   Pneumonia due to COVID-19 virus Active Problems:   Diabetes mellitus with end-stage renal disease ([place])   ESRD (end stage renal disease) on dialysis ([facility])   Hypertension   Chronic diastolic HF (heart failure) ([facility])   Benign essential HTN   Pneumonia due to COVID-19 virus-cough without dyspnea,.  O2 sats 93% on room air.  Portable chest x-ray shows interstitial prominence greatest at the lung bases which could reflect mild atypical pneumonia.  Day 3 of symptoms. Vaccinated x 2.  -Trend inflammatory markers - IV remdesivir and dexamethasone -CBC, CMP daily -Incentive spirometry, flutter valve, as needed albuterol inhaler -  multivitamins  ESRD, hx of failed renal transplant- HD schedule Monday Wednesday Friday.  Last HD was Friday 3 days ago, missed HD today.  Blood pressure,and electrolytes stable.  No signs of volume overload. -Please consult nephrology in the morning -Resume azathioprine and tacrolimus pending med  reconciliation  HTN-stable. -Resume Home medication pending med reconciliation  DM-random glucose 93. - SSI- M -Resume home Lantus at reduced dose 30 units nightly -Monitor for hyperglycemia while on steroids - HgbA1c   DVT prophylaxis: Heparin Code Status: Full code Family Communication: None at bedside Disposition Plan: ~ 2 days Consults called: None Admission status: Inpt, Tele I certify that at the point of admission it is my clinical judgment that the patient will require inpatient hospital care  spanning beyond 2 midnights from the point of admission due to high intensity of service, high risk for further deterioration and high frequency of surveillance required.    Bethena Roys MD [facility]  09/05/2020, 9:55 PM

## 2020-09-05 NOTE — ED Triage Notes (Signed)
Fever, diarrhea, cough and vomiting for 3 days

## 2020-09-06 DIAGNOSIS — Z992 Dependence on renal dialysis: Secondary | ICD-10-CM

## 2020-09-06 DIAGNOSIS — E1122 Type 2 diabetes mellitus with diabetic chronic kidney disease: Secondary | ICD-10-CM

## 2020-09-06 DIAGNOSIS — I5032 Chronic diastolic (congestive) heart failure: Secondary | ICD-10-CM

## 2020-09-06 DIAGNOSIS — N186 End stage renal disease: Secondary | ICD-10-CM

## 2020-09-06 LAB — CBG MONITORING, ED
Glucose-Capillary: 221 mg/dL — ABNORMAL HIGH (ref 70–99)
Glucose-Capillary: 242 mg/dL — ABNORMAL HIGH (ref 70–99)
Glucose-Capillary: 243 mg/dL — ABNORMAL HIGH (ref 70–99)
Glucose-Capillary: 250 mg/dL — ABNORMAL HIGH (ref 70–99)
Glucose-Capillary: 227 mg/dL — ABNORMAL HIGH (ref 70–99)

## 2020-09-06 LAB — COMPREHENSIVE METABOLIC PANEL
ALT: 10 U/L (ref 0–44)
AST: 18 U/L (ref 15–41)
Albumin: 3.1 g/dL — ABNORMAL LOW (ref 3.5–5.0)
Alkaline Phosphatase: 88 U/L (ref 38–126)
Anion gap: 16 — ABNORMAL HIGH (ref 5–15)
BUN: 78 mg/dL — ABNORMAL HIGH (ref 6–20)
CO2: 17 mmol/L — ABNORMAL LOW (ref 22–32)
Calcium: 8.4 mg/dL — ABNORMAL LOW (ref 8.9–10.3)
Chloride: 103 mmol/L (ref 98–111)
Creatinine, Ser: 9.46 mg/dL — ABNORMAL HIGH (ref 0.61–1.24)
GFR, Estimated: 6 mL/min — ABNORMAL LOW (ref >60)
Glucose, Bld: 256 mg/dL — ABNORMAL HIGH (ref 70–99)
Potassium: 5 mmol/L (ref 3.5–5.1)
Sodium: 136 mmol/L (ref 135–145)
Total Bilirubin: 0.6 mg/dL (ref 0.3–1.2)
Total Protein: 6.6 g/dL (ref 6.5–8.1)

## 2020-09-06 LAB — CBC WITH DIFFERENTIAL/PLATELET
Basophils Absolute: 0 10*3/uL (ref 0.0–0.1)
Basophils Relative: 0 %
Eosinophils Absolute: 0 10*3/uL (ref 0.0–0.5)
Eosinophils Relative: 0 %
HCT: 37.5 % — ABNORMAL LOW (ref 39.0–52.0)
Hemoglobin: 10.9 g/dL — ABNORMAL LOW (ref 13.0–17.0)
Lymphocytes Relative: 28 %
Lymphs Abs: 0.3 10*3/uL — ABNORMAL LOW (ref 0.7–4.0)
MCH: 23.3 pg — ABNORMAL LOW (ref 26.0–34.0)
MCHC: 29.1 g/dL — ABNORMAL LOW (ref 30.0–36.0)
MCV: 80.1 fL (ref 80.0–100.0)
Monocytes Absolute: 0.2 10*3/uL (ref 0.1–1.0)
Monocytes Relative: 20 %
Neutro Abs: 0.5 10*3/uL — ABNORMAL LOW (ref 1.7–7.7)
Neutrophils Relative %: 52 %
Platelets: 106 10*3/uL — ABNORMAL LOW (ref 150–400)
RBC: 4.68 MIL/uL (ref 4.22–5.81)
RDW: 16.7 % — ABNORMAL HIGH (ref 11.5–15.5)
WBC: 0.9 10*3/uL — CL (ref 4.0–10.5)
nRBC: 0 % (ref 0.0–0.2)

## 2020-09-06 LAB — HEMOGLOBIN A1C
Hgb A1c MFr Bld: 8.6 % — ABNORMAL HIGH (ref 4.8–5.6)
Mean Plasma Glucose: 200.12 mg/dL

## 2020-09-06 LAB — C-REACTIVE PROTEIN: CRP: 6.7 mg/dL — ABNORMAL HIGH (ref <1.0)

## 2020-09-06 LAB — MAGNESIUM: Magnesium: 1.9 mg/dL (ref 1.7–2.4)

## 2020-09-06 LAB — FERRITIN: Ferritin: 1553 ng/mL — ABNORMAL HIGH (ref 24–336)

## 2020-09-06 LAB — PHOSPHORUS: Phosphorus: 7.4 mg/dL — ABNORMAL HIGH (ref 2.5–4.6)

## 2020-09-06 LAB — HIV ANTIBODY (ROUTINE TESTING W REFLEX): HIV Screen 4th Generation wRfx: NONREACTIVE

## 2020-09-06 LAB — D-DIMER, QUANTITATIVE: D-Dimer, Quant: 8.36 ug/mL-FEU — ABNORMAL HIGH (ref 0.00–0.50)

## 2020-09-06 MED ORDER — PENTAFLUOROPROP-TETRAFLUOROETH EX AERO: 1.0000 "application " | INHALATION_SPRAY | CUTANEOUS | Status: DC | PRN

## 2020-09-06 MED ORDER — HEPARIN SODIUM (PORCINE) 1000 UNIT/ML DIALYSIS: 2600.0000 [IU] | Freq: Once | INTRAMUSCULAR | Status: DC

## 2020-09-06 MED ORDER — AMLODIPINE BESYLATE 5 MG PO TABS
10.0000 mg | ORAL_TABLET | Freq: Every day | ORAL | Status: DC
  Administered 2020-09-06 – 2020-09-07 (×2): 10 mg via ORAL
  Filled 2020-09-06 (×2): qty 2

## 2020-09-06 MED ORDER — SODIUM CHLORIDE 0.9 % IV SOLN: 100.0000 mL | INTRAVENOUS | Status: DC | PRN

## 2020-09-06 MED ORDER — CHLORHEXIDINE GLUCONATE CLOTH 2 % EX PADS: 6.0000 | MEDICATED_PAD | Freq: Every day | CUTANEOUS | Status: DC

## 2020-09-06 MED ORDER — LIDOCAINE-PRILOCAINE 2.5-2.5 % EX CREA: 1.0000 "application " | TOPICAL_CREAM | CUTANEOUS | Status: DC | PRN

## 2020-09-06 MED ORDER — INSULIN ASPART 100 UNIT/ML ~~LOC~~ SOLN: 10.0000 [IU] | Freq: Three times a day (TID) | SUBCUTANEOUS | Status: DC

## 2020-09-06 MED ORDER — LIDOCAINE HCL (PF) 1 % IJ SOLN: 5.0000 mL | INTRAMUSCULAR | Status: DC | PRN

## 2020-09-06 MED ORDER — DOXYCYCLINE HYCLATE 100 MG PO TABS: 100.0000 mg | ORAL_TABLET | Freq: Two times a day (BID) | ORAL | Status: DC

## 2020-09-06 MED ORDER — DOXYCYCLINE HYCLATE 100 MG PO TABS
100.0000 mg | ORAL_TABLET | Freq: Two times a day (BID) | ORAL | Status: DC
  Administered 2020-09-06 – 2020-09-07 (×2): 100 mg via ORAL
  Filled 2020-09-06 (×2): qty 1

## 2020-09-06 MED ORDER — LABETALOL HCL 200 MG PO TABS
200.0000 mg | ORAL_TABLET | Freq: Three times a day (TID) | ORAL | Status: DC
  Administered 2020-09-06 – 2020-09-07 (×2): 200 mg via ORAL
  Filled 2020-09-06 (×3): qty 1

## 2020-09-06 MED ORDER — SODIUM CHLORIDE 0.9 % IV SOLN
100.0000 mL | INTRAVENOUS | Status: DC | PRN
Start: 2020-09-06 — End: 2020-09-07

## 2020-09-06 MED ORDER — INSULIN ASPART 100 UNIT/ML ~~LOC~~ SOLN
8.0000 [IU] | Freq: Three times a day (TID) | SUBCUTANEOUS | Status: DC
  Administered 2020-09-07 (×2): 8 [IU] via SUBCUTANEOUS
  Filled 2020-09-06: qty 1

## 2020-09-06 MED ORDER — PANTOPRAZOLE SODIUM 40 MG PO TBEC
40.0000 mg | DELAYED_RELEASE_TABLET | Freq: Every day | ORAL | Status: DC
  Administered 2020-09-06 – 2020-09-07 (×2): 40 mg via ORAL
  Filled 2020-09-06 (×2): qty 1

## 2020-09-06 NOTE — ED Notes (Signed)
Pt ambulatory in room. Pt off monitor

## 2020-09-06 NOTE — Procedures (Signed)
   HEMODIALYSIS TREATMENT NOTE:  2 hours and 50 minutes of low-heparin HD completed via left upper arm AVF (15g/antegrade).  Pt has PIV in left forearm.  He states he was "always told that from here [pointing to antecubital space] down was free game."  Difficult cannulation of arterial end of AVF and Qb had to be lowered d/t excessively negative pressure.  Average Qb 300.  Treatment had to be ended 10 minutes early as pt insisted on standing up to urinate.  Tolerated removal of 2.1 liters with stable BP.   All blood was returned and hemostasis was achieved in 20 minutes.  Pt reports that his outpatient HD schedule is twice weekly, on Mondays and Fridays.  He requests consideration of same/similar schedule while he is hospitalized.  Rockwell [place], RN

## 2020-09-06 NOTE — Progress Notes (Signed)
PROGRESS NOTE   Alejandro Lee  [id number] DOB: 1960-09-14 DOA: 09/05/2020 PCP: Monico Blitz, MD   No chief complaint on file.  Level of care: Med-Surg  Brief Admission History:  60 y/o male with ESRD on HD, failed renal transplant, diastolic CHF, HTN, type 2 DM, CAD s/p MI, chronic diarrhea, present to ED with complaints of 3 days of cough, malaise, fever, emesis.  He received 2 Covid vaccines.  He tested positive for Covid infection.  He had abnormal CXR and elevated procalcitonin .  He was admitted for further management.   Assessment & Plan:   Principal Problem:   Pneumonia due to COVID-19 virus Active Problems:   Diabetes mellitus with end-stage renal disease ([place])   ESRD (end stage renal disease) on dialysis ([facility])   Hypertension   Chronic diastolic HF (heart failure) ([facility])   Benign essential HTN  1. Pneumonia - possibly due to covid infection, also with elevated procalcitonin he could have bacterial superinfection. He is being treated for both.  He was started on doxycycline.  Continue remdesivir IV, continue steroids.  Continue bronchodilators and supportive measures.  Vitamins ordered.  Follow inflammatory markers.   2. ESRD on HD - missed HD on 1/24.  I consulted nephrology for inpatient hemodialysis treatment.   3. Essential hypertension -resume home medication.  4. Type 2 diabetes with steroid induced hyperglycemia - see orders, monitor CBG closely. Check a1c.   DVT prophylaxis: heparin SQ Code Status: full  Family Communication: t/c to wife 1/25 updated Disposition: anticipate home Status is: Inpatient  Remains inpatient appropriate because:IV treatments appropriate due to intensity of illness or inability to take PO and Inpatient level of care appropriate due to severity of illness   Dispo: The patient is from: Home              Anticipated d/c is to: Home              Anticipated d/c date is: 1 day              Patient currently is not medically stable to d/c.   He likely could discharge home tomorrow and complete outpatient remdesivir.  Wife reports she can provide 24/7 supervision and care at home.    Difficult to place patient No  Consultants:     Procedures:     Antimicrobials:  Remdesivir 1/24> Doxycycline 1/25>   Subjective: Pt reports fever, malaise, cough and loose stool.   Objective: Vitals:   09/06/20 0848 09/06/20 1200 09/06/20 1300 09/06/20 1530  BP: (!) 153/66 (!) 163/94 96/74 (!) 184/83  Pulse: (!) 58 68 70 68  Resp: '14 18 15 20  '$ Temp:      TempSrc:      SpO2: 100% 100% 96% 95%  Weight:      Height:       No intake or output data in the 24 hours ending 09/06/20 1815 Filed Weights   09/05/20 1514  Weight: 86.2 kg    Examination:  General exam: awake, alert, NAD. Cooperative.  Respiratory system: No increased work of breathing.  Cardiovascular system: normal S1 & S2 heard. No JVD, murmurs, rubs, gallops or clicks. No pedal edema. Gastrointestinal system: Abdomen is nondistended, soft and nontender. No organomegaly or masses felt. Normal bowel sounds heard. Central nervous system: Alert and oriented. No focal neurological deficits. Extremities: Symmetric 5 x 5 power. Skin: No rashes, lesions or ulcers Psychiatry: Judgement and insight appear normal. Mood & affect appropriate.   Data Reviewed:  I have personally reviewed following labs and imaging studies  CBC: Recent Labs  Lab 09/05/20 1632 09/06/20 0642  WBC 1.5* 0.9*  NEUTROABS 1.1* 0.5*  HGB 11.2* 10.9*  HCT 38.5* 37.5*  MCV 80.4 80.1  PLT 114* 106*    Basic Metabolic Panel: Recent Labs  Lab 09/05/20 1632 09/06/20 0642  NA 136 136  K 4.3 5.0  CL 104 103  CO2 18* 17*  GLUCOSE 93 256*  BUN 65* 78*  CREATININE 9.24* 9.46*  CALCIUM 8.5* 8.4*  MG  --  1.9  PHOS  --  7.4*    GFR: Estimated Creatinine Clearance: 9 mL/min (A) (by C-G formula based on SCr of 9.46 mg/dL (H)).  Liver Function Tests: Recent Labs  Lab 09/05/20 1632  09/06/20 0642  AST 21 18  ALT 10 10  ALKPHOS 93 88  BILITOT 0.7 0.6  PROT 7.0 6.6  ALBUMIN 3.3* 3.1*    CBG: Recent Labs  Lab 09/05/20 2304 09/06/20 0845 09/06/20 1318 09/06/20 1704  GLUCAP 147* 221* 242* 243*    Recent Results (from the past 240 hour(s))  Blood Culture (routine x 2)     Status: None (Preliminary result)   Collection Time: 09/05/20  4:32 PM   Specimen: Right Antecubital; Blood  Result Value Ref Range Status   Specimen Description RIGHT ANTECUBITAL  Final   Special Requests   Final    BOTTLES DRAWN AEROBIC AND ANAEROBIC Blood Culture adequate volume   Culture   Final    NO GROWTH < 24 HOURS Performed at [facility], [address] [place], [place] [address]    Report Status PENDING  Incomplete  Blood Culture (routine x 2)     Status: None (Preliminary result)   Collection Time: 09/05/20  4:32 PM   Specimen: BLOOD RIGHT ARM  Result Value Ref Range Status   Specimen Description BLOOD RIGHT ARM  Final   Special Requests   Final    BOTTLES DRAWN AEROBIC AND ANAEROBIC Blood Culture adequate volume   Culture   Final    NO GROWTH < 24 HOURS Performed at [facility], [address] [place], [place] [address]    Report Status PENDING  Incomplete     Radiology Studies: DG Chest Port 1 View  Result Date: 09/05/2020 CLINICAL DATA:  Fever, cough, diarrhea and vomiting for 3 days, COVID-19 positive EXAM: PORTABLE CHEST 1 VIEW COMPARISON:  08/08/2019 FINDINGS: Single frontal view of the chest demonstrates an unremarkable cardiac silhouette. Mild increased interstitial opacities most pronounced at the lung bases, without airspace disease, effusion, or pneumothorax. No acute bony abnormalities. IMPRESSION: 1. Interstitial prominence greatest at the lung bases, which could reflect mild atypical pneumonia. No acute airspace disease. Electronically Signed   By: Randa Ngo M.D.   On: 09/05/2020 15:46    Scheduled Meds: . vitamin C  500 mg Oral Daily  .  Chlorhexidine Gluconate Cloth  6 each Topical Q0600  . dexamethasone (DECADRON) injection  6 mg Intravenous Q24H  . doxycycline  100 mg Oral Q12H  . heparin  5,000 Units Subcutaneous Q8H  . insulin aspart  0-15 Units Subcutaneous TID WC  . insulin aspart  0-5 Units Subcutaneous QHS  . insulin glargine  30 Units Subcutaneous QHS  . zinc sulfate  220 mg Oral Daily   Continuous Infusions: . remdesivir 100 mg in NS 100 mL 100 mg (09/06/20 1629)     LOS: 1 day   Time spent: 37 mins   Halle Davlin,  MD How to contact the [facility] Attending or Consulting provider [place] or covering provider during after hours [place], for this patient?  1. Check the care team in [facility] and look for a) attending/consulting [facility] provider listed and b) the [facility] team listed 2. Log into [email or web address] and use [place]'s universal password to access. If you do not have the password, please contact the hospital operator. 3. Locate the [facility] provider you are looking for under [facility] and page to a number that you can be directly reached. 4. If you still have difficulty reaching the provider, please page the [facility] (Director on Call) for the Hospitalists listed on amion for assistance.  09/06/2020, 6:15 PM

## 2020-09-06 NOTE — Consult Note (Signed)
[facility] Renal Consultation Note    Indication for Consultation:  Management of ESRD/hemodialysis; anemia, hypertension/volume and secondary hyperparathyroidism  HPI: Alejandro Lee is a 60 y.o. male with a PMH significant for HTN, CAD, chronic diastolic CHF, DM type 2, failed kidney transplant and now ESRD on HD MWF at [facility] who presented to [facility] ED on 09/05/20 with a 3 day history of fevers, diarrhea, cough, and N/V.  He missed HD on Monday due to his illness.  In the ED he was febrile at 101.6, elevated D-dimer and inflammatory markers, covid-19 +, and CXR with interstitial infiltrates.  He was admitted for IV steroids and remdesivir.  We were consulted to provide dialysis during his hospitalization.   He reports good UOP but has been weaned off of his immunosuppressive agents at [facility] transplant clinic.  Past Medical History:  Diagnosis Date  . Anemia    1 blood transfusion  . Arthritis   . Back pain   . CHF (congestive heart failure) ([place])   . Chronic kidney disease    dialysis M/W/F  . Diabetes mellitus without complication ([place])   . DVT (deep venous thrombosis) ([place])   . GERD (gastroesophageal reflux disease)   . Headache    migraines  . History of cardiovascular stress test 11/2017   [facility]) negative dobutamine stress test  . Hyperlipidemia   . Hypertension   . Legally blind    right  . Myocardial infarction [facility])    patient states it was seen on EKG, denies a cardiac cath   Past Surgical History:  Procedure Laterality Date  . A/V FISTULAGRAM Right 07/22/2018   Procedure: A/V FISTULAGRAM;  Surgeon: Serafina Mitchell, MD;  Location: [place] CV [facility];  Service: Cardiovascular;  Laterality: Right;  . AV FISTULA PLACEMENT Right   . AV FISTULA PLACEMENT Right [id number]   Procedure: PLICATION OF RIGHT arm FISTULA;  Surgeon: Waynetta Sandy, MD;  Location: [place];  Service: Vascular;  Laterality: Right;  . BASCILIC VEIN TRANSPOSITION Left  02/05/2019   Procedure: BRACHIOCEPHALIC FISTULA  LEFT ARM;  Surgeon: Serafina Mitchell, MD;  Location: [facility] OR;  Service: Vascular;  Laterality: Left;  . COLONOSCOPY    . diabetic cyst removal Bilateral    on buttocks x8  . EYE SURGERY    . FISTULA SUPERFICIALIZATION Right [id number]   Procedure: PLICATION OF ARTERIOVENOUS FISTULA RIGHT ARM;  Surgeon: Conrad Bogue Chitto, MD;  Location: [place];  Service: Vascular;  Laterality: Right;  . 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  . lens replacement Left   . LIGATION OF ARTERIOVENOUS  FISTULA  03/19/2019   Procedure: LIGATION OF ARTERIOVENOUS  OF COMPETING BRANCHES OF FISTULA LEFT UPPER ARM;  Surgeon: Serafina Mitchell, MD;  Location: [facility] OR;  Service: Vascular;;  . LIGATION OF ARTERIOVENOUS  FISTULA Right 05/07/2019   Procedure: LIGATION OF ARTERIOVENOUS  FISTULA RIGHT ARM;  Surgeon: Serafina Mitchell, MD;  Location: [place];  Service: Vascular;  Laterality: Right;  . NEPHRECTOMY TRANSPLANTED ORGAN    . PERIPHERAL VASCULAR BALLOON ANGIOPLASTY  07/22/2018   Procedure: PERIPHERAL VASCULAR BALLOON ANGIOPLASTY;  Surgeon: Serafina Mitchell, MD;  Location: [place] [facility];  Service: Cardiovascular;;  Right [place] fistula   . PERIPHERAL VASCULAR BALLOON ANGIOPLASTY Right 02/03/2019   Procedure: PERIPHERAL VASCULAR BALLOON ANGIOPLASTY;  Surgeon: Serafina Mitchell, MD;  Location: [place] [facility];  Service: Cardiovascular;  Laterality: Right;  RUE AVF  . REFRACTIVE SURGERY Bilateral  Family History:   Family History  Problem Relation Age of Onset  . Diabetes Mother   . Hypertension Mother   . Hyperlipidemia Mother   . Diabetes Father   . Heart disease Father   . Cancer Father   . Hypertension Father   . Hyperlipidemia Father   . Stroke Father   . Diabetes Sister   . Hypertension Sister   . Diabetes Brother   . Hypertension Brother   . Cancer Brother    Social History:  reports that he has quit smoking. His smoking use included cigarettes. He has never used  smokeless tobacco. He reports previous alcohol use. He reports that he does not use drugs. No Known Allergies Prior to Admission medications   Medication Sig Start Date End Date Taking? Authorizing Provider  acetaminophen (TYLENOL) 500 MG tablet Take 1,000 mg by mouth every 6 (six) hours as needed for moderate pain.   Yes [provider]  albuterol (VENTOLIN HFA) 108 (90 Base) MCG/ACT inhaler Inhale 2 puffs into the lungs every 6 (six) hours as needed for wheezing or shortness of breath.   Yes [provider]  amLODipine (NORVASC) 10 MG tablet Take 10 mg by mouth daily. 07/30/19  Yes [provider]  azaTHIOprine (IMURAN) 50 MG tablet Take 50 mg by mouth daily.   Yes [provider]  labetalol (NORMODYNE) 200 MG tablet Take 200 mg by mouth 3 (three) times daily.  07/01/19  Yes [provider]  LANTUS SOLOSTAR 100 UNIT/ML Solostar Pen Inject 40 Units into the skin at bedtime. Patient taking differently: Inject 30 Units into the skin at bedtime. 08/09/20  Yes Nida, Marella Chimes, MD  NOVOLOG FLEXPEN 100 UNIT/ML FlexPen Inject 8-14 Units into the skin 3 (three) times daily before meals. Patient taking differently: Inject 8-10 Units into the skin 3 (three) times daily before meals. 08/09/20  Yes Cassandria Anger, MD  pantoprazole (PROTONIX) 40 MG tablet Take 40 mg by mouth daily. 07/01/19  Yes [provider]  Tacrolimus ER 4 MG TB24 Take 16 mg by mouth daily in the afternoon.   Yes [provider]  predniSONE (DELTASONE) 10 MG tablet Take 10 mg by mouth daily with breakfast. Patient not taking: Reported on 09/06/2020    [provider]  sulfamethoxazole-trimethoprim (BACTRIM) 400-80 MG tablet Take 1 tablet by mouth every Monday, Wednesday, and Friday. Patient not taking: Reported on 09/06/2020    [provider]   Current Facility-Administered Medications  Medication Dose Route Frequency Provider Last Rate Last  Admin  . acetaminophen (TYLENOL) tablet 650 mg  650 mg Oral Q6H PRN Emokpae, Ejiroghene E, MD      . albuterol (VENTOLIN HFA) 108 (90 Base) MCG/ACT inhaler 2 puff  2 puff Inhalation Q6H PRN Emokpae, Ejiroghene E, MD      . ascorbic acid (VITAMIN C) tablet 500 mg  500 mg Oral Daily Emokpae, Ejiroghene E, MD   500 mg at 09/06/20 0847  . Chlorhexidine Gluconate Cloth 2 % PADS 6 each  6 each Topical Q0600 Donato Heinz, MD      . dexamethasone (DECADRON) injection 6 mg  6 mg Intravenous Q24H Emokpae, Ejiroghene E, MD   6 mg at 09/06/20 0840  . guaiFENesin-dextromethorphan (ROBITUSSIN DM) 100-10 MG/5ML syrup 10 mL  10 mL Oral Q4H PRN Emokpae, Ejiroghene E, MD      . heparin injection 5,000 Units  5,000 Units Subcutaneous Q8H Emokpae, Ejiroghene E, MD   5,000 Units at 09/06/20 0622  .  insulin aspart (novoLOG) injection 0-15 Units  0-15 Units Subcutaneous TID WC Emokpae, Ejiroghene E, MD   5 Units at 09/06/20 1059  . insulin aspart (novoLOG) injection 0-5 Units  0-5 Units Subcutaneous QHS Emokpae, Ejiroghene E, MD      . insulin glargine (LANTUS) injection 30 Units  30 Units Subcutaneous QHS Emokpae, Ejiroghene E, MD      . polyethylene glycol (MIRALAX / GLYCOLAX) packet 17 g  17 g Oral Daily PRN Emokpae, Ejiroghene E, MD      . promethazine (PHENERGAN) tablet 12.5 mg  12.5 mg Oral Q6H PRN Emokpae, Ejiroghene E, MD      . remdesivir 100 mg in sodium chloride 0.9 % 100 mL IVPB  100 mg Intravenous Daily Emokpae, Ejiroghene E, MD      . zinc sulfate capsule 220 mg  220 mg Oral Daily Emokpae, Ejiroghene E, MD   220 mg at 09/06/20 [id number]   Current Outpatient Medications  Medication Sig Dispense Refill  . acetaminophen (TYLENOL) 500 MG tablet Take 1,000 mg by mouth every 6 (six) hours as needed for moderate pain.    Marland Kitchen albuterol (VENTOLIN HFA) 108 (90 Base) MCG/ACT inhaler Inhale 2 puffs into the lungs every 6 (six) hours as needed for wheezing or shortness of breath.    Marland Kitchen amLODipine (NORVASC) 10 MG tablet  Take 10 mg by mouth daily.    Marland Kitchen azaTHIOprine (IMURAN) 50 MG tablet Take 50 mg by mouth daily.    Marland Kitchen labetalol (NORMODYNE) 200 MG tablet Take 200 mg by mouth 3 (three) times daily.     Marland Kitchen LANTUS SOLOSTAR 100 UNIT/ML Solostar Pen Inject 40 Units into the skin at bedtime. (Patient taking differently: Inject 30 Units into the skin at bedtime.) 15 mL 1  . NOVOLOG FLEXPEN 100 UNIT/ML FlexPen Inject 8-14 Units into the skin 3 (three) times daily before meals. (Patient taking differently: Inject 8-10 Units into the skin 3 (three) times daily before meals.) 15 mL 1  . pantoprazole (PROTONIX) 40 MG tablet Take 40 mg by mouth daily.    . Tacrolimus ER 4 MG TB24 Take 16 mg by mouth daily in the afternoon.    . predniSONE (DELTASONE) 10 MG tablet Take 10 mg by mouth daily with breakfast. (Patient not taking: Reported on 09/06/2020)    . sulfamethoxazole-trimethoprim (BACTRIM) 400-80 MG tablet Take 1 tablet by mouth every Monday, Wednesday, and Friday. (Patient not taking: Reported on 09/06/2020)     Labs: Basic Metabolic Panel: Recent Labs  Lab 09/05/20 1632 09/06/20 0642  NA 136 136  K 4.3 5.0  CL 104 103  CO2 18* 17*  GLUCOSE 93 256*  BUN 65* 78*  CREATININE 9.24* 9.46*  CALCIUM 8.5* 8.4*  PHOS  --  7.4*   Liver Function Tests: Recent Labs  Lab 09/05/20 1632 09/06/20 0642  AST 21 18  ALT 10 10  ALKPHOS 93 88  BILITOT 0.7 0.6  PROT 7.0 6.6  ALBUMIN 3.3* 3.1*   No results for input(s): LIPASE, AMYLASE in the last 168 hours. No results for input(s): AMMONIA in the last 168 hours. CBC: Recent Labs  Lab 09/05/20 1632 09/06/20 0642  WBC 1.5* 0.9*  NEUTROABS 1.1* PENDING  HGB 11.2* 10.9*  HCT 38.5* 37.5*  MCV 80.4 80.1  PLT 114* 106*   Cardiac Enzymes: No results for input(s): CKTOTAL, CKMB, CKMBINDEX, TROPONINI in the last 168 hours. CBG: Recent Labs  Lab 09/05/20 2304 09/06/20 0845  GLUCAP 147* 221*   Iron Studies:  Recent Labs    09/06/20 [id number]  FERRITIN 1,553*    Studies/Results: DG Chest Port 1 View  Result Date: 09/05/2020 CLINICAL DATA:  Fever, cough, diarrhea and vomiting for 3 days, COVID-19 positive EXAM: PORTABLE CHEST 1 VIEW COMPARISON:  08/08/2019 FINDINGS: Single frontal view of the chest demonstrates an unremarkable cardiac silhouette. Mild increased interstitial opacities most pronounced at the lung bases, without airspace disease, effusion, or pneumothorax. No acute bony abnormalities. IMPRESSION: 1. Interstitial prominence greatest at the lung bases, which could reflect mild atypical pneumonia. No acute airspace disease. Electronically Signed   By: Randa Ngo M.D.   On: 09/05/2020 15:46    ROS: Pertinent items are noted in HPI. Physical Exam: Vitals:   09/06/20 0000 09/06/20 0100 09/06/20 0300 09/06/20 0848  BP: (!) 142/47 (!) 174/62 (!) 174/69 (!) 153/66  Pulse: 65  70 (!) 58  Resp: (!) 24 (!) 22 (!) 22 14  Temp:      TempSrc:      SpO2: (!) 82% 95% 100% 100%  Weight:      Height:          Weight change:  No intake or output data in the 24 hours ending 09/06/20 1124 BP (!) 153/66 (BP Location: Right Arm)   Pulse (!) 58   Temp 98.7 F (37.1 C)   Resp 14   Ht '5\' 7"'$  (1.702 m)   Wt 86.2 kg   SpO2 100%   BMI 29.76 kg/m  General appearance: alert, cooperative and no distress Head: Normocephalic, without obvious abnormality, atraumatic Resp: clear to auscultation bilaterally Cardio: regular rate and rhythm, S1, S2 normal, no murmur, click, rub or gallop GI: soft, non-tender; bowel sounds normal; no masses,  no organomegaly Extremities: extremities normal, atraumatic, no cyanosis or edema and LUE AVF +T/B Dialysis Access:  Dialysis Orders: Center: [facility]  on MWF . EDW 88kg HD Bath 2K/2.5Ca  Time 4 hours Heparin 2000 units bolus then 1000 units/hr. Access LUE AVF BFR 350 DFR 500    Epogen 2600   Units IV/HD  Venofer  50 mg IV weekly    Assessment/Plan: 1.  Pneumonia due to covid-19 virus- mild symptoms,  mainly cough without SOB.  On IV dexamethasone and remdesivir per primary svc. 2.  ESRD -  Missed HD on Monday and will plan on HD today. 3.  Hypertension/volume  - stable 4.  Anemia  - stable 5.  Metabolic bone disease -  Continue with outpatient meds 6.  Nutrition - renal diet, carb modified 7. 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  Does not need to resume especially in light of covid-19 infection. 8. DM- per primary svc  Donetta Potts, MD [place] Pager [phone] 09/06/2020, 11:24 AM

## 2020-09-07 LAB — CBC WITH DIFFERENTIAL/PLATELET
Abs Immature Granulocytes: 0.01 10*3/uL (ref 0.00–0.07)
Basophils Absolute: 0 10*3/uL (ref 0.0–0.1)
Basophils Relative: 0 %
Eosinophils Absolute: 0 10*3/uL (ref 0.0–0.5)
Eosinophils Relative: 0 %
HCT: 38.6 % — ABNORMAL LOW (ref 39.0–52.0)
Hemoglobin: 11.8 g/dL — ABNORMAL LOW (ref 13.0–17.0)
Immature Granulocytes: 0 %
Lymphocytes Relative: 9 %
Lymphs Abs: 0.3 10*3/uL — ABNORMAL LOW (ref 0.7–4.0)
MCH: 23.1 pg — ABNORMAL LOW (ref 26.0–34.0)
MCHC: 30.6 g/dL (ref 30.0–36.0)
MCV: 75.5 fL — ABNORMAL LOW (ref 80.0–100.0)
Monocytes Absolute: 0.3 10*3/uL (ref 0.1–1.0)
Monocytes Relative: 10 %
Neutro Abs: 2.5 10*3/uL (ref 1.7–7.7)
Neutrophils Relative %: 81 %
Platelets: 150 10*3/uL (ref 150–400)
RBC: 5.11 MIL/uL (ref 4.22–5.81)
RDW: 16.1 % — ABNORMAL HIGH (ref 11.5–15.5)
WBC: 3.1 10*3/uL — ABNORMAL LOW (ref 4.0–10.5)
nRBC: 0 % (ref 0.0–0.2)

## 2020-09-07 LAB — MAGNESIUM: Magnesium: 1.9 mg/dL (ref 1.7–2.4)

## 2020-09-07 LAB — COMPREHENSIVE METABOLIC PANEL
ALT: 11 U/L (ref 0–44)
AST: 22 U/L (ref 15–41)
Albumin: 3.3 g/dL — ABNORMAL LOW (ref 3.5–5.0)
Alkaline Phosphatase: 91 U/L (ref 38–126)
Anion gap: 14 (ref 5–15)
BUN: 63 mg/dL — ABNORMAL HIGH (ref 6–20)
CO2: 18 mmol/L — ABNORMAL LOW (ref 22–32)
Calcium: 8.8 mg/dL — ABNORMAL LOW (ref 8.9–10.3)
Chloride: 101 mmol/L (ref 98–111)
Creatinine, Ser: 6.58 mg/dL — ABNORMAL HIGH (ref 0.61–1.24)
GFR, Estimated: 9 mL/min — ABNORMAL LOW (ref >60)
Glucose, Bld: 193 mg/dL — ABNORMAL HIGH (ref 70–99)
Potassium: 3.9 mmol/L (ref 3.5–5.1)
Sodium: 133 mmol/L — ABNORMAL LOW (ref 135–145)
Total Bilirubin: 0.5 mg/dL (ref 0.3–1.2)
Total Protein: 7.1 g/dL (ref 6.5–8.1)

## 2020-09-07 LAB — FERRITIN: Ferritin: 1622 ng/mL — ABNORMAL HIGH (ref 24–336)

## 2020-09-07 LAB — CBG MONITORING, ED
Glucose-Capillary: 215 mg/dL — ABNORMAL HIGH (ref 70–99)
Glucose-Capillary: 257 mg/dL — ABNORMAL HIGH (ref 70–99)

## 2020-09-07 LAB — C-REACTIVE PROTEIN: CRP: 4.7 mg/dL — ABNORMAL HIGH (ref <1.0)

## 2020-09-07 LAB — PHOSPHORUS: Phosphorus: 5.6 mg/dL — ABNORMAL HIGH (ref 2.5–4.6)

## 2020-09-07 LAB — D-DIMER, QUANTITATIVE: D-Dimer, Quant: 3.35 ug/mL-FEU — ABNORMAL HIGH (ref 0.00–0.50)

## 2020-09-07 MED ORDER — LABETALOL HCL 200 MG PO TABS: 200.0000 mg | ORAL_TABLET | Freq: Three times a day (TID) | ORAL | 5 refills | Status: AC

## 2020-09-07 MED ORDER — GUAIFENESIN-DM 100-10 MG/5ML PO SYRP: 10.0000 mL | ORAL_SOLUTION | ORAL | 0 refills | Status: AC | PRN

## 2020-09-07 MED ORDER — ASCORBIC ACID 500 MG PO TABS: 500.0000 mg | ORAL_TABLET | Freq: Every day | ORAL | 3 refills | Status: AC

## 2020-09-07 MED ORDER — AZATHIOPRINE 50 MG PO TABS: 50.0000 mg | ORAL_TABLET | Freq: Every day | ORAL | 3 refills | Status: AC

## 2020-09-07 MED ORDER — DOXYCYCLINE HYCLATE 100 MG PO TABS: 100.0000 mg | ORAL_TABLET | Freq: Two times a day (BID) | ORAL | 0 refills | Status: AC

## 2020-09-07 MED ORDER — ZINC SULFATE 220 (50 ZN) MG PO CAPS: 220.0000 mg | ORAL_CAPSULE | Freq: Every day | ORAL | 4 refills | Status: AC

## 2020-09-07 MED ORDER — PREDNISONE 20 MG PO TABS: 40.0000 mg | ORAL_TABLET | Freq: Every day | ORAL | 0 refills | Status: AC

## 2020-09-07 MED ORDER — TACROLIMUS ER 4 MG PO TB24: 16.0000 mg | ORAL_TABLET | Freq: Every day | ORAL | 3 refills | Status: AC

## 2020-09-07 MED ORDER — AMLODIPINE BESYLATE 10 MG PO TABS: 10.0000 mg | ORAL_TABLET | Freq: Every day | ORAL | 4 refills | Status: AC

## 2020-09-07 NOTE — ED Notes (Signed)
Pt removed all equipment - cardiac monitor, bp cuff, O2  States he can not sleep with it on MD aware will cont to monitor

## 2020-09-07 NOTE — Discharge Summary (Signed)
Alejandro Lee, is a 60 y.o. male  DOB 1961-07-22  MRN [id number].  Admission date:  09/05/2020  Admitting Physician  Bethena Roys, MD  Discharge Date:  09/07/2020   Primary MD  Monico Blitz, MD  Recommendations for primary care physician for things to follow:    1)You are strongly advised to  to isolate/quarantine for at least 21 days from the date of your diagnoses with COVID-19 infection--please always wear a mask if you have to go outside the house  2)Avoid ibuprofen/Advil/Aleve/Motrin/Goody Powders/Naproxen/BC powders/Meloxicam/Diclofenac/Indomethacin and other Nonsteroidal anti-inflammatory medications as these will make you more likely to bleed and can cause stomach ulcers, can also cause Kidney problems.   3)Virtual/Video Visit with Monico Blitz, MD within 1 week --  4)Very low-salt diet advised  5)Weigh yourself daily, call if you gain more than 3 pounds in 1 day or more than 5 pounds in 1 week as your hemodialysis dry weight and schedule may have to be adjusted  6)Limit your Fluid  intake to no more than 50 ounces (1.5 Liters) per day    Admission Diagnosis  Pneumonia due to COVID-19 virus [U07.1, J12.82]   Discharge Diagnosis  Pneumonia due to COVID-19 virus [U07.1, J12.82]    Principal Problem:   Pneumonia due to COVID-19 virus Active Problems:   ESRD (end stage renal disease) on dialysis ([place])   Hypertension   Chronic diastolic HF (heart failure) ([place])   Diabetes mellitus with end-stage renal disease ([place])   Benign essential HTN      Past Medical History:  Diagnosis Date  . Anemia    1 blood transfusion  . Arthritis   . Back pain   . CHF (congestive heart failure) ([place])   . Chronic kidney disease    dialysis M/W/F  . Diabetes mellitus without complication ([place])   . DVT (deep venous thrombosis) ([place])   . GERD (gastroesophageal reflux disease)   . Headache     migraines  . History of cardiovascular stress test 11/2017   [facility]) negative dobutamine stress test  . Hyperlipidemia   . Hypertension   . Legally blind    right  . Myocardial infarction [facility])    patient states it was seen on EKG, denies a cardiac cath    Past Surgical History:  Procedure Laterality Date  . A/V FISTULAGRAM Right 07/22/2018   Procedure: A/V FISTULAGRAM;  Surgeon: Serafina Mitchell, MD;  Location: [place] [facility];  Service: Cardiovascular;  Laterality: Right;  . AV FISTULA PLACEMENT Right   . AV FISTULA PLACEMENT Right [id number]   Procedure: PLICATION OF RIGHT arm FISTULA;  Surgeon: Waynetta Sandy, MD;  Location: [place];  Service: Vascular;  Laterality: Right;  . BASCILIC VEIN TRANSPOSITION Left 02/05/2019   Procedure: BRACHIOCEPHALIC FISTULA  LEFT ARM;  Surgeon: Serafina Mitchell, MD;  [facility];  Service: Vascular;  Laterality: Left;  . COLONOSCOPY    . diabetic cyst removal Bilateral    on buttocks x8  . EYE SURGERY    .  FISTULA SUPERFICIALIZATION Right [id number]   Procedure: PLICATION OF ARTERIOVENOUS FISTULA RIGHT ARM;  Surgeon: Conrad Kress, MD;  Location: [place];  Service: Vascular;  Laterality: Right;  . 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  . lens replacement Left   . LIGATION OF ARTERIOVENOUS  FISTULA  03/19/2019   Procedure: LIGATION OF ARTERIOVENOUS  OF COMPETING BRANCHES OF FISTULA LEFT UPPER ARM;  Surgeon: Serafina Mitchell, MD;  Location[facility];  Service: Vascular;;  . LIGATION OF ARTERIOVENOUS  FISTULA Right 05/07/2019   Procedure: LIGATION OF ARTERIOVENOUS  FISTULA RIGHT ARM;  Surgeon: Serafina Mitchell, MD;  Location: [place];  Service: Vascular;  Laterality: Right;  . NEPHRECTOMY TRANSPLANTED ORGAN    . PERIPHERAL VASCULAR BALLOON ANGIOPLASTY  07/22/2018   Procedure: PERIPHERAL VASCULAR BALLOON ANGIOPLASTY;  Surgeon: Serafina Mitchell, MD;  Location: [place] [facility];  Service: Cardiovascular;;  Right [place] fistula   . PERIPHERAL  VASCULAR BALLOON ANGIOPLASTY Right 02/03/2019   Procedure: PERIPHERAL VASCULAR BALLOON ANGIOPLASTY;  Surgeon: Serafina Mitchell, MD;  Location: [place] [facility];  Service: Cardiovascular;  Laterality: Right;  RUE AVF  . REFRACTIVE SURGERY Bilateral      HPI  from the history and physical done on the day of admission:    Chief Complaint: Cough, diarrhea  HPI: Alejandro Lee is a 60 y.o. male with medical history significant for  ESRD on HD, failed renal transplant , diastolic CHF, HTN, DM, MI. Patient presented to the ED with complaints of intermittent fever, diarrhea, cough that started 3 days ago.  Reports 3 episodes of vomiting.  Reports baseline chronic diarrhea that is unchanged.   He denies difficulty breathing. Vaccinated for [employer].  ED Course: Temp 101.6, O2 sats 93% on room air.  Elevation in inflammatory markers including D-dimer 5.09. Port Chest-interstitial prominence greatest at the lung bases which could reflect mild atypical pneumonia.  Remdesivir and steroids started hospitalist admit for further evaluation and management.  Review of Systems: As per HPI all other systems reviewed and negative      Hospital Course:     Brief Admission History:  60 y/o male with ESRD on HD, failed renal transplant, diastolic CHF, HTN, type 2 DM, CAD s/p MI, chronic diarrhea, present to ED with complaints of 3 days of cough, malaise, fever, emesis.  He received 2 Covid vaccines.  He tested positive for Covid infection.  He had abnormal CXR and elevated procalcitonin .  He was admitted for further management.    Assessment & Plan:   Principal Problem:   Pneumonia due to COVID-19 virus Active Problems:   Diabetes mellitus with end-stage renal disease ([place])   ESRD (end stage renal disease) on dialysis ([facility])   Hypertension   Chronic diastolic HF (heart failure) ([facility])   Benign essential HTN  1. Covid pneumonia  -telemetry with IV remdesivir and steroids, okay to discharge home on  p.o. prednisone -Respiratory status has improved significantly -Doxycycline was added for possible concomitant bacterial bronchitis  2. ESRD on HD -patient had hemodialysis on 09/06/2020, continue hemodialysis as outpatient as advised   3. Essential hypertension -resume home medication.   4. Type 2 diabetes -resume home insulin regimen  5)HTN-continue amlodipine   Code Status: full   Disposition:  home  Discharge Condition: stable  Follow UP--- follow-up with nephrologist for HD and PCP for ongoing management   Diet and Activity recommendation:  As advised  Discharge Instructions    Discharge Instructions  Call MD for:  difficulty breathing, headache or visual disturbances   Complete by: As directed    Call MD for:  persistant dizziness or light-headedness   Complete by: As directed    Call MD for:  persistant nausea and vomiting   Complete by: As directed    Call MD for:  temperature >100.4   Complete by: As directed    Diet - low sodium heart healthy   Complete by: As directed    Diet Carb Modified   Complete by: As directed    Discharge instructions   Complete by: As directed    1)You are strongly advised to  to isolate/quarantine for at least 21 days from the date of your diagnoses with COVID-19 infection--please always wear a mask if you have to go outside the house  2)Avoid ibuprofen/Advil/Aleve/Motrin/Goody Powders/Naproxen/BC powders/Meloxicam/Diclofenac/Indomethacin and other Nonsteroidal anti-inflammatory medications as these will make you more likely to bleed and can cause stomach ulcers, can also cause Kidney problems.   3)Virtual/Video Visit with Monico Blitz, MD within 1 week --  4)Very low-salt diet advised  5)Weigh yourself daily, call if you gain more than 3 pounds in 1 day or more than 5 pounds in 1 week as your hemodialysis dry weight and schedule may have to be adjusted  6)Limit your Fluid  intake to no more than 50 ounces (1.5 Liters) per day    Increase activity slowly   Complete by: As directed         Discharge Medications     Allergies as of 09/07/2020   No Known Allergies     Medication List    TAKE these medications   acetaminophen 500 MG tablet Commonly known as: TYLENOL Take 1,000 mg by mouth every 6 (six) hours as needed for moderate pain.   albuterol 108 (90 Base) MCG/ACT inhaler Commonly known as: VENTOLIN HFA Inhale 2 puffs into the lungs every 6 (six) hours as needed for wheezing or shortness of breath.   amLODipine 10 MG tablet Commonly known as: NORVASC Take 1 tablet (10 mg total) by mouth daily.   ascorbic acid 500 MG tablet Commonly known as: VITAMIN C Take 1 tablet (500 mg total) by mouth daily. Start taking on: September 08, 2020   azaTHIOprine 50 MG tablet Commonly known as: IMURAN Take 1 tablet (50 mg total) by mouth daily.   doxycycline 100 MG tablet Commonly known as: VIBRA-TABS Take 1 tablet (100 mg total) by mouth 2 (two) times daily for 7 days.   guaiFENesin-dextromethorphan 100-10 MG/5ML syrup Commonly known as: ROBITUSSIN DM Take 10 mLs by mouth every 4 (four) hours as needed for cough.   labetalol 200 MG tablet Commonly known as: NORMODYNE Take 1 tablet (200 mg total) by mouth 3 (three) times daily.   Lantus SoloStar 100 UNIT/ML Solostar Pen Generic drug: insulin glargine Inject 40 Units into the skin at bedtime. What changed: how much to take   NovoLOG FlexPen 100 UNIT/ML FlexPen Generic drug: insulin aspart Inject 8-14 Units into the skin 3 (three) times daily before meals. What changed: how much to take   pantoprazole 40 MG tablet Commonly known as: PROTONIX Take 40 mg by mouth daily.   predniSONE 20 MG tablet Commonly known as: DELTASONE Take 2 tablets (40 mg total) by mouth daily with breakfast for 5 days.   Tacrolimus ER 4 MG Tb24 Take 16 mg by mouth daily in the afternoon.   zinc sulfate 220 (50 Zn) MG capsule Take 1 capsule (220 mg  total) by mouth  daily. Start taking on: September 08, 2020       Major procedures and Radiology Reports - PLEASE review detailed and final reports for all details, in brief -    DG Chest Port 1 View  Result Date: 09/05/2020 CLINICAL DATA:  Fever, cough, diarrhea and vomiting for 3 days, COVID-19 positive EXAM: PORTABLE CHEST 1 VIEW COMPARISON:  08/08/2019 FINDINGS: Single frontal view of the chest demonstrates an unremarkable cardiac silhouette. Mild increased interstitial opacities most pronounced at the lung bases, without airspace disease, effusion, or pneumothorax. No acute bony abnormalities. IMPRESSION: 1. Interstitial prominence greatest at the lung bases, which could reflect mild atypical pneumonia. No acute airspace disease. Electronically Signed   By: Randa Ngo M.D.   On: 09/05/2020 15:46   Micro Results   Recent Results (from the past 240 hour(s))  Blood Culture (routine x 2)     Status: None (Preliminary result)   Collection Time: 09/05/20  4:32 PM   Specimen: Right Antecubital; Blood  Result Value Ref Range Status   Specimen Description RIGHT ANTECUBITAL  Final   Special Requests   Final    BOTTLES DRAWN AEROBIC AND ANAEROBIC Blood Culture adequate volume   Culture   Final    NO GROWTH 2 DAYS Performed at [facility], [address], [place], [place] [address]    Report Status PENDING  Incomplete  Blood Culture (routine x 2)     Status: None (Preliminary result)   Collection Time: 09/05/20  4:32 PM   Specimen: BLOOD RIGHT ARM  Result Value Ref Range Status   Specimen Description BLOOD RIGHT ARM  Final   Special Requests   Final    BOTTLES DRAWN AEROBIC AND ANAEROBIC Blood Culture adequate volume   Culture   Final    NO GROWTH 2 DAYS Performed at [facility], [address] [place], [place] [address]    Report Status PENDING  Incomplete       Today   Subjective    Alejandro Lee today has no new complaints  No Nausea, Vomiting or Diarrhea  No fever  Or chills          Patient has been seen and examined prior to discharge   Objective   Blood pressure (!) 178/87, pulse 71, temperature 98.7 F (37.1 C), resp. rate 18, height '5\' 7"'$  (1.702 m), weight 87.6 kg, SpO2 97 %.   Intake/Output Summary (Last 24 hours) at 09/07/2020 1358 Last data filed at 09/07/2020 1259 Gross per 24 hour  Intake 100 ml  Output 2613 ml  Net -2513 ml    Exam Gen:- Awake Alert, no acute distress  HEENT:- [place].AT, No sclera icterus Neck-Supple Neck,No JVD,.  Lungs-improved air movement, no wheezing CV- S1, S2 normal, regular Abd-  +ve B.Sounds, Abd Soft, No tenderness,    Extremity/Skin:-   good pulses, LUE AVF~with bruit and thrill Psych-affect is appropriate, oriented x3 Neuro-no new focal deficits, no tremors    Data Review   CBC w Diff:  Lab Results  Component Value Date   WBC 3.1 (L) 09/07/2020   HGB 11.8 (L) 09/07/2020   HCT 38.6 (L) 09/07/2020   PLT 150 09/07/2020   LYMPHOPCT 9 09/07/2020   MONOPCT 10 09/07/2020   EOSPCT 0 09/07/2020   BASOPCT 0 09/07/2020    CMP:  Lab Results  Component Value Date   NA 133 (L) 09/07/2020   K 3.9 09/07/2020   CL 101 09/07/2020   CO2 18 (L) 09/07/2020  BUN 63 (H) 09/07/2020   CREATININE 6.58 (H) 09/07/2020   PROT 7.1 09/07/2020   ALBUMIN 3.3 (L) 09/07/2020   BILITOT 0.5 09/07/2020   ALKPHOS 91 09/07/2020   AST 22 09/07/2020   ALT 11 09/07/2020  .   Total Discharge time is about 33 minutes  Roxan Hockey M.D on 09/07/2020 at 1:58 PM  Go to [email or web address] -  for contact info  [facility] - Office  [phone]

## 2020-09-07 NOTE — Discharge Instructions (Signed)
1)You are strongly advised to  to isolate/quarantine for at least 21 days from the date of your diagnoses with COVID-19 infection--please always wear a mask if you have to go outside the house  2)Avoid ibuprofen/Advil/Aleve/Motrin/Goody Powders/Naproxen/BC powders/Meloxicam/Diclofenac/Indomethacin and other Nonsteroidal anti-inflammatory medications as these will make you more likely to bleed and can cause stomach ulcers, can also cause Kidney problems.   3)Virtual/Video Visit with Monico Blitz, MD within 1 week --  4)Very low-salt diet advised  5)Weigh yourself daily, call if you gain more than 3 pounds in 1 day or more than 5 pounds in 1 week as your hemodialysis dry weight and schedule may have to be adjusted  6)Limit your Fluid  intake to no more than 50 ounces (1.5 Liters) per day

## 2020-09-07 NOTE — Progress Notes (Signed)
Patient ID: Alejandro Lee, male   DOB: October 15, 1960, 60 y.o.   MRN: [id number] S:Feeling much better this morning.  Improved SOB and diarrhea. O:BP (!) 178/87   Pulse 71   Temp 98.7 F (37.1 C)   Resp 18   Ht '5\' 7"'$  (1.702 m)   Wt 87.6 kg   SpO2 97%   BMI 30.25 kg/m   Intake/Output Summary (Last 24 hours) at 09/07/2020 0958 Last data filed at 09/07/2020 0204 Gross per 24 hour  Intake --  Output 2613 ml  Net -2613 ml   Intake/Output: I/O last 3 completed shifts: In: -  Out: 2613 [Urine:500; Other:2113]  Intake/Output this shift:  No intake/output data recorded. Weight change: 1.417 kg Gen:NAD CVS: RRR Resp: CTA Abd: benign Ext: no edema, LUE AVF +T/B  Recent Labs  Lab 09/05/20 1632 09/06/20 0642 09/07/20 0600  NA 136 136 133*  K 4.3 5.0 3.9  CL 104 103 101  CO2 18* 17* 18*  GLUCOSE 93 256* 193*  BUN 65* 78* 63*  CREATININE 9.24* 9.46* 6.58*  ALBUMIN 3.3* 3.1* 3.3*  CALCIUM 8.5* 8.4* 8.8*  PHOS  --  7.4* 5.6*  AST '21 18 22  '$ ALT '10 10 11   '$ Liver Function Tests: Recent Labs  Lab 09/05/20 1632 09/06/20 0642 09/07/20 0600  AST '21 18 22  '$ ALT '10 10 11  '$ ALKPHOS 93 88 91  BILITOT 0.7 0.6 0.5  PROT 7.0 6.6 7.1  ALBUMIN 3.3* 3.1* 3.3*   No results for input(s): LIPASE, AMYLASE in the last 168 hours. No results for input(s): AMMONIA in the last 168 hours. CBC: Recent Labs  Lab 09/05/20 1632 09/06/20 0642 09/07/20 0600  WBC 1.5* 0.9* 3.1*  NEUTROABS 1.1* 0.5* 2.5  HGB 11.2* 10.9* 11.8*  HCT 38.5* 37.5* 38.6*  MCV 80.4 80.1 75.5*  PLT 114* 106* 150   Cardiac Enzymes: No results for input(s): CKTOTAL, CKMB, CKMBINDEX, TROPONINI in the last 168 hours. CBG: Recent Labs  Lab 09/06/20 1318 09/06/20 1704 09/06/20 1922 09/06/20 2125 09/07/20 0928  GLUCAP 242* 243* 250* 227* 215*    Iron Studies:  Recent Labs    09/07/20 0600  FERRITIN 1,622*   Studies/Results: DG Chest Port 1 View  Result Date: 09/05/2020 CLINICAL DATA:  Fever, cough, diarrhea  and vomiting for 3 days, COVID-19 positive EXAM: PORTABLE CHEST 1 VIEW COMPARISON:  08/08/2019 FINDINGS: Single frontal view of the chest demonstrates an unremarkable cardiac silhouette. Mild increased interstitial opacities most pronounced at the lung bases, without airspace disease, effusion, or pneumothorax. No acute bony abnormalities. IMPRESSION: 1. Interstitial prominence greatest at the lung bases, which could reflect mild atypical pneumonia. No acute airspace disease. Electronically Signed   By: Randa Ngo M.D.   On: 09/05/2020 15:46   . amLODipine  10 mg Oral Daily  . vitamin C  500 mg Oral Daily  . Chlorhexidine Gluconate Cloth  6 each Topical Q0600  . dexamethasone (DECADRON) injection  6 mg Intravenous Q24H  . doxycycline  100 mg Oral BID  . heparin  2,600 Units Dialysis Once in dialysis  . heparin  5,000 Units Subcutaneous Q8H  . insulin aspart  0-15 Units Subcutaneous TID WC  . insulin aspart  0-5 Units Subcutaneous QHS  . insulin aspart  8 Units Subcutaneous TID WC  . insulin glargine  30 Units Subcutaneous QHS  . labetalol  200 mg Oral TID  . pantoprazole  40 mg Oral Daily  . zinc sulfate  220 mg Oral Daily  BMET    Component Value Date/Time   NA 133 (L) 09/07/2020 0600   K 3.9 09/07/2020 0600   CL 101 09/07/2020 0600   CO2 18 (L) 09/07/2020 0600   GLUCOSE 193 (H) 09/07/2020 0600   BUN 63 (H) 09/07/2020 0600   CREATININE 6.58 (H) 09/07/2020 0600   CALCIUM 8.8 (L) 09/07/2020 0600   GFRNONAA 9 (L) 09/07/2020 0600   GFRAA 14 (L) 08/08/2019 1623   CBC    Component Value Date/Time   WBC 3.1 (L) 09/07/2020 0600   RBC 5.11 09/07/2020 0600   HGB 11.8 (L) 09/07/2020 0600   HCT 38.6 (L) 09/07/2020 0600   PLT 150 09/07/2020 0600   MCV 75.5 (L) 09/07/2020 0600   MCH 23.1 (L) 09/07/2020 0600   MCHC 30.6 09/07/2020 0600   RDW 16.1 (H) 09/07/2020 0600   LYMPHSABS 0.3 (L) 09/07/2020 0600   MONOABS 0.3 09/07/2020 0600   EOSABS 0.0 09/07/2020 0600   BASOSABS 0.0  09/07/2020 0600    Dialysis Orders: Center: [facility]  on MWF . EDW 88kg HD Bath 2K/2.5Ca  Time 4 hours Heparin 2000 units bolus then 1000 units/hr. Access LUE AVF BFR 350 DFR 500    Epogen 2600   Units IV/HD  Venofer  50 mg IV weekly    Assessment/Plan: 1.  Pneumonia due to covid-19 virus- mild symptoms, mainly cough without SOB.  On IV dexamethasone and remdesivir x 3 per primary svc. 2.  ESRD -  tolerated HD well on 09/06/20.  Will get back on schedule Friday, hopefully as an outpatient in his home unit. 3.  Hypertension/volume  - stable 4.  Anemia  - stable 5.  Metabolic bone disease -  Continue with outpatient meds 6.  Nutrition - renal diet, carb modified 7. 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  Does not need to resume especially in light of covid-19 infection. 8. DM- per primary svc 9. Disposition- plan discussed with Dr. Denton Brick and agree with 3 days of IV remdesivir then discharge to home tomorrow and follow up with his outpatient HD unit on Friday.  Pt is amenable to plan.    Donetta Potts, MD [employer] [phone]

## 2020-09-07 NOTE — TOC Transition Note (Signed)
Transition of Care [facility]) - CM/SW Discharge Note   Patient Details  Name: RHYLIN NOSKA MRN: [id number] Date of Birth: 05/28/1961  Transition of Care [facility]) CM/SW Contact:  Salome Arnt, LCSW Phone Number: 09/07/2020, 1:49 PM   Clinical Narrative:  Pt d/c today. LCSW spoke with Theressa Stamps who states pt will need to arrive tomorrow at 3:00 for dialysis. Pt cannot enter the building until after last pt leaves previous shift. LCSW notified pt of above and he understands he is to call before entering. Pt aware he cannot use [facility] due to COVID + and will ask his wife to transport.      Final next level of care: Home/Self Care Barriers to Discharge: Barriers Resolved   Patient Goals and CMS Choice Patient states their goals for this hospitalization and ongoing recovery are:: return home      Discharge Placement                       Discharge Plan and Services                DME Arranged: N/A                    Social Determinants of Health (SDOH) Interventions     Readmission Risk Interventions No flowsheet data found.

## 2020-09-10 LAB — CULTURE, BLOOD (ROUTINE X 2)
Culture: NO GROWTH
Culture: NO GROWTH
Special Requests: ADEQUATE
Special Requests: ADEQUATE

## 2020-09-12 DIAGNOSIS — Z992 Dependence on renal dialysis: Secondary | ICD-10-CM | POA: Diagnosis not present

## 2020-09-12 DIAGNOSIS — N186 End stage renal disease: Secondary | ICD-10-CM | POA: Diagnosis not present

## 2020-09-12 DIAGNOSIS — I10 Essential (primary) hypertension: Secondary | ICD-10-CM | POA: Diagnosis not present

## 2020-09-15 DIAGNOSIS — D631 Anemia in chronic kidney disease: Secondary | ICD-10-CM | POA: Diagnosis not present

## 2020-09-15 DIAGNOSIS — N2581 Secondary hyperparathyroidism of renal origin: Secondary | ICD-10-CM | POA: Diagnosis not present

## 2020-09-15 DIAGNOSIS — Z992 Dependence on renal dialysis: Secondary | ICD-10-CM | POA: Diagnosis not present

## 2020-09-15 DIAGNOSIS — D509 Iron deficiency anemia, unspecified: Secondary | ICD-10-CM | POA: Diagnosis not present

## 2020-09-15 DIAGNOSIS — N186 End stage renal disease: Secondary | ICD-10-CM | POA: Diagnosis not present

## 2020-09-26 DIAGNOSIS — Z992 Dependence on renal dialysis: Secondary | ICD-10-CM | POA: Diagnosis not present

## 2020-10-10 DIAGNOSIS — Z992 Dependence on renal dialysis: Secondary | ICD-10-CM | POA: Diagnosis not present

## 2020-10-10 DIAGNOSIS — N186 End stage renal disease: Secondary | ICD-10-CM | POA: Diagnosis not present

## 2020-10-10 DIAGNOSIS — I10 Essential (primary) hypertension: Secondary | ICD-10-CM | POA: Diagnosis not present

## 2020-10-17 DIAGNOSIS — N2581 Secondary hyperparathyroidism of renal origin: Secondary | ICD-10-CM | POA: Diagnosis not present

## 2020-10-17 DIAGNOSIS — D631 Anemia in chronic kidney disease: Secondary | ICD-10-CM | POA: Diagnosis not present

## 2020-10-17 DIAGNOSIS — D509 Iron deficiency anemia, unspecified: Secondary | ICD-10-CM | POA: Diagnosis not present

## 2020-10-17 DIAGNOSIS — Z992 Dependence on renal dialysis: Secondary | ICD-10-CM | POA: Diagnosis not present

## 2020-10-17 DIAGNOSIS — N186 End stage renal disease: Secondary | ICD-10-CM | POA: Diagnosis not present

## 2020-10-24 DIAGNOSIS — Z992 Dependence on renal dialysis: Secondary | ICD-10-CM | POA: Diagnosis not present

## 2020-11-10 DIAGNOSIS — I10 Essential (primary) hypertension: Secondary | ICD-10-CM | POA: Diagnosis not present

## 2020-11-10 DIAGNOSIS — Z992 Dependence on renal dialysis: Secondary | ICD-10-CM | POA: Diagnosis not present

## 2020-11-10 DIAGNOSIS — N186 End stage renal disease: Secondary | ICD-10-CM | POA: Diagnosis not present

## 2020-11-14 DIAGNOSIS — Z992 Dependence on renal dialysis: Secondary | ICD-10-CM | POA: Diagnosis not present

## 2020-11-14 DIAGNOSIS — D509 Iron deficiency anemia, unspecified: Secondary | ICD-10-CM | POA: Diagnosis not present

## 2020-11-14 DIAGNOSIS — N2581 Secondary hyperparathyroidism of renal origin: Secondary | ICD-10-CM | POA: Diagnosis not present

## 2020-11-14 DIAGNOSIS — N186 End stage renal disease: Secondary | ICD-10-CM | POA: Diagnosis not present

## 2020-11-14 DIAGNOSIS — D631 Anemia in chronic kidney disease: Secondary | ICD-10-CM | POA: Diagnosis not present

## 2020-11-14 DIAGNOSIS — Z23 Encounter for immunization: Secondary | ICD-10-CM | POA: Diagnosis not present

## 2020-11-28 DIAGNOSIS — E119 Type 2 diabetes mellitus without complications: Secondary | ICD-10-CM | POA: Diagnosis not present

## 2020-11-28 DIAGNOSIS — Z992 Dependence on renal dialysis: Secondary | ICD-10-CM | POA: Diagnosis not present

## 2020-11-28 DIAGNOSIS — Z794 Long term (current) use of insulin: Secondary | ICD-10-CM | POA: Diagnosis not present

## 2020-11-30 DIAGNOSIS — Z7189 Other specified counseling: Secondary | ICD-10-CM | POA: Diagnosis not present

## 2020-11-30 DIAGNOSIS — Z299 Encounter for prophylactic measures, unspecified: Secondary | ICD-10-CM | POA: Diagnosis not present

## 2020-11-30 DIAGNOSIS — Z87891 Personal history of nicotine dependence: Secondary | ICD-10-CM | POA: Diagnosis not present

## 2020-11-30 DIAGNOSIS — Z0000 Encounter for general adult medical examination without abnormal findings: Secondary | ICD-10-CM | POA: Diagnosis not present

## 2020-11-30 DIAGNOSIS — Z1339 Encounter for screening examination for other mental health and behavioral disorders: Secondary | ICD-10-CM | POA: Diagnosis not present

## 2020-11-30 DIAGNOSIS — E109 Type 1 diabetes mellitus without complications: Secondary | ICD-10-CM | POA: Diagnosis not present

## 2020-11-30 DIAGNOSIS — Z79899 Other long term (current) drug therapy: Secondary | ICD-10-CM | POA: Diagnosis not present

## 2020-11-30 DIAGNOSIS — Z6830 Body mass index (BMI) 30.0-30.9, adult: Secondary | ICD-10-CM | POA: Diagnosis not present

## 2020-11-30 DIAGNOSIS — E1165 Type 2 diabetes mellitus with hyperglycemia: Secondary | ICD-10-CM | POA: Diagnosis not present

## 2020-11-30 DIAGNOSIS — R5383 Other fatigue: Secondary | ICD-10-CM | POA: Diagnosis not present

## 2020-11-30 DIAGNOSIS — N186 End stage renal disease: Secondary | ICD-10-CM | POA: Diagnosis not present

## 2020-11-30 DIAGNOSIS — E7800 Pure hypercholesterolemia, unspecified: Secondary | ICD-10-CM | POA: Diagnosis not present

## 2020-11-30 DIAGNOSIS — E113599 Type 2 diabetes mellitus with proliferative diabetic retinopathy without macular edema, unspecified eye: Secondary | ICD-10-CM | POA: Diagnosis not present

## 2020-11-30 DIAGNOSIS — Z1331 Encounter for screening for depression: Secondary | ICD-10-CM | POA: Diagnosis not present

## 2020-11-30 DIAGNOSIS — Z125 Encounter for screening for malignant neoplasm of prostate: Secondary | ICD-10-CM | POA: Diagnosis not present

## 2020-11-30 DIAGNOSIS — I10 Essential (primary) hypertension: Secondary | ICD-10-CM | POA: Diagnosis not present

## 2020-12-01 ENCOUNTER — Other Ambulatory Visit: Payer: Self-pay | Admitting: "Endocrinology

## 2020-12-08 DIAGNOSIS — I10 Essential (primary) hypertension: Secondary | ICD-10-CM | POA: Diagnosis not present

## 2020-12-10 DIAGNOSIS — N186 End stage renal disease: Secondary | ICD-10-CM | POA: Diagnosis not present

## 2020-12-10 DIAGNOSIS — Z992 Dependence on renal dialysis: Secondary | ICD-10-CM | POA: Diagnosis not present

## 2020-12-16 DIAGNOSIS — D509 Iron deficiency anemia, unspecified: Secondary | ICD-10-CM | POA: Diagnosis not present

## 2020-12-16 DIAGNOSIS — Z992 Dependence on renal dialysis: Secondary | ICD-10-CM | POA: Diagnosis not present

## 2020-12-16 DIAGNOSIS — D631 Anemia in chronic kidney disease: Secondary | ICD-10-CM | POA: Diagnosis not present

## 2020-12-16 DIAGNOSIS — N186 End stage renal disease: Secondary | ICD-10-CM | POA: Diagnosis not present

## 2020-12-16 DIAGNOSIS — N2581 Secondary hyperparathyroidism of renal origin: Secondary | ICD-10-CM | POA: Diagnosis not present

## 2020-12-19 DIAGNOSIS — Z992 Dependence on renal dialysis: Secondary | ICD-10-CM | POA: Diagnosis not present

## 2021-01-10 DIAGNOSIS — Z992 Dependence on renal dialysis: Secondary | ICD-10-CM | POA: Diagnosis not present

## 2021-01-10 DIAGNOSIS — I10 Essential (primary) hypertension: Secondary | ICD-10-CM | POA: Diagnosis not present

## 2021-01-10 DIAGNOSIS — N186 End stage renal disease: Secondary | ICD-10-CM | POA: Diagnosis not present

## 2021-01-13 DIAGNOSIS — N186 End stage renal disease: Secondary | ICD-10-CM | POA: Diagnosis not present

## 2021-01-13 DIAGNOSIS — D509 Iron deficiency anemia, unspecified: Secondary | ICD-10-CM | POA: Diagnosis not present

## 2021-01-13 DIAGNOSIS — N2581 Secondary hyperparathyroidism of renal origin: Secondary | ICD-10-CM | POA: Diagnosis not present

## 2021-01-13 DIAGNOSIS — Z992 Dependence on renal dialysis: Secondary | ICD-10-CM | POA: Diagnosis not present

## 2021-01-13 DIAGNOSIS — D631 Anemia in chronic kidney disease: Secondary | ICD-10-CM | POA: Diagnosis not present

## 2021-01-27 DIAGNOSIS — Z992 Dependence on renal dialysis: Secondary | ICD-10-CM | POA: Diagnosis not present

## 2021-01-31 DIAGNOSIS — N186 End stage renal disease: Secondary | ICD-10-CM | POA: Diagnosis not present

## 2021-01-31 DIAGNOSIS — I10 Essential (primary) hypertension: Secondary | ICD-10-CM | POA: Diagnosis not present

## 2021-01-31 DIAGNOSIS — Z6830 Body mass index (BMI) 30.0-30.9, adult: Secondary | ICD-10-CM | POA: Diagnosis not present

## 2021-01-31 DIAGNOSIS — E109 Type 1 diabetes mellitus without complications: Secondary | ICD-10-CM | POA: Diagnosis not present

## 2021-01-31 DIAGNOSIS — Z299 Encounter for prophylactic measures, unspecified: Secondary | ICD-10-CM | POA: Diagnosis not present

## 2021-02-09 DIAGNOSIS — Z992 Dependence on renal dialysis: Secondary | ICD-10-CM | POA: Diagnosis not present

## 2021-02-09 DIAGNOSIS — N186 End stage renal disease: Secondary | ICD-10-CM | POA: Diagnosis not present

## 2021-02-09 DIAGNOSIS — I10 Essential (primary) hypertension: Secondary | ICD-10-CM | POA: Diagnosis not present

## 2021-02-13 DIAGNOSIS — N2581 Secondary hyperparathyroidism of renal origin: Secondary | ICD-10-CM | POA: Diagnosis not present

## 2021-02-13 DIAGNOSIS — D631 Anemia in chronic kidney disease: Secondary | ICD-10-CM | POA: Diagnosis not present

## 2021-02-13 DIAGNOSIS — D509 Iron deficiency anemia, unspecified: Secondary | ICD-10-CM | POA: Diagnosis not present

## 2021-02-13 DIAGNOSIS — N186 End stage renal disease: Secondary | ICD-10-CM | POA: Diagnosis not present

## 2021-02-13 DIAGNOSIS — Z992 Dependence on renal dialysis: Secondary | ICD-10-CM | POA: Diagnosis not present

## 2021-02-20 DIAGNOSIS — Z992 Dependence on renal dialysis: Secondary | ICD-10-CM | POA: Diagnosis not present

## 2021-02-20 DIAGNOSIS — Z794 Long term (current) use of insulin: Secondary | ICD-10-CM | POA: Diagnosis not present

## 2021-02-20 DIAGNOSIS — E119 Type 2 diabetes mellitus without complications: Secondary | ICD-10-CM | POA: Diagnosis not present

## 2021-03-09 IMAGING — DX DG CHEST 1V PORT
1 series · 1 of 1 positions shown · non-contrast
Comparison: 08/08/2019

CLINICAL DATA: Fever, cough, diarrhea and vomiting for 3 days,
[id number] positive

EXAM:
PORTABLE CHEST 1 VIEW

[chest ap]
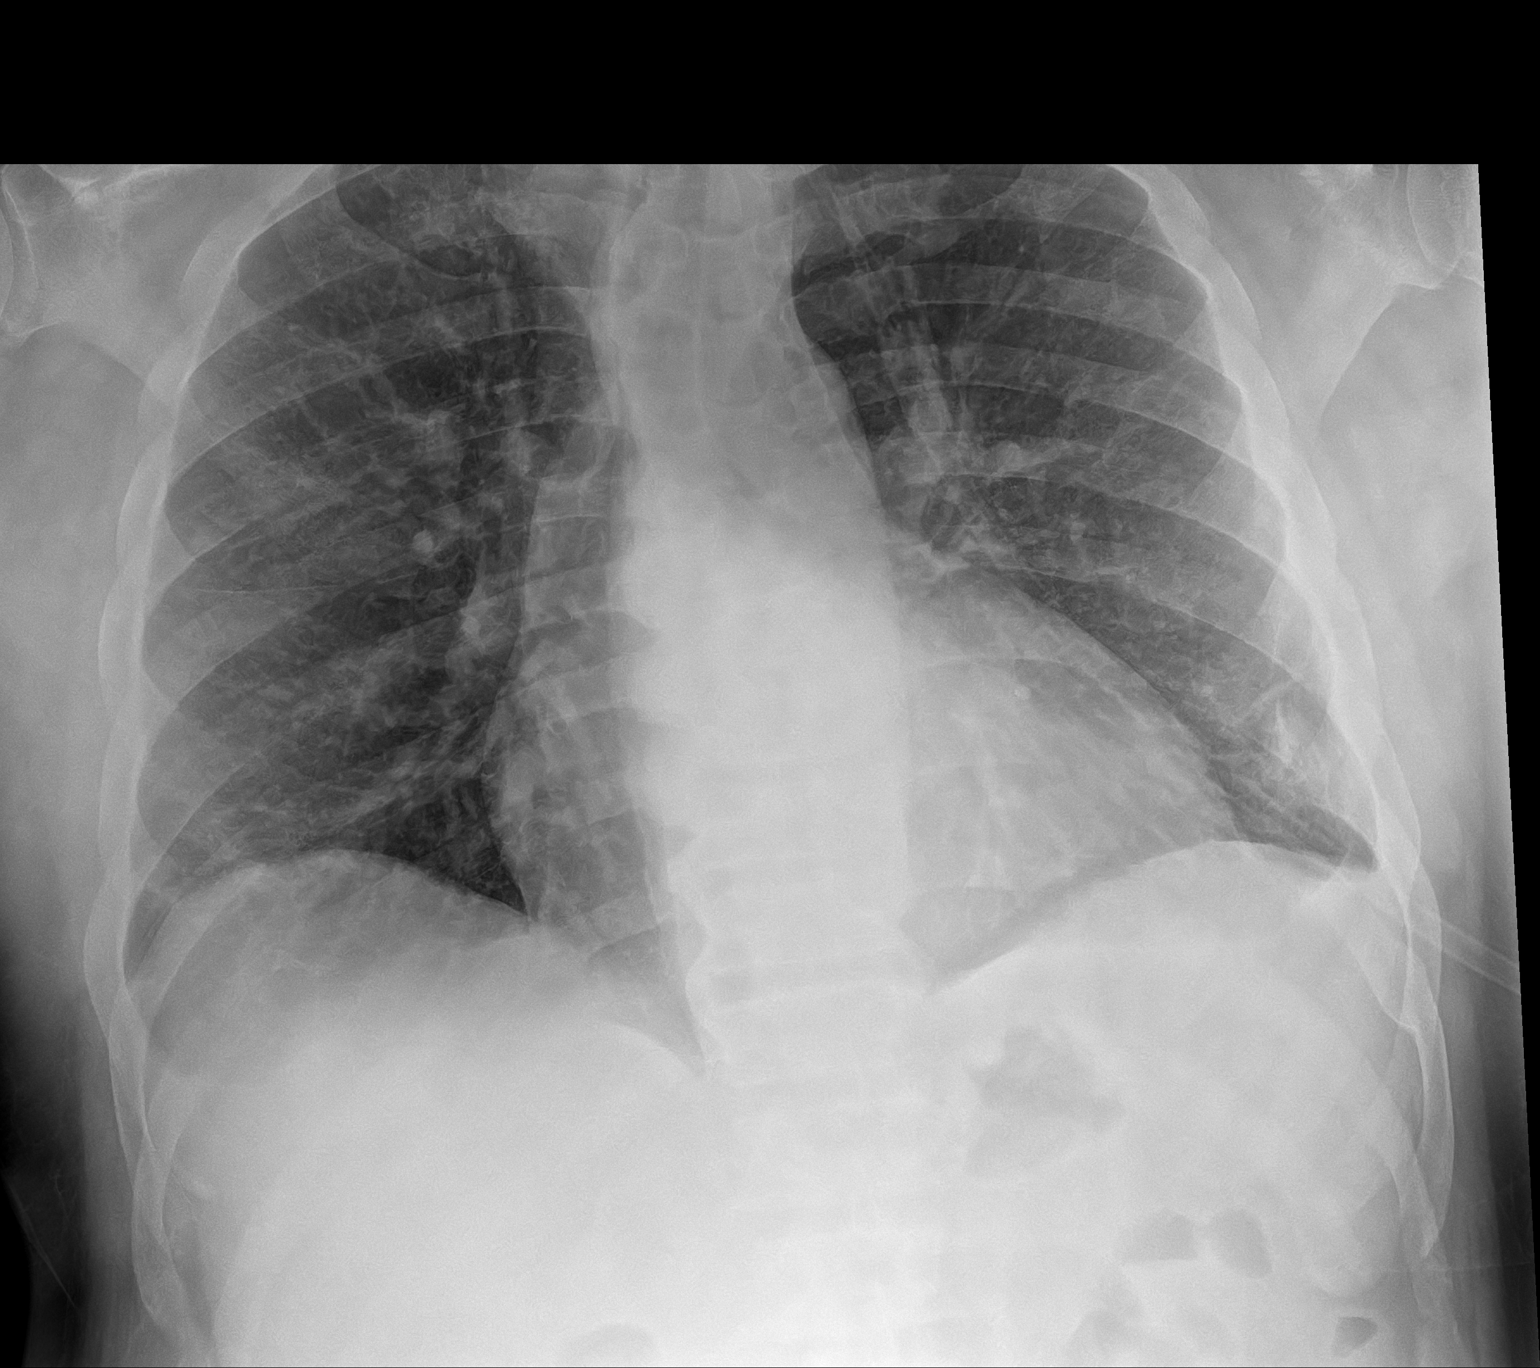

[1 of 1 positions shown; findings below may reference images not displayed]

FINDINGS: Single frontal view of the chest demonstrates an unremarkable
cardiac silhouette. Mild increased interstitial opacities most
pronounced at the lung bases, without airspace disease, effusion, or
pneumothorax. No acute bony abnormalities.
IMPRESSION: 1. Interstitial prominence greatest at the lung bases, which could
reflect mild atypical pneumonia. No acute airspace disease.

## 2021-03-10 DIAGNOSIS — I10 Essential (primary) hypertension: Secondary | ICD-10-CM | POA: Diagnosis not present

## 2021-03-12 DIAGNOSIS — N186 End stage renal disease: Secondary | ICD-10-CM | POA: Diagnosis not present

## 2021-03-12 DIAGNOSIS — Z992 Dependence on renal dialysis: Secondary | ICD-10-CM | POA: Diagnosis not present

## 2021-03-20 DIAGNOSIS — D509 Iron deficiency anemia, unspecified: Secondary | ICD-10-CM | POA: Diagnosis not present

## 2021-03-20 DIAGNOSIS — N2581 Secondary hyperparathyroidism of renal origin: Secondary | ICD-10-CM | POA: Diagnosis not present

## 2021-03-20 DIAGNOSIS — N186 End stage renal disease: Secondary | ICD-10-CM | POA: Diagnosis not present

## 2021-03-20 DIAGNOSIS — D631 Anemia in chronic kidney disease: Secondary | ICD-10-CM | POA: Diagnosis not present

## 2021-03-20 DIAGNOSIS — Z992 Dependence on renal dialysis: Secondary | ICD-10-CM | POA: Diagnosis not present

## 2021-03-24 DIAGNOSIS — Z992 Dependence on renal dialysis: Secondary | ICD-10-CM | POA: Diagnosis not present

## 2021-03-28 DIAGNOSIS — I10 Essential (primary) hypertension: Secondary | ICD-10-CM | POA: Diagnosis not present

## 2021-03-28 DIAGNOSIS — N186 End stage renal disease: Secondary | ICD-10-CM | POA: Diagnosis not present

## 2021-03-28 DIAGNOSIS — Z6830 Body mass index (BMI) 30.0-30.9, adult: Secondary | ICD-10-CM | POA: Diagnosis not present

## 2021-03-28 DIAGNOSIS — Z299 Encounter for prophylactic measures, unspecified: Secondary | ICD-10-CM | POA: Diagnosis not present

## 2021-03-28 DIAGNOSIS — Z992 Dependence on renal dialysis: Secondary | ICD-10-CM | POA: Diagnosis not present

## 2021-03-28 DIAGNOSIS — E1065 Type 1 diabetes mellitus with hyperglycemia: Secondary | ICD-10-CM | POA: Diagnosis not present

## 2021-04-12 DIAGNOSIS — N186 End stage renal disease: Secondary | ICD-10-CM | POA: Diagnosis not present

## 2021-04-12 DIAGNOSIS — I10 Essential (primary) hypertension: Secondary | ICD-10-CM | POA: Diagnosis not present

## 2021-04-12 DIAGNOSIS — Z992 Dependence on renal dialysis: Secondary | ICD-10-CM | POA: Diagnosis not present

## 2021-04-14 DIAGNOSIS — D631 Anemia in chronic kidney disease: Secondary | ICD-10-CM | POA: Diagnosis not present

## 2021-04-14 DIAGNOSIS — Z992 Dependence on renal dialysis: Secondary | ICD-10-CM | POA: Diagnosis not present

## 2021-04-14 DIAGNOSIS — D509 Iron deficiency anemia, unspecified: Secondary | ICD-10-CM | POA: Diagnosis not present

## 2021-04-14 DIAGNOSIS — N2581 Secondary hyperparathyroidism of renal origin: Secondary | ICD-10-CM | POA: Diagnosis not present

## 2021-04-14 DIAGNOSIS — N186 End stage renal disease: Secondary | ICD-10-CM | POA: Diagnosis not present

## 2021-04-24 DIAGNOSIS — Z992 Dependence on renal dialysis: Secondary | ICD-10-CM | POA: Diagnosis not present

## 2021-04-26 DIAGNOSIS — Z20822 Contact with and (suspected) exposure to covid-19: Secondary | ICD-10-CM | POA: Diagnosis not present

## 2021-04-26 DIAGNOSIS — U071 COVID-19: Secondary | ICD-10-CM | POA: Diagnosis not present

## 2021-04-27 DIAGNOSIS — N186 End stage renal disease: Secondary | ICD-10-CM | POA: Diagnosis not present

## 2021-04-27 DIAGNOSIS — U071 COVID-19: Secondary | ICD-10-CM | POA: Diagnosis not present

## 2021-04-27 DIAGNOSIS — Z6830 Body mass index (BMI) 30.0-30.9, adult: Secondary | ICD-10-CM | POA: Diagnosis not present

## 2021-04-27 DIAGNOSIS — Z992 Dependence on renal dialysis: Secondary | ICD-10-CM | POA: Diagnosis not present

## 2021-04-27 DIAGNOSIS — Z299 Encounter for prophylactic measures, unspecified: Secondary | ICD-10-CM | POA: Diagnosis not present

## 2021-05-02 ENCOUNTER — Encounter (HOSPITAL_COMMUNITY): Payer: Self-pay

## 2021-05-02 ENCOUNTER — Emergency Department (HOSPITAL_COMMUNITY): Payer: Medicare Other

## 2021-05-02 ENCOUNTER — Inpatient Hospital Stay (HOSPITAL_COMMUNITY)
Admission: EM | Admit: 2021-05-02 | Discharge: 2021-05-04 | DRG: 640 | Disposition: A | Discharge status: Home / Self Care | Payer: Medicare Other | Attending: Family Medicine | Admitting: Family Medicine

## 2021-05-02 ENCOUNTER — Other Ambulatory Visit: Payer: Self-pay

## 2021-05-02 DIAGNOSIS — E1122 Type 2 diabetes mellitus with diabetic chronic kidney disease: Secondary | ICD-10-CM | POA: Diagnosis not present

## 2021-05-02 DIAGNOSIS — M898X9 Other specified disorders of bone, unspecified site: Secondary | ICD-10-CM | POA: Diagnosis not present

## 2021-05-02 DIAGNOSIS — Z8249 Family history of ischemic heart disease and other diseases of the circulatory system: Secondary | ICD-10-CM | POA: Diagnosis not present

## 2021-05-02 DIAGNOSIS — Z87891 Personal history of nicotine dependence: Secondary | ICD-10-CM

## 2021-05-02 DIAGNOSIS — Z833 Family history of diabetes mellitus: Secondary | ICD-10-CM

## 2021-05-02 DIAGNOSIS — R0603 Acute respiratory distress: Secondary | ICD-10-CM | POA: Diagnosis not present

## 2021-05-02 DIAGNOSIS — E8770 Fluid overload, unspecified: Secondary | ICD-10-CM | POA: Diagnosis not present

## 2021-05-02 DIAGNOSIS — E875 Hyperkalemia: Secondary | ICD-10-CM | POA: Diagnosis present

## 2021-05-02 DIAGNOSIS — Z86718 Personal history of other venous thrombosis and embolism: Secondary | ICD-10-CM | POA: Diagnosis not present

## 2021-05-02 DIAGNOSIS — Z9115 Patient's noncompliance with renal dialysis: Secondary | ICD-10-CM

## 2021-05-02 DIAGNOSIS — I252 Old myocardial infarction: Secondary | ICD-10-CM

## 2021-05-02 DIAGNOSIS — H548 Legal blindness, as defined in USA: Secondary | ICD-10-CM | POA: Diagnosis not present

## 2021-05-02 DIAGNOSIS — G43909 Migraine, unspecified, not intractable, without status migrainosus: Secondary | ICD-10-CM | POA: Diagnosis not present

## 2021-05-02 DIAGNOSIS — Z83438 Family history of other disorder of lipoprotein metabolism and other lipidemia: Secondary | ICD-10-CM | POA: Diagnosis not present

## 2021-05-02 DIAGNOSIS — Z79899 Other long term (current) drug therapy: Secondary | ICD-10-CM

## 2021-05-02 DIAGNOSIS — I132 Hypertensive heart and chronic kidney disease with heart failure and with stage 5 chronic kidney disease, or end stage renal disease: Secondary | ICD-10-CM | POA: Diagnosis not present

## 2021-05-02 DIAGNOSIS — Z794 Long term (current) use of insulin: Secondary | ICD-10-CM

## 2021-05-02 DIAGNOSIS — I517 Cardiomegaly: Secondary | ICD-10-CM | POA: Diagnosis not present

## 2021-05-02 DIAGNOSIS — N2581 Secondary hyperparathyroidism of renal origin: Secondary | ICD-10-CM | POA: Diagnosis not present

## 2021-05-02 DIAGNOSIS — R0602 Shortness of breath: Secondary | ICD-10-CM | POA: Diagnosis not present

## 2021-05-02 DIAGNOSIS — E785 Hyperlipidemia, unspecified: Secondary | ICD-10-CM | POA: Diagnosis present

## 2021-05-02 DIAGNOSIS — Z823 Family history of stroke: Secondary | ICD-10-CM | POA: Diagnosis not present

## 2021-05-02 DIAGNOSIS — I10 Essential (primary) hypertension: Secondary | ICD-10-CM | POA: Diagnosis present

## 2021-05-02 DIAGNOSIS — N186 End stage renal disease: Secondary | ICD-10-CM | POA: Diagnosis present

## 2021-05-02 DIAGNOSIS — K219 Gastro-esophageal reflux disease without esophagitis: Secondary | ICD-10-CM | POA: Diagnosis not present

## 2021-05-02 DIAGNOSIS — E8779 Other fluid overload: Secondary | ICD-10-CM | POA: Diagnosis not present

## 2021-05-02 DIAGNOSIS — I509 Heart failure, unspecified: Secondary | ICD-10-CM | POA: Diagnosis not present

## 2021-05-02 DIAGNOSIS — Z809 Family history of malignant neoplasm, unspecified: Secondary | ICD-10-CM | POA: Diagnosis not present

## 2021-05-02 DIAGNOSIS — U071 COVID-19: Secondary | ICD-10-CM | POA: Diagnosis not present

## 2021-05-02 DIAGNOSIS — R0902 Hypoxemia: Secondary | ICD-10-CM | POA: Diagnosis not present

## 2021-05-02 DIAGNOSIS — Z992 Dependence on renal dialysis: Secondary | ICD-10-CM

## 2021-05-02 DIAGNOSIS — M1990 Unspecified osteoarthritis, unspecified site: Secondary | ICD-10-CM | POA: Diagnosis present

## 2021-05-02 DIAGNOSIS — N250 Renal osteodystrophy: Secondary | ICD-10-CM | POA: Diagnosis not present

## 2021-05-02 DIAGNOSIS — I160 Hypertensive urgency: Secondary | ICD-10-CM | POA: Diagnosis present

## 2021-05-02 DIAGNOSIS — D631 Anemia in chronic kidney disease: Secondary | ICD-10-CM | POA: Diagnosis present

## 2021-05-02 DIAGNOSIS — N19 Unspecified kidney failure: Secondary | ICD-10-CM

## 2021-05-02 DIAGNOSIS — J810 Acute pulmonary edema: Secondary | ICD-10-CM | POA: Diagnosis not present

## 2021-05-02 DIAGNOSIS — R531 Weakness: Secondary | ICD-10-CM | POA: Diagnosis not present

## 2021-05-02 LAB — COMPREHENSIVE METABOLIC PANEL
ALT: 16 U/L (ref 0–44)
AST: 12 U/L — ABNORMAL LOW (ref 15–41)
Albumin: 4 g/dL (ref 3.5–5.0)
Alkaline Phosphatase: 110 U/L (ref 38–126)
Anion gap: 10 (ref 5–15)
BUN: 126 mg/dL — ABNORMAL HIGH (ref 6–20)
CO2: 15 mmol/L — ABNORMAL LOW (ref 22–32)
Calcium: 8.3 mg/dL — ABNORMAL LOW (ref 8.9–10.3)
Chloride: 113 mmol/L — ABNORMAL HIGH (ref 98–111)
Creatinine, Ser: 18.49 mg/dL — ABNORMAL HIGH (ref 0.61–1.24)
GFR, Estimated: 3 mL/min — ABNORMAL LOW (ref >60)
Glucose, Bld: 95 mg/dL (ref 70–99)
Potassium: 5.4 mmol/L — ABNORMAL HIGH (ref 3.5–5.1)
Sodium: 138 mmol/L (ref 135–145)
Total Bilirubin: 1 mg/dL (ref 0.3–1.2)
Total Protein: 7.2 g/dL (ref 6.5–8.1)

## 2021-05-02 LAB — IRON AND TIBC
Iron: 93 ug/dL (ref 45–182)
Saturation Ratios: 46 % — ABNORMAL HIGH (ref 17.9–39.5)
TIBC: 200 ug/dL — ABNORMAL LOW (ref 250–450)
UIBC: 107 ug/dL

## 2021-05-02 LAB — MAGNESIUM: Magnesium: 2.9 mg/dL — ABNORMAL HIGH (ref 1.7–2.4)

## 2021-05-02 LAB — CBC WITH DIFFERENTIAL/PLATELET
Abs Immature Granulocytes: 0.02 10*3/uL (ref 0.00–0.07)
Basophils Absolute: 0 10*3/uL (ref 0.0–0.1)
Basophils Relative: 1 %
Eosinophils Absolute: 0.2 10*3/uL (ref 0.0–0.5)
Eosinophils Relative: 6 %
HCT: 24.4 % — ABNORMAL LOW (ref 39.0–52.0)
Hemoglobin: 7.4 g/dL — ABNORMAL LOW (ref 13.0–17.0)
Immature Granulocytes: 1 %
Lymphocytes Relative: 17 %
Lymphs Abs: 0.7 10*3/uL (ref 0.7–4.0)
MCH: 25.3 pg — ABNORMAL LOW (ref 26.0–34.0)
MCHC: 30.3 g/dL (ref 30.0–36.0)
MCV: 83.6 fL (ref 80.0–100.0)
Monocytes Absolute: 0.2 10*3/uL (ref 0.1–1.0)
Monocytes Relative: 5 %
Neutro Abs: 2.8 10*3/uL (ref 1.7–7.7)
Neutrophils Relative %: 70 %
Platelets: 117 10*3/uL — ABNORMAL LOW (ref 150–400)
RBC: 2.92 MIL/uL — ABNORMAL LOW (ref 4.22–5.81)
RDW: 17 % — ABNORMAL HIGH (ref 11.5–15.5)
WBC: 4 10*3/uL (ref 4.0–10.5)
nRBC: 0 % (ref 0.0–0.2)

## 2021-05-02 LAB — GLUCOSE, CAPILLARY
Glucose-Capillary: 62 mg/dL — ABNORMAL LOW (ref 70–99)
Glucose-Capillary: 68 mg/dL — ABNORMAL LOW (ref 70–99)
Glucose-Capillary: 105 mg/dL — ABNORMAL HIGH (ref 70–99)

## 2021-05-02 LAB — VITAMIN B12: Vitamin B-12: 342 pg/mL (ref 180–914)

## 2021-05-02 LAB — RESP PANEL BY RT-PCR (FLU A&B, COVID) ARPGX2
Influenza A by PCR: NEGATIVE
Influenza B by PCR: NEGATIVE
SARS Coronavirus 2 by RT PCR: POSITIVE — AB

## 2021-05-02 LAB — HEMOGLOBIN A1C
Hgb A1c MFr Bld: 5.9 % — ABNORMAL HIGH (ref 4.8–5.6)
Mean Plasma Glucose: 122.63 mg/dL

## 2021-05-02 LAB — TSH: TSH: 2.969 u[IU]/mL (ref 0.350–4.500)

## 2021-05-02 LAB — BRAIN NATRIURETIC PEPTIDE: B Natriuretic Peptide: 1359 pg/mL — ABNORMAL HIGH (ref 0.0–100.0)

## 2021-05-02 LAB — FERRITIN: Ferritin: 591 ng/mL — ABNORMAL HIGH (ref 24–336)

## 2021-05-02 MED ORDER — HEPARIN SODIUM (PORCINE) 5000 UNIT/ML IJ SOLN
5000.0000 [IU] | Freq: Three times a day (TID) | INTRAMUSCULAR | Status: DC
  Administered 2021-05-02 – 2021-05-04 (×7): 5000 [IU] via SUBCUTANEOUS
  Filled 2021-05-02 (×7): qty 1

## 2021-05-02 MED ORDER — ONDANSETRON HCL 4 MG PO TABS: 4.0000 mg | ORAL_TABLET | Freq: Four times a day (QID) | ORAL | Status: DC | PRN

## 2021-05-02 MED ORDER — HEPARIN SODIUM (PORCINE) 1000 UNIT/ML DIALYSIS: 20.0000 [IU]/kg | INTRAMUSCULAR | Status: DC | PRN

## 2021-05-02 MED ORDER — LIDOCAINE-PRILOCAINE 2.5-2.5 % EX CREA: 1.0000 "application " | TOPICAL_CREAM | CUTANEOUS | Status: DC | PRN

## 2021-05-02 MED ORDER — INSULIN GLARGINE-YFGN 100 UNIT/ML ~~LOC~~ SOLN
30.0000 [IU] | Freq: Every day | SUBCUTANEOUS | Status: DC
  Filled 2021-05-02 (×2): qty 0.3

## 2021-05-02 MED ORDER — INSULIN GLARGINE 100 UNIT/ML SOLOSTAR PEN: 30.0000 [IU] | PEN_INJECTOR | Freq: Every day | SUBCUTANEOUS | Status: DC

## 2021-05-02 MED ORDER — SODIUM ZIRCONIUM CYCLOSILICATE 5 G PO PACK
10.0000 g | PACK | Freq: Once | ORAL | Status: AC
  Administered 2021-05-02: 10 g via ORAL
  Filled 2021-05-02: qty 2

## 2021-05-02 MED ORDER — DARBEPOETIN ALFA 60 MCG/0.3ML IJ SOSY
PREFILLED_SYRINGE | INTRAMUSCULAR | Status: AC
  Administered 2021-05-02: 60 ug via INTRAVENOUS
  Filled 2021-05-02: qty 0.3

## 2021-05-02 MED ORDER — HYDRALAZINE HCL 20 MG/ML IJ SOLN
10.0000 mg | INTRAMUSCULAR | Status: DC | PRN
  Administered 2021-05-03: 10 mg via INTRAVENOUS
  Filled 2021-05-02: qty 1

## 2021-05-02 MED ORDER — MOLNUPIRAVIR EUA 200MG CAPSULE
4.0000 | ORAL_CAPSULE | Freq: Two times a day (BID) | ORAL | Status: DC
  Administered 2021-05-02: 800 mg via ORAL
  Filled 2021-05-02 (×6): qty 4

## 2021-05-02 MED ORDER — GUAIFENESIN 100 MG/5ML PO SOLN: 5.0000 mL | ORAL | Status: DC | PRN

## 2021-05-02 MED ORDER — DARBEPOETIN ALFA 60 MCG/0.3ML IJ SOSY
60.0000 ug | PREFILLED_SYRINGE | INTRAMUSCULAR | Status: DC
  Filled 2021-05-02: qty 0.3

## 2021-05-02 MED ORDER — ACETAMINOPHEN 650 MG RE SUPP: 650.0000 mg | Freq: Four times a day (QID) | RECTAL | Status: DC | PRN

## 2021-05-02 MED ORDER — IPRATROPIUM-ALBUTEROL 0.5-2.5 (3) MG/3ML IN SOLN
3.0000 mL | RESPIRATORY_TRACT | Status: DC | PRN
Start: 2021-05-02 — End: 2021-05-02

## 2021-05-02 MED ORDER — ACETAMINOPHEN 325 MG PO TABS: 650.0000 mg | ORAL_TABLET | Freq: Four times a day (QID) | ORAL | Status: DC | PRN

## 2021-05-02 MED ORDER — PENTAFLUOROPROP-TETRAFLUOROETH EX AERO
INHALATION_SPRAY | CUTANEOUS | Status: AC
  Administered 2021-05-02: 1 via TOPICAL
  Filled 2021-05-02: qty 116

## 2021-05-02 MED ORDER — SODIUM CHLORIDE 0.9 % IV SOLN: 100.0000 mL | INTRAVENOUS | Status: DC | PRN

## 2021-05-02 MED ORDER — CHLORHEXIDINE GLUCONATE CLOTH 2 % EX PADS
6.0000 | MEDICATED_PAD | Freq: Every day | CUTANEOUS | Status: DC
  Administered 2021-05-04: 6 via TOPICAL

## 2021-05-02 MED ORDER — ONDANSETRON HCL 4 MG/2ML IJ SOLN
4.0000 mg | Freq: Four times a day (QID) | INTRAMUSCULAR | Status: DC | PRN
  Administered 2021-05-03: 4 mg via INTRAVENOUS
  Filled 2021-05-02: qty 2

## 2021-05-02 MED ORDER — METOPROLOL TARTRATE 5 MG/5ML IV SOLN: 5.0000 mg | INTRAVENOUS | Status: DC | PRN

## 2021-05-02 MED ORDER — BISACODYL 5 MG PO TBEC
5.0000 mg | DELAYED_RELEASE_TABLET | Freq: Every day | ORAL | Status: DC | PRN
  Administered 2021-05-04: 5 mg via ORAL
  Filled 2021-05-02: qty 1

## 2021-05-02 MED ORDER — HEPARIN SODIUM (PORCINE) 1000 UNIT/ML IJ SOLN
INTRAMUSCULAR | Status: AC
  Administered 2021-05-02: 1800 [IU] via INTRAVENOUS_CENTRAL
  Filled 2021-05-02: qty 2

## 2021-05-02 MED ORDER — PENTAFLUOROPROP-TETRAFLUOROETH EX AERO: 1.0000 "application " | INHALATION_SPRAY | CUTANEOUS | Status: DC | PRN

## 2021-05-02 MED ORDER — LABETALOL HCL 200 MG PO TABS
200.0000 mg | ORAL_TABLET | Freq: Three times a day (TID) | ORAL | Status: DC
  Administered 2021-05-02 – 2021-05-04 (×5): 200 mg via ORAL
  Filled 2021-05-02 (×6): qty 1

## 2021-05-02 MED ORDER — CALCIUM ACETATE (PHOS BINDER) 667 MG PO CAPS
2001.0000 mg | ORAL_CAPSULE | Freq: Three times a day (TID) | ORAL | Status: DC
  Administered 2021-05-03 – 2021-05-04 (×5): 2001 mg via ORAL
  Filled 2021-05-02 (×5): qty 3

## 2021-05-02 MED ORDER — SODIUM CHLORIDE 0.9 % IV SOLN
100.0000 mg | Freq: Once | INTRAVENOUS | Status: DC
  Filled 2021-05-02: qty 5

## 2021-05-02 MED ORDER — OXYCODONE HCL 5 MG PO TABS: 5.0000 mg | ORAL_TABLET | ORAL | Status: DC | PRN

## 2021-05-02 MED ORDER — PANTOPRAZOLE SODIUM 40 MG PO TBEC
40.0000 mg | DELAYED_RELEASE_TABLET | Freq: Every day | ORAL | Status: DC
  Administered 2021-05-03 – 2021-05-04 (×2): 40 mg via ORAL
  Filled 2021-05-02 (×2): qty 1

## 2021-05-02 MED ORDER — LIDOCAINE HCL (PF) 1 % IJ SOLN: 5.0000 mL | INTRAMUSCULAR | Status: DC | PRN

## 2021-05-02 MED ORDER — SENNOSIDES-DOCUSATE SODIUM 8.6-50 MG PO TABS: 1.0000 | ORAL_TABLET | Freq: Every evening | ORAL | Status: DC | PRN

## 2021-05-02 MED ORDER — INSULIN ASPART 100 UNIT/ML IJ SOLN
0.0000 [IU] | Freq: Every day | INTRAMUSCULAR | Status: DC
  Administered 2021-05-03: 3 [IU] via SUBCUTANEOUS

## 2021-05-02 MED ORDER — INSULIN ASPART 100 UNIT/ML IJ SOLN
0.0000 [IU] | Freq: Three times a day (TID) | INTRAMUSCULAR | Status: DC
Start: 2021-05-02 — End: 2021-05-04
  Administered 2021-05-03: 2 [IU] via SUBCUTANEOUS
  Administered 2021-05-04 (×2): 1 [IU] via SUBCUTANEOUS

## 2021-05-02 MED ORDER — AMLODIPINE BESYLATE 5 MG PO TABS
10.0000 mg | ORAL_TABLET | Freq: Every day | ORAL | Status: DC
  Administered 2021-05-02 – 2021-05-04 (×2): 10 mg via ORAL
  Filled 2021-05-02 (×3): qty 2

## 2021-05-02 NOTE — ED Provider Notes (Signed)
  Face-to-face evaluation   History: He presents for evaluation of shortness of breath, worse when supine but present persistently since recent diagnosis of COVID and being unable to get to dialysis.  He has not had dialysis in over a week.  He denies fever, chills, chest pain, focal weakness or paresthesia  Physical exam: Alert male, who is calm and cooperative.  He is not in respiratory distress.  Chest is nontender.  Abdomen is soft and nontender.  He is able to speak in full sentences.  Medical screening examination/treatment/procedure(s) were conducted as a shared visit with non-physician practitioner(s) and myself.  I personally evaluated the patient during the encounter    Daleen Bo, MD 05/02/21 2011

## 2021-05-02 NOTE — ED Provider Notes (Signed)
[facility] Provider Note   CSN: [id number] Arrival date & time: 05/02/21  1103     History Chief Complaint  Patient presents with   Shortness of Breath    Alejandro Lee is a 60 y.o. male.  HPI  Patient with significant medical history of end-stage renal disease Monday Fridays, diabetes type 2, CHF, presents with chief complaint of shortness of breath and missed dialysis treatments.  Patient states he was diagnosed with COVID on the 14th please see media for picture of positive COVID test and since then he has been unable to get his dialysis treatment because the taxi service will not drive him there.  He states that the last time he had dialysis was on the 11th, he states since then he is having worsening shortness of breath, increased orthopnea, but denies worsening pedal edema.  He has no associated chest pain, no stomach pain, nausea, vomiting, diarrhea.  He has no other complaints at this time.  States that he was placed on the antiviral treatment, states he has a few more days left, he denies cough, nasal congestion, sore throat, worsening headaches, general body aches.  Patient signed feel that difficulty. Past Medical History:  Diagnosis Date   Anemia    1 blood transfusion   Arthritis    Back pain    CHF (congestive heart failure) (HCC)    Chronic kidney disease    dialysis M/W/F   Diabetes mellitus without complication (HCC)    DVT (deep venous thrombosis) (HCC)    GERD (gastroesophageal reflux disease)    Headache    migraines   History of cardiovascular stress test 11/2017   [facility]) negative dobutamine stress test   Hyperlipidemia    Hypertension    Legally blind    right   Myocardial infarction [facility])    patient states it was seen on EKG, denies a cardiac cath    Patient Active Problem List   Diagnosis Date Noted   Acute respiratory distress 05/02/2021   COVID-19 virus infection 05/02/2021   Fluid overload 05/02/2021   Pneumonia due to  COVID-19 virus 09/05/2020   ESRD on hemodialysis ([place])    Hyperkalemia 06/25/2018   Non-compliance with renal dialysis ([place]) 06/25/2018   Generalized abdominal pain 06/25/2018   Chronic back pain 06/25/2018   GERD (gastroesophageal reflux disease) 06/25/2018   Chronic diastolic HF (heart failure) ([place]) 06/25/2018   Class 1 obesity due to excess calories in adult 06/25/2018   Benign essential HTN 06/25/2018   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/2017   Diabetes mellitus with end-stage renal disease ([place]) 12/19/2017   ESRD (end stage renal disease) on dialysis ([place]) 12/19/2017   Chronic right-sided low back pain without sciatica 12/19/2017   Pseudophakia of left eye 09/03/2017   PDR (proliferative diabetic retinopathy) ([place]) 09/03/2017   Cataract in degenerative disorder 09/03/2017   Unspecified complication of internal prosthetic device, implant and graft, initial encounter 05/17/2017   Rectal abscess 05/17/2017   Osteopathy in diseases classified elsewhere, unspecified site [id number]   Metabolic disorder [id number]   Hypertension 05/16/2017   Dependence on renal dialysis ([place]) 05/16/2017   Anemia of chronic renal failure, stage 5 ([place]) 05/16/2017   Retinal detachment, tractional, both eyes 06/19/2012    Past Surgical History:  Procedure Laterality Date   A/V FISTULAGRAM Right 07/22/2018   Procedure: A/V FISTULAGRAM;  Surgeon: Serafina Mitchell, MD;  Location: [place] [facility];  Service: Cardiovascular;  Laterality: Right;   AV FISTULA  PLACEMENT Right    AV FISTULA PLACEMENT Right [id number]   Procedure: PLICATION OF RIGHT arm FISTULA;  Surgeon: Waynetta Sandy, MD;  Location: [facility];  Service: Vascular;  Laterality: Right;   [place] Left 02/05/2019   Procedure: BRACHIOCEPHALIC FISTULA  LEFT ARM;  Surgeon: Serafina Mitchell, MD;  Location: [facility];  Service: Vascular;  Laterality: Left;   COLONOSCOPY     diabetic cyst removal Bilateral     on buttocks x8   EYE SURGERY     FISTULA SUPERFICIALIZATION Right [id number]   Procedure: PLICATION OF ARTERIOVENOUS FISTULA RIGHT ARM;  Surgeon: Conrad Rexford, MD;  Location: [facility];  Service: Vascular;  Laterality: Right;   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   lens replacement Left    LIGATION OF ARTERIOVENOUS  FISTULA  03/19/2019   Procedure: LIGATION OF ARTERIOVENOUS  OF COMPETING BRANCHES OF FISTULA LEFT UPPER ARM;  Surgeon: Serafina Mitchell, MD;  Location: [facility];  Service: Vascular;;   LIGATION OF ARTERIOVENOUS  FISTULA Right 05/07/2019   Procedure: LIGATION OF ARTERIOVENOUS  FISTULA RIGHT ARM;  Surgeon: Serafina Mitchell, MD;  Location: [facility];  Service: Vascular;  Laterality: Right;   NEPHRECTOMY TRANSPLANTED ORGAN     PERIPHERAL VASCULAR BALLOON ANGIOPLASTY  07/22/2018   Procedure: PERIPHERAL VASCULAR BALLOON ANGIOPLASTY;  Surgeon: Serafina Mitchell, MD;  Location: [place] [facility];  Service: Cardiovascular;;  Right [place] fistula    PERIPHERAL VASCULAR BALLOON ANGIOPLASTY Right 02/03/2019   Procedure: PERIPHERAL VASCULAR BALLOON ANGIOPLASTY;  Surgeon: Serafina Mitchell, MD;  Location: [place];  Service: Cardiovascular;  Laterality: Right;  RUE AVF   REFRACTIVE SURGERY Bilateral        Family History  Problem Relation Age of Onset   Diabetes Mother    Hypertension Mother    Hyperlipidemia Mother    Diabetes Father    Heart disease Father    Cancer Father    Hypertension Father    Hyperlipidemia Father    Stroke Father    Diabetes Sister    Hypertension Sister    Diabetes Brother    Hypertension Brother    Cancer Brother     Social History   Tobacco Use   Smoking status: Former    Types: Cigarettes   Smokeless tobacco: Never  Vaping Use   Vaping Use: Never used  Substance Use Topics   Alcohol use: Not Currently   Drug use: Never    Home Medications Prior to Admission medications   Medication Sig Start Date End Date Taking? Authorizing Provider   acetaminophen (TYLENOL) 500 MG tablet Take 1,000 mg by mouth every 6 (six) hours as needed for moderate pain.   Yes [provider]  albuterol (VENTOLIN HFA) 108 (90 Base) MCG/ACT inhaler Inhale 2 puffs into the lungs every 6 (six) hours as needed for wheezing or shortness of breath.   Yes [provider]  amLODipine (NORVASC) 10 MG tablet Take 1 tablet (10 mg total) by mouth daily. 09/07/20  Yes Emokpae, Courage, MD  ascorbic acid (VITAMIN C) 500 MG tablet Take 1 tablet (500 mg total) by mouth daily. 09/08/20  Yes Roxan Hockey, MD  azaTHIOprine (IMURAN) 50 MG tablet Take 1 tablet (50 mg total) by mouth daily. 09/07/20  Yes Emokpae, Courage, MD  calcium acetate (PHOSLO) 667 MG capsule Take 3 capsules by mouth 3 (three) times daily. 03/29/21  Yes [provider]  guaiFENesin-dextromethorphan (ROBITUSSIN DM) 100-10 MG/5ML syrup Take  10 mLs by mouth every 4 (four) hours as needed for cough. 09/07/20  Yes Emokpae, Courage, MD  labetalol (NORMODYNE) 200 MG tablet Take 1 tablet (200 mg total) by mouth 3 (three) times daily. 09/07/20  Yes Emokpae, Courage, MD  LAGEVRIO 200 MG CAPS capsule Take 4 capsules by mouth 2 (two) times daily. 04/27/21  Yes [provider]  LANTUS SOLOSTAR 100 UNIT/ML Solostar Pen Inject 40 Units into the skin at bedtime. Patient taking differently: Inject 30 Units into the skin at bedtime. 08/09/20  Yes Nida, Marella Chimes, MD  NOVOLOG FLEXPEN 100 UNIT/ML FlexPen Inject 8-14 Units into the skin 3 (three) times daily before meals. Patient taking differently: Inject 8-10 Units into the skin 3 (three) times daily before meals. 08/09/20  Yes Cassandria Anger, MD  pantoprazole (PROTONIX) 40 MG tablet Take 40 mg by mouth daily. 07/01/19  Yes [provider]  Tacrolimus ER 4 MG TB24 Take 16 mg by mouth daily in the afternoon. 09/07/20  Yes Emokpae, Courage, MD  zinc sulfate 220 (50 Zn) MG capsule Take 1 capsule (220 mg total) by mouth daily.  09/08/20  Yes Roxan Hockey, MD    Allergies    Patient has no known allergies.  Review of Systems   Review of Systems  Constitutional:  Negative for chills and fever.  HENT:  Negative for congestion.   Respiratory:  Positive for shortness of breath.   Cardiovascular:  Negative for chest pain and palpitations.  Gastrointestinal:  Negative for abdominal pain, diarrhea, nausea and vomiting.  Genitourinary:  Negative for enuresis.  Musculoskeletal:  Negative for back pain.  Skin:  Negative for rash.  Neurological:  Negative for dizziness.  Hematological:  Does not bruise/bleed easily.   Physical Exam Updated Vital Signs BP (!) 146/103   Pulse (!) 57   Resp 17   Ht '5\' 7"'$  (1.702 m)   Wt 89.4 kg   SpO2 98%   BMI 30.85 kg/m   Physical Exam Vitals and nursing note reviewed.  Constitutional:      General: He is not in acute distress.    Appearance: He is not ill-appearing.  HENT:     Head: Normocephalic and atraumatic.     Nose: No congestion.  Eyes:     Conjunctiva/sclera: Conjunctivae normal.  Cardiovascular:     Rate and Rhythm: Normal rate and regular rhythm.     Pulses: Normal pulses.     Heart sounds: No murmur heard.   No friction rub. No gallop.  Pulmonary:     Effort: No respiratory distress.     Breath sounds: Rales present. No wheezing or rhonchi.     Comments: Patient is tachypneic, satting at 94% room air, does not appear to be in acute respiratory distress, patient has notable rales in the bilateral lobes worse than left versus the right, no wheezing or rhonchi present. Abdominal:     Palpations: Abdomen is soft.     Tenderness: There is no abdominal tenderness. There is no right CVA tenderness or left CVA tenderness.  Musculoskeletal:     Right lower leg: No edema.     Left lower leg: No edema.  Skin:    General: Skin is warm and dry.     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  Neurological:  Mental Status: He is alert.  Psychiatric:        Mood and Affect: Mood normal.    ED Results / Procedures / Treatments   Labs (all labs ordered are listed, but only abnormal results are displayed) Labs Reviewed  COMPREHENSIVE METABOLIC PANEL - Abnormal; Notable for the following components:      Result Value   Potassium 5.4 (*)    Chloride 113 (*)    CO2 15 (*)    BUN 126 (*)    Creatinine, Ser 18.49 (*)    Calcium 8.3 (*)    AST 12 (*)    GFR, Estimated 3 (*)    All other components within normal limits  BRAIN NATRIURETIC PEPTIDE - Abnormal; Notable for the following components:   B Natriuretic Peptide 1,359.0 (*)    All other components within normal limits  CBC WITH DIFFERENTIAL/PLATELET - Abnormal; Notable for the following components:   RBC 2.92 (*)    Hemoglobin 7.4 (*)    HCT 24.4 (*)    MCH 25.3 (*)    RDW 17.0 (*)    Platelets 117 (*)    All other components within normal limits  MAGNESIUM - Abnormal; Notable for the following components:   Magnesium 2.9 (*)    All other components within normal limits  RESP PANEL BY RT-PCR (FLU A&B, COVID) [id number]  HEMOGLOBIN A1C  IRON AND TIBC  FOLATE RBC  FERRITIN  TSH  VITAMIN B12    EKG EKG Interpretation  Date/Time:  Tuesday May 02 2021 13:09:42 EDT Ventricular Rate:  54 PR Interval:  200 QRS Duration: 99 QT Interval:  496 QTC Calculation: 471 R Axis:   -30 Text Interpretation: Sinus rhythm Left axis deviation Borderline low voltage, extremity leads since last tracing no significant change Confirmed by Daleen Bo [phone]) on 05/02/2021 1:20:21 PM  Radiology DG Chest Port 1 View  Result Date: 05/02/2021 CLINICAL DATA:  Edema, positive COVID test EXAM: PORTABLE CHEST 1 VIEW COMPARISON:  Chest radiograph 09/05/2020 FINDINGS: The heart is enlarged, unchanged. The mediastinal contours are stable. The  pulmonary arteries are prominent. There are increased interstitial markings throughout both lungs consistent with vascular congestion. There may be mild pulmonary interstitial edema. There is blunting of the costophrenic angles which is unchanged going back to studies from 2020, favored to reflect scarring. There is no right effusion. There is no pneumothorax There is no acute osseous abnormality. IMPRESSION: Cardiomegaly with vascular congestion and possible mild pulmonary interstitial edema. Electronically Signed   By: Valetta Mole M.D.   On: 05/02/2021 12:58    Procedures Procedures   Medications Ordered in ED Medications  Chlorhexidine Gluconate Cloth 2 % PADS 6 each (has no administration in time range)  insulin aspart (novoLOG) injection 0-9 Units (has no administration in time range)  insulin aspart (novoLOG) injection 0-5 Units (has no administration in time range)  heparin injection 5,000 Units (has no administration in time range)  acetaminophen (TYLENOL) tablet 650 mg (has no administration in time range)    Or  acetaminophen (TYLENOL) suppository 650 mg (has no administration in time range)  oxyCODONE (Oxy IR/ROXICODONE) immediate release tablet 5 mg (has no administration in time range)  senna-docusate (Senokot-S) tablet 1 tablet (has no administration in time range)  bisacodyl (DULCOLAX) EC tablet 5 mg (has no administration in time range)  ondansetron (ZOFRAN) tablet 4 mg (has no administration in time range)    Or  ondansetron (ZOFRAN) injection 4 mg (has no administration in time range)  ipratropium-albuterol (DUONEB) 0.5-2.5 (3) MG/3ML nebulizer solution 3 mL (has no administration in time range)  hydrALAZINE (APRESOLINE) injection 10 mg (has no administration in time range)  metoprolol tartrate (LOPRESSOR) injection 5 mg (has no administration in time range)  guaiFENesin (ROBITUSSIN) 100 MG/5ML solution 100 mg (has no administration in time range)  sodium zirconium  cyclosilicate (LOKELMA) packet 10 g (10 g Oral Given 05/02/21 1353)    ED Course  I have reviewed the triage vital signs and the nursing notes.  Pertinent labs & imaging results that were available during my care of the patient were reviewed by me and considered in my medical decision making (see chart for details).    MDM Rules/Calculators/A&P                          Initial impression-  Work-up-CBC shows normocytic anemia with hemoglobin of 7.4, CMP shows hypokalemia 5.4, elevated chloride of 113, CO2 15, BUN 128, creatinine 18, calcium 8.3, BUN 1359, magnesium 2.9 chest x-ray reveals cardiomegaly with vascular congestion possible mild pulmonary interstitial edema, EKG sinus without signs of ischemia  Reassessment-patient is severely uremic with a BUN of 129, elevated creatinine with signs of volume overloaded suspect patient will need emergent dialysis.  Will speak with nephrology for further recommendations.    Patient has slightly elevated potassium 5.4 no EKG changes, will provide him with [facility]  Patient was updated on recommendation from nephrology patient agreement this plan will consult hospitalist for admission.  Consult- spoke with Dr. Arty Baumgartner who recommends dialysis at this time, and admit to the hospitalist team as patient will need multiple dialysis treatment.  Spoke with Dr. Thelma Comp who will admit the patient   Rule out-low suspicion for systemic infection as patient is nontoxic-appearing, vital signs reassuring, no leukocytosis noted.  I have low suspicion for GI bleed as patient denies hematochezia, melena, hematochezia, coffee-ground emesis.  Noted that patient has normocytic anemia hemoglobin 7.4, Patient states last hemoglobin was 8 from his dialysis treatment, I suspect blood loss is from chronic diseases.  I recommended Hemoccult the patient deferred on this.  I have low suspicion for ACS as patient has chest pain, shortness of breath, EKG sinus without signs of  ischemia.  Low suspicion for CHF exacerbation as he has no pleural effusion present on CT chest, he has no new oxygen requirements, does have an elevated BNP likely this is secondary due to volume overload from missed dialysis treatment.  I have low suspicion for viral URI and/or pneumonia as patient denies productive cough, chest x-ray is negative for acute findings, no elevation in white count.  Likely shortness of breath secondary due to volume overloaded.  Plan-admit to medicine for emergent hemodialysis.    Final Clinical Impression(s) / ED Diagnoses Final diagnoses:  Shortness of breath  Uremia due to inadequate renal perfusion  Hypermagnesemia  Hyperkalemia    Rx / DC Orders ED Discharge Orders     None        Marcello Fennel, PA-C 05/02/21 1428    Daleen Bo, MD 05/02/21 2012

## 2021-05-02 NOTE — Consult Note (Signed)
[facility] Renal Consultation Note    Indication for Consultation:  Management of ESRD/hemodialysis; anemia, hypertension/volume and secondary hyperparathyroidism  HPI: Alejandro Lee is a 60 y.o. 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  He normally goes to HD on Mondays and Fridays only at [facility], however he tested positive for covid-19 on 04/26/21 and was to start HD at [facility] MWF on 9/16, however he did not show up.  His last HD was 04/24/21 at [facility].  Per the patient, his transportation would not take him because he was covid +.  He did not tell his wife to drive him to HD scheduled on 9/19 and presents to [facility] ED complaining of worsening SOB, orthopnea, and DOE.  In the ED, he was found to have BP 148/114, pulse 54, RR 26, SpO2 94%, labs were notable for K 5.4, Co2 15, BUN 126, Cr 18.49, Hgb 7.4, and Mg 2.9.  ECG without changes of hyperkalemia.  CXR with cardiomegaly and vascular congestion.  He will be admitted by hospitalist service until transportation can be arranged for outpatient HD.  We were consulted to provide HD during his hospitalization.  Past Medical History:  Diagnosis Date   Anemia    1 blood transfusion   Arthritis    Back pain    CHF (congestive heart failure) (HCC)    Chronic kidney disease    dialysis M/W/F   Diabetes mellitus without complication (HCC)    DVT (deep venous thrombosis) (HCC)    GERD (gastroesophageal reflux disease)    Headache    migraines   History of cardiovascular stress test 11/2017   [facility]) negative dobutamine stress test   Hyperlipidemia    Hypertension    Legally blind    right   Myocardial infarction [facility])    patient states it was seen on EKG, denies a cardiac cath   Past Surgical History:  Procedure Laterality Date   A/V FISTULAGRAM Right  07/22/2018   Procedure: A/V FISTULAGRAM;  Surgeon: Serafina Mitchell, MD;  Location: [place] [facility];  Service: Cardiovascular;  Laterality: Right;   AV FISTULA PLACEMENT Right    AV FISTULA PLACEMENT Right [id number]   Procedure: PLICATION OF RIGHT arm FISTULA;  Surgeon: Waynetta Sandy, MD;  Location: [place];  Service: Vascular;  Laterality: Right;   [place] Left 02/05/2019   Procedure: BRACHIOCEPHALIC FISTULA  LEFT ARM;  Surgeon: Serafina Mitchell, MD;  Location: [facility] OR;  Service: Vascular;  Laterality: Left;   COLONOSCOPY     diabetic cyst removal Bilateral    on buttocks x8   EYE SURGERY     FISTULA SUPERFICIALIZATION Right [id number]   Procedure: PLICATION OF ARTERIOVENOUS FISTULA RIGHT ARM;  Surgeon: Conrad Hazen, MD;  Location: [place];  Service: Vascular;  Laterality: Right;   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   lens replacement Left    LIGATION OF ARTERIOVENOUS  FISTULA  03/19/2019   Procedure: LIGATION OF ARTERIOVENOUS  OF COMPETING BRANCHES OF FISTULA LEFT UPPER ARM;  Surgeon: Serafina Mitchell, MD;  Location: [facility] OR;  Service: Vascular;;   LIGATION OF ARTERIOVENOUS  FISTULA Right 05/07/2019   Procedure: LIGATION OF ARTERIOVENOUS  FISTULA RIGHT ARM;  Surgeon: Serafina Mitchell, MD;  Location: [place];  Service:  Vascular;  Laterality: Right;   NEPHRECTOMY TRANSPLANTED ORGAN     PERIPHERAL VASCULAR BALLOON ANGIOPLASTY  07/22/2018   Procedure: PERIPHERAL VASCULAR BALLOON ANGIOPLASTY;  Surgeon: Serafina Mitchell, MD;  Location: [place] [facility];  Service: Cardiovascular;;  Right [place] fistula    PERIPHERAL VASCULAR BALLOON ANGIOPLASTY Right 02/03/2019   Procedure: PERIPHERAL VASCULAR BALLOON ANGIOPLASTY;  Surgeon: Serafina Mitchell, MD;  Location: [place] [facility];  Service: Cardiovascular;  Laterality: Right;  RUE AVF   REFRACTIVE SURGERY Bilateral    Family History:   Family History  Problem Relation Age of Onset   Diabetes Mother    Hypertension Mother     Hyperlipidemia Mother    Diabetes Father    Heart disease Father    Cancer Father    Hypertension Father    Hyperlipidemia Father    Stroke Father    Diabetes Sister    Hypertension Sister    Diabetes Brother    Hypertension Brother    Cancer Brother    Social History:  reports that he has quit smoking. His smoking use included cigarettes. He has never used smokeless tobacco. He reports that he does not currently use alcohol. He reports that he does not use drugs. No Known Allergies Prior to Admission medications   Medication Sig Start Date End Date Taking? Authorizing Provider  acetaminophen (TYLENOL) 500 MG tablet Take 1,000 mg by mouth every 6 (six) hours as needed for moderate pain.   Yes [provider]  albuterol (VENTOLIN HFA) 108 (90 Base) MCG/ACT inhaler Inhale 2 puffs into the lungs every 6 (six) hours as needed for wheezing or shortness of breath.   Yes [provider]  amLODipine (NORVASC) 10 MG tablet Take 1 tablet (10 mg total) by mouth daily. 09/07/20  Yes Emokpae, Courage, MD  ascorbic acid (VITAMIN C) 500 MG tablet Take 1 tablet (500 mg total) by mouth daily. 09/08/20  Yes Roxan Hockey, MD  azaTHIOprine (IMURAN) 50 MG tablet Take 1 tablet (50 mg total) by mouth daily. 09/07/20  Yes Emokpae, Courage, MD  calcium acetate (PHOSLO) 667 MG capsule Take 3 capsules by mouth 3 (three) times daily. 03/29/21  Yes [provider]  guaiFENesin-dextromethorphan (ROBITUSSIN DM) 100-10 MG/5ML syrup Take 10 mLs by mouth every 4 (four) hours as needed for cough. 09/07/20  Yes Emokpae, Courage, MD  labetalol (NORMODYNE) 200 MG tablet Take 1 tablet (200 mg total) by mouth 3 (three) times daily. 09/07/20  Yes Emokpae, Courage, MD  LAGEVRIO 200 MG CAPS capsule Take 4 capsules by mouth 2 (two) times daily. 04/27/21  Yes [provider]  LANTUS SOLOSTAR 100 UNIT/ML Solostar Pen Inject 40 Units into the skin at bedtime. Patient taking differently: Inject 30  Units into the skin at bedtime. 08/09/20  Yes Nida, Marella Chimes, MD  NOVOLOG FLEXPEN 100 UNIT/ML FlexPen Inject 8-14 Units into the skin 3 (three) times daily before meals. Patient taking differently: Inject 8-10 Units into the skin 3 (three) times daily before meals. 08/09/20  Yes Cassandria Anger, MD  pantoprazole (PROTONIX) 40 MG tablet Take 40 mg by mouth daily. 07/01/19  Yes [provider]  Tacrolimus ER 4 MG TB24 Take 16 mg by mouth daily in the afternoon. 09/07/20  Yes Emokpae, Courage, MD  zinc sulfate 220 (50 Zn) MG capsule Take 1 capsule (220 mg total) by mouth daily. 09/08/20  Yes Roxan Hockey, MD   Current Facility-Administered Medications  Medication Dose Route Frequency Provider Last Rate Last Admin   [  START ON 05/03/2021] Chlorhexidine Gluconate Cloth 2 % PADS 6 each  6 each Topical Q0600 Donato Heinz, MD       Current Outpatient Medications  Medication Sig Dispense Refill   acetaminophen (TYLENOL) 500 MG tablet Take 1,000 mg by mouth every 6 (six) hours as needed for moderate pain.     albuterol (VENTOLIN HFA) 108 (90 Base) MCG/ACT inhaler Inhale 2 puffs into the lungs every 6 (six) hours as needed for wheezing or shortness of breath.     amLODipine (NORVASC) 10 MG tablet Take 1 tablet (10 mg total) by mouth daily. 30 tablet 4   ascorbic acid (VITAMIN C) 500 MG tablet Take 1 tablet (500 mg total) by mouth daily. 30 tablet 3   azaTHIOprine (IMURAN) 50 MG tablet Take 1 tablet (50 mg total) by mouth daily. 90 tablet 3   calcium acetate (PHOSLO) 667 MG capsule Take 3 capsules by mouth 3 (three) times daily.     guaiFENesin-dextromethorphan (ROBITUSSIN DM) 100-10 MG/5ML syrup Take 10 mLs by mouth every 4 (four) hours as needed for cough. 118 mL 0   labetalol (NORMODYNE) 200 MG tablet Take 1 tablet (200 mg total) by mouth 3 (three) times daily. 90 tablet 5   LAGEVRIO 200 MG CAPS capsule Take 4 capsules by mouth 2 (two) times daily.     LANTUS SOLOSTAR 100  UNIT/ML Solostar Pen Inject 40 Units into the skin at bedtime. (Patient taking differently: Inject 30 Units into the skin at bedtime.) 15 mL 1   NOVOLOG FLEXPEN 100 UNIT/ML FlexPen Inject 8-14 Units into the skin 3 (three) times daily before meals. (Patient taking differently: Inject 8-10 Units into the skin 3 (three) times daily before meals.) 15 mL 1   pantoprazole (PROTONIX) 40 MG tablet Take 40 mg by mouth daily.     Tacrolimus ER 4 MG TB24 Take 16 mg by mouth daily in the afternoon. 90 tablet 3   zinc sulfate 220 (50 Zn) MG capsule Take 1 capsule (220 mg total) by mouth daily. 30 capsule 4   Labs: Basic Metabolic Panel: Recent Labs  Lab 05/02/21 1144  NA 138  K 5.4*  CL 113*  CO2 15*  GLUCOSE 95  BUN 126*  CREATININE 18.49*  CALCIUM 8.3*   Liver Function Tests: Recent Labs  Lab 05/02/21 1144  AST 12*  ALT 16  ALKPHOS 110  BILITOT 1.0  PROT 7.2  ALBUMIN 4.0   No results for input(s): LIPASE, AMYLASE in the last 168 hours. No results for input(s): AMMONIA in the last 168 hours. CBC: Recent Labs  Lab 05/02/21 1144  WBC 4.0  NEUTROABS 2.8  HGB 7.4*  HCT 24.4*  MCV 83.6  PLT 117*   Cardiac Enzymes: No results for input(s): CKTOTAL, CKMB, CKMBINDEX, TROPONINI in the last 168 hours. CBG: No results for input(s): GLUCAP in the last 168 hours. Iron Studies: No results for input(s): IRON, TIBC, TRANSFERRIN, FERRITIN in the last 72 hours. Studies/Results: DG Chest Port 1 View  Result Date: 05/02/2021 CLINICAL DATA:  Edema, positive COVID test EXAM: PORTABLE CHEST 1 VIEW COMPARISON:  Chest radiograph 09/05/2020 FINDINGS: The heart is enlarged, unchanged. The mediastinal contours are stable. The pulmonary arteries are prominent. There are increased interstitial markings throughout both lungs consistent with vascular congestion. There may be mild pulmonary interstitial edema. There is blunting of the costophrenic angles which is unchanged going back to studies from 2020,  favored to reflect scarring. There is no right effusion. There is no pneumothorax There  is no acute osseous abnormality. IMPRESSION: Cardiomegaly with vascular congestion and possible mild pulmonary interstitial edema. Electronically Signed   By: Valetta Mole M.D.   On: 05/02/2021 12:58    ROS: Pertinent items are noted in HPI. Physical Exam: Vitals:   05/02/21 1121 05/02/21 1130 05/02/21 1309 05/02/21 1330  BP:  (!) 181/73 (!) 148/114 (!) 146/103  Pulse:  61 (!) 54 (!) 57  Resp:  (!) 29 (!) 26 17  TempSrc:      SpO2:  95% 94% 98%  Weight: 89.4 kg     Height: '5\' 7"'$  (1.702 m)         Weight change:  No intake or output data in the 24 hours ending 05/02/21 1406 BP (!) 146/103   Pulse (!) 57   Resp 17   Ht '5\' 7"'$  (1.702 m)   Wt 89.4 kg   SpO2 98%   BMI 30.85 kg/m   Physical exam: unable to complete due to COVID + status.  In order to preserve PPE equipment and to minimize exposure to providers.  Notes from other caregivers reviewed   Dialysis Access: LUE AVF  Dialysis Orders: [facility]  on Monday and Friday but since covid + [facility] . EDW 89kg HD Bath 2K/2.5Ca  Time 240 min Heparin 2000 units IV bolus, then 1000 units/hr. Access LUE AVF BFR 350 DFR 500    Calcitriol 2 mcg po/HD Epogen 8400  Units IV/HD  Venofer  50 mg IV/HD    Assessment/Plan:  Pulmonary edema - due to missed HD and volume overload.  Plan for HD today and again tomorrow, UF as tolerated. Hyperkalemia - due to missed HD, as above  ESRD -  will need to treat him as a new start given significant azotemia, to avoid dialysis dysequilibrium.  Plan short slow HD today and again tomorrow.  Hypertension/volume  - as above, UF with HD  Anemia  - missed ESA doses for past week and an half.  Will dose Aranesp today with HD.  Metabolic bone disease -  continue with home meds  Nutrition - renal, carb modified diet Disposition - will need SW assistance arranging for outpatient HD to [facility].   I spoke with the covid unit and they can take him on Friday if he can get transportation, however his normal service wont take him due to covid +.  Donetta Potts, MD [place] Pager [phone] 05/02/2021, 2:06 PM

## 2021-05-02 NOTE — ED Notes (Signed)
Per dialysis nurse pt will be transported to [id number] once dialysis is finished.

## 2021-05-02 NOTE — H&P (Signed)
History and Physical    Alejandro Lee [id number] DOB: Jun 17, 1961 DOA: 05/02/2021  PCP: Monico Blitz, MD Patient coming from: Home  Chief Complaint: Shortness of breath  HPI: Alejandro Lee is a 60 y.o. male with medical history significant of DM2, HTN, ESRD HD 1 Monday Friday status post renal transplant at [facility] in November 2020, failed in July 2021.  He comes to the hospital with complains of shortness of breath, orthopnea.  Patient had some URI type symptoms about 10 days ago and got himself tested for COVID-19 on 04/26/2021.  He was tested positive and was prescribed Molnupiravir for 5 days.  Denies any other complaints including fevers chills at this time.  He typically takes cab For transportation to his HD but was refused therefore missed HD sessions.  The ED he was noted to have BUN 126, creatinine 2.4, hemoglobin 7.4.  Chest x-ray suggestive of vascular congestion and cardiomegaly.  He is being admitted to the hospital for hemodialysis.   Review of Systems: As per HPI otherwise 10 point review of systems negative.  Review of Systems Otherwise negative except as per HPI, including: General: Denies fever, chills, night sweats or unintended weight loss. Resp: Denies hemoptysis Cardiac: Denies chest pain, palpitations, orthopnea, paroxysmal nocturnal dyspnea. GI: Denies abdominal pain, nausea, vomiting, diarrhea or constipation GU: Denies dysuria, frequency, hesitancy or incontinence MS: Denies muscle aches, joint pain or swelling Neuro: Denies headache, neurologic deficits (focal weakness, numbness, tingling), abnormal gait Psych: Denies anxiety, depression, SI/HI/AVH Skin: Denies new rashes or lesions ID: Denies sick contacts, exotic exposures, travel  Past Medical History:  Diagnosis Date   Anemia    1 blood transfusion   Arthritis    Back pain    CHF (congestive heart failure) (HCC)    Chronic kidney disease    dialysis M/W/F   Diabetes mellitus without  complication (HCC)    DVT (deep venous thrombosis) (HCC)    GERD (gastroesophageal reflux disease)    Headache    migraines   History of cardiovascular stress test 11/2017   [facility]) negative dobutamine stress test   Hyperlipidemia    Hypertension    Legally blind    right   Myocardial infarction [facility])    patient states it was seen on EKG, denies a cardiac cath    Past Surgical History:  Procedure Laterality Date   A/V FISTULAGRAM Right 07/22/2018   Procedure: A/V FISTULAGRAM;  Surgeon: Serafina Mitchell, MD;  Location: [place] CV LAB;  Service: Cardiovascular;  Laterality: Right;   AV FISTULA PLACEMENT Right    AV FISTULA PLACEMENT Right [id number]   Procedure: PLICATION OF RIGHT arm FISTULA;  Surgeon: Waynetta Sandy, MD;  Location: [place];  Service: Vascular;  Laterality: Right;   [place] Left 02/05/2019   Procedure: BRACHIOCEPHALIC FISTULA  LEFT ARM;  Surgeon: Serafina Mitchell, MD;  Location: [facility] OR;  Service: Vascular;  Laterality: Left;   COLONOSCOPY     diabetic cyst removal Bilateral    on buttocks x8   EYE SURGERY     FISTULA SUPERFICIALIZATION Right [id number]   Procedure: PLICATION OF ARTERIOVENOUS FISTULA RIGHT ARM;  Surgeon: Conrad Petrolia, MD;  Location: [place];  Service: Vascular;  Laterality: Right;   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   lens replacement Left    LIGATION OF ARTERIOVENOUS  FISTULA  03/19/2019   Procedure: LIGATION OF ARTERIOVENOUS  OF COMPETING BRANCHES OF FISTULA LEFT UPPER ARM;  Surgeon:  Serafina Mitchell, MD;  Location: [facility] OR;  Service: Vascular;;   LIGATION OF ARTERIOVENOUS  FISTULA Right 05/07/2019   Procedure: LIGATION OF ARTERIOVENOUS  FISTULA RIGHT ARM;  Surgeon: Serafina Mitchell, MD;  Location: [facility] OR;  Service: Vascular;  Laterality: Right;   NEPHRECTOMY TRANSPLANTED ORGAN     PERIPHERAL VASCULAR BALLOON ANGIOPLASTY  07/22/2018   Procedure: PERIPHERAL VASCULAR BALLOON ANGIOPLASTY;  Surgeon: Serafina Mitchell, MD;   Location: [place] CV LAB;  Service: Cardiovascular;;  Right [place] fistula    PERIPHERAL VASCULAR BALLOON ANGIOPLASTY Right 02/03/2019   Procedure: PERIPHERAL VASCULAR BALLOON ANGIOPLASTY;  Surgeon: Serafina Mitchell, MD;  Location: [place] CV LAB;  Service: Cardiovascular;  Laterality: Right;  RUE AVF   REFRACTIVE SURGERY Bilateral     SOCIAL HISTORY:  reports that he has quit smoking. His smoking use included cigarettes. He has never used smokeless tobacco. He reports that he does not currently use alcohol. He reports that he does not use drugs.  No Known Allergies  FAMILY HISTORY: Family History  Problem Relation Age of Onset   Diabetes Mother    Hypertension Mother    Hyperlipidemia Mother    Diabetes Father    Heart disease Father    Cancer Father    Hypertension Father    Hyperlipidemia Father    Stroke Father    Diabetes Sister    Hypertension Sister    Diabetes Brother    Hypertension Brother    Cancer Brother      Prior to Admission medications   Medication Sig Start Date End Date Taking? Authorizing Provider  acetaminophen (TYLENOL) 500 MG tablet Take 1,000 mg by mouth every 6 (six) hours as needed for moderate pain.   Yes [provider]  albuterol (VENTOLIN HFA) 108 (90 Base) MCG/ACT inhaler Inhale 2 puffs into the lungs every 6 (six) hours as needed for wheezing or shortness of breath.   Yes [provider]  amLODipine (NORVASC) 10 MG tablet Take 1 tablet (10 mg total) by mouth daily. 09/07/20  Yes Emokpae, Courage, MD  ascorbic acid (VITAMIN C) 500 MG tablet Take 1 tablet (500 mg total) by mouth daily. 09/08/20  Yes Roxan Hockey, MD  azaTHIOprine (IMURAN) 50 MG tablet Take 1 tablet (50 mg total) by mouth daily. 09/07/20  Yes Emokpae, Courage, MD  calcium acetate (PHOSLO) 667 MG capsule Take 3 capsules by mouth 3 (three) times daily. 03/29/21  Yes [provider]  guaiFENesin-dextromethorphan (ROBITUSSIN DM) 100-10 MG/5ML syrup Take  10 mLs by mouth every 4 (four) hours as needed for cough. 09/07/20  Yes Emokpae, Courage, MD  labetalol (NORMODYNE) 200 MG tablet Take 1 tablet (200 mg total) by mouth 3 (three) times daily. 09/07/20  Yes Emokpae, Courage, MD  LAGEVRIO 200 MG CAPS capsule Take 4 capsules by mouth 2 (two) times daily. 04/27/21  Yes [provider]  LANTUS SOLOSTAR 100 UNIT/ML Solostar Pen Inject 40 Units into the skin at bedtime. Patient taking differently: Inject 30 Units into the skin at bedtime. 08/09/20  Yes Nida, Marella Chimes, MD  NOVOLOG FLEXPEN 100 UNIT/ML FlexPen Inject 8-14 Units into the skin 3 (three) times daily before meals. Patient taking differently: Inject 8-10 Units into the skin 3 (three) times daily before meals. 08/09/20  Yes Cassandria Anger, MD  pantoprazole (PROTONIX) 40 MG tablet Take 40 mg by mouth daily. 07/01/19  Yes [provider]  Tacrolimus ER 4 MG TB24 Take 16 mg by mouth daily in the afternoon.  09/07/20  Yes Emokpae, Courage, MD  zinc sulfate 220 (50 Zn) MG capsule Take 1 capsule (220 mg total) by mouth daily. 09/08/20  Yes Roxan Hockey, MD    Physical Exam: Vitals:   05/02/21 1121 05/02/21 1130 05/02/21 1309 05/02/21 1330  BP:  (!) 181/73 (!) 148/114 (!) 146/103  Pulse:  61 (!) 54 (!) 57  Resp:  (!) 29 (!) 26 17  TempSrc:      SpO2:  95% 94% 98%  Weight: 89.4 kg     Height: '5\' 7"'$  (1.702 m)         Constitutional: NAD, calm, comfortable Eyes: PERRL, lids and conjunctivae normal ENMT: Mucous membranes are moist. Posterior pharynx clear of any exudate or lesions.Normal dentition.  Neck: normal, supple, no masses, no thyromegaly Respiratory: Some bibasilar crackles Cardiovascular: Regular rate and rhythm, no murmurs / rubs / gallops. No extremity edema. 2+ pedal pulses. No carotid bruits.  Abdomen: no tenderness, no masses palpated. No hepatosplenomegaly. Bowel sounds positive.  Musculoskeletal: no clubbing / cyanosis. No joint deformity upper and  lower extremities. Good ROM, no contractures. Normal muscle tone.  Skin: no rashes, lesions, ulcers. No induration Neurologic: CN 2-12 grossly intact. Sensation intact, DTR normal. Strength 5/5 in all 4.  Psychiatric: Normal judgment and insight. Alert and oriented x 3. Normal mood.   Left upper extremity AV fistula  Labs on Admission: I have personally reviewed following labs and imaging studies  CBC: Recent Labs  Lab 05/02/21 1144  WBC 4.0  NEUTROABS 2.8  HGB 7.4*  HCT 24.4*  MCV 83.6  PLT 123XX123*   Basic Metabolic Panel: Recent Labs  Lab 05/02/21 1144  NA 138  K 5.4*  CL 113*  CO2 15*  GLUCOSE 95  BUN 126*  CREATININE 18.49*  CALCIUM 8.3*  MG 2.9*   GFR: Estimated Creatinine Clearance: 4.7 mL/min (A) (by C-G formula based on SCr of 18.49 mg/dL (H)). Liver Function Tests: Recent Labs  Lab 05/02/21 1144  AST 12*  ALT 16  ALKPHOS 110  BILITOT 1.0  PROT 7.2  ALBUMIN 4.0   No results for input(s): LIPASE, AMYLASE in the last 168 hours. No results for input(s): AMMONIA in the last 168 hours. Coagulation Profile: No results for input(s): INR, PROTIME in the last 168 hours. Cardiac Enzymes: No results for input(s): CKTOTAL, CKMB, CKMBINDEX, TROPONINI in the last 168 hours. BNP (last 3 results) No results for input(s): PROBNP in the last 8760 hours. HbA1C: No results for input(s): HGBA1C in the last 72 hours. CBG: No results for input(s): GLUCAP in the last 168 hours. Lipid Profile: No results for input(s): CHOL, HDL, LDLCALC, TRIG, CHOLHDL, LDLDIRECT in the last 72 hours. Thyroid Function Tests: No results for input(s): TSH, T4TOTAL, FREET4, T3FREE, THYROIDAB in the last 72 hours. Anemia Panel: No results for input(s): VITAMINB12, FOLATE, FERRITIN, TIBC, IRON, RETICCTPCT in the last 72 hours. Urine analysis: No results found for: COLORURINE, APPEARANCEUR, LABSPEC, PHURINE, GLUCOSEU, HGBUR, BILIRUBINUR, KETONESUR, PROTEINUR, UROBILINOGEN, NITRITE,  LEUKOCYTESUR Sepsis Labs: !!!!!!!!!!!!!!!!!!!!!!!!!!!!!!!!!!!!!!!!!!!! '@LABRCNTIP'$ (procalcitonin:4,lacticidven:4) )No results found for this or any previous visit (from the past 240 hour(s)).   Radiological Exams on Admission: DG Chest Port 1 View  Result Date: 05/02/2021 CLINICAL DATA:  Edema, positive COVID test EXAM: PORTABLE CHEST 1 VIEW COMPARISON:  Chest radiograph 09/05/2020 FINDINGS: The heart is enlarged, unchanged. The mediastinal contours are stable. The pulmonary arteries are prominent. There are increased interstitial markings throughout both lungs consistent with vascular congestion. There may be mild pulmonary interstitial edema. There  is blunting of the costophrenic angles which is unchanged going back to studies from 2020, favored to reflect scarring. There is no right effusion. There is no pneumothorax There is no acute osseous abnormality. IMPRESSION: Cardiomegaly with vascular congestion and possible mild pulmonary interstitial edema. Electronically Signed   By: Valetta Mole M.D.   On: 05/02/2021 12:58     All images have been reviewed by me personally.  EKG: Independently reviewed.  No peaked T waves noted  Assessment/Plan Principal Problem:   Acute respiratory distress Active Problems:   Diabetes mellitus with end-stage renal disease (HCC)   ESRD (end stage renal disease) on dialysis (HCC)   Benign essential HTN   ESRD on hemodialysis ([place])   COVID-19 virus infection   Fluid overload    Acute respiratory distress secondary to pulmonary edema and volume overload ESRD on HD MF Failed renal transplant -Admit patient to the hospital.  Nephrology consulted for hemodialysis.  He has mild hyperkalemia without EKG changes which should improve with hemodialysis.  COVID-19 infection - Mainly asymptomatic.  Molnupiravir, still has 3 doses left to complete 5 day course.  As needed bronchodilators, I-S/flutter  Diabetes mellitus type 2, insulin-dependent - Lantus 20 units  daily, sliding scale and Accu-Cheks  Anemia - Hemoglobin 7.4 (baseline 8.0).  Likely from chronic disease, refused rectal exam.  Check iron studies, B12 and folate No active blood loss.   Essential hypertension - Continue Norvasc, labetalol.  IV hydralazine as needed  GERD - PPI    DVT prophylaxis: Subcu heparin Code Status: Full code Family Communication: None Consults called: Nephrology Admission status: Admit inpatient for multiple sessions of dialysis  Status is: Inpatient  Remains inpatient appropriate because:Inpatient level of care appropriate due to severity of illness  Dispo: The patient is from: Home              Anticipated d/c is to: Home              Patient currently is not medically stable to d/c.   Difficult to place patient No       Time Spent: 65 minutes.  >50% of the time was devoted to discussing the patients care, assessment, plan and disposition with other care givers along with counseling the patient about the risks and benefits of treatment.    Earlee Herald Arsenio Loader MD [facility]  If 7PM-7AM, please contact night-coverage   05/02/2021, 2:56 PM

## 2021-05-02 NOTE — ED Triage Notes (Signed)
Pt arrived via EMS. COVID + 09/14. Last dialysis was 09/11. Pt states his Hgb was 8.0 at last dialysis and feels like it is lower now. Pt has not been able to go to dialysis since testing positive for COVID.

## 2021-05-02 NOTE — Procedures (Signed)
   HEMODIALYSIS TREATMENT NOTE:   Uneventful 2.5 hour low-heparin treatment completed via left upper arm AVF (17g/antegade with Qb 250). Goal met: 1.2 liters removed.  UF was interrupted x 15 minutes for c/o abdominal cramps.  All blood was returned and hemostasis was achieved in 15 minutes.  No changes from pre-HD assessment.  Rockwell Alexandria, RN   Hepatitis B Surface Antibody NonReactive Reactive Abnormal    Hepatitis B Surface Antibody Quant mIU/mL 44   Resulting Agency  [place]  Narrative Performed by [place] Interpretive Data   The accepted criteria for immunity to HBV is anti-HBs activity >= 10 mIU/mL, as defined by the [facility] International Reference Preparation. Specimen Collected: 12/06/20 08:13 Last Resulted: 12/07/20 13:15  Received From: [place]  Result Received: 05/02/21 09:19

## 2021-05-03 DIAGNOSIS — E8779 Other fluid overload: Secondary | ICD-10-CM | POA: Diagnosis not present

## 2021-05-03 DIAGNOSIS — U071 COVID-19: Secondary | ICD-10-CM

## 2021-05-03 DIAGNOSIS — I10 Essential (primary) hypertension: Secondary | ICD-10-CM | POA: Diagnosis not present

## 2021-05-03 DIAGNOSIS — N186 End stage renal disease: Secondary | ICD-10-CM | POA: Diagnosis not present

## 2021-05-03 DIAGNOSIS — Z992 Dependence on renal dialysis: Secondary | ICD-10-CM

## 2021-05-03 DIAGNOSIS — E875 Hyperkalemia: Secondary | ICD-10-CM

## 2021-05-03 LAB — HEPATITIS B SURFACE ANTIGEN: Hepatitis B Surface Ag: NONREACTIVE

## 2021-05-03 LAB — FOLATE RBC
Folate, Hemolysate: 225 ng/mL
Folate, RBC: 941 ng/mL (ref >498)
Hematocrit: 23.9 % — ABNORMAL LOW (ref 37.5–51.0)

## 2021-05-03 LAB — GLUCOSE, CAPILLARY
Glucose-Capillary: 100 mg/dL — ABNORMAL HIGH (ref 70–99)
Glucose-Capillary: 113 mg/dL — ABNORMAL HIGH (ref 70–99)
Glucose-Capillary: 160 mg/dL — ABNORMAL HIGH (ref 70–99)
Glucose-Capillary: 172 mg/dL — ABNORMAL HIGH (ref 70–99)
Glucose-Capillary: 271 mg/dL — ABNORMAL HIGH (ref 70–99)

## 2021-05-03 LAB — RENAL FUNCTION PANEL
Albumin: 3.7 g/dL (ref 3.5–5.0)
Anion gap: 10 (ref 5–15)
BUN: 81 mg/dL — ABNORMAL HIGH (ref 6–20)
CO2: 21 mmol/L — ABNORMAL LOW (ref 22–32)
Calcium: 7.8 mg/dL — ABNORMAL LOW (ref 8.9–10.3)
Chloride: 101 mmol/L (ref 98–111)
Creatinine, Ser: 13.41 mg/dL — ABNORMAL HIGH (ref 0.61–1.24)
GFR, Estimated: 4 mL/min — ABNORMAL LOW (ref >60)
Glucose, Bld: 135 mg/dL — ABNORMAL HIGH (ref 70–99)
Phosphorus: 3.8 mg/dL (ref 2.5–4.6)
Potassium: 3.3 mmol/L — ABNORMAL LOW (ref 3.5–5.1)
Sodium: 132 mmol/L — ABNORMAL LOW (ref 135–145)

## 2021-05-03 LAB — CBC
HCT: 22.8 % — ABNORMAL LOW (ref 39.0–52.0)
Hemoglobin: 7 g/dL — ABNORMAL LOW (ref 13.0–17.0)
MCH: 25.3 pg — ABNORMAL LOW (ref 26.0–34.0)
MCHC: 30.7 g/dL (ref 30.0–36.0)
MCV: 82.3 fL (ref 80.0–100.0)
Platelets: 123 10*3/uL — ABNORMAL LOW (ref 150–400)
RBC: 2.77 MIL/uL — ABNORMAL LOW (ref 4.22–5.81)
RDW: 16.6 % — ABNORMAL HIGH (ref 11.5–15.5)
WBC: 3.4 10*3/uL — ABNORMAL LOW (ref 4.0–10.5)
nRBC: 0 % (ref 0.0–0.2)

## 2021-05-03 LAB — FERRITIN: Ferritin: 632 ng/mL — ABNORMAL HIGH (ref 24–336)

## 2021-05-03 LAB — D-DIMER, QUANTITATIVE: D-Dimer, Quant: 3.23 ug/mL-FEU — ABNORMAL HIGH (ref 0.00–0.50)

## 2021-05-03 LAB — MAGNESIUM: Magnesium: 2.4 mg/dL (ref 1.7–2.4)

## 2021-05-03 LAB — C-REACTIVE PROTEIN: CRP: 0.7 mg/dL (ref <1.0)

## 2021-05-03 LAB — TROPONIN I (HIGH SENSITIVITY)
Troponin I (High Sensitivity): 15 ng/L (ref <18)
Troponin I (High Sensitivity): 17 ng/L (ref <18)

## 2021-05-03 LAB — HEPATITIS B SURFACE ANTIBODY, QUANTITATIVE: Hep B S AB Quant (Post): 73.7 m[IU]/mL (ref >9.9)

## 2021-05-03 LAB — HEPATITIS B SURFACE ANTIBODY,QUALITATIVE: Hep B S Ab: REACTIVE — AB

## 2021-05-03 MED ORDER — MORPHINE SULFATE (PF) 4 MG/ML IV SOLN
4.0000 mg | Freq: Once | INTRAVENOUS | Status: AC
  Administered 2021-05-03: 4 mg via INTRAVENOUS
  Filled 2021-05-03: qty 1

## 2021-05-03 MED ORDER — METHYLPREDNISOLONE SODIUM SUCC 125 MG IJ SOLR
60.0000 mg | Freq: Two times a day (BID) | INTRAMUSCULAR | Status: DC
  Administered 2021-05-03 – 2021-05-04 (×2): 60 mg via INTRAVENOUS
  Filled 2021-05-03 (×2): qty 2

## 2021-05-03 NOTE — Procedures (Signed)
   HEMODIALYSIS TREATMENT NOTE:  3 hour heparin-free treatment completed via left upper arm AVF (17g/antegrade). Goal met: 1.8 liters removed without interruption in UF.    When sleeping, spO2 drops to 87%.  Saturating 97% on 1L O2 via [place].   All blood was returned and hemostasis was achieved in 20 minutes.  No changes from pre-HD assessment.     Hepatitis B surface antibody,quantitative [[id number]] Collected: 05/02/21 1144  Specimen: Blood Updated: 05/03/21 0636   Hepatitis B-Post 73.7 mIU/mL        Rockwell Alexandria, RN

## 2021-05-03 NOTE — Progress Notes (Signed)
Inpatient Diabetes Program Recommendations  AACE/ADA: New Consensus Statement on Inpatient Glycemic Control (2015)  Target Ranges:  Prepandial:   less than 140 mg/dL      Peak postprandial:   less than 180 mg/dL (1-2 hours)      Critically ill patients:  140 - 180 mg/dL   Lab Results  Component Value Date   GLUCAP 100 (H) 05/03/2021   HGBA1C 5.9 (H) 05/02/2021    Review of Glycemic Control Results for Alejandro Lee, Alejandro Lee (MRN [id number]) as of 05/03/2021 10:09  Ref. Range 05/02/2021 20:59 05/02/2021 21:15 05/02/2021 21:42 05/03/2021 00:20 05/03/2021 07:37  Glucose-Capillary Latest Ref Range: 70 - 99 mg/dL 62 (L) 68 (L) 105 (H) 160 (H) 100 (H)   Diabetes history: DM 2 Outpatient Diabetes medications:  Lantus 30 units daily (no longer taking) Novolog 8-10 units tid with meals Current orders for Inpatient glycemic control:  Novolog sensitive tid with meals and HS  Inpatient Diabetes Program Recommendations:    Please reduce Novolog to "very sensitive"  0-6 units tid with meals.   Thanks,  Adah Perl, RN, BC-ADM Inpatient Diabetes Coordinator Pager [phone]  (8a-5p)

## 2021-05-03 NOTE — Progress Notes (Signed)
Patient ID: Alejandro Lee, male   DOB: [birth year], 60 y.o.   MRN: [id number] S: HD yesterday with only 1.2 liters uf due to abdominal cramps.  Near his outpatient edw. O:BP 129/85   Pulse 70   Temp 98.9 F (37.2 C)   Resp 20   Ht '5\' 7"'$  (1.702 m)   Wt 89.6 kg   SpO2 97%   BMI 30.94 kg/m   Intake/Output Summary (Last 24 hours) at 05/03/2021 1218 Last data filed at 05/03/2021 0900 Gross per 24 hour  Intake 1280 ml  Output 300 ml  Net 980 ml   Intake/Output: I/O last 3 completed shifts: In: 920 [P.O.:920] Out: 300 [Urine:300]  Intake/Output this shift:  Total I/O In: 360 [P.O.:360] Out: -  Weight change:  Physical exam: unable to complete due to COVID + status.  In order to preserve PPE equipment and to minimize exposure to providers.  Notes from other caregivers reviewed   Recent Labs  Lab 05/02/21 1144 05/03/21 0147  NA 138 132*  K 5.4* 3.3*  CL 113* 101  CO2 15* 21*  GLUCOSE 95 135*  BUN 126* 81*  CREATININE 18.49* 13.41*  ALBUMIN 4.0 3.7  CALCIUM 8.3* 7.8*  PHOS  --  3.8  AST 12*  --   ALT 16  --    Liver Function Tests: Recent Labs  Lab 05/02/21 1144 05/03/21 0147  AST 12*  --   ALT 16  --   ALKPHOS 110  --   BILITOT 1.0  --   PROT 7.2  --   ALBUMIN 4.0 3.7   No results for input(s): LIPASE, AMYLASE in the last 168 hours. No results for input(s): AMMONIA in the last 168 hours. CBC: Recent Labs  Lab 05/02/21 1144 05/03/21 0147  WBC 4.0 3.4*  NEUTROABS 2.8  --   HGB 7.4* 7.0*  HCT 24.4* 22.8*  MCV 83.6 82.3  PLT 117* 123*   Cardiac Enzymes: No results for input(s): CKTOTAL, CKMB, CKMBINDEX, TROPONINI in the last 168 hours. CBG: Recent Labs  Lab 05/02/21 2115 05/02/21 2142 05/03/21 0020 05/03/21 0737 05/03/21 1100  GLUCAP 68* 105* 160* 100* 172*    Iron Studies:  Recent Labs    05/02/21 1437 05/03/21 0927  IRON 93  --   TIBC 200*  --   FERRITIN 591* 632*   Studies/Results: DG Chest Port 1 View  Result Date:  05/02/2021 CLINICAL DATA:  Edema, positive COVID test EXAM: PORTABLE CHEST 1 VIEW COMPARISON:  Chest radiograph 09/05/2020 FINDINGS: The heart is enlarged, unchanged. The mediastinal contours are stable. The pulmonary arteries are prominent. There are increased interstitial markings throughout both lungs consistent with vascular congestion. There may be mild pulmonary interstitial edema. There is blunting of the costophrenic angles which is unchanged going back to studies from 2020, favored to reflect scarring. There is no right effusion. There is no pneumothorax There is no acute osseous abnormality. IMPRESSION: Cardiomegaly with vascular congestion and possible mild pulmonary interstitial edema. Electronically Signed   By: Valetta Mole M.D.   On: 05/02/2021 12:58    amLODipine  10 mg Oral Daily   calcium acetate  2,001 mg Oral TID WC   Chlorhexidine Gluconate Cloth  6 each Topical Q0600   darbepoetin (ARANESP) injection - DIALYSIS  60 mcg Intravenous Q Tue-HD   heparin  5,000 Units Subcutaneous Q8H   insulin aspart  0-5 Units Subcutaneous QHS   insulin aspart  0-9 Units Subcutaneous TID WC   labetalol  200  mg Oral TID   methylPREDNISolone (SOLU-MEDROL) injection  60 mg Intravenous Q12H   pantoprazole  40 mg Oral Daily    BMET    Component Value Date/Time   NA 132 (L) 05/03/2021 0147   K 3.3 (L) 05/03/2021 0147   CL 101 05/03/2021 0147   CO2 21 (L) 05/03/2021 0147   GLUCOSE 135 (H) 05/03/2021 0147   BUN 81 (H) 05/03/2021 0147   CREATININE 13.41 (H) 05/03/2021 0147   CALCIUM 7.8 (L) 05/03/2021 0147   GFRNONAA 4 (L) 05/03/2021 0147   GFRAA 14 (L) 08/08/2019 1623   CBC    Component Value Date/Time   WBC 3.4 (L) 05/03/2021 0147   RBC 2.77 (L) 05/03/2021 0147   HGB 7.0 (L) 05/03/2021 0147   HCT 22.8 (L) 05/03/2021 0147   PLT 123 (L) 05/03/2021 0147   MCV 82.3 05/03/2021 0147   MCH 25.3 (L) 05/03/2021 0147   MCHC 30.7 05/03/2021 0147   RDW 16.6 (H) 05/03/2021 0147   LYMPHSABS 0.7  05/02/2021 1144   MONOABS 0.2 05/02/2021 1144   EOSABS 0.2 05/02/2021 1144   BASOSABS 0.0 05/02/2021 1144    Dialysis Access: LUE AVF   Dialysis Orders: Center: [facility]  on Monday and Friday but since covid + [facility] . EDW 89kg HD [place] 2K/2.5Ca  Time 240 min Heparin 2000 units IV bolus, then 1000 units/hr. Access LUE AVF BFR 350 DFR 500    Calcitriol 2 mcg po/HD Epogen 8400  Units IV/HD  Venofer  50 mg IV/HD      Assessment/Plan:  Pulmonary edema - due to missed HD and volume overload.  Improved with HD and plan for HD again today, UF as tolerated. Hyperkalemia - due to missed HD, resolved with HD.  ESRD -  will need to treat him as a new start given significant azotemia, to avoid dialysis dysequilibrium.  Plan short slow HD today.  Will need outpatient transportation for HD on Friday to get to his outpatient HD in [place].  Hypertension/volume  - as above, UF with HD  Anemia  - missed ESA doses for past week and an half.  Dosed Aranesp 9/20 with HD.  Metabolic bone disease -  continue with home meds  Nutrition - renal, carb modified diet Disposition - will need SW assistance arranging for outpatient HD to [facility].  I spoke with the covid unit and they can take him on Friday if he can get transportation, however his normal service wont take him due to covid +.  Donetta Potts, MD [employer] [phone]

## 2021-05-03 NOTE — Progress Notes (Signed)
Pt complaining of 10/10 sharp/squeezing left sided chest pain. Dr.Ghosh notified. Stat EKG performed. Dr.Ghosh evaluated pt bedside. Verbal order for pain medication and Troponin x1- See orders. VSS at this time. EKG in chart. Will continue to monitor.

## 2021-05-03 NOTE — TOC Initial Note (Signed)
Transition of Care [facility]) - Initial/Assessment Note    Patient Details  Name: Alejandro Lee MRN: [id number] Date of Birth: [birth year]  Transition of Care [facility]) CM/SW Contact:    Alejandro Gully, LCSW Phone Number: 05/03/2021, 5:01 PM  Clinical Narrative:                 Patient from home with spouse. COVID+ on 04/26/21 at [facility] in [place] per Alejandro Lee. At baseline, goes to [facility]. Was told he'd have to go to [facility] due to being COVID+ but that was too far for [facility] per spouse. Then patient was supposed to go to the [facility], however he did not tell his spouse that the appointment was on Wednesday and she did not take him and [facility] will not transport due to COVID status. Alejandro Lee advised that they only get a check once per month and she cannot afford to take patient distances three times per week for HD.  Call to Alejandro Lee to discuss patient's case, no answer and social worker mail box was full. Call to [facility] and message left for social worker to gain clarity.  TOC will continue to attempt to reach Alejandro Lee.  Expected Discharge Plan: Home/Self Care Barriers to Discharge: Continued Medical Work up   Patient Goals and CMS Choice Patient states their goals for this hospitalization and ongoing recovery are:: none      Expected Discharge Plan and Services Expected Discharge Plan: Home/Self Care                                              Prior Living Arrangements/Services   Lives with:: Spouse                   Activities of Daily Living      Permission Sought/Granted                  Emotional Assessment              Admission diagnosis:  Shortness of breath [R06.02] Hyperkalemia [E87.5] Hypermagnesemia [E83.41] Fluid overload [E87.70] Uremia due to inadequate renal perfusion [N19] Patient Active Problem List   Diagnosis Date Noted   Acute respiratory distress 05/02/2021   COVID-19 virus  infection 05/02/2021   Fluid overload 05/02/2021   Pneumonia due to COVID-19 virus 09/05/2020   ESRD on hemodialysis (HCC)    Hyperkalemia 06/25/2018   Non-compliance with renal dialysis ([place]) 06/25/2018   Generalized abdominal pain 06/25/2018   Chronic back pain 06/25/2018   GERD (gastroesophageal reflux disease) 06/25/2018   Chronic diastolic HF (heart failure) ([place]) 06/25/2018   Class 1 obesity due to excess calories in adult 06/25/2018   Benign essential HTN 06/25/2018   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[place]) 12/25/2017   Diabetes mellitus with end-stage renal disease ([place]) 12/19/2017   ESRD (end stage renal disease) on dialysis ([place]) 12/19/2017   Chronic right-sided low back pain without sciatica 12/19/2017   Pseudophakia of left eye 09/03/2017   PDR (proliferative diabetic retinopathy) ([place]) 09/03/2017   Cataract in degenerative disorder 09/03/2017   Unspecified complication of internal prosthetic device, implant and graft, initial encounter 05/17/2017   Rectal abscess 05/17/2017   Osteopathy in diseases classified elsewhere, unspecified site [id number]   Metabolic disorder [id number]   Hypertension 05/16/2017   Dependence on renal dialysis ([place]) 05/16/2017  Anemia of chronic renal failure, stage 5 (HCC) 05/16/2017   Retinal detachment, tractional, both eyes 06/19/2012   PCP:  Monico Blitz, MD Pharmacy:   [id number] [place], [id number]: [phone]: [phone]    Social Determinants of Health (SDOH) Interventions    Readmission Risk Interventions No flowsheet data found.

## 2021-05-03 NOTE — Progress Notes (Signed)
Potassium 3.3. Dr. Orlin Hilding notified.

## 2021-05-03 NOTE — Progress Notes (Signed)
PROGRESS NOTE  Alejandro Lee [id number] DOB: 1961-02-08 DOA: 05/02/2021 PCP: Monico Blitz, MD  Brief History:  60 year old male with a history of diabetes mellitus type 2, hypertension, ESRD (Mon/Fri HD--[facility]), failed renal transplant, hyperlipidemia, DVT presenting with 2 to 3-day history of worsening shortness of breath and orthopnea type symptoms.  The patient denied any fevers, chills, headache, neck pain, chest pain, nausea, vomiting, diarrhea, abdominal pain.  Notably, the patient has been having some nonproductive cough and chest congestion that began about 1 week prior to this admission.  He went to see his PCP on 04/27/2021 and was prescribed molnupiravir which he began taking on 04/28/2021.  Unfortunately, the patient began having increasing shortness of breath around 04/30/2021.  He states that his usual transportation to dialysis refused to take him because he was COVID-positive.  As result, the patient had not been dialyzed since 04/24/2021.  Apparently, the patient was transferred to the dialysis unit at [facility].  He was scheduled for dialysis on 04/28/2021, but could not find transportation.  Patient is having trouble financially and therefore is not able to care for himself. The patient otherwise denies any hemoptysis, nausea, vomiting, diarrhea, abdominal pain, hematochezia, melena. In the emergency department, the patient was afebrile and hemodynamically stable with oxygen saturation 94% on room air.  BMP showed sodium 138, potassium 5.4, serum creatinine 18.49.  WBC 4.0, hemoglobin 10.4, platelets 117,000.  Nephrology was consulted and the patient was dialyzed on 05/02/2021.  Assessment/Plan: Pulmonary edema/fluid overload -Secondary to missed dialysis -Appreciate nephrology follow-up -Plans noted for dialysis--to dialyze as a new start to avoid disequilibrium -Planning dialysis again 05/03/2021  ESRD -Patient will need to have dialysis transportation  arranged for [id number] -Metabolic bone disease per nephrology -Patient states that he no longer takes tacrolimus and Imuran  Hypertensive urgency -Elevated hypertension in part due to the patient's use of dextromethorphan in the setting of ESRD -Continue amlodipine and labetalol  COVID-19 infection -Finished 5-day course of molnupiravir -stable on RA  Anemia of CKD -patient has missed ESA for past 1-1/2 weeks -Aranesp per renal  Diabetes mellitus type 2, controlled -05/02/2021 hemoglobin A1c 5.9 -Patient states that he no longer takes Lantus -Continue NovoLog sliding scale  Hyperkalemia -Corrected per dialysis  Atypical Chest pain -personally reviewed EKG--sinus, no STT change -finish cycling troponin -D-dimer    Status is: Inpatient  Remains inpatient appropriate because:Inpatient level of care appropriate due to severity of illness  Dispo: The patient is from: Home              Anticipated d/c is to: Home              Patient currently is not medically stable to d/c.   Difficult to place patient No        Family Communication:   no Family at bedside  Consultants:  renal  Code Status:  FULL  DVT Prophylaxis:  [place] Heparin   Procedures: As Listed in Progress Note Above  Antibiotics: None      Subjective: Patient had an episode of chest pain last evening with an associated nausea and vomiting.  He states that his chest pain is better this morning as well as his nausea and vomiting.  She denies any fevers, chills, headache, abdominal pain, diarrhea, hematochezia, melena.  Objective: Vitals:   05/02/21 2117 05/03/21 0109 05/03/21 0500 05/03/21 0543  BP: (!) 165/69 (!) 158/63  129/85  Pulse: 79 88  70  Resp: 18 20    Temp: (!) 97.4 F (36.3 C) 97.8 F (36.6 C)  98.9 F (37.2 C)  TempSrc: Oral Oral    SpO2: 94% 97%    Weight:   89.6 kg   Height:        Intake/Output Summary (Last 24 hours) at 05/03/2021 [id number] Last data filed at 05/03/2021  0500 Gross per 24 hour  Intake 920 ml  Output 300 ml  Net 620 ml   Weight change:  Exam:  General:  Pt is alert, follows commands appropriately, not in acute distress HEENT: No icterus, No thrush, No neck mass, [place]/AT Cardiovascular: RRR, S1/S2, no rubs, no gallops Respiratory: Fine bibasilar rales but no wheezing.  Good air movement Abdomen: Soft/+BS, non tender, non distended, no guarding Extremities: No edema, No lymphangitis, No petechiae, No rashes, no synovitis   Data Reviewed: I have personally reviewed following labs and imaging studies Basic Metabolic Panel: Recent Labs  Lab 05/02/21 1144 05/03/21 0147  NA 138 132*  K 5.4* 3.3*  CL 113* 101  CO2 15* 21*  GLUCOSE 95 135*  BUN 126* 81*  CREATININE 18.49* 13.41*  CALCIUM 8.3* 7.8*  MG 2.9* 2.4  PHOS  --  3.8   Liver Function Tests: Recent Labs  Lab 05/02/21 1144 05/03/21 0147  AST 12*  --   ALT 16  --   ALKPHOS 110  --   BILITOT 1.0  --   PROT 7.2  --   ALBUMIN 4.0 3.7   No results for input(s): LIPASE, AMYLASE in the last 168 hours. No results for input(s): AMMONIA in the last 168 hours. Coagulation Profile: No results for input(s): INR, PROTIME in the last 168 hours. CBC: Recent Labs  Lab 05/02/21 1144 05/03/21 0147  WBC 4.0 3.4*  NEUTROABS 2.8  --   HGB 7.4* 7.0*  HCT 24.4* 22.8*  MCV 83.6 82.3  PLT 117* 123*   Cardiac Enzymes: No results for input(s): CKTOTAL, CKMB, CKMBINDEX, TROPONINI in the last 168 hours. BNP: Invalid input(s): POCBNP CBG: Recent Labs  Lab 05/02/21 2059 05/02/21 2115 05/02/21 2142 05/03/21 0020 05/03/21 0737  GLUCAP 62* 68* 105* 160* 100*   HbA1C: Recent Labs    05/02/21 1437  HGBA1C 5.9*   Urine analysis: No results found for: COLORURINE, APPEARANCEUR, LABSPEC, PHURINE, GLUCOSEU, HGBUR, BILIRUBINUR, KETONESUR, PROTEINUR, UROBILINOGEN, NITRITE, LEUKOCYTESUR Sepsis Labs: '@LABRCNTIP'$ (procalcitonin:4,lacticidven:4) ) Recent Results (from the past 240  hour(s))  Resp Panel by RT-PCR (Flu A&B, Covid) Nasopharyngeal Swab     Status: Abnormal   Collection Time: 05/02/21  1:54 PM   Specimen: Nasopharyngeal Swab; Nasopharyngeal(NP) swabs in vial transport medium  Result Value Ref Range Status   SARS Coronavirus 2 by RT PCR POSITIVE (A) NEGATIVE Final    Comment: CRITICAL RESULT CALLED TO, READ BACK BY AND VERIFIED WITH: LONG,L AT 1539 ON 9.20.22 BY RUCINSKI,B (NOTE) SARS-CoV-2 target nucleic acids are DETECTED.  The SARS-CoV-2 RNA is generally detectable in upper respiratory specimens during the acute phase of infection. Positive results are indicative of the presence of the identified virus, but do not rule out bacterial infection or co-infection with other pathogens not detected by the test. Clinical correlation with patient history and other diagnostic information is necessary to determine patient infection status. The expected result is Negative.  Fact Sheet for Patients: EntrepreneurPulse.com.au  Fact Sheet for Healthcare Providers: IncredibleEmployment.be  This test is not yet approved or cleared by the [place] FDA and  has been authorized for detection and/or diagnosis  of SARS-CoV-2 by FDA under an Emergency Use Authorization (EUA).  This EUA will remain in effect (meaning t his test can be used) for the duration of  the COVID-19 declaration under Section 564(b)(1) of the Act, 21 U.S.C. section 360bbb-3(b)(1), unless the authorization is terminated or revoked sooner.     Influenza A by PCR NEGATIVE NEGATIVE Final   Influenza B by PCR NEGATIVE NEGATIVE Final    Comment: (NOTE) The Xpert Xpress SARS-CoV-2/FLU/RSV plus assay is intended as an aid in the diagnosis of influenza from Nasopharyngeal swab specimens and should not be used as a sole basis for treatment. Nasal washings and aspirates are unacceptable for Xpert Xpress SARS-CoV-2/FLU/RSV testing.  Fact Sheet for  Patients: EntrepreneurPulse.com.au  Fact Sheet for Healthcare Providers: IncredibleEmployment.be  This test is not yet approved or cleared by the [place] FDA and has been authorized for detection and/or diagnosis of SARS-CoV-2 by FDA under an Emergency Use Authorization (EUA). This EUA will remain in effect (meaning this test can be used) for the duration of the COVID-19 declaration under Section 564(b)(1) of the Act, 21 U.S.C. section 360bbb-3(b)(1), unless the authorization is terminated or revoked.  Performed at [facility], [address], [place], [place] [address]      Scheduled Meds:  amLODipine  10 mg Oral Daily   calcium acetate  2,001 mg Oral TID WC   Chlorhexidine Gluconate Cloth  6 each Topical Q0600   darbepoetin (ARANESP) injection - DIALYSIS  60 mcg Intravenous Q Tue-HD   heparin  5,000 Units Subcutaneous Q8H   insulin aspart  0-5 Units Subcutaneous QHS   insulin aspart  0-9 Units Subcutaneous TID WC   insulin glargine-yfgn  30 Units Subcutaneous QHS   labetalol  200 mg Oral TID   molnupiravir EUA  4 capsule Oral BID   pantoprazole  40 mg Oral Daily   Continuous Infusions:  sodium chloride     sodium chloride     iron sucrose Stopped (05/02/21 1600)    Procedures/Studies: DG Chest Port 1 View  Result Date: 05/02/2021 CLINICAL DATA:  Edema, positive COVID test EXAM: PORTABLE CHEST 1 VIEW COMPARISON:  Chest radiograph 09/05/2020 FINDINGS: The heart is enlarged, unchanged. The mediastinal contours are stable. The pulmonary arteries are prominent. There are increased interstitial markings throughout both lungs consistent with vascular congestion. There may be mild pulmonary interstitial edema. There is blunting of the costophrenic angles which is unchanged going back to studies from 2020, favored to reflect scarring. There is no right effusion. There is no pneumothorax There is no acute osseous abnormality. IMPRESSION:  Cardiomegaly with vascular congestion and possible mild pulmonary interstitial edema. Electronically Signed   By: Valetta Mole M.D.   On: 05/02/2021 12:58    Orson Eva, DO  [facility]  If 7PM-7AM, please contact night-coverage [email or web address] Password [email or web address] 05/03/2021, 8:22 AM   LOS: 1 day

## 2021-05-03 NOTE — Plan of Care (Signed)
  Problem: Coping: Goal: Psychosocial and spiritual needs will be supported Outcome: Progressing   Problem: Respiratory: Goal: Will maintain a patent airway Outcome: Progressing   

## 2021-05-04 DIAGNOSIS — R0603 Acute respiratory distress: Secondary | ICD-10-CM | POA: Diagnosis not present

## 2021-05-04 LAB — COMPREHENSIVE METABOLIC PANEL
ALT: 15 U/L (ref 0–44)
AST: 13 U/L — ABNORMAL LOW (ref 15–41)
Albumin: 4.1 g/dL (ref 3.5–5.0)
Alkaline Phosphatase: 98 U/L (ref 38–126)
Anion gap: 13 (ref 5–15)
BUN: 59 mg/dL — ABNORMAL HIGH (ref 6–20)
CO2: 23 mmol/L (ref 22–32)
Calcium: 8.5 mg/dL — ABNORMAL LOW (ref 8.9–10.3)
Chloride: 97 mmol/L — ABNORMAL LOW (ref 98–111)
Creatinine, Ser: 10.1 mg/dL — ABNORMAL HIGH (ref 0.61–1.24)
GFR, Estimated: 5 mL/min — ABNORMAL LOW (ref >60)
Glucose, Bld: 143 mg/dL — ABNORMAL HIGH (ref 70–99)
Potassium: 4.2 mmol/L (ref 3.5–5.1)
Sodium: 133 mmol/L — ABNORMAL LOW (ref 135–145)
Total Bilirubin: 0.5 mg/dL (ref 0.3–1.2)
Total Protein: 7.4 g/dL (ref 6.5–8.1)

## 2021-05-04 LAB — CBC WITH DIFFERENTIAL/PLATELET
Abs Immature Granulocytes: 0.05 10*3/uL (ref 0.00–0.07)
Basophils Absolute: 0 10*3/uL (ref 0.0–0.1)
Basophils Relative: 0 %
Eosinophils Absolute: 0 10*3/uL (ref 0.0–0.5)
Eosinophils Relative: 0 %
HCT: 26.6 % — ABNORMAL LOW (ref 39.0–52.0)
Hemoglobin: 7.8 g/dL — ABNORMAL LOW (ref 13.0–17.0)
Immature Granulocytes: 1 %
Lymphocytes Relative: 11 %
Lymphs Abs: 0.5 10*3/uL — ABNORMAL LOW (ref 0.7–4.0)
MCH: 24 pg — ABNORMAL LOW (ref 26.0–34.0)
MCHC: 29.3 g/dL — ABNORMAL LOW (ref 30.0–36.0)
MCV: 81.8 fL (ref 80.0–100.0)
Monocytes Absolute: 0.3 10*3/uL (ref 0.1–1.0)
Monocytes Relative: 6 %
Neutro Abs: 3.6 10*3/uL (ref 1.7–7.7)
Neutrophils Relative %: 82 %
Platelets: 151 10*3/uL (ref 150–400)
RBC: 3.25 MIL/uL — ABNORMAL LOW (ref 4.22–5.81)
RDW: 16.3 % — ABNORMAL HIGH (ref 11.5–15.5)
WBC: 4.4 10*3/uL (ref 4.0–10.5)
nRBC: 0 % (ref 0.0–0.2)

## 2021-05-04 LAB — GLUCOSE, CAPILLARY
Glucose-Capillary: 134 mg/dL — ABNORMAL HIGH (ref 70–99)
Glucose-Capillary: 133 mg/dL — ABNORMAL HIGH (ref 70–99)

## 2021-05-04 LAB — C-REACTIVE PROTEIN: CRP: 0.8 mg/dL (ref <1.0)

## 2021-05-04 LAB — MAGNESIUM: Magnesium: 2.3 mg/dL (ref 1.7–2.4)

## 2021-05-04 NOTE — Progress Notes (Signed)
Nephrology Progress Note:  Patient ID: Alejandro Lee, male   DOB: Apr 11, 1961, 60 y.o.   MRN: [id number]  S: last HD on 9/21 with 1.6 kg UF. He doesn't have transportation set up for dialysis tomorrow.  Per primary team this was just discussed with SW in progression and goal is for discharge today.  He's been on room air.  His grandchildren live with him  Review of systems:  reports shortness of breath denies chest pain Denies nausea/vomiting   O:BP (!) 128/52 (BP Location: Right Arm)   Pulse 70   Temp 99 F (37.2 C) (Oral)   Resp 18 Comment: 18  Ht '5\' 7"'$  (1.702 m)   Wt 89.6 kg   SpO2 98%   BMI 30.94 kg/m   Intake/Output Summary (Last 24 hours) at 05/04/2021 0954 Last data filed at 05/04/2021 0500 Gross per 24 hour  Intake 50 ml  Output 2045 ml  Net -1995 ml   Intake/Output: I/O last 3 completed shifts: In: 1330 [P.O.:1330] Out: 2345 [Urine:700; [id number]  Intake/Output this shift:  No intake/output data recorded. Weight change: 0.441 kg  Physical exam:  General adult male in bed in no acute distress HEENT normocephalic atraumatic extraocular movements intact sclera anicteric Neck supple trachea midline Lungs clear to auscultation bilaterally normal work of breathing at rest on room air Heart S1S2 no rub Abdomen soft nontender nondistended Extremities no edema  Psych normal mood and affect Neuro alert and oriented x 3 provides hx and follows commands Access LUE AVF with thrill     Recent Labs  Lab 05/02/21 1144 05/03/21 0147 05/04/21 0556  NA 138 132* 133*  K 5.4* 3.3* 4.2  CL 113* 101 97*  CO2 15* 21* 23  GLUCOSE 95 135* 143*  BUN 126* 81* 59*  CREATININE 18.49* 13.41* 10.10*  ALBUMIN 4.0 3.7 4.1  CALCIUM 8.3* 7.8* 8.5*  PHOS  --  3.8  --   AST 12*  --  13*  ALT 16  --  15   Liver Function Tests: Recent Labs  Lab 05/02/21 1144 05/03/21 0147 05/04/21 0556  AST 12*  --  13*  ALT 16  --  15  ALKPHOS 110  --  98  BILITOT 1.0  --  0.5  PROT 7.2   --  7.4  ALBUMIN 4.0 3.7 4.1    CBC: Recent Labs  Lab 05/02/21 1144 05/02/21 1437 05/03/21 0147 05/04/21 0556  WBC 4.0  --  3.4* 4.4  NEUTROABS 2.8  --   --  3.6  HGB 7.4*  --  7.0* 7.8*  HCT 24.4* 23.9* 22.8* 26.6*  MCV 83.6  --  82.3 81.8  PLT 117*  --  123* 151   CBG: Recent Labs  Lab 05/03/21 0737 05/03/21 1100 05/03/21 1616 05/03/21 2045 05/04/21 0756  GLUCAP 100* 172* 113* 271* 134*    Iron Studies:  Recent Labs    05/02/21 1437 05/03/21 0927  IRON 93  --   TIBC 200*  --   FERRITIN 591* 632*   Studies/Results: DG Chest Port 1 View  Result Date: 05/02/2021 CLINICAL DATA:  Edema, positive COVID test EXAM: PORTABLE CHEST 1 VIEW COMPARISON:  Chest radiograph 09/05/2020 FINDINGS: The heart is enlarged, unchanged. The mediastinal contours are stable. The pulmonary arteries are prominent. There are increased interstitial markings throughout both lungs consistent with vascular congestion. There may be mild pulmonary interstitial edema. There is blunting of the costophrenic angles which is unchanged going back to studies from 2020, favored  to reflect scarring. There is no right effusion. There is no pneumothorax There is no acute osseous abnormality. IMPRESSION: Cardiomegaly with vascular congestion and possible mild pulmonary interstitial edema. Electronically Signed   By: Valetta Mole M.D.   On: 05/02/2021 12:58    amLODipine  10 mg Oral Daily   calcium acetate  2,001 mg Oral TID WC   Chlorhexidine Gluconate Cloth  6 each Topical Q0600   darbepoetin (ARANESP) injection - DIALYSIS  60 mcg Intravenous Q Tue-HD   heparin  5,000 Units Subcutaneous Q8H   insulin aspart  0-5 Units Subcutaneous QHS   insulin aspart  0-9 Units Subcutaneous TID WC   labetalol  200 mg Oral TID   methylPREDNISolone (SOLU-MEDROL) injection  60 mg Intravenous Q12H   pantoprazole  40 mg Oral Daily    BMET    Component Value Date/Time   NA 133 (L) 05/04/2021 0556   K 4.2 05/04/2021 0556    CL 97 (L) 05/04/2021 0556   CO2 23 05/04/2021 0556   GLUCOSE 143 (H) 05/04/2021 0556   BUN 59 (H) 05/04/2021 0556   CREATININE 10.10 (H) 05/04/2021 0556   CALCIUM 8.5 (L) 05/04/2021 0556   GFRNONAA 5 (L) 05/04/2021 0556   GFRAA 14 (L) 08/08/2019 1623   CBC    Component Value Date/Time   WBC 4.4 05/04/2021 0556   RBC 3.25 (L) 05/04/2021 0556   HGB 7.8 (L) 05/04/2021 0556   HCT 26.6 (L) 05/04/2021 0556   HCT 23.9 (L) 05/02/2021 1437   PLT 151 05/04/2021 0556   MCV 81.8 05/04/2021 0556   MCH 24.0 (L) 05/04/2021 0556   MCHC 29.3 (L) 05/04/2021 0556   RDW 16.3 (H) 05/04/2021 0556   LYMPHSABS 0.5 (L) 05/04/2021 0556   MONOABS 0.3 05/04/2021 0556   EOSABS 0.0 05/04/2021 0556   BASOSABS 0.0 05/04/2021 0556    Dialysis Access: LUE AVF   Dialysis Orders: [facility]: [facility]  on Monday and Friday but since covid + [facility] . EDW 89kg HD [place] 2K/2.5Ca  Time 240 min Heparin 2000 units IV bolus, then 1000 units/hr. Access LUE AVF BFR 350 DFR 500    Calcitriol 2 mcg po/HD Epogen 8400  Units IV/HD  Venofer  50 mg IV/HD      Assessment/Plan:  Pulmonary edema - due to missed HD and volume overload.  Improved with HD  Hyperkalemia - due to missed HD, resolved with HD.  ESRD -  HD MF outpatient.  Recommend MWF schedule outpatient with covid.  Will need outpatient transportation for HD on Friday to get to his outpatient HD in [place].  Has been complicated as covid positive.  HD here tomorrow if still admitted though team states is to be discharged today  Hypertension/volume  - controlled; optimize volume with HD  Anemia  - missed ESA doses for 1.5 weeks prior to admission.  Aranesp given here on 9/20 at 60 mcg   Metabolic bone disease -  continue with home meds  Nutrition - renal, carb modified diet Disposition - will need SW assistance arranging for outpatient HD to [facility].  Dr. Marval Regal spoke with the covid unit [facility]) and they can take him on Friday if he can get  transportation, however his normal service wont take him due to covid +.  See SW note and appreciate her assistance.  Discussed he will need to take precautions around family  Claudia Desanctis, MD 05/04/2021 10:25 AM

## 2021-05-04 NOTE — TOC Transition Note (Signed)
Transition of Care [facility]) - CM/SW Discharge Note   Patient Details  Name: Alejandro Lee MRN: [id number] Date of Birth: May 17, 1961  Transition [facility]) CM/SW Contact:  Ihor Gully, LCSW Phone Number: 05/04/2021, 3:16 PM   Clinical Narrative:    Gwinda Passe at [facility] confirmed that patient has a chair at [facility] scheduled for tomorrow at 3 p.m. and he can return to [place] clinic on Monday.  Mrs. Manca confirms that she can take patient to [facility] on tomorrow.  Betsy at [facility] states that she will notify the [facility] clinic and [facility] that he will need transport on Monday.  TOC signing off.    Final next level of care: Home/Self Care Barriers to Discharge: No Barriers Identified   Patient Goals and CMS Choice Patient states their goals for this hospitalization and ongoing recovery are:: none      Discharge Placement                       Discharge Plan and Services                                     Social Determinants of Health (SDOH) Interventions     Readmission Risk Interventions No flowsheet data found.

## 2021-05-04 NOTE — Discharge Instructions (Signed)
1) You are strongly advised to isolate/quarantine for at least 10 days from the date of your diagnosis with COVID-19 infection--please always wear a mask if you have to go outside the house  2)You advised to get [place] or [place]  Covid Vaccine in about  2 to 3 months from now to reduce your chance of getting severe Covid 19 reinfection   3)Please take medications as prescribed  4)Video/Virtual follow-up visit with primary care physician in about a week advised  5)Please go to hemodialysis center in [facility] on Friday, 05/05/2021 around 3 PM for hemodialysis session--- -You may return to your usual hemodialysis unit on Monday, 05/08/2021  6)Avoid ibuprofen/Advil/Aleve/Motrin/[place] Powders/Naproxen/BC powders/Meloxicam/Diclofenac/Indomethacin and other Nonsteroidal anti-inflammatory medications as these will make you more likely to bleed and can cause stomach ulcers, can also cause Kidney problems.

## 2021-05-04 NOTE — Progress Notes (Signed)
Nsg Discharge Note  Admit Date:  05/02/2021 Discharge date: 05/04/2021   Alejandro Lee to be D/C'd Home per MD order.  AVS completed.  Copy for chart, and copy for patient signed, and dated. Patient/caregiver able to verbalize understanding. Removed IV-CDI. Reviewed d/c paperwork with patient and answered all questions. Wheeled stable patient and belongings to main entrance where he was picked up by his wife to d/c to home. Discharge Medication: Allergies as of 05/04/2021   No Known Allergies      Medication List     TAKE these medications    acetaminophen 500 MG tablet Commonly known as: TYLENOL Take 1,000 mg by mouth every 6 (six) hours as needed for moderate pain.   albuterol 108 (90 Base) MCG/ACT inhaler Commonly known as: VENTOLIN HFA Inhale 2 puffs into the lungs every 6 (six) hours as needed for wheezing or shortness of breath.   amLODipine 10 MG tablet Commonly known as: NORVASC Take 1 tablet (10 mg total) by mouth daily.   ascorbic acid 500 MG tablet Commonly known as: VITAMIN C Take 1 tablet (500 mg total) by mouth daily.   azaTHIOprine 50 MG tablet Commonly known as: IMURAN Take 1 tablet (50 mg total) by mouth daily.   calcium acetate 667 MG capsule Commonly known as: PHOSLO Take 3 capsules by mouth 3 (three) times daily.   guaiFENesin-dextromethorphan 100-10 MG/5ML syrup Commonly known as: ROBITUSSIN DM Take 10 mLs by mouth every 4 (four) hours as needed for cough.   labetalol 200 MG tablet Commonly known as: NORMODYNE Take 1 tablet (200 mg total) by mouth 3 (three) times daily.   Lagevrio 200 MG Caps capsule Generic drug: molnupiravir EUA Take 4 capsules by mouth 2 (two) times daily.   Lantus SoloStar 100 UNIT/ML Solostar Pen Generic drug: insulin glargine Inject 40 Units into the skin at bedtime. What changed: how much to take   NovoLOG FlexPen 100 UNIT/ML FlexPen Generic drug: insulin aspart Inject 8-14 Units into the skin 3 (three) times daily  before meals. What changed: how much to take   pantoprazole 40 MG tablet Commonly known as: PROTONIX Take 40 mg by mouth daily.   Tacrolimus ER 4 MG Tb24 Take 16 mg by mouth daily in the afternoon.   zinc sulfate 220 (50 Zn) MG capsule Take 1 capsule (220 mg total) by mouth daily.        Discharge Assessment: Vitals:   05/04/21 0947 05/04/21 1226  BP: (!) 128/52 (!) 131/55  Pulse: 70 71  Resp: 18 18  Temp:  98.6 F (37 C)  SpO2: 98% 99%   Skin clean, dry and intact without evidence of skin break down, no evidence of skin tears noted. IV catheter discontinued intact. Site without signs and symptoms of complications - no redness or edema noted at insertion site, patient denies c/o pain - only slight tenderness at site.  Dressing with slight pressure applied.  D/c Instructions-Education: Discharge instructions given to patient/family with verbalized understanding. D/c education completed with patient/family including follow up instructions, medication list, d/c activities limitations if indicated, with other d/c instructions as indicated by MD - patient able to verbalize understanding, all questions fully answered. Patient instructed to return to ED, call 911, or call MD for any changes in condition.  Patient escorted via [place], and D/C home via private auto.  Santa Lighter, RN 05/04/2021 4:16 PM

## 2021-05-04 NOTE — Discharge Summary (Signed)
Alejandro Lee, is a 60 y.o. male  DOB [birth year]  MRN [id number].  Admission date:  05/02/2021  Admitting Physician  Damita Lack, MD  Discharge Date:  05/04/2021   Primary MD  Monico Blitz, MD  Recommendations for primary care physician for things to follow:   1) You are strongly advised to isolate/quarantine for at least 10 days from the date of your diagnosis with COVID-19 infection--please always wear a mask if you have to go outside the house  2)You advised to get [place] or [place]  Covid Vaccine in about  2 to 3 months from now to reduce your chance of getting severe Covid 19 reinfection   3)Please take medications as prescribed  4)Video/Virtual follow-up visit with primary care physician in about a week advised  5)Please go to hemodialysis center in [facility] on Friday, 05/05/2021 around 3 PM for hemodialysis session--- -You may return to your usual hemodialysis unit on Monday, 05/08/2021  6)Avoid ibuprofen/Advil/Aleve/Motrin/Goody Powders/Naproxen/BC powders/Meloxicam/Diclofenac/Indomethacin and other Nonsteroidal anti-inflammatory medications as these will make you more likely to bleed and can cause stomach ulcers, can also cause Kidney problems.   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)   Hyperkalemia   Benign essential HTN   ESRD on hemodialysis (HCC)   Acute respiratory distress   Fluid overload       Past Medical History:  Diagnosis Date   Anemia    1 blood transfusion   Arthritis    Back pain    CHF (congestive heart failure) (HCC)    Chronic kidney disease    dialysis M/W/F   Diabetes mellitus without complication (HCC)    DVT (deep venous thrombosis) (HCC)    GERD (gastroesophageal reflux disease)    Headache    migraines   History of cardiovascular stress test 11/2017   [facility]) negative dobutamine stress test   Hyperlipidemia    Hypertension    Legally blind    right   Myocardial infarction [facility])    patient states it was seen on EKG, denies a cardiac cath    Past Surgical History:  Procedure Laterality Date   A/V FISTULAGRAM Right 07/22/2018   Procedure: A/V FISTULAGRAM;  Surgeon: Serafina Mitchell, MD;  Location: [place] [facility];  Service: Cardiovascular;  Laterality: Right;   AV FISTULA PLACEMENT Right    AV FISTULA PLACEMENT Right [id number]   Procedure: PLICATION OF RIGHT arm FISTULA;  Surgeon: Waynetta Sandy, MD;  Location: [place];  Service: Vascular;  Laterality: Right;   [place] Left 02/05/2019   Procedure: BRACHIOCEPHALIC FISTULA  LEFT ARM;  Surgeon: Harold Barban  W, MD;  Location: [place] OR;  Service: Vascular;  Laterality: Left;   COLONOSCOPY     diabetic cyst removal Bilateral    on buttocks x8   EYE SURGERY     FISTULA SUPERFICIALIZATION Right [id number]   Procedure: PLICATION OF ARTERIOVENOUS FISTULA RIGHT ARM;  Surgeon: Conrad Swainsboro, MD;  Location: [place];  Service: Vascular;  Laterality: Right;   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   lens replacement Left    LIGATION OF ARTERIOVENOUS  FISTULA  03/19/2019   Procedure: LIGATION OF ARTERIOVENOUS  OF COMPETING BRANCHES OF FISTULA LEFT UPPER ARM;  Surgeon: Serafina Mitchell, MD;  Location: [facility];  Service: Vascular;;   LIGATION OF ARTERIOVENOUS  FISTULA Right 05/07/2019   Procedure: LIGATION OF ARTERIOVENOUS  FISTULA RIGHT ARM;  Surgeon: Serafina Mitchell, MD;  Location: [facility]  OR;  Service: Vascular;  Laterality: Right;   NEPHRECTOMY TRANSPLANTED ORGAN     PERIPHERAL VASCULAR BALLOON ANGIOPLASTY  07/22/2018   Procedure: PERIPHERAL VASCULAR BALLOON ANGIOPLASTY;  Surgeon: Serafina Mitchell, MD;  Location: [place] [facility];  Service: Cardiovascular;;  Right [place] fistula    PERIPHERAL VASCULAR BALLOON ANGIOPLASTY Right 02/03/2019   Procedure: PERIPHERAL VASCULAR BALLOON ANGIOPLASTY;  Surgeon: Serafina Mitchell, MD;  Location: [place] [facility];  Service: Cardiovascular;  Laterality: Right;  RUE AVF   REFRACTIVE SURGERY Bilateral      HPI  from the history and physical done on the day of admission:    Chief Complaint: Shortness of breath   HPI: Alejandro Lee is a 60 y.o. male with medical history significant of DM2, HTN, ESRD HD 1 Monday Friday status post renal transplant at [facility] in November 2020, failed in July 2021.  He comes to the hospital with complains of shortness of breath, orthopnea.  Patient had some URI type symptoms about 10 days ago and got himself tested for COVID-19 on 04/26/2021.  He was tested positive and was prescribed Molnupiravir for 5 days.  Denies any other complaints including fevers chills at this time.  He typically takes cab For transportation to his HD but was refused therefore missed HD sessions.  The ED he was noted to have BUN 126, creatinine 2.4, hemoglobin 7.4.  Chest x-ray suggestive of vascular congestion and cardiomegaly.  He is being admitted to the hospital for hemodialysis.    Hospital Course:    Pulmonary edema/fluid overload -Secondary to missed dialysis -Appreciate nephrology follow-up - dialyzed as a new start to avoid disequilibrium -much improved after HD sessions   ESRD -Patient will need to have dialysis transportation arranged for [id number] -Metabolic bone disease per nephrology -Patient states that he no longer takes tacrolimus and Imuran   Hypertensive urgency -Improved -Continue amlodipine and  labetalol   COVID-19 infection -Finished 5-day course of molnupiravir -stable on RA   Anemia of CKD -patient has missed ESA for past 1-1/2 weeks -Aranesp per renal   Diabetes mellitus type 2, controlled -05/02/2021 hemoglobin A1c 5.9 -Patient states that he no longer takes Lantus   Hyperkalemia -Corrected per dialysis   Atypical Chest pain -personally reviewed EKG--sinus, no STT change No Chest pain -D-dimer   Dispo: The patient is from: Home              Anticipated d/c is to: Home  Discharge Condition: stable-  Follow UP--with PCP and Nephrologist   Consults obtained - Nephrology  Diet and Activity recommendation:  As advised  Discharge Instructions  Discharge Instructions     Call MD for:  difficulty breathing, headache or visual disturbances   Complete by: As directed    Call MD for:  persistant dizziness or light-headedness   Complete by: As directed    Call MD for:  persistant nausea and vomiting   Complete by: As directed    Call MD for:  severe uncontrolled pain   Complete by: As directed    Call MD for:  temperature >100.4   Complete by: As directed    Diet - low sodium heart healthy   Complete by: As directed    Discharge instructions   Complete by: As directed    1) You are strongly advised to isolate/quarantine for at least 10 days from the date of your diagnosis with COVID-19 infection--please always wear a mask if you have to go outside the house  2)You advised to get [place] or [place]  Covid Vaccine in about  2 to 3 months from now to reduce your chance of getting severe Covid 19 reinfection   3)Please take medications as prescribed  4)Video/Virtual follow-up visit with primary care physician in about a week advised  5)Please go to hemodialysis center in [facility] on Friday, 05/05/2021 around 3 PM for hemodialysis session--- -You may return to your usual hemodialysis unit on Monday, 05/08/2021  6)Avoid  ibuprofen/Advil/Aleve/Motrin/Goody Powders/Naproxen/BC powders/Meloxicam/Diclofenac/Indomethacin and other Nonsteroidal anti-inflammatory medications as these will make you more likely to bleed and can cause stomach ulcers, can also cause Kidney problems.   Increase activity slowly   Complete by: As directed         Discharge Medications     Allergies as of 05/04/2021   No Known Allergies      Medication List     TAKE these medications    acetaminophen 500 MG tablet Commonly known as: TYLENOL Take 1,000 mg by mouth every 6 (six) hours as needed for moderate pain.   albuterol 108 (90 Base) MCG/ACT inhaler Commonly known as: VENTOLIN HFA Inhale 2 puffs into the lungs every 6 (six) hours as needed for wheezing or shortness of breath.   amLODipine 10 MG tablet Commonly known as: NORVASC Take 1 tablet (10 mg total) by mouth daily.   ascorbic acid 500 MG tablet Commonly known as: VITAMIN C Take 1 tablet (500 mg total) by mouth daily.   azaTHIOprine 50 MG tablet Commonly known as: IMURAN Take 1 tablet (50 mg total) by mouth daily.   calcium acetate 667 MG capsule Commonly known as: PHOSLO Take 3 capsules by mouth 3 (three) times daily.   guaiFENesin-dextromethorphan 100-10 MG/5ML syrup Commonly known as: ROBITUSSIN DM Take 10 mLs by mouth every 4 (four) hours as needed for cough.   labetalol 200 MG tablet Commonly known as: NORMODYNE Take 1 tablet (200 mg total) by mouth 3 (three) times daily.   Lagevrio 200 MG Caps capsule Generic drug: molnupiravir EUA Take 4 capsules by mouth 2 (two) times daily.   Lantus SoloStar 100 UNIT/ML Solostar Pen Generic drug: insulin glargine Inject 40 Units into the skin at bedtime. What changed: how much to take   NovoLOG FlexPen 100 UNIT/ML FlexPen Generic drug: insulin aspart Inject 8-14 Units into the skin 3 (three) times daily before meals. What changed: how much to take   pantoprazole 40 MG tablet Commonly known as:  PROTONIX Take 40 mg by mouth daily.   Tacrolimus ER 4 MG Tb24 Take 16 mg by mouth daily in the afternoon.   zinc sulfate  220 (50 Zn) MG capsule Take 1 capsule (220 mg total) by mouth daily.        Major procedures and Radiology Reports - PLEASE review detailed and final reports for all details, in brief -   DG Chest Port 1 View  Result Date: 05/02/2021 CLINICAL DATA:  Edema, positive COVID test EXAM: PORTABLE CHEST 1 VIEW COMPARISON:  Chest radiograph 09/05/2020 FINDINGS: The heart is enlarged, unchanged. The mediastinal contours are stable. The pulmonary arteries are prominent. There are increased interstitial markings throughout both lungs consistent with vascular congestion. There may be mild pulmonary interstitial edema. There is blunting of the costophrenic angles which is unchanged going back to studies from 2020, favored to reflect scarring. There is no right effusion. There is no pneumothorax There is no acute osseous abnormality. IMPRESSION: Cardiomegaly with vascular congestion and possible mild pulmonary interstitial edema. Electronically Signed   By: Valetta Mole M.D.   On: 05/02/2021 12:58    Micro Results    Recent Results (from the past 240 hour(s))  Resp Panel by RT-PCR (Flu A&B, Covid) Nasopharyngeal Swab     Status: Abnormal   Collection Time: 05/02/21  1:54 PM   Specimen: Nasopharyngeal Swab; Nasopharyngeal(NP) swabs in vial transport medium  Result Value Ref Range Status   SARS Coronavirus 2 by RT PCR POSITIVE (A) NEGATIVE Final    Comment: CRITICAL RESULT CALLED TO, READ BACK BY AND VERIFIED WITH: LONG,L AT 1539 ON 9.20.22 BY RUCINSKI,B (NOTE) SARS-CoV-2 target nucleic acids are DETECTED.  The SARS-CoV-2 RNA is generally detectable in upper respiratory specimens during the acute phase of infection. Positive results are indicative of the presence of the identified virus, but do not rule out bacterial infection or co-infection with other pathogens not detected  by the test. Clinical correlation with patient history and other diagnostic information is necessary to determine patient infection status. The expected result is Negative.  Fact Sheet for Patients: EntrepreneurPulse.com.au  Fact Sheet for Healthcare Providers: IncredibleEmployment.be  This test is not yet approved or cleared by the [place] FDA and  has been authorized for detection and/or diagnosis of SARS-CoV-2 by FDA under an Emergency Use Authorization (EUA).  This EUA will remain in effect (meaning t his test can be used) for the duration of  the COVID-19 declaration under Section 564(b)(1) of the Act, 21 U.S.C. section 360bbb-3(b)(1), unless the authorization is terminated or revoked sooner.     Influenza A by PCR NEGATIVE NEGATIVE Final   Influenza B by PCR NEGATIVE NEGATIVE Final    Comment: (NOTE) The Xpert Xpress SARS-CoV-2/FLU/RSV plus assay is intended as an aid in the diagnosis of influenza from Nasopharyngeal swab specimens and should not be used as a sole basis for treatment. Nasal washings and aspirates are unacceptable for Xpert Xpress SARS-CoV-2/FLU/RSV testing.  Fact Sheet for Patients: EntrepreneurPulse.com.au  Fact Sheet for Healthcare Providers: IncredibleEmployment.be  This test is not yet approved or cleared by the [place] FDA and has been authorized for detection and/or diagnosis of SARS-CoV-2 by FDA under an Emergency Use Authorization (EUA). This EUA will remain in effect (meaning this test can be used) for the duration of the COVID-19 declaration under Section 564(b)(1) of the Act, 21 U.S.C. section 360bbb-3(b)(1), unless the authorization is terminated or revoked.  Performed at [facility], [address], [place], [place] [address]    Today   Subjective    Alejandro Lee today has no fevers  No Nausea, Vomiting or Diarrhea  Patient has been seen and  examined prior to discharge   Objective   Blood pressure (!) 131/55, pulse 71, temperature 98.6 F (37 C), temperature source Oral, resp. rate 18, height '5\' 7"'$  (1.702 m), weight 89.6 kg, SpO2 99 %.   Intake/Output Summary (Last 24 hours) at 05/04/2021 1535 Last data filed at 05/04/2021 1300 Gross per 24 hour  Intake 480 ml  Output 2045 ml  Net -1565 ml    Exam Gen:- Awake Alert, no acute distress  HEENT:- [place].AT, No sclera icterus Neck-Supple Neck,No JVD,.  Lungs-  CTAB , good air movement bilaterally  CV- S1, S2 normal, regular Abd-  +ve B.Sounds, Abd Soft, No tenderness,    Extremity/Skin:- No  edema,   good pulses Psych-affect is appropriate, oriented x3 Neuro-no new focal deficits, no tremors  MSK--Lt UE AVF   Data Review   CBC w Diff:  Lab Results  Component Value Date   WBC 4.4 05/04/2021   HGB 7.8 (L) 05/04/2021   HCT 26.6 (L) 05/04/2021   HCT 23.9 (L) 05/02/2021   PLT 151 05/04/2021   LYMPHOPCT 11 05/04/2021   MONOPCT 6 05/04/2021   EOSPCT 0 05/04/2021   BASOPCT 0 05/04/2021    CMP:  Lab Results  Component Value Date   NA 133 (L) 05/04/2021   K 4.2 05/04/2021   CL 97 (L) 05/04/2021   CO2 23 05/04/2021   BUN 59 (H) 05/04/2021   CREATININE 10.10 (H) 05/04/2021   PROT 7.4 05/04/2021   ALBUMIN 4.1 05/04/2021   BILITOT 0.5 05/04/2021   ALKPHOS 98 05/04/2021   AST 13 (L) 05/04/2021   ALT 15 05/04/2021  .   Total Discharge time is about 33 minutes  Roxan Hockey M.D on 05/04/2021 at 3:35 PM  Go to [email or web address] -  for contact info  [facility] - Office  [phone]

## 2021-05-04 NOTE — Plan of Care (Signed)
  Problem: Education: Goal: Knowledge of risk factors and measures for prevention of condition will improve Outcome: Progressing   Problem: Education: Goal: Knowledge of General Education information will improve Description: Including pain rating scale, medication(s)/side effects and non-pharmacologic comfort measures Outcome: Progressing   Problem: Clinical Measurements: Goal: Ability to maintain clinical measurements within normal limits will improve Outcome: Progressing

## 2021-05-12 DIAGNOSIS — I10 Essential (primary) hypertension: Secondary | ICD-10-CM | POA: Diagnosis not present

## 2021-05-12 DIAGNOSIS — N186 End stage renal disease: Secondary | ICD-10-CM | POA: Diagnosis not present

## 2021-05-12 DIAGNOSIS — Z992 Dependence on renal dialysis: Secondary | ICD-10-CM | POA: Diagnosis not present

## 2021-05-15 DIAGNOSIS — Z23 Encounter for immunization: Secondary | ICD-10-CM | POA: Diagnosis not present

## 2021-05-15 DIAGNOSIS — D631 Anemia in chronic kidney disease: Secondary | ICD-10-CM | POA: Diagnosis not present

## 2021-05-15 DIAGNOSIS — N2581 Secondary hyperparathyroidism of renal origin: Secondary | ICD-10-CM | POA: Diagnosis not present

## 2021-05-15 DIAGNOSIS — Z992 Dependence on renal dialysis: Secondary | ICD-10-CM | POA: Diagnosis not present

## 2021-05-15 DIAGNOSIS — N186 End stage renal disease: Secondary | ICD-10-CM | POA: Diagnosis not present

## 2021-05-15 DIAGNOSIS — D509 Iron deficiency anemia, unspecified: Secondary | ICD-10-CM | POA: Diagnosis not present

## 2021-05-22 DIAGNOSIS — Z992 Dependence on renal dialysis: Secondary | ICD-10-CM | POA: Diagnosis not present

## 2021-05-22 DIAGNOSIS — Z794 Long term (current) use of insulin: Secondary | ICD-10-CM | POA: Diagnosis not present

## 2021-05-22 DIAGNOSIS — E119 Type 2 diabetes mellitus without complications: Secondary | ICD-10-CM | POA: Diagnosis not present

## 2021-06-12 DIAGNOSIS — I10 Essential (primary) hypertension: Secondary | ICD-10-CM | POA: Diagnosis not present

## 2021-06-12 DIAGNOSIS — N186 End stage renal disease: Secondary | ICD-10-CM | POA: Diagnosis not present

## 2021-06-12 DIAGNOSIS — Z992 Dependence on renal dialysis: Secondary | ICD-10-CM | POA: Diagnosis not present

## 2021-06-16 DIAGNOSIS — D631 Anemia in chronic kidney disease: Secondary | ICD-10-CM | POA: Diagnosis not present

## 2021-06-16 DIAGNOSIS — N186 End stage renal disease: Secondary | ICD-10-CM | POA: Diagnosis not present

## 2021-06-16 DIAGNOSIS — Z992 Dependence on renal dialysis: Secondary | ICD-10-CM | POA: Diagnosis not present

## 2021-06-16 DIAGNOSIS — D509 Iron deficiency anemia, unspecified: Secondary | ICD-10-CM | POA: Diagnosis not present

## 2021-06-16 DIAGNOSIS — N2581 Secondary hyperparathyroidism of renal origin: Secondary | ICD-10-CM | POA: Diagnosis not present

## 2021-06-24 ENCOUNTER — Emergency Department (HOSPITAL_COMMUNITY)
Admission: EM | Admit: 2021-06-24 | Discharge: 2021-06-24 | Disposition: A | Discharge status: Home / Self Care | Payer: Medicare Other | Attending: Emergency Medicine | Admitting: Emergency Medicine

## 2021-06-24 ENCOUNTER — Emergency Department (HOSPITAL_COMMUNITY): Payer: Medicare Other

## 2021-06-24 ENCOUNTER — Encounter (HOSPITAL_COMMUNITY): Payer: Self-pay

## 2021-06-24 ENCOUNTER — Other Ambulatory Visit: Payer: Self-pay

## 2021-06-24 DIAGNOSIS — Z8616 Personal history of COVID-19: Secondary | ICD-10-CM | POA: Insufficient documentation

## 2021-06-24 DIAGNOSIS — Z20822 Contact with and (suspected) exposure to covid-19: Secondary | ICD-10-CM | POA: Diagnosis not present

## 2021-06-24 DIAGNOSIS — I5032 Chronic diastolic (congestive) heart failure: Secondary | ICD-10-CM | POA: Diagnosis not present

## 2021-06-24 DIAGNOSIS — Z87891 Personal history of nicotine dependence: Secondary | ICD-10-CM | POA: Insufficient documentation

## 2021-06-24 DIAGNOSIS — B9789 Other viral agents as the cause of diseases classified elsewhere: Secondary | ICD-10-CM | POA: Diagnosis not present

## 2021-06-24 DIAGNOSIS — E1122 Type 2 diabetes mellitus with diabetic chronic kidney disease: Secondary | ICD-10-CM | POA: Diagnosis not present

## 2021-06-24 DIAGNOSIS — Z79899 Other long term (current) drug therapy: Secondary | ICD-10-CM | POA: Insufficient documentation

## 2021-06-24 DIAGNOSIS — J069 Acute upper respiratory infection, unspecified: Secondary | ICD-10-CM | POA: Insufficient documentation

## 2021-06-24 DIAGNOSIS — Z794 Long term (current) use of insulin: Secondary | ICD-10-CM | POA: Insufficient documentation

## 2021-06-24 DIAGNOSIS — N186 End stage renal disease: Secondary | ICD-10-CM | POA: Diagnosis not present

## 2021-06-24 DIAGNOSIS — I10 Essential (primary) hypertension: Secondary | ICD-10-CM | POA: Diagnosis not present

## 2021-06-24 DIAGNOSIS — R059 Cough, unspecified: Secondary | ICD-10-CM | POA: Diagnosis not present

## 2021-06-24 DIAGNOSIS — I517 Cardiomegaly: Secondary | ICD-10-CM | POA: Diagnosis not present

## 2021-06-24 DIAGNOSIS — Z992 Dependence on renal dialysis: Secondary | ICD-10-CM | POA: Insufficient documentation

## 2021-06-24 DIAGNOSIS — I132 Hypertensive heart and chronic kidney disease with heart failure and with stage 5 chronic kidney disease, or end stage renal disease: Secondary | ICD-10-CM | POA: Insufficient documentation

## 2021-06-24 LAB — COMPREHENSIVE METABOLIC PANEL
ALT: 15 U/L (ref 0–44)
AST: 18 U/L (ref 15–41)
Albumin: 3.5 g/dL (ref 3.5–5.0)
Alkaline Phosphatase: 90 U/L (ref 38–126)
Anion gap: 12 (ref 5–15)
BUN: 50 mg/dL — ABNORMAL HIGH (ref 6–20)
CO2: 25 mmol/L (ref 22–32)
Calcium: 8.2 mg/dL — ABNORMAL LOW (ref 8.9–10.3)
Chloride: 101 mmol/L (ref 98–111)
Creatinine, Ser: 10.82 mg/dL — ABNORMAL HIGH (ref 0.61–1.24)
GFR, Estimated: 5 mL/min — ABNORMAL LOW (ref >60)
Glucose, Bld: 142 mg/dL — ABNORMAL HIGH (ref 70–99)
Potassium: 4.2 mmol/L (ref 3.5–5.1)
Sodium: 138 mmol/L (ref 135–145)
Total Bilirubin: 0.7 mg/dL (ref 0.3–1.2)
Total Protein: 6.9 g/dL (ref 6.5–8.1)

## 2021-06-24 LAB — RESP PANEL BY RT-PCR (FLU A&B, COVID) ARPGX2
Influenza A by PCR: NEGATIVE
Influenza B by PCR: NEGATIVE
SARS Coronavirus 2 by RT PCR: NEGATIVE

## 2021-06-24 LAB — CBC WITH DIFFERENTIAL/PLATELET
Abs Immature Granulocytes: 0.01 10*3/uL (ref 0.00–0.07)
Basophils Absolute: 0 10*3/uL (ref 0.0–0.1)
Basophils Relative: 1 %
Eosinophils Absolute: 0.6 10*3/uL — ABNORMAL HIGH (ref 0.0–0.5)
Eosinophils Relative: 11 %
HCT: 29.1 % — ABNORMAL LOW (ref 39.0–52.0)
Hemoglobin: 8.6 g/dL — ABNORMAL LOW (ref 13.0–17.0)
Immature Granulocytes: 0 %
Lymphocytes Relative: 20 %
Lymphs Abs: 1 10*3/uL (ref 0.7–4.0)
MCH: 24.9 pg — ABNORMAL LOW (ref 26.0–34.0)
MCHC: 29.6 g/dL — ABNORMAL LOW (ref 30.0–36.0)
MCV: 84.3 fL (ref 80.0–100.0)
Monocytes Absolute: 0.4 10*3/uL (ref 0.1–1.0)
Monocytes Relative: 8 %
Neutro Abs: 3.2 10*3/uL (ref 1.7–7.7)
Neutrophils Relative %: 60 %
Platelets: 145 10*3/uL — ABNORMAL LOW (ref 150–400)
RBC: 3.45 MIL/uL — ABNORMAL LOW (ref 4.22–5.81)
RDW: 16.3 % — ABNORMAL HIGH (ref 11.5–15.5)
WBC: 5.2 10*3/uL (ref 4.0–10.5)
nRBC: 0 % (ref 0.0–0.2)

## 2021-06-24 LAB — MAGNESIUM: Magnesium: 2.3 mg/dL (ref 1.7–2.4)

## 2021-06-24 MED ORDER — ALBUTEROL SULFATE HFA 108 (90 BASE) MCG/ACT IN AERS
3.0000 | INHALATION_SPRAY | Freq: Once | RESPIRATORY_TRACT | Status: AC
  Administered 2021-06-24: 3 via RESPIRATORY_TRACT
  Filled 2021-06-24: qty 6.7

## 2021-06-24 NOTE — Discharge Instructions (Signed)
Lab work and imaging are all reassuring.  I recommend Tylenol as needed for pain control as well as fever control.  I have given you a albuterol inhaler please use as needed for wheezing, you may take 1 to 2 puffs every 4-6 hours.  For your cough I recommend cough suppressant like Mucinex with Robaxin   Please call your PCP for further evaluation.  Come back to the emergency department if you develop chest pain, shortness of breath, severe abdominal pain, uncontrolled nausea, vomiting, diarrhea.

## 2021-06-24 NOTE — ED Triage Notes (Signed)
Left side pain for 3 days from coughing.  Does not cough anything up.  Family is sick in home.   [facility] dialysis has not missed any apts.

## 2021-06-24 NOTE — ED Provider Notes (Signed)
[facility] EMERGENCY DEPARTMENT Provider Note   CSN: [id number] Arrival date & time: 06/24/21  1953     History Chief Complaint  Patient presents with   Abdominal Pain   Cough    Alejandro Lee is a 60 y.o. male.  HPI  Patient with significant medical history including end-stage renal disease with dialysis Monday Wednesday Friday, type 2 diabetes, CHF presents with chief complaint of URI-like symptoms.  Patient symptoms started about 3 days ago,  endorsing nasal congestion, nonproductive cough, nausea without vomiting, left-sided side pain with coughing and constant diarrhea.  He denies hematemesis or melena, denies fevers, chills, general body aches.  States that family members have been diagnosed with the flu and COVID, and feels  he is coming down with a viral illness.  he states he is vaccine against COVID and influenza, he denies  pleuritic chest pain, worsening shortness of breath, orthopnea, worsening peripheral edema.  Patient states he has not missed any of his dialysis treatments, states he is still tolerating p.o., patient states that his left-sided flank pain is only when he coughs, he has no pain while at rest, or when he is not coughing, pain does not radiate, remains there in his side.  Thinks this is just from coughing.  Has no complaints this time.  Past Medical History:  Diagnosis Date   Anemia    1 blood transfusion   Arthritis    Back pain    CHF (congestive heart failure) (HCC)    Chronic kidney disease    dialysis M/W/F   Diabetes mellitus without complication (HCC)    DVT (deep venous thrombosis) (HCC)    GERD (gastroesophageal reflux disease)    Headache    migraines   History of cardiovascular stress test 11/2017   [facility]) negative dobutamine stress test   Hyperlipidemia    Hypertension    Legally blind    right   Myocardial infarction [facility])    patient states it was seen on EKG, denies a cardiac cath    Patient Active Problem List   Diagnosis  Date Noted   Acute respiratory distress 05/02/2021   COVID-19 virus infection 05/02/2021   Fluid overload 05/02/2021   Pneumonia due to COVID-19 virus 09/05/2020   ESRD on hemodialysis ([place])    Hyperkalemia 06/25/2018   Non-compliance with renal dialysis ([place]) 06/25/2018   Generalized abdominal pain 06/25/2018   Chronic back pain 06/25/2018   GERD (gastroesophageal reflux disease) 06/25/2018   Chronic diastolic HF (heart failure) ([place]) 06/25/2018   Class 1 obesity due to excess calories in adult 06/25/2018   Benign essential HTN 06/25/2018   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/2017   Diabetes mellitus with end-stage renal disease ([place]) 12/19/2017   ESRD (end stage renal disease) on dialysis ([place]) 12/19/2017   Chronic right-sided low back pain without sciatica 12/19/2017   Pseudophakia of left eye 09/03/2017   PDR (proliferative diabetic retinopathy) ([place]) 09/03/2017   Cataract in degenerative disorder 09/03/2017   Unspecified complication of internal prosthetic device, implant and graft, initial encounter 05/17/2017   Rectal abscess 05/17/2017   Osteopathy in diseases classified elsewhere, unspecified site 62/95/2841   Metabolic disorder 32/44/0102   Hypertension 05/16/2017   Dependence on renal dialysis ([place]) 05/16/2017   Anemia of chronic renal failure, stage 5 ([place]) 05/16/2017   Retinal detachment, tractional, both eyes 06/19/2012    Past Surgical History:  Procedure Laterality Date   A/V FISTULAGRAM Right 07/22/2018   Procedure: A/V FISTULAGRAM;  Surgeon:  Serafina Mitchell, MD;  Location: [place] [facility];  Service: Cardiovascular;  Laterality: Right;   AV FISTULA PLACEMENT Right    AV FISTULA PLACEMENT Right 82/99/3716   Procedure: PLICATION OF RIGHT arm FISTULA;  Surgeon: Waynetta Sandy, MD;  Location: [place];  Service: Vascular;  Laterality: Right;   Axis Left 02/05/2019   Procedure: BRACHIOCEPHALIC FISTULA  LEFT ARM;   Surgeon: Serafina Mitchell, MD;  Location: [facility];  Service: Vascular;  Laterality: Left;   COLONOSCOPY     diabetic cyst removal Bilateral    on buttocks x8   EYE SURGERY     FISTULA SUPERFICIALIZATION Right 9/67/8938   Procedure: PLICATION OF ARTERIOVENOUS FISTULA RIGHT ARM;  Surgeon: Conrad , MD;  Location: [place];  Service: Vascular;  Laterality: Right;   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   lens replacement Left    LIGATION OF ARTERIOVENOUS  FISTULA  03/19/2019   Procedure: LIGATION OF ARTERIOVENOUS  OF COMPETING BRANCHES OF FISTULA LEFT UPPER ARM;  Surgeon: Serafina Mitchell, MD;  Location: [facility];  Service: Vascular;;   LIGATION OF ARTERIOVENOUS  FISTULA Right 05/07/2019   Procedure: LIGATION OF ARTERIOVENOUS  FISTULA RIGHT ARM;  Surgeon: Serafina Mitchell, MD;  Location: [facility];  Service: Vascular;  Laterality: Right;   NEPHRECTOMY TRANSPLANTED ORGAN     PERIPHERAL VASCULAR BALLOON ANGIOPLASTY  07/22/2018   Procedure: PERIPHERAL VASCULAR BALLOON ANGIOPLASTY;  Surgeon: Serafina Mitchell, MD;  Location: [place] [facility];  Service: Cardiovascular;;  Right [place] fistula    PERIPHERAL VASCULAR BALLOON ANGIOPLASTY Right 02/03/2019   Procedure: PERIPHERAL VASCULAR BALLOON ANGIOPLASTY;  Surgeon: Serafina Mitchell, MD;  Location: [place] [facility];  Service: Cardiovascular;  Laterality: Right;  RUE AVF   REFRACTIVE SURGERY Bilateral        Family History  Problem Relation Age of Onset   Diabetes Mother    Hypertension Mother    Hyperlipidemia Mother    Diabetes Father    Heart disease Father    Cancer Father    Hypertension Father    Hyperlipidemia Father    Stroke Father    Diabetes Sister    Hypertension Sister    Diabetes Brother    Hypertension Brother    Cancer Brother     Social History   Tobacco Use   Smoking status: Former    Types: Cigarettes   Smokeless tobacco: Never  Vaping Use   Vaping Use: Never used  Substance Use Topics   Alcohol use: Not Currently    Drug use: Never    Home Medications Prior to Admission medications   Medication Sig Start Date End Date Taking? Authorizing Provider  acetaminophen (TYLENOL) 500 MG tablet Take 1,000 mg by mouth every 6 (six) hours as needed for moderate pain.    [provider]  albuterol (VENTOLIN HFA) 108 (90 Base) MCG/ACT inhaler Inhale 2 puffs into the lungs every 6 (six) hours as needed for wheezing or shortness of breath.    [provider]  amLODipine (NORVASC) 10 MG tablet Take 1 tablet (10 mg total) by mouth daily. 09/07/20   Roxan Hockey, MD  ascorbic acid (VITAMIN C) 500 MG tablet Take 1 tablet (500 mg total) by mouth daily. 09/08/20   Roxan Hockey, MD  azaTHIOprine (IMURAN) 50 MG tablet Take 1 tablet (50 mg total) by mouth daily. 09/07/20   Roxan Hockey, MD  calcium acetate (PHOSLO) 667 MG capsule Take 3 capsules  by mouth 3 (three) times daily. 03/29/21   [provider]  guaiFENesin-dextromethorphan (ROBITUSSIN DM) 100-10 MG/5ML syrup Take 10 mLs by mouth every 4 (four) hours as needed for cough. 09/07/20   Roxan Hockey, MD  labetalol (NORMODYNE) 200 MG tablet Take 1 tablet (200 mg total) by mouth 3 (three) times daily. 09/07/20   Roxan Hockey, MD  LAGEVRIO 200 MG CAPS capsule Take 4 capsules by mouth 2 (two) times daily. 04/27/21   [provider]  LANTUS SOLOSTAR 100 UNIT/ML Solostar Pen Inject 40 Units into the skin at bedtime. Patient taking differently: Inject 30 Units into the skin at bedtime. 08/09/20   Cassandria Anger, MD  NOVOLOG FLEXPEN 100 UNIT/ML FlexPen Inject 8-14 Units into the skin 3 (three) times daily before meals. Patient taking differently: Inject 8-10 Units into the skin 3 (three) times daily before meals. 08/09/20   Cassandria Anger, MD  pantoprazole (PROTONIX) 40 MG tablet Take 40 mg by mouth daily. 07/01/19   [provider]  Tacrolimus ER 4 MG TB24 Take 16 mg by mouth daily in the afternoon. 09/07/20    Roxan Hockey, MD  zinc sulfate 220 (50 Zn) MG capsule Take 1 capsule (220 mg total) by mouth daily. 09/08/20   Roxan Hockey, MD    Allergies    Patient has no known allergies.  Review of Systems   Review of Systems  Constitutional:  Negative for chills and fever.  HENT:  Positive for congestion. Negative for sore throat.   Respiratory:  Positive for cough and shortness of breath.   Cardiovascular:  Negative for chest pain.  Gastrointestinal:  Positive for diarrhea. Negative for abdominal pain, nausea and vomiting.  Genitourinary:  Negative for enuresis.  Musculoskeletal:  Negative for back pain.  Skin:  Negative for rash.  Neurological:  Negative for dizziness.  Hematological:  Does not bruise/bleed easily.   Physical Exam Updated Vital Signs BP (!) 124/43   Pulse 65   Temp 98.6 F (37 C) (Oral)   Resp 19   Ht 5\' 7"  (1.702 m)   Wt 89.4 kg   SpO2 99%   BMI 30.85 kg/m   Physical Exam Vitals and nursing note reviewed.  Constitutional:      General: He is not in acute distress.    Appearance: He is not ill-appearing.  HENT:     Head: Normocephalic and atraumatic.     Nose: No congestion.     Mouth/Throat:     Mouth: Mucous membranes are moist.     Pharynx: Oropharynx is clear.  Eyes:     Conjunctiva/sclera: Conjunctivae normal.  Cardiovascular:     Rate and Rhythm: Normal rate and regular rhythm.     Pulses: Normal pulses.     Heart sounds: No murmur heard.   No friction rub. No gallop.  Pulmonary:     Effort: No respiratory distress.     Breath sounds: Wheezing present. No rhonchi or rales.     Comments: Patient shows no signs respiratory distress, nonhypoxic, nontachypneic, able to speak in full sentences.  Patient is noted wheezing heard in the lower lobes bilaterally, no rales or rhonchi present. Abdominal:     Palpations: Abdomen is soft.     Tenderness: There is no abdominal tenderness. There is no right CVA tenderness or left CVA tenderness.   Musculoskeletal:     Right lower leg: No edema.     Left lower leg: No edema.  Skin:    General: Skin is  warm and dry.     Comments: Patient has an AC fistula on his left arm, good palpable thrill.  Neurological:     Mental Status: He is alert.  Psychiatric:        Mood and Affect: Mood normal.    ED Results / Procedures / Treatments   Labs (all labs ordered are listed, but only abnormal results are displayed) Labs Reviewed  COMPREHENSIVE METABOLIC PANEL - Abnormal; Notable for the following components:      Result Value   Glucose, Bld 142 (*)    BUN 50 (*)    Creatinine, Ser 10.82 (*)    Calcium 8.2 (*)    GFR, Estimated 5 (*)    All other components within normal limits  CBC WITH DIFFERENTIAL/PLATELET - Abnormal; Notable for the following components:   RBC 3.45 (*)    Hemoglobin 8.6 (*)    HCT 29.1 (*)    MCH 24.9 (*)    MCHC 29.6 (*)    RDW 16.3 (*)    Platelets 145 (*)    Eosinophils Absolute 0.6 (*)    All other components within normal limits  RESP PANEL BY RT-PCR (FLU A&B, COVID) [id number]  MAGNESIUM    EKG EKG Interpretation  Date/Time:  Saturday June 24 2021 21:15:36 EST Ventricular Rate:  67 PR Interval:  189 QRS Duration: 96 QT Interval:  461 QTC Calculation: 487 R Axis:   -54 Text Interpretation: Sinus rhythm Left anterior fascicular block Borderline low voltage, extremity leads Abnormal R-wave progression, late transition Borderline prolonged QT interval Confirmed by Noemi Chapel [phone]) on 06/24/2021 9:43:54 PM  Radiology DG Chest Port 1 View  Result Date: 06/24/2021 CLINICAL DATA:  Cough. EXAM: PORTABLE CHEST 1 VIEW COMPARISON:  Chest x-ray 05/02/2021. FINDINGS: The heart is enlarged, unchanged. There is no focal lung consolidation, pleural effusion or pneumothorax. No acute fractures are seen. IMPRESSION: 1. Stable cardiomegaly. 2. No other acute cardiopulmonary process. Electronically Signed   By: Ronney Asters M.D.   On: 06/24/2021 21:28     Procedures Procedures   Medications Ordered in ED Medications  albuterol (VENTOLIN HFA) 108 (90 Base) MCG/ACT inhaler 3 puff (has no administration in time range)    ED Course  I have reviewed the triage vital signs and the nursing notes.  Pertinent labs & imaging results that were available during my care of the patient were reviewed by me and considered in my medical decision making (see chart for details).    MDM Rules/Calculators/A&P                          Initial impression-presents with URI-like symptoms.  He is alert, does not appear acute stress, vital signs reassuring.  Likely viral infection.  Will obtain basic lab work-up to rule out electrolyte derailments chest x-ray and reassess.  Work-up-CBC shows normocytic anemia hemoglobin 8.6 above his baseline, CMP shows glucose of 142, BUN of 58 at baseline, creatinine 10.82, calcium 8.2, magnesium 2.3.  Respiratory panel negative. chest x-ray negative for acute findings.  EKG sinus without signs of ischemia.  Rule out-low suspicion for systemic infection as patient nontoxic-appearing, vital signs are reassuring, no leukocytosis present.  I have low suspicion for pneumonia as chest x-ray is unremarkable, no leukocytosis, no rhonchi present my exam.  I have low suspicion for ACS as presentation atypical etiology, EKG sinus without signs of ischemia.  I have low suspicion for PE as he is nonhypoxic, nontachypneic, presentation atypical  etiology.  I have low suspicion for emergent hemodialysis as he is not volume overloaded, no severe electrolyte derailments present.  I have low suspicion for kidney stone or [place] as he has no CVA tenderness, he does have left-sided pain but this is only when he is coughing which is inconsistent of a kidney stone.  Plan-  URI-suspecting suffering from a viral infection, will recommend he continue with over-the-counter pain medications, provided him with an inhaler to use as needed for wheezing  follow-up with PCP for further evaluation.  Given strict return precautions.  Vital signs have remained stable, no indication for hospital admission.   Patient given at home care as well strict return precautions.  Patient verbalized that they understood agreed to said plan.  Final Clinical Impression(s) / ED Diagnoses Final diagnoses:  Viral URI with cough    Rx / DC Orders ED Discharge Orders     None        Aron Baba 06/24/21 2312    Noemi Chapel, MD 06/25/21 1505

## 2021-06-26 DIAGNOSIS — Z992 Dependence on renal dialysis: Secondary | ICD-10-CM | POA: Diagnosis not present

## 2021-07-12 DIAGNOSIS — N186 End stage renal disease: Secondary | ICD-10-CM | POA: Diagnosis not present

## 2021-07-12 DIAGNOSIS — Z992 Dependence on renal dialysis: Secondary | ICD-10-CM | POA: Diagnosis not present

## 2021-07-12 DIAGNOSIS — I10 Essential (primary) hypertension: Secondary | ICD-10-CM | POA: Diagnosis not present

## 2021-07-14 DIAGNOSIS — Z992 Dependence on renal dialysis: Secondary | ICD-10-CM | POA: Diagnosis not present

## 2021-07-14 DIAGNOSIS — N186 End stage renal disease: Secondary | ICD-10-CM | POA: Diagnosis not present

## 2021-07-14 DIAGNOSIS — D509 Iron deficiency anemia, unspecified: Secondary | ICD-10-CM | POA: Diagnosis not present

## 2021-07-14 DIAGNOSIS — N2581 Secondary hyperparathyroidism of renal origin: Secondary | ICD-10-CM | POA: Diagnosis not present

## 2021-07-14 DIAGNOSIS — D631 Anemia in chronic kidney disease: Secondary | ICD-10-CM | POA: Diagnosis not present

## 2021-07-24 DIAGNOSIS — Z992 Dependence on renal dialysis: Secondary | ICD-10-CM | POA: Diagnosis not present

## 2021-08-11 DIAGNOSIS — I10 Essential (primary) hypertension: Secondary | ICD-10-CM | POA: Diagnosis not present

## 2021-08-12 DIAGNOSIS — N186 End stage renal disease: Secondary | ICD-10-CM | POA: Diagnosis not present

## 2021-08-12 DIAGNOSIS — Z992 Dependence on renal dialysis: Secondary | ICD-10-CM | POA: Diagnosis not present

## 2021-08-14 DIAGNOSIS — D509 Iron deficiency anemia, unspecified: Secondary | ICD-10-CM | POA: Diagnosis not present

## 2021-08-14 DIAGNOSIS — N186 End stage renal disease: Secondary | ICD-10-CM | POA: Diagnosis not present

## 2021-08-14 DIAGNOSIS — Z992 Dependence on renal dialysis: Secondary | ICD-10-CM | POA: Diagnosis not present

## 2021-08-14 DIAGNOSIS — N2581 Secondary hyperparathyroidism of renal origin: Secondary | ICD-10-CM | POA: Diagnosis not present

## 2021-08-14 DIAGNOSIS — D631 Anemia in chronic kidney disease: Secondary | ICD-10-CM | POA: Diagnosis not present

## 2021-08-18 DIAGNOSIS — N2581 Secondary hyperparathyroidism of renal origin: Secondary | ICD-10-CM | POA: Diagnosis not present

## 2021-08-18 DIAGNOSIS — N186 End stage renal disease: Secondary | ICD-10-CM | POA: Diagnosis not present

## 2021-08-18 DIAGNOSIS — D631 Anemia in chronic kidney disease: Secondary | ICD-10-CM | POA: Diagnosis not present

## 2021-08-18 DIAGNOSIS — D509 Iron deficiency anemia, unspecified: Secondary | ICD-10-CM | POA: Diagnosis not present

## 2021-08-18 DIAGNOSIS — Z992 Dependence on renal dialysis: Secondary | ICD-10-CM | POA: Diagnosis not present

## 2021-08-21 DIAGNOSIS — Z794 Long term (current) use of insulin: Secondary | ICD-10-CM | POA: Diagnosis not present

## 2021-08-21 DIAGNOSIS — E119 Type 2 diabetes mellitus without complications: Secondary | ICD-10-CM | POA: Diagnosis not present

## 2021-08-21 DIAGNOSIS — Z992 Dependence on renal dialysis: Secondary | ICD-10-CM | POA: Diagnosis not present

## 2021-08-21 DIAGNOSIS — N186 End stage renal disease: Secondary | ICD-10-CM | POA: Diagnosis not present

## 2021-08-21 DIAGNOSIS — N2581 Secondary hyperparathyroidism of renal origin: Secondary | ICD-10-CM | POA: Diagnosis not present

## 2021-08-21 DIAGNOSIS — D509 Iron deficiency anemia, unspecified: Secondary | ICD-10-CM | POA: Diagnosis not present

## 2021-08-21 DIAGNOSIS — D631 Anemia in chronic kidney disease: Secondary | ICD-10-CM | POA: Diagnosis not present

## 2021-08-25 DIAGNOSIS — D631 Anemia in chronic kidney disease: Secondary | ICD-10-CM | POA: Diagnosis not present

## 2021-08-25 DIAGNOSIS — D509 Iron deficiency anemia, unspecified: Secondary | ICD-10-CM | POA: Diagnosis not present

## 2021-08-25 DIAGNOSIS — N186 End stage renal disease: Secondary | ICD-10-CM | POA: Diagnosis not present

## 2021-08-25 DIAGNOSIS — N2581 Secondary hyperparathyroidism of renal origin: Secondary | ICD-10-CM | POA: Diagnosis not present

## 2021-08-25 DIAGNOSIS — Z992 Dependence on renal dialysis: Secondary | ICD-10-CM | POA: Diagnosis not present

## 2021-08-28 DIAGNOSIS — N2581 Secondary hyperparathyroidism of renal origin: Secondary | ICD-10-CM | POA: Diagnosis not present

## 2021-08-28 DIAGNOSIS — D509 Iron deficiency anemia, unspecified: Secondary | ICD-10-CM | POA: Diagnosis not present

## 2021-08-28 DIAGNOSIS — N186 End stage renal disease: Secondary | ICD-10-CM | POA: Diagnosis not present

## 2021-08-28 DIAGNOSIS — Z992 Dependence on renal dialysis: Secondary | ICD-10-CM | POA: Diagnosis not present

## 2021-08-28 DIAGNOSIS — D631 Anemia in chronic kidney disease: Secondary | ICD-10-CM | POA: Diagnosis not present

## 2021-09-01 DIAGNOSIS — D509 Iron deficiency anemia, unspecified: Secondary | ICD-10-CM | POA: Diagnosis not present

## 2021-09-01 DIAGNOSIS — Z992 Dependence on renal dialysis: Secondary | ICD-10-CM | POA: Diagnosis not present

## 2021-09-01 DIAGNOSIS — N2581 Secondary hyperparathyroidism of renal origin: Secondary | ICD-10-CM | POA: Diagnosis not present

## 2021-09-01 DIAGNOSIS — D631 Anemia in chronic kidney disease: Secondary | ICD-10-CM | POA: Diagnosis not present

## 2021-09-01 DIAGNOSIS — N186 End stage renal disease: Secondary | ICD-10-CM | POA: Diagnosis not present

## 2021-09-04 DIAGNOSIS — N2581 Secondary hyperparathyroidism of renal origin: Secondary | ICD-10-CM | POA: Diagnosis not present

## 2021-09-04 DIAGNOSIS — Z992 Dependence on renal dialysis: Secondary | ICD-10-CM | POA: Diagnosis not present

## 2021-09-04 DIAGNOSIS — D509 Iron deficiency anemia, unspecified: Secondary | ICD-10-CM | POA: Diagnosis not present

## 2021-09-04 DIAGNOSIS — N186 End stage renal disease: Secondary | ICD-10-CM | POA: Diagnosis not present

## 2021-09-04 DIAGNOSIS — D631 Anemia in chronic kidney disease: Secondary | ICD-10-CM | POA: Diagnosis not present

## 2021-09-06 DIAGNOSIS — N186 End stage renal disease: Secondary | ICD-10-CM | POA: Diagnosis not present

## 2021-09-06 DIAGNOSIS — N2581 Secondary hyperparathyroidism of renal origin: Secondary | ICD-10-CM | POA: Diagnosis not present

## 2021-09-06 DIAGNOSIS — D509 Iron deficiency anemia, unspecified: Secondary | ICD-10-CM | POA: Diagnosis not present

## 2021-09-06 DIAGNOSIS — D631 Anemia in chronic kidney disease: Secondary | ICD-10-CM | POA: Diagnosis not present

## 2021-09-06 DIAGNOSIS — Z992 Dependence on renal dialysis: Secondary | ICD-10-CM | POA: Diagnosis not present

## 2021-09-08 DIAGNOSIS — D509 Iron deficiency anemia, unspecified: Secondary | ICD-10-CM | POA: Diagnosis not present

## 2021-09-08 DIAGNOSIS — N186 End stage renal disease: Secondary | ICD-10-CM | POA: Diagnosis not present

## 2021-09-08 DIAGNOSIS — N2581 Secondary hyperparathyroidism of renal origin: Secondary | ICD-10-CM | POA: Diagnosis not present

## 2021-09-08 DIAGNOSIS — D631 Anemia in chronic kidney disease: Secondary | ICD-10-CM | POA: Diagnosis not present

## 2021-09-08 DIAGNOSIS — Z992 Dependence on renal dialysis: Secondary | ICD-10-CM | POA: Diagnosis not present

## 2021-09-11 DIAGNOSIS — Z992 Dependence on renal dialysis: Secondary | ICD-10-CM | POA: Diagnosis not present

## 2021-09-11 DIAGNOSIS — D509 Iron deficiency anemia, unspecified: Secondary | ICD-10-CM | POA: Diagnosis not present

## 2021-09-11 DIAGNOSIS — N186 End stage renal disease: Secondary | ICD-10-CM | POA: Diagnosis not present

## 2021-09-11 DIAGNOSIS — N2581 Secondary hyperparathyroidism of renal origin: Secondary | ICD-10-CM | POA: Diagnosis not present

## 2021-09-11 DIAGNOSIS — D631 Anemia in chronic kidney disease: Secondary | ICD-10-CM | POA: Diagnosis not present

## 2021-09-12 DIAGNOSIS — N186 End stage renal disease: Secondary | ICD-10-CM | POA: Diagnosis not present

## 2021-09-12 DIAGNOSIS — Z992 Dependence on renal dialysis: Secondary | ICD-10-CM | POA: Diagnosis not present

## 2021-09-13 DIAGNOSIS — N186 End stage renal disease: Secondary | ICD-10-CM | POA: Diagnosis not present

## 2021-09-13 DIAGNOSIS — D509 Iron deficiency anemia, unspecified: Secondary | ICD-10-CM | POA: Diagnosis not present

## 2021-09-13 DIAGNOSIS — Z992 Dependence on renal dialysis: Secondary | ICD-10-CM | POA: Diagnosis not present

## 2021-09-13 DIAGNOSIS — D631 Anemia in chronic kidney disease: Secondary | ICD-10-CM | POA: Diagnosis not present

## 2021-09-13 DIAGNOSIS — N2581 Secondary hyperparathyroidism of renal origin: Secondary | ICD-10-CM | POA: Diagnosis not present

## 2021-09-15 DIAGNOSIS — D509 Iron deficiency anemia, unspecified: Secondary | ICD-10-CM | POA: Diagnosis not present

## 2021-09-15 DIAGNOSIS — N2581 Secondary hyperparathyroidism of renal origin: Secondary | ICD-10-CM | POA: Diagnosis not present

## 2021-09-15 DIAGNOSIS — N186 End stage renal disease: Secondary | ICD-10-CM | POA: Diagnosis not present

## 2021-09-15 DIAGNOSIS — D631 Anemia in chronic kidney disease: Secondary | ICD-10-CM | POA: Diagnosis not present

## 2021-09-15 DIAGNOSIS — Z992 Dependence on renal dialysis: Secondary | ICD-10-CM | POA: Diagnosis not present

## 2021-09-18 DIAGNOSIS — N2581 Secondary hyperparathyroidism of renal origin: Secondary | ICD-10-CM | POA: Diagnosis not present

## 2021-09-18 DIAGNOSIS — Z992 Dependence on renal dialysis: Secondary | ICD-10-CM | POA: Diagnosis not present

## 2021-09-18 DIAGNOSIS — D509 Iron deficiency anemia, unspecified: Secondary | ICD-10-CM | POA: Diagnosis not present

## 2021-09-18 DIAGNOSIS — D631 Anemia in chronic kidney disease: Secondary | ICD-10-CM | POA: Diagnosis not present

## 2021-09-18 DIAGNOSIS — N186 End stage renal disease: Secondary | ICD-10-CM | POA: Diagnosis not present

## 2021-09-20 DIAGNOSIS — D509 Iron deficiency anemia, unspecified: Secondary | ICD-10-CM | POA: Diagnosis not present

## 2021-09-20 DIAGNOSIS — N186 End stage renal disease: Secondary | ICD-10-CM | POA: Diagnosis not present

## 2021-09-20 DIAGNOSIS — Z992 Dependence on renal dialysis: Secondary | ICD-10-CM | POA: Diagnosis not present

## 2021-09-20 DIAGNOSIS — N2581 Secondary hyperparathyroidism of renal origin: Secondary | ICD-10-CM | POA: Diagnosis not present

## 2021-09-20 DIAGNOSIS — D631 Anemia in chronic kidney disease: Secondary | ICD-10-CM | POA: Diagnosis not present

## 2021-09-22 DIAGNOSIS — D631 Anemia in chronic kidney disease: Secondary | ICD-10-CM | POA: Diagnosis not present

## 2021-09-22 DIAGNOSIS — N186 End stage renal disease: Secondary | ICD-10-CM | POA: Diagnosis not present

## 2021-09-22 DIAGNOSIS — Z992 Dependence on renal dialysis: Secondary | ICD-10-CM | POA: Diagnosis not present

## 2021-09-22 DIAGNOSIS — D509 Iron deficiency anemia, unspecified: Secondary | ICD-10-CM | POA: Diagnosis not present

## 2021-09-22 DIAGNOSIS — N2581 Secondary hyperparathyroidism of renal origin: Secondary | ICD-10-CM | POA: Diagnosis not present

## 2021-09-25 DIAGNOSIS — N2581 Secondary hyperparathyroidism of renal origin: Secondary | ICD-10-CM | POA: Diagnosis not present

## 2021-09-25 DIAGNOSIS — D509 Iron deficiency anemia, unspecified: Secondary | ICD-10-CM | POA: Diagnosis not present

## 2021-09-25 DIAGNOSIS — N186 End stage renal disease: Secondary | ICD-10-CM | POA: Diagnosis not present

## 2021-09-25 DIAGNOSIS — D631 Anemia in chronic kidney disease: Secondary | ICD-10-CM | POA: Diagnosis not present

## 2021-09-25 DIAGNOSIS — Z992 Dependence on renal dialysis: Secondary | ICD-10-CM | POA: Diagnosis not present

## 2021-09-27 DIAGNOSIS — N2581 Secondary hyperparathyroidism of renal origin: Secondary | ICD-10-CM | POA: Diagnosis not present

## 2021-09-27 DIAGNOSIS — Z992 Dependence on renal dialysis: Secondary | ICD-10-CM | POA: Diagnosis not present

## 2021-09-27 DIAGNOSIS — D631 Anemia in chronic kidney disease: Secondary | ICD-10-CM | POA: Diagnosis not present

## 2021-09-27 DIAGNOSIS — D509 Iron deficiency anemia, unspecified: Secondary | ICD-10-CM | POA: Diagnosis not present

## 2021-09-27 DIAGNOSIS — N186 End stage renal disease: Secondary | ICD-10-CM | POA: Diagnosis not present

## 2021-09-29 DIAGNOSIS — D631 Anemia in chronic kidney disease: Secondary | ICD-10-CM | POA: Diagnosis not present

## 2021-09-29 DIAGNOSIS — N186 End stage renal disease: Secondary | ICD-10-CM | POA: Diagnosis not present

## 2021-09-29 DIAGNOSIS — D509 Iron deficiency anemia, unspecified: Secondary | ICD-10-CM | POA: Diagnosis not present

## 2021-09-29 DIAGNOSIS — N2581 Secondary hyperparathyroidism of renal origin: Secondary | ICD-10-CM | POA: Diagnosis not present

## 2021-09-29 DIAGNOSIS — Z992 Dependence on renal dialysis: Secondary | ICD-10-CM | POA: Diagnosis not present

## 2021-10-02 DIAGNOSIS — D509 Iron deficiency anemia, unspecified: Secondary | ICD-10-CM | POA: Diagnosis not present

## 2021-10-02 DIAGNOSIS — N186 End stage renal disease: Secondary | ICD-10-CM | POA: Diagnosis not present

## 2021-10-02 DIAGNOSIS — D631 Anemia in chronic kidney disease: Secondary | ICD-10-CM | POA: Diagnosis not present

## 2021-10-02 DIAGNOSIS — N2581 Secondary hyperparathyroidism of renal origin: Secondary | ICD-10-CM | POA: Diagnosis not present

## 2021-10-02 DIAGNOSIS — Z992 Dependence on renal dialysis: Secondary | ICD-10-CM | POA: Diagnosis not present

## 2021-10-04 DIAGNOSIS — D509 Iron deficiency anemia, unspecified: Secondary | ICD-10-CM | POA: Diagnosis not present

## 2021-10-04 DIAGNOSIS — N2581 Secondary hyperparathyroidism of renal origin: Secondary | ICD-10-CM | POA: Diagnosis not present

## 2021-10-04 DIAGNOSIS — D631 Anemia in chronic kidney disease: Secondary | ICD-10-CM | POA: Diagnosis not present

## 2021-10-04 DIAGNOSIS — Z992 Dependence on renal dialysis: Secondary | ICD-10-CM | POA: Diagnosis not present

## 2021-10-04 DIAGNOSIS — N186 End stage renal disease: Secondary | ICD-10-CM | POA: Diagnosis not present

## 2021-10-09 DIAGNOSIS — N2581 Secondary hyperparathyroidism of renal origin: Secondary | ICD-10-CM | POA: Diagnosis not present

## 2021-10-09 DIAGNOSIS — D631 Anemia in chronic kidney disease: Secondary | ICD-10-CM | POA: Diagnosis not present

## 2021-10-09 DIAGNOSIS — D509 Iron deficiency anemia, unspecified: Secondary | ICD-10-CM | POA: Diagnosis not present

## 2021-10-09 DIAGNOSIS — Z992 Dependence on renal dialysis: Secondary | ICD-10-CM | POA: Diagnosis not present

## 2021-10-09 DIAGNOSIS — N186 End stage renal disease: Secondary | ICD-10-CM | POA: Diagnosis not present

## 2021-10-10 DIAGNOSIS — N186 End stage renal disease: Secondary | ICD-10-CM | POA: Diagnosis not present

## 2021-10-10 DIAGNOSIS — Z992 Dependence on renal dialysis: Secondary | ICD-10-CM | POA: Diagnosis not present

## 2021-10-11 DIAGNOSIS — Z992 Dependence on renal dialysis: Secondary | ICD-10-CM | POA: Diagnosis not present

## 2021-10-11 DIAGNOSIS — N186 End stage renal disease: Secondary | ICD-10-CM | POA: Diagnosis not present

## 2021-10-13 DIAGNOSIS — N186 End stage renal disease: Secondary | ICD-10-CM | POA: Diagnosis not present

## 2021-10-13 DIAGNOSIS — Z992 Dependence on renal dialysis: Secondary | ICD-10-CM | POA: Diagnosis not present

## 2021-10-16 DIAGNOSIS — N186 End stage renal disease: Secondary | ICD-10-CM | POA: Diagnosis not present

## 2021-10-16 DIAGNOSIS — Z992 Dependence on renal dialysis: Secondary | ICD-10-CM | POA: Diagnosis not present

## 2021-10-18 DIAGNOSIS — Z992 Dependence on renal dialysis: Secondary | ICD-10-CM | POA: Diagnosis not present

## 2021-10-18 DIAGNOSIS — N186 End stage renal disease: Secondary | ICD-10-CM | POA: Diagnosis not present

## 2021-10-20 DIAGNOSIS — Z992 Dependence on renal dialysis: Secondary | ICD-10-CM | POA: Diagnosis not present

## 2021-10-20 DIAGNOSIS — N186 End stage renal disease: Secondary | ICD-10-CM | POA: Diagnosis not present

## 2021-10-23 DIAGNOSIS — Z992 Dependence on renal dialysis: Secondary | ICD-10-CM | POA: Diagnosis not present

## 2021-10-23 DIAGNOSIS — N186 End stage renal disease: Secondary | ICD-10-CM | POA: Diagnosis not present

## 2021-10-25 DIAGNOSIS — N186 End stage renal disease: Secondary | ICD-10-CM | POA: Diagnosis not present

## 2021-10-25 DIAGNOSIS — Z992 Dependence on renal dialysis: Secondary | ICD-10-CM | POA: Diagnosis not present

## 2021-10-30 DIAGNOSIS — N186 End stage renal disease: Secondary | ICD-10-CM | POA: Diagnosis not present

## 2021-10-30 DIAGNOSIS — Z992 Dependence on renal dialysis: Secondary | ICD-10-CM | POA: Diagnosis not present

## 2021-11-01 DIAGNOSIS — N186 End stage renal disease: Secondary | ICD-10-CM | POA: Diagnosis not present

## 2021-11-01 DIAGNOSIS — Z992 Dependence on renal dialysis: Secondary | ICD-10-CM | POA: Diagnosis not present

## 2021-11-03 DIAGNOSIS — Z992 Dependence on renal dialysis: Secondary | ICD-10-CM | POA: Diagnosis not present

## 2021-11-03 DIAGNOSIS — N186 End stage renal disease: Secondary | ICD-10-CM | POA: Diagnosis not present

## 2021-11-06 DIAGNOSIS — Z992 Dependence on renal dialysis: Secondary | ICD-10-CM | POA: Diagnosis not present

## 2021-11-06 DIAGNOSIS — N186 End stage renal disease: Secondary | ICD-10-CM | POA: Diagnosis not present

## 2021-11-08 DIAGNOSIS — Z992 Dependence on renal dialysis: Secondary | ICD-10-CM | POA: Diagnosis not present

## 2021-11-08 DIAGNOSIS — N186 End stage renal disease: Secondary | ICD-10-CM | POA: Diagnosis not present

## 2021-11-10 DIAGNOSIS — Z992 Dependence on renal dialysis: Secondary | ICD-10-CM | POA: Diagnosis not present

## 2021-11-10 DIAGNOSIS — N186 End stage renal disease: Secondary | ICD-10-CM | POA: Diagnosis not present

## 2021-11-13 DIAGNOSIS — N186 End stage renal disease: Secondary | ICD-10-CM | POA: Diagnosis not present

## 2021-11-13 DIAGNOSIS — Z992 Dependence on renal dialysis: Secondary | ICD-10-CM | POA: Diagnosis not present

## 2021-11-17 DIAGNOSIS — Z992 Dependence on renal dialysis: Secondary | ICD-10-CM | POA: Diagnosis not present

## 2021-11-17 DIAGNOSIS — N186 End stage renal disease: Secondary | ICD-10-CM | POA: Diagnosis not present

## 2021-11-20 DIAGNOSIS — Z992 Dependence on renal dialysis: Secondary | ICD-10-CM | POA: Diagnosis not present

## 2021-11-20 DIAGNOSIS — E1122 Type 2 diabetes mellitus with diabetic chronic kidney disease: Secondary | ICD-10-CM | POA: Diagnosis not present

## 2021-11-20 DIAGNOSIS — N186 End stage renal disease: Secondary | ICD-10-CM | POA: Diagnosis not present

## 2021-11-20 DIAGNOSIS — Z794 Long term (current) use of insulin: Secondary | ICD-10-CM | POA: Diagnosis not present

## 2021-11-22 DIAGNOSIS — N186 End stage renal disease: Secondary | ICD-10-CM | POA: Diagnosis not present

## 2021-11-22 DIAGNOSIS — Z992 Dependence on renal dialysis: Secondary | ICD-10-CM | POA: Diagnosis not present

## 2021-11-24 DIAGNOSIS — Z992 Dependence on renal dialysis: Secondary | ICD-10-CM | POA: Diagnosis not present

## 2021-11-24 DIAGNOSIS — N186 End stage renal disease: Secondary | ICD-10-CM | POA: Diagnosis not present

## 2021-11-27 DIAGNOSIS — N186 End stage renal disease: Secondary | ICD-10-CM | POA: Diagnosis not present

## 2021-11-27 DIAGNOSIS — Z992 Dependence on renal dialysis: Secondary | ICD-10-CM | POA: Diagnosis not present

## 2021-12-01 DIAGNOSIS — Z992 Dependence on renal dialysis: Secondary | ICD-10-CM | POA: Diagnosis not present

## 2021-12-01 DIAGNOSIS — N186 End stage renal disease: Secondary | ICD-10-CM | POA: Diagnosis not present

## 2021-12-06 DIAGNOSIS — Z992 Dependence on renal dialysis: Secondary | ICD-10-CM | POA: Diagnosis not present

## 2021-12-06 DIAGNOSIS — Z1159 Encounter for screening for other viral diseases: Secondary | ICD-10-CM | POA: Diagnosis not present

## 2021-12-06 DIAGNOSIS — N186 End stage renal disease: Secondary | ICD-10-CM | POA: Diagnosis not present

## 2021-12-10 DIAGNOSIS — N186 End stage renal disease: Secondary | ICD-10-CM | POA: Diagnosis not present

## 2021-12-10 DIAGNOSIS — Z992 Dependence on renal dialysis: Secondary | ICD-10-CM | POA: Diagnosis not present

## 2021-12-12 DIAGNOSIS — N2581 Secondary hyperparathyroidism of renal origin: Secondary | ICD-10-CM | POA: Diagnosis not present

## 2021-12-12 DIAGNOSIS — N186 End stage renal disease: Secondary | ICD-10-CM | POA: Diagnosis not present

## 2021-12-12 DIAGNOSIS — Z992 Dependence on renal dialysis: Secondary | ICD-10-CM | POA: Diagnosis not present

## 2021-12-14 DIAGNOSIS — Z992 Dependence on renal dialysis: Secondary | ICD-10-CM | POA: Diagnosis not present

## 2021-12-14 DIAGNOSIS — N186 End stage renal disease: Secondary | ICD-10-CM | POA: Diagnosis not present

## 2021-12-14 DIAGNOSIS — N2581 Secondary hyperparathyroidism of renal origin: Secondary | ICD-10-CM | POA: Diagnosis not present

## 2021-12-14 DIAGNOSIS — Z13220 Encounter for screening for lipoid disorders: Secondary | ICD-10-CM | POA: Diagnosis not present

## 2021-12-16 DIAGNOSIS — Z992 Dependence on renal dialysis: Secondary | ICD-10-CM | POA: Diagnosis not present

## 2021-12-16 DIAGNOSIS — N2581 Secondary hyperparathyroidism of renal origin: Secondary | ICD-10-CM | POA: Diagnosis not present

## 2021-12-16 DIAGNOSIS — N186 End stage renal disease: Secondary | ICD-10-CM | POA: Diagnosis not present

## 2021-12-19 DIAGNOSIS — N2581 Secondary hyperparathyroidism of renal origin: Secondary | ICD-10-CM | POA: Diagnosis not present

## 2021-12-19 DIAGNOSIS — Z992 Dependence on renal dialysis: Secondary | ICD-10-CM | POA: Diagnosis not present

## 2021-12-19 DIAGNOSIS — N186 End stage renal disease: Secondary | ICD-10-CM | POA: Diagnosis not present

## 2021-12-23 DIAGNOSIS — N186 End stage renal disease: Secondary | ICD-10-CM | POA: Diagnosis not present

## 2021-12-23 DIAGNOSIS — Z992 Dependence on renal dialysis: Secondary | ICD-10-CM | POA: Diagnosis not present

## 2021-12-23 DIAGNOSIS — N2581 Secondary hyperparathyroidism of renal origin: Secondary | ICD-10-CM | POA: Diagnosis not present

## 2021-12-26 DIAGNOSIS — N186 End stage renal disease: Secondary | ICD-10-CM | POA: Diagnosis not present

## 2021-12-26 DIAGNOSIS — Z992 Dependence on renal dialysis: Secondary | ICD-10-CM | POA: Diagnosis not present

## 2021-12-26 DIAGNOSIS — N2581 Secondary hyperparathyroidism of renal origin: Secondary | ICD-10-CM | POA: Diagnosis not present

## 2021-12-28 DIAGNOSIS — Z992 Dependence on renal dialysis: Secondary | ICD-10-CM | POA: Diagnosis not present

## 2021-12-28 DIAGNOSIS — N186 End stage renal disease: Secondary | ICD-10-CM | POA: Diagnosis not present

## 2021-12-28 DIAGNOSIS — N2581 Secondary hyperparathyroidism of renal origin: Secondary | ICD-10-CM | POA: Diagnosis not present

## 2022-01-02 DIAGNOSIS — N186 End stage renal disease: Secondary | ICD-10-CM | POA: Diagnosis not present

## 2022-01-02 DIAGNOSIS — Z992 Dependence on renal dialysis: Secondary | ICD-10-CM | POA: Diagnosis not present

## 2022-01-02 DIAGNOSIS — N2581 Secondary hyperparathyroidism of renal origin: Secondary | ICD-10-CM | POA: Diagnosis not present

## 2022-01-04 DIAGNOSIS — N186 End stage renal disease: Secondary | ICD-10-CM | POA: Diagnosis not present

## 2022-01-04 DIAGNOSIS — N2581 Secondary hyperparathyroidism of renal origin: Secondary | ICD-10-CM | POA: Diagnosis not present

## 2022-01-04 DIAGNOSIS — Z992 Dependence on renal dialysis: Secondary | ICD-10-CM | POA: Diagnosis not present

## 2022-01-09 DIAGNOSIS — N2581 Secondary hyperparathyroidism of renal origin: Secondary | ICD-10-CM | POA: Diagnosis not present

## 2022-01-09 DIAGNOSIS — Z992 Dependence on renal dialysis: Secondary | ICD-10-CM | POA: Diagnosis not present

## 2022-01-09 DIAGNOSIS — N186 End stage renal disease: Secondary | ICD-10-CM | POA: Diagnosis not present

## 2022-01-10 DIAGNOSIS — N186 End stage renal disease: Secondary | ICD-10-CM | POA: Diagnosis not present

## 2022-12-12 DEATH — deceased
